# Patient Record
Sex: Female | Born: 1970
Health system: Southern US, Community
[De-identification: ages and names within clinical notes are randomized; demographics above are authoritative.]

## PROBLEM LIST (undated history)

## (undated) DIAGNOSIS — K5900 Constipation, unspecified: Secondary | ICD-10-CM

## (undated) DIAGNOSIS — M4846XA Fatigue fracture of vertebra, lumbar region, initial encounter for fracture: Secondary | ICD-10-CM

## (undated) DIAGNOSIS — I959 Hypotension, unspecified: Secondary | ICD-10-CM

## (undated) DIAGNOSIS — F419 Anxiety disorder, unspecified: Secondary | ICD-10-CM

## (undated) DIAGNOSIS — M1990 Unspecified osteoarthritis, unspecified site: Secondary | ICD-10-CM

## (undated) DIAGNOSIS — R32 Unspecified urinary incontinence: Secondary | ICD-10-CM

## (undated) DIAGNOSIS — F32A Depression, unspecified: Secondary | ICD-10-CM

## (undated) DIAGNOSIS — E669 Obesity, unspecified: Secondary | ICD-10-CM

## (undated) DIAGNOSIS — M719 Bursopathy, unspecified: Secondary | ICD-10-CM

## (undated) DIAGNOSIS — R600 Localized edema: Secondary | ICD-10-CM

## (undated) DIAGNOSIS — F329 Major depressive disorder, single episode, unspecified: Secondary | ICD-10-CM

## (undated) DIAGNOSIS — M810 Age-related osteoporosis without current pathological fracture: Secondary | ICD-10-CM

## (undated) DIAGNOSIS — R0602 Shortness of breath: Secondary | ICD-10-CM

## (undated) DIAGNOSIS — E785 Hyperlipidemia, unspecified: Secondary | ICD-10-CM

## (undated) DIAGNOSIS — K760 Fatty (change of) liver, not elsewhere classified: Secondary | ICD-10-CM

## (undated) DIAGNOSIS — R739 Hyperglycemia, unspecified: Secondary | ICD-10-CM

## (undated) HISTORY — DX: Bursopathy, unspecified: M71.9

## (undated) HISTORY — DX: Hyperlipidemia, unspecified: E78.5

## (undated) HISTORY — DX: Unspecified urinary incontinence: R32

## (undated) HISTORY — DX: Fatigue fracture of vertebra, lumbar region, initial encounter for fracture: M48.46XA

## (undated) HISTORY — DX: Unspecified osteoarthritis, unspecified site: M19.90

## (undated) HISTORY — DX: Obesity, unspecified: E66.9

## (undated) HISTORY — DX: Shortness of breath: R06.02

## (undated) HISTORY — DX: Depression, unspecified: F32.A

## (undated) HISTORY — DX: Hypotension, unspecified: I95.9

## (undated) HISTORY — DX: Constipation, unspecified: K59.00

## (undated) HISTORY — PX: COLONOSCOPY: SHX174

## (undated) HISTORY — DX: Localized edema: R60.0

## (undated) HISTORY — PX: TONSILLECTOMY: SUR1361

## (undated) HISTORY — PX: SINUS ENDO W/FUSION: SHX777

## (undated) HISTORY — DX: Fatty (change of) liver, not elsewhere classified: K76.0

## (undated) HISTORY — DX: Hyperglycemia, unspecified: R73.9

## (undated) HISTORY — DX: Major depressive disorder, single episode, unspecified: F32.9

## (undated) HISTORY — PX: RHINOPLASTY: SUR1284

---

## 1999-09-04 ENCOUNTER — Encounter: Admission: RE | Admit: 1999-09-04 | Discharge: 1999-12-03 | Payer: Self-pay | Admitting: Family Medicine

## 1999-12-08 ENCOUNTER — Encounter: Admission: RE | Admit: 1999-12-08 | Discharge: 2000-03-07 | Payer: Self-pay | Admitting: Family Medicine

## 2001-07-14 ENCOUNTER — Other Ambulatory Visit: Admission: RE | Admit: 2001-07-14 | Discharge: 2001-07-14 | Payer: Self-pay | Admitting: Family Medicine

## 2002-03-05 ENCOUNTER — Inpatient Hospital Stay (HOSPITAL_COMMUNITY): Admission: AD | Admit: 2002-03-05 | Discharge: 2002-03-05 | Payer: Self-pay | Admitting: Obstetrics and Gynecology

## 2002-03-14 ENCOUNTER — Inpatient Hospital Stay (HOSPITAL_COMMUNITY): Admission: AD | Admit: 2002-03-14 | Discharge: 2002-03-14 | Payer: Self-pay | Admitting: Obstetrics and Gynecology

## 2002-03-22 ENCOUNTER — Inpatient Hospital Stay (HOSPITAL_COMMUNITY): Admission: AD | Admit: 2002-03-22 | Discharge: 2002-03-22 | Payer: Self-pay | Admitting: Obstetrics and Gynecology

## 2002-10-08 ENCOUNTER — Inpatient Hospital Stay (HOSPITAL_COMMUNITY): Admission: AD | Admit: 2002-10-08 | Discharge: 2002-10-10 | Payer: Self-pay | Admitting: Obstetrics and Gynecology

## 2002-11-19 ENCOUNTER — Other Ambulatory Visit: Admission: RE | Admit: 2002-11-19 | Discharge: 2002-11-19 | Payer: Self-pay | Admitting: Obstetrics and Gynecology

## 2004-04-03 ENCOUNTER — Other Ambulatory Visit: Admission: RE | Admit: 2004-04-03 | Discharge: 2004-04-03 | Payer: Self-pay | Admitting: Family Medicine

## 2004-04-03 ENCOUNTER — Ambulatory Visit: Payer: Self-pay | Admitting: Family Medicine

## 2004-05-15 ENCOUNTER — Ambulatory Visit (HOSPITAL_BASED_OUTPATIENT_CLINIC_OR_DEPARTMENT_OTHER): Admission: RE | Admit: 2004-05-15 | Discharge: 2004-05-15 | Payer: Self-pay | Admitting: Family Medicine

## 2004-05-15 ENCOUNTER — Ambulatory Visit: Payer: Self-pay | Admitting: Pulmonary Disease

## 2004-05-29 ENCOUNTER — Ambulatory Visit: Payer: Self-pay | Admitting: Family Medicine

## 2004-08-28 ENCOUNTER — Ambulatory Visit: Payer: Self-pay | Admitting: Family Medicine

## 2004-11-27 ENCOUNTER — Ambulatory Visit: Payer: Self-pay | Admitting: Family Medicine

## 2004-12-29 ENCOUNTER — Ambulatory Visit: Payer: Self-pay | Admitting: Family Medicine

## 2005-02-15 ENCOUNTER — Ambulatory Visit: Payer: Self-pay | Admitting: Family Medicine

## 2005-04-07 ENCOUNTER — Ambulatory Visit: Payer: Self-pay | Admitting: Family Medicine

## 2005-04-07 ENCOUNTER — Encounter: Payer: Self-pay | Admitting: Family Medicine

## 2005-04-07 ENCOUNTER — Other Ambulatory Visit: Admission: RE | Admit: 2005-04-07 | Discharge: 2005-04-07 | Payer: Self-pay | Admitting: Family Medicine

## 2005-05-14 ENCOUNTER — Ambulatory Visit: Payer: Self-pay | Admitting: Family Medicine

## 2005-08-13 ENCOUNTER — Ambulatory Visit: Payer: Self-pay | Admitting: Family Medicine

## 2005-10-19 ENCOUNTER — Ambulatory Visit: Payer: Self-pay | Admitting: Family Medicine

## 2006-07-14 ENCOUNTER — Other Ambulatory Visit: Admission: RE | Admit: 2006-07-14 | Discharge: 2006-07-14 | Payer: Self-pay | Admitting: Family Medicine

## 2006-07-14 ENCOUNTER — Ambulatory Visit: Payer: Self-pay | Admitting: Family Medicine

## 2006-07-14 ENCOUNTER — Encounter: Payer: Self-pay | Admitting: Family Medicine

## 2006-07-14 LAB — CONVERTED CEMR LAB
ALT: 22 units/L (ref 0–40)
AST: 23 units/L (ref 0–37)
Albumin: 3.8 g/dL (ref 3.5–5.2)
Alkaline Phosphatase: 51 units/L (ref 39–117)
BUN: 18 mg/dL (ref 6–23)
Basophils Absolute: 0 10*3/uL (ref 0.0–0.1)
Basophils Relative: 0.5 % (ref 0.0–1.0)
Bilirubin, Direct: 0.1 mg/dL (ref 0.0–0.3)
CO2: 30 meq/L (ref 19–32)
Calcium: 8.6 mg/dL (ref 8.4–10.5)
Chloride: 109 meq/L (ref 96–112)
Cholesterol: 215 mg/dL (ref 0–200)
Creatinine, Ser: 0.7 mg/dL (ref 0.4–1.2)
Direct LDL: 130.8 mg/dL
Eosinophils Absolute: 0.1 10*3/uL (ref 0.0–0.6)
Eosinophils Relative: 3.3 % (ref 0.0–5.0)
GFR calc Af Amer: 121 mL/min
GFR calc non Af Amer: 100 mL/min
Glucose, Bld: 80 mg/dL (ref 70–99)
HCT: 38.4 % (ref 36.0–46.0)
HDL: 65.5 mg/dL (ref >39.0)
Hemoglobin: 13.1 g/dL (ref 12.0–15.0)
Lymphocytes Relative: 40.8 % (ref 12.0–46.0)
MCHC: 34 g/dL (ref 30.0–36.0)
MCV: 86 fL (ref 78.0–100.0)
Monocytes Absolute: 0.2 10*3/uL (ref 0.2–0.7)
Monocytes Relative: 8.8 % (ref 3.0–11.0)
Neutro Abs: 1.3 10*3/uL — ABNORMAL LOW (ref 1.4–7.7)
Neutrophils Relative %: 46.6 % (ref 43.0–77.0)
Platelets: 233 10*3/uL (ref 150–400)
Potassium: 4 meq/L (ref 3.5–5.1)
RBC: 4.46 M/uL (ref 3.87–5.11)
RDW: 14.3 % (ref 11.5–14.6)
Sodium: 143 meq/L (ref 135–145)
TSH: 1.55 microintl units/mL (ref 0.35–5.50)
Total Bilirubin: 0.6 mg/dL (ref 0.3–1.2)
Total CHOL/HDL Ratio: 3.3
Total Protein: 6 g/dL (ref 6.0–8.3)
Triglycerides: 60 mg/dL (ref 0–149)
VLDL: 12 mg/dL (ref 0–40)
WBC: 2.7 10*3/uL — ABNORMAL LOW (ref 4.5–10.5)

## 2006-08-08 ENCOUNTER — Ambulatory Visit: Payer: Self-pay | Admitting: Family Medicine

## 2006-08-23 DIAGNOSIS — Z9889 Other specified postprocedural states: Secondary | ICD-10-CM | POA: Insufficient documentation

## 2006-08-23 DIAGNOSIS — G2581 Restless legs syndrome: Secondary | ICD-10-CM | POA: Insufficient documentation

## 2006-08-23 DIAGNOSIS — Z9089 Acquired absence of other organs: Secondary | ICD-10-CM | POA: Insufficient documentation

## 2006-08-23 DIAGNOSIS — F509 Eating disorder, unspecified: Secondary | ICD-10-CM | POA: Insufficient documentation

## 2006-11-02 ENCOUNTER — Ambulatory Visit: Payer: Self-pay | Admitting: Family Medicine

## 2007-02-16 ENCOUNTER — Ambulatory Visit: Payer: Self-pay | Admitting: Family Medicine

## 2007-02-16 DIAGNOSIS — J019 Acute sinusitis, unspecified: Secondary | ICD-10-CM | POA: Insufficient documentation

## 2007-05-12 ENCOUNTER — Ambulatory Visit: Payer: Self-pay | Admitting: Family Medicine

## 2007-07-17 ENCOUNTER — Telehealth: Payer: Self-pay | Admitting: Family Medicine

## 2007-07-28 ENCOUNTER — Ambulatory Visit: Payer: Self-pay | Admitting: Family Medicine

## 2007-10-26 ENCOUNTER — Ambulatory Visit: Payer: Self-pay | Admitting: Family Medicine

## 2008-01-17 ENCOUNTER — Ambulatory Visit: Payer: Self-pay | Admitting: Family Medicine

## 2008-04-12 ENCOUNTER — Ambulatory Visit: Payer: Self-pay | Admitting: Family Medicine

## 2008-05-31 ENCOUNTER — Ambulatory Visit: Payer: Self-pay | Admitting: Family Medicine

## 2008-05-31 ENCOUNTER — Other Ambulatory Visit: Admission: RE | Admit: 2008-05-31 | Discharge: 2008-05-31 | Payer: Self-pay | Admitting: Family Medicine

## 2008-05-31 ENCOUNTER — Encounter: Payer: Self-pay | Admitting: Family Medicine

## 2008-05-31 DIAGNOSIS — F3289 Other specified depressive episodes: Secondary | ICD-10-CM | POA: Insufficient documentation

## 2008-05-31 DIAGNOSIS — F329 Major depressive disorder, single episode, unspecified: Secondary | ICD-10-CM | POA: Insufficient documentation

## 2008-06-05 ENCOUNTER — Encounter (INDEPENDENT_AMBULATORY_CARE_PROVIDER_SITE_OTHER): Payer: Self-pay | Admitting: *Deleted

## 2008-06-17 LAB — CONVERTED CEMR LAB
Albumin: 4.1 g/dL (ref 3.5–5.2)
Alkaline Phosphatase: 59 units/L (ref 39–117)
BUN: 12 mg/dL (ref 6–23)
Basophils Absolute: 0 10*3/uL (ref 0.0–0.1)
Bilirubin, Direct: 0.1 mg/dL (ref 0.0–0.3)
CO2: 28 meq/L (ref 19–32)
Chloride: 107 meq/L (ref 96–112)
Eosinophils Absolute: 0.1 10*3/uL (ref 0.0–0.7)
Eosinophils Relative: 2 % (ref 0.0–5.0)
GFR calc Af Amer: 103 mL/min
GFR calc non Af Amer: 85 mL/min
Glucose, Bld: 92 mg/dL (ref 70–99)
HDL: 49.6 mg/dL (ref >39.0)
MCV: 90.5 fL (ref 78.0–100.0)
Monocytes Absolute: 0.3 10*3/uL (ref 0.1–1.0)
Neutro Abs: 2.2 10*3/uL (ref 1.4–7.7)
Neutrophils Relative %: 52.5 % (ref 43.0–77.0)
RDW: 12.2 % (ref 11.5–14.6)
TSH: 1.17 microintl units/mL (ref 0.35–5.50)
Total CHOL/HDL Ratio: 3.6
Total Protein: 6.4 g/dL (ref 6.0–8.3)
VLDL: 9 mg/dL (ref 0–40)
WBC: 4.1 10*3/uL — ABNORMAL LOW (ref 4.5–10.5)

## 2008-06-18 ENCOUNTER — Encounter (INDEPENDENT_AMBULATORY_CARE_PROVIDER_SITE_OTHER): Payer: Self-pay | Admitting: *Deleted

## 2008-07-12 ENCOUNTER — Ambulatory Visit: Payer: Self-pay | Admitting: Family Medicine

## 2008-10-25 ENCOUNTER — Ambulatory Visit: Payer: Self-pay | Admitting: Family Medicine

## 2009-01-17 ENCOUNTER — Ambulatory Visit: Payer: Self-pay | Admitting: Family Medicine

## 2009-04-23 ENCOUNTER — Ambulatory Visit: Payer: Self-pay | Admitting: Family Medicine

## 2009-07-13 ENCOUNTER — Emergency Department (HOSPITAL_COMMUNITY): Admission: EM | Admit: 2009-07-13 | Discharge: 2009-07-13 | Payer: Self-pay | Admitting: Emergency Medicine

## 2009-08-01 ENCOUNTER — Ambulatory Visit: Payer: Self-pay | Admitting: Family Medicine

## 2009-08-01 LAB — CONVERTED CEMR LAB: Beta hcg, urine, semiquantitative: NEGATIVE

## 2009-09-05 ENCOUNTER — Other Ambulatory Visit: Admission: RE | Admit: 2009-09-05 | Discharge: 2009-09-05 | Payer: Self-pay | Admitting: Family Medicine

## 2009-09-05 ENCOUNTER — Ambulatory Visit: Payer: Self-pay | Admitting: Family Medicine

## 2009-09-05 ENCOUNTER — Encounter (INDEPENDENT_AMBULATORY_CARE_PROVIDER_SITE_OTHER): Payer: Self-pay | Admitting: *Deleted

## 2009-09-05 DIAGNOSIS — N393 Stress incontinence (female) (male): Secondary | ICD-10-CM | POA: Insufficient documentation

## 2009-09-05 LAB — CONVERTED CEMR LAB
Blood in Urine, dipstick: NEGATIVE
Urobilinogen, UA: NEGATIVE
WBC Urine, dipstick: NEGATIVE
pH: 5

## 2009-09-08 LAB — CONVERTED CEMR LAB
ALT: 24 units/L (ref 0–35)
Albumin: 4.3 g/dL (ref 3.5–5.2)
BUN: 14 mg/dL (ref 6–23)
Basophils Absolute: 0 10*3/uL (ref 0.0–0.1)
Basophils Relative: 0.1 % (ref 0.0–3.0)
Bilirubin, Direct: 0.2 mg/dL (ref 0.0–0.3)
Calcium: 9.2 mg/dL (ref 8.4–10.5)
Chloride: 105 meq/L (ref 96–112)
Creatinine, Ser: 0.8 mg/dL (ref 0.4–1.2)
Eosinophils Relative: 0.9 % (ref 0.0–5.0)
Hemoglobin: 14.5 g/dL (ref 12.0–15.0)
Lymphocytes Relative: 26.6 % (ref 12.0–46.0)
Lymphs Abs: 1.3 10*3/uL (ref 0.7–4.0)
Monocytes Absolute: 0.3 10*3/uL (ref 0.1–1.0)
Neutro Abs: 3.2 10*3/uL (ref 1.4–7.7)
Platelets: 226 10*3/uL (ref 150.0–400.0)
Potassium: 3.9 meq/L (ref 3.5–5.1)
RDW: 13.3 % (ref 11.5–14.6)
TSH: 1.52 microintl units/mL (ref 0.35–5.50)
Total Bilirubin: 0.7 mg/dL (ref 0.3–1.2)
Total Protein: 6.5 g/dL (ref 6.0–8.3)
WBC: 4.8 10*3/uL (ref 4.5–10.5)

## 2009-09-09 ENCOUNTER — Encounter (INDEPENDENT_AMBULATORY_CARE_PROVIDER_SITE_OTHER): Payer: Self-pay | Admitting: *Deleted

## 2009-10-07 ENCOUNTER — Ambulatory Visit: Payer: Self-pay | Admitting: Family Medicine

## 2009-10-23 ENCOUNTER — Ambulatory Visit: Payer: Self-pay | Admitting: Family Medicine

## 2010-01-22 ENCOUNTER — Ambulatory Visit: Payer: Self-pay | Admitting: Family Medicine

## 2010-04-18 ENCOUNTER — Emergency Department (HOSPITAL_COMMUNITY)
Admission: EM | Admit: 2010-04-18 | Discharge: 2010-04-18 | Payer: Self-pay | Source: Home / Self Care | Admitting: Family Medicine

## 2010-04-22 ENCOUNTER — Ambulatory Visit: Payer: Self-pay | Admitting: Family Medicine

## 2010-05-07 ENCOUNTER — Encounter
Admission: RE | Admit: 2010-05-07 | Discharge: 2010-05-07 | Payer: Self-pay | Source: Home / Self Care | Attending: Family Medicine | Admitting: Family Medicine

## 2010-05-24 ENCOUNTER — Encounter: Payer: Self-pay | Admitting: Family Medicine

## 2010-06-02 NOTE — Letter (Signed)
Summary: Results Follow up Letter  [facility] [place]  [address] [place], [place] [address]   Phone: [phone]  Fax: [phone]    09/09/2009 MRN: [id number]  Brooke [name] [address] [place], [place]  [address]  Dear Ms. NEILS,  The following are the results of your recent test(s):  Test         Result    Pap Smear:        Normal __X___  Not Normal _____ Comments: ______________________________________________________ Cholesterol: LDL(Bad cholesterol):         Your goal is less than:         HDL (Good cholesterol):       Your goal is more than: Comments:  ______________________________________________________ Mammogram:        Normal _____  Not Normal _____ Comments:  ___________________________________________________________________ Hemoccult:        Normal _____  Not normal _______ Comments:    _____________________________________________________________________ Other Tests:    We routinely do not discuss normal results over the telephone.  If you desire a copy of the results, or you have any questions about this information we can discuss them at your next office visit.   Sincerely,    [name] CMA  Sep 09, 2009 4:12 PM

## 2010-06-02 NOTE — Assessment & Plan Note (Signed)
Summary: depo inj//lch  Nurse Visit   Allergies: No Known Drug Allergies Laboratory Results   Urine Tests      Urine HCG: negative    Medication Administration  Injection # 1:    Medication: Depo-Provera 150mg     Diagnosis: CONTRACEPTIVE PRESCRIPTION, MEASURE NEC (ICD-V25.02)    Route: IM    Site: L deltoid    Exp Date: 07/2011    Lot #: [id number]    Mfr: Hollie Beach    Patient tolerated injection without complications    Given by: [name] CMA (August 01, 2009 4:00 PM)

## 2010-06-02 NOTE — Assessment & Plan Note (Signed)
Summary: DEPO SHOT//PH  Nurse Visit    Allergies: No Known Drug Allergies  Medication Administration  Injection # 1:    Medication: Depo-Provera 150mg     Diagnosis: CONTRACEPTIVE PRESCRIPTION, MEASURE NEC (ICD-V25.02)    Route: IM    Site: R deltoid    Exp Date: 06/03/2010    Lot #: [id number]    Mfr: teva    Given by: Almeta Monas CMA Duncan Dull) (January 22, 2010 3:57 PM)  Orders Added: 1)  Depo-Provera 150mg  [J1055] 2)  Admin of Therapeutic Inj  intramuscular or subcutaneous [84132]

## 2010-06-02 NOTE — Letter (Signed)
Summary: [facility]: Immunoassay Fecal Occult Blood (iFOB) Order Form  [facility] [place]  [address] [place], [place] [address]   Phone: [phone]  Fax: [phone]      [facility]: Immunoassay Fecal Occult Blood (iFOB) Order Form   Sep 05, 2009 MRN: [id number]   Brooke [name] 17-Mar-1971   Physicican Name:____Yvonne Laury Axon, DO_____________________  Diagnosis Code:______v76.51____________________      [name] CMA

## 2010-06-02 NOTE — Assessment & Plan Note (Signed)
Summary: cpx//lch   Vital Signs:  Patient profile:   40 year old female Height:      63.25 inches Weight:      136 pounds BMI:     23.99 Pulse rate:   80 / minute Pulse rhythm:   regular BP sitting:   100 / 60  (left arm) Cuff size:   regular  Vitals Entered By: [name] CMA (Sep 05, 2009 12:45 PM) CC: CPX,pap   History of Present Illness: Pt here for cpe , pap and labs.  Pt only complaint is stress incontinence--- she leaks urine with coughing or sneezing.  Pt has never tried anything before----symptoms for few years.    Preventive Screening-Counseling & Management  Alcohol-Tobacco     Alcohol drinks/day: 0     Smoking Status: never  Caffeine-Diet-Exercise     Caffeine use/day: 1-2     Does Patient Exercise: no     Exercise Counseling: to improve exercise regimen  Hep-HIV-STD-Contraception     Dental Visit-last 6 months yes     Dental Care Counseling: not indicated; dental care within six months     SBE monthly: yes     SBE Education/Counseling: not indicated; SBE done regularly      Sexual History:  currently monogamous.        Drug Use:  never.    Current Medications (verified): 1)  Zolpidem Tartrate 10 Mg  Tabs (Zolpidem Tartrate) .... Take 1 One  Tablet(S) Each Night At Bedtime 2)  Depo-Provera 150 Mg/ml Im Susp (Medroxyprogesterone Acetate) .Marland Kitchen.. 1 Injection Im Once Every 3 Months 3)  Cymbalta 30 Mg Cpep (Duloxetine Hcl) .... 3 By Mouth Daily 4)  Abilify 5 Mg Tabs (Aripiprazole) .Marland Kitchen.. 1 By Mouth Daily 5)  Vesicare 10 Mg Tabs (Solifenacin Succinate) .Marland Kitchen.. 1 By Mouth Once Daily  Allergies (verified): No Known Drug Allergies  Past History:  Past Medical History: Last updated: 05/31/2008 Depression  Past Surgical History: Last updated: 05/31/2008 Sinus surgery Tonsillectomy deviated septum  Family History: Last updated: 05/31/2008 Family History Diabetes 1st degree relative Family History High cholesterol Family History Depression/  bipolar  Social History: Last updated: 05/31/2008 Occupation:  [name]  Married Never Smoked Alcohol use-no Drug use-no Regular exercise-no  Risk Factors: Alcohol Use: 0 (09/05/2009) Caffeine Use: 1-2 (09/05/2009) Exercise: no (09/05/2009)  Risk Factors: Smoking Status: never (09/05/2009)  Family History: Reviewed history from 05/31/2008 and no changes required. Family History Diabetes 1st degree relative Family History High cholesterol Family History Depression/ bipolar  Social History: Reviewed history from 05/31/2008 and no changes required. Occupation:  [name]  Married Never Smoked Alcohol use-no Drug use-no Regular exercise-no Caffeine use/day:  1-2 Dental Care w/in 6 mos.:  yes Sexual History:  currently monogamous Drug Use:  never  Review of Systems      See HPI General:  Denies chills, fatigue, fever, loss of appetite, malaise, sleep disorder, sweats, weakness, and weight loss. Eyes:  Denies blurring, discharge, double vision, eye irritation, eye pain, halos, itching, light sensitivity, red eye, vision loss-1 eye, and vision loss-both eyes; optho q2y. ENT:  Denies decreased hearing, difficulty swallowing, ear discharge, earache, hoarseness, nasal congestion, nosebleeds, postnasal drainage, ringing in ears, sinus pressure, and sore throat. CV:  Denies bluish discoloration of lips or nails, chest pain or discomfort, difficulty breathing at night, difficulty breathing while lying down, fainting, fatigue, leg cramps with exertion, lightheadness, near fainting, palpitations, shortness of breath with exertion, swelling of feet, swelling of hands, and weight  gain. Resp:  Denies chest discomfort, chest pain with inspiration, cough, coughing up blood, excessive snoring, hypersomnolence, morning headaches, pleuritic, shortness of breath, sputum productive, and wheezing. GI:  Denies abdominal pain, bloody stools, change in bowel habits, constipation, dark  tarry stools, diarrhea, excessive appetite, gas, hemorrhoids, indigestion, loss of appetite, nausea, vomiting, vomiting blood, and yellowish skin color. GU:  Denies abnormal vaginal bleeding, decreased libido, discharge, dysuria, genital sores, hematuria, incontinence, nocturia, urinary frequency, and urinary hesitancy. MS:  Denies joint pain, joint redness, joint swelling, loss of strength, low back pain, mid back pain, muscle aches, muscle , cramps, muscle weakness, stiffness, and thoracic pain. Derm:  Denies changes in color of skin, changes in nail beds, dryness, excessive perspiration, flushing, hair loss, insect bite(s), itching, lesion(s), poor wound healing, and rash. Neuro:  Denies brief paralysis, difficulty with concentration, disturbances in coordination, falling down, headaches, inability to speak, memory loss, numbness, poor balance, seizures, sensation of room spinning, tingling, tremors, visual disturbances, and weakness. Psych:  Denies alternate hallucination ( auditory/visual), anxiety, depression, easily angered, easily tearful, irritability, mental problems, panic attacks, sense of great danger, suicidal thoughts/plans, thoughts of violence, unusual visions or sounds, and thoughts /plans of harming others; psych--cottle. Endo:  Denies cold intolerance, excessive hunger, excessive thirst, excessive urination, heat intolerance, polyuria, and weight change. Heme:  Denies abnormal bruising, bleeding, enlarge lymph nodes, fevers, pallor, and skin discoloration. Allergy:  Denies hives or rash, itching eyes, persistent infections, seasonal allergies, and sneezing.  Physical Exam  General:  Well-developed,well-nourished,in no acute distress; alert,appropriate and cooperative throughout examination Head:  Normocephalic and atraumatic without obvious abnormalities. No apparent alopecia or balding. Eyes:  pupils equal, pupils round, pupils reactive to light, and no injection.   Ears:   External ear exam shows no significant lesions or deformities.  Otoscopic examination reveals clear canals, tympanic membranes are intact bilaterally without bulging, retraction, inflammation or discharge. Hearing is grossly normal bilaterally. Nose:  External nasal examination shows no deformity or inflammation. Nasal mucosa are pink and moist without lesions or exudates. Mouth:  Oral mucosa and oropharynx without lesions or exudates.  Teeth in good repair. Neck:  No deformities, masses, or tenderness noted. Chest Wall:  No deformities, masses, or tenderness noted. Breasts:  No mass, nodules, thickening, tenderness, bulging, retraction, inflamation, nipple discharge or skin changes noted.   Lungs:  Normal respiratory effort, chest expands symmetrically. Lungs are clear to auscultation, no crackles or wheezes. Heart:  normal rate and no murmur.   Abdomen:  Bowel sounds positive,abdomen soft and non-tender without masses, organomegaly or hernias noted. Rectal:  No external abnormalities noted. Normal sphincter tone. No rectal masses or tenderness. Genitalia:  Normal introitus for age, no external lesions, no vaginal discharge, mucosa pink and moist, no vaginal or cervical lesions, no vaginal atrophy, no friaility or hemorrhage, normal uterus size and position, no adnexal masses or tenderness + CYSTOCELE Msk:  normal ROM, no joint tenderness, no joint swelling, no joint warmth, no redness over joints, no joint deformities, no joint instability, and no crepitation.   Pulses:  R posterior tibial normal, R dorsalis pedis normal, R carotid normal, L posterior tibial normal, L dorsalis pedis normal, and L carotid normal.   Extremities:  No clubbing, cyanosis, edema, or deformity noted with normal full range of motion of all joints.   Neurologic:  No cranial nerve deficits noted. Station and gait are normal. Plantar reflexes are down-going bilaterally. DTRs are symmetrical throughout. Sensory, motor and  coordinative functions appear intact. Skin:  Intact  without suspicious lesions or rashes Cervical Nodes:  No lymphadenopathy noted Axillary Nodes:  No palpable lymphadenopathy Psych:  Cognition and judgment appear intact. Alert and cooperative with normal attention span and concentration. No apparent delusions, illusions, hallucinations   Impression & Recommendations:  Problem # 1:  PREVENTIVE HEALTH CARE (ICD-V70.0)  Orders: Radiology Referral (Radiology) Venipuncture ([address]) TLB-Lipid Panel (80061-LIPID) TLB-BMP (Basic Metabolic Panel-BMET) (80048-METABOL) TLB-CBC Platelet - w/Differential (85025-CBCD) TLB-Hepatic/Liver Function Pnl (80076-HEPATIC) TLB-TSH (Thyroid Stimulating Hormone) (84443-TSH) UA Dipstick w/o Micro (manual) (60454) EKG w/ Interpretation (93000) UA Dipstick w/o Micro (manual) (09811)  Problem # 2:  FEMALE STRESS INCONTINENCE (ICD-625.6)  VESICARE 10 MG once daily   Orders: UA Dipstick w/o Micro (manual) (91478) EKG w/ Interpretation (93000) UA Dipstick w/o Micro (manual) (29562)  Complete Medication List: 1)  Zolpidem Tartrate 10 Mg Tabs (Zolpidem tartrate) .... Take 1 one  tablet(s) each night at bedtime 2)  Depo-provera 150 Mg/ml Im Susp (Medroxyprogesterone acetate) .Marland Kitchen.. 1 injection im once every 3 months 3)  Cymbalta 30 Mg Cpep (Duloxetine hcl) .... 3 by mouth daily 4)  Abilify 5 Mg Tabs (Aripiprazole) .Marland Kitchen.. 1 by mouth daily 5)  Vesicare 10 Mg Tabs (Solifenacin succinate) .Marland Kitchen.. 1 by mouth once daily   EKG  Procedure date:  09/05/2009  Findings:      Normal sinus rhythm with rate of:  79 bpm   Laboratory Results   Urine Tests   Date/Time Reported: Sep 05, 2009 1:48 PM   Routine Urinalysis   Color: yellow Appearance: Clear Glucose: negative   (Normal Range: Negative) Bilirubin: negative   (Normal Range: Negative) Ketone: moderate (40)   (Normal Range: Negative) Spec. Gravity: <1.005   (Normal Range: 1.003-1.035) Blood: negative    (Normal Range: Negative) pH: 5.0   (Normal Range: 5.0-8.0) Protein: negative   (Normal Range: Negative) Urobilinogen: negative   (Normal Range: 0-1) Nitrite: negative   (Normal Range: Negative) Leukocyte Esterace: negative   (Normal Range: Negative)    Comments: Floydene Flock  Sep 05, 2009 1:48 PM

## 2010-06-02 NOTE — Assessment & Plan Note (Signed)
Summary: DEPO SHOT--PH  Nurse Visit   Allergies: No Known Drug Allergies  Medication Administration  Injection # 1:    Medication: Depo-Provera 150mg     Diagnosis: CONTRACEPTIVE PRESCRIPTION, MEASURE NEC (ICD-V25.02)    Route: IM    Site: R deltoid    Exp Date: 06/2010    Lot #: [id number]    Mfr: teva    Patient tolerated injection without complications    Given by: [name] CMA (October 23, 2009 3:47 PM)  Orders Added: 1)  Admin of Therapeutic Inj  intramuscular or subcutaneous [96372] 2)  Depo-Provera 150mg  [J1055]

## 2010-06-04 NOTE — Assessment & Plan Note (Signed)
Summary: SINUS/KN   Vital Signs:  Patient profile:   40 year old female Weight:      138 pounds BMI:     24.34 Temp:     97.5 degrees F oral BP sitting:   104 / 68  (left arm)  Vitals Entered By: Doristine Devoid CMA (April 21, 2010 11:28 AM) CC: sinus congestion and cough    History of Present Illness: 40 yo woman here today for ? sinus infxn.  + facial pain, sinus congestion, sore throat, productive cough, pressure in ears.  no fevers.  + sick contacts.  sxs started 1 week ago.  + hoarseness.  Current Medications (verified): 1)  Zolpidem Tartrate 10 Mg  Tabs (Zolpidem Tartrate) .... Take 1 One  Tablet(S) Each Night At Bedtime 2)  Depo-Provera 150 Mg/ml Im Susp (Medroxyprogesterone Acetate) .Marland Kitchen.. 1 Injection Im Once Every 3 Months 3)  Cymbalta 30 Mg Cpep (Duloxetine Hcl) .... 3 By Mouth Daily 4)  Abilify 5 Mg Tabs (Aripiprazole) .Marland Kitchen.. 1 By Mouth Daily 5)  Vesicare 10 Mg Tabs (Solifenacin Succinate) .Marland Kitchen.. 1 By Mouth Once Daily  Allergies (verified): No Known Drug Allergies  Review of Systems      See HPI  Physical Exam  General:  Well-developed,well-nourished,in no acute distress; alert,appropriate and cooperative throughout examination Head:  NCAT, + TTP over maxillary sinuses Eyes:  no injxn or inflammation Ears:  + TM retraction bilaterally Nose:  mild congestion Mouth:  +PND, otherwise normal Neck:  No deformities, masses, or tenderness noted. Lungs:  Normal respiratory effort, chest expands symmetrically. Lungs are clear to auscultation, no crackles or wheezes. Heart:  normal rate and no murmur.     Impression & Recommendations:  Problem # 1:  SINUSITIS, ACUTE NOS (ICD-461.9) Assessment Unchanged pt's sxs consistent w/ infxn.  start amox.  cough meds as needed.  reviewed supportive care and red flags that should prompt return.  Pt expresses understanding and is in agreement w/ this plan. Her updated medication list for this problem includes:    Amoxicillin 500 Mg  Tabs (Amoxicillin) .Marland Kitchen... 2 tabs by mouth two times a day x10 days    Tessalon 200 Mg Caps (Benzonatate) .Marland Kitchen... Take one capsule by mouth three times a day as needed for cough    Cheratussin Ac 100-10 Mg/57ml Syrp (Guaifenesin-codeine) .Marland Kitchen... 1-2 tsps q4-6 as needed for cough.  will cause drowsiness  Complete Medication List: 1)  Zolpidem Tartrate 10 Mg Tabs (Zolpidem tartrate) .... Take 1 one  tablet(s) each night at bedtime 2)  Depo-provera 150 Mg/ml Im Susp (Medroxyprogesterone acetate) .Marland Kitchen.. 1 injection im once every 3 months 3)  Cymbalta 30 Mg Cpep (Duloxetine hcl) .... 3 by mouth daily 4)  Abilify 5 Mg Tabs (Aripiprazole) .Marland Kitchen.. 1 by mouth daily 5)  Vesicare 10 Mg Tabs (Solifenacin succinate) .Marland Kitchen.. 1 by mouth once daily 6)  Amoxicillin 500 Mg Tabs (Amoxicillin) .... 2 tabs by mouth two times a day x10 days 7)  Tessalon 200 Mg Caps (Benzonatate) .... Take one capsule by mouth three times a day as needed for cough 8)  Cheratussin Ac 100-10 Mg/40ml Syrp (Guaifenesin-codeine) .Marland Kitchen.. 1-2 tsps q4-6 as needed for cough.  will cause drowsiness  Patient Instructions: 1)  You have a sinus infection 2)  Take the Amoxicillin as directed- take w/ food to avoid upset stomach 3)  Drink plenty of fluids 4)  REST! 5)  Use the cough syrup at night and the cough pills for day 6)  Add mucinex to thin your  congestion 7)  Call w/ any questions or concerns 8)  Hang in there! 9)  Happy Holidays! Prescriptions: CHERATUSSIN AC 100-10 MG/5ML SYRP (GUAIFENESIN-CODEINE) 1-2 tsps Q4-6 as needed for cough.  will cause drowsiness  #150 x 0   Entered and Authorized by:   Neena Rhymes MD   Signed by:   Neena Rhymes MD on 04/21/2010   Method used:   Print then Give to Patient   RxID:   [id number] TESSALON 200 MG CAPS (BENZONATATE) Take one capsule by mouth three times a day as needed for cough  #60 x 0   Entered and Authorized by:   Neena Rhymes MD   Signed by:   Neena Rhymes MD on 04/21/2010   Method  used:   Electronically to        [facility] (retail)       [address]       [place], [place]  [address]       Ph: [phone]       Fax: [phone]   RxID:   [id number] AMOXICILLIN 500 MG TABS (AMOXICILLIN) 2 tabs by mouth two times a day x10 days  #40 x 0   Entered and Authorized by:   Neena Rhymes MD   Signed by:   Neena Rhymes MD on 04/21/2010   Method used:   Electronically to        [facility] (retail)       [address]       [place], [place]  [address]       Ph: [phone]       Fax: [phone]   RxID:   [id number]    Medication Administration  Injection # 1:    Medication: Depo-Provera 150mg     Diagnosis: CONTRACEPTIVE PRESCRIPTION, MEASURE NEC (ICD-V25.02)    Route: IM    Site: R deltoid    Exp Date: 08/31/2012    Lot #: [id number]    Mfr: Francisca December    Given by: Doristine Devoid CMA (April 21, 2010 11:40 AM)  Orders Added: 1)  Est. Patient Level III [32202]     Medication Administration  Injection # 1:    Medication: Depo-Provera 150mg     Diagnosis: CONTRACEPTIVE PRESCRIPTION, MEASURE NEC (ICD-V25.02)    Route: IM    Site: R deltoid    Exp Date: 08/31/2012    Lot #: [id number]    Mfr: Francisca December    Given by: Doristine Devoid CMA (April 21, 2010 11:40 AM)  Orders Added: 1)  Est. Patient Level III [23[address]]

## 2010-07-07 ENCOUNTER — Ambulatory Visit (INDEPENDENT_AMBULATORY_CARE_PROVIDER_SITE_OTHER): Payer: Commercial Managed Care - PPO

## 2010-07-07 ENCOUNTER — Encounter: Payer: Self-pay | Admitting: Family Medicine

## 2010-07-07 DIAGNOSIS — Z3009 Encounter for other general counseling and advice on contraception: Secondary | ICD-10-CM

## 2010-07-14 NOTE — Assessment & Plan Note (Signed)
Summary: depo shot--ph  Nurse Visit   Allergies: No Known Drug Allergies  Medication Administration  Injection # 1:    Medication: Depo-Provera 150mg     Diagnosis: CONTRACEPTIVE PRESCRIPTION, MEASURE NEC (ICD-V25.02)    Route: IM    Site: R deltoid    Exp Date: 03/03/2013    Lot #: [id number]    Mfr: [email or web address]    Patient tolerated injection without complications    Given by: Jeremy Johann CMA (July 07, 2010 10:42 AM)  Orders Added: 1)  Depo-Provera 150mg  [J1055] 2)  Admin of Therapeutic Inj  intramuscular or subcutaneous [04540]

## 2010-09-18 NOTE — Procedures (Signed)
Brooke [name], Brooke [name] NO.:  [id number]   MEDICAL RECORD NO.:  [id number]          PATIENT TYPE:  OUT   LOCATION:  SLEEP CENTER                 FACILITY:  [facility]   PHYSICIAN:  Marcelyn Bruins, M.D. [facility] DATE OF BIRTH:  Nov 14, 1970   DATE OF STUDY:  05/15/2004                              NOCTURNAL POLYSOMNOGRAM   REFERRING PHYSICIAN:  Dr. Lutricia Horsfall.   INDICATION FOR THE STUDY:  307.47 other dysfunctions of sleep stages or  arousal from sleep. Epworth score is 16.   SLEEP ARCHITECTURE:  The patient had a total sleep time 387 minutes with  very decreased REM but normal slow wave sleep. Sleep onset latency was  normal as was REM onset.   IMPRESSION:  1.  Small numbers of obstructive hypopneas which did not meet the      respiratory disturbance index criteria for the obstructive sleep apnea      syndrome. There is only mild snoring noted at best during the study.      There was nothing to indicate the possibility of the upper airway      resistance syndrome.  2.  No clinically significant cardiac arrhythmias.  3.  Large numbers of leg jerks with significant sleep disruption. I suspect      this may be the etiology of the patient's disturbed sleep; however,      clinical correlation is suggested. Would consider the diagnosis of the      restless legs syndrome or possibly the periodic leg movement syndrome,      both of which can be treated with Requip.      KC/MEDQ  D:  05/26/2004 10:14:00  T:  05/26/2004 15:43:24  Job:  [id number]

## 2010-09-18 NOTE — H&P (Signed)
NAME:  Brooke [name], Brooke [name]                     ACCOUNT NO.:  [id number]   MEDICAL RECORD NO.:  [id number]                   PATIENT TYPE:  INP   LOCATION:  9164                                 FACILITY:  [facility]   PHYSICIAN:  Crist Fat. Rivard, M.D.              DATE OF BIRTH:  [birth year]   DATE OF ADMISSION:  DATE OF DISCHARGE:                                HISTORY & PHYSICAL   HISTORY OF PRESENT ILLNESS:  Miss Matera is a 40 year old gravida 3, para  2, 0-0-2, at 39-1/7 weeks who presented to the maternity admissions unit  with spontaneous rupture of membranes at approximately 7:40 p.m. with clear  fluid reported. Uterine contractions at this time were every 3 to 4 minutes.  The patient had been 3 cm in the office earlier at the end of last week.   The pregnancy had been remarkable for:  1. History of rapid labor.  2. History of LGA.  3. History of depression.  4. History of pyelonephritis.  5. History severe nausea and vomiting first trimester.   Prenatal labs:  Blood type O positive, Rh antibody negative. VDRL  nonreactive. Rubella titer positive. Hepatitis B surface antigen negative.  HIV nonreactive. GC and Chlamydia cultures were negative. Pap smear normal.  Glucose challenge was normal. AFP was normal. Hemoglobin upon entering the  practice was 13; it was 11.6 at 26 weeks. Group B Strep culture was negative  at 36 weeks. TSH was normal in the first trimester.   An EDC of October 14, 2002, was established by the last menstrual period and  was in agreement with ultrasound at approximately 10 weeks.   HISTORY OF PRESENT PREGNANCY:  The patient entered care at approximately 9  weeks. She had severe nausea and vomiting in early pregnancy and required  several maternity admission visits with Phenergan and Reglan given. She was  also on Paxil in early pregnancy. This was changed to Zoloft. She had been  followed  by Dr. Jennelle Human and Dr. Ruthine Dose.   She began to feel better at  approximately 10 weeks but then had a relapse at  approximately 11 weeks with IV fluids given. This did begin to improve at  approximately 18 weeks. She was continued on Zoloft. She had a 1 hour  Glucola at 18 weeks which was normal. She had another Glucola at 26 weeks  which was normal. She did have significant  insomnia during her pregnancy.   The rest of her pregnancy was essentially uncomplicated. She did use Ambien  in the third trimester secondary to that  insomnia. She had been 3 cm in the  office last week.   OBSTETRIC HISTORY:  In 1993 the patient had a vaginal birth of a female  infant, weight 9 pounds, 7 ounces at [redacted] weeks gestation. She was in labor 12  hours. She had IV medication. That was a forceps delivery. In 1997 she had a  vaginal birth  of a female infant, weight 6 pounds, 5 ounces at 39 weeks. She  was in labor 8 hours. She was admitted to the  hospital  at 8 cm and  delivered 30 minutes later. She did have a history of post partum depression  in her previous  pregnancies, although she did not have any treatment.   PAST MEDICAL HISTORY:  She is a previous condom user. She was on Depo-  Provera until March of 2003. She was on oral contraceptives briefly. She  reports the usual childhood illnesses. She had a history of pyelonephritis 2  years ago which was treated as an outpatient. She does have a history of  chronic  depression and has been treated by Dr. Ruthine Dose and Dr. Jennelle Human.   PAST SURGICAL HISTORY:  Includes tonsils, wisdom teeth, deviated septum  repair and sinus surgery. Her only other  hospitalization  was for child  birth.   ALLERGIES:  None.   FAMILY HISTORY:  Paternal aunt had a heart attack and is now deceased. Her  father and paternal uncles have hypertension. Her maternal aunt had  phlebitis. Her maternal grandmother had severe anemia and was treated with  blood transfusions. Her paternal grandmother had adult onset diabetes  mellitus. Her paternal aunt  had adult onset diabetes mellitus and a first  cousin had juvenile onset diabetes. Her mother, paternal aunt and first  cousin all have thyroid issues. Her grandmother had transient ischemic  attacks. Her sister is manic depressive and a maternal aunt has depression.  Genetic history is unremarkable.   SOCIAL HISTORY:  The patient is married to the father of the baby. He is  involved and supportive. His name is Jozalyn Baglio. The patient is Caucasian.  She is  of the [employer]. She has been followed  by the certified nurse  midwife service at [facility]. She denies any alcohol, drug or  tobacco use during this pregnancy. She has an AD degree and is employed at a  Designer, jewellery at [employer]. Her husband has 1 year of college.  He is employed as a Naval architect.   PHYSICAL EXAMINATION:  VITAL SIGNS:  Stable. The patient is afebrile.  HEENT:  Within normal limits.  LUNGS:  Breath sounds are clear.  HEART:  Regular rate and rhythm without murmur.  BREASTS:  Soft and nontender.  ABDOMEN:  Fundal height  is approximately 39 cm. Estimated fetal weight is 8  to 8-1/2 pounds. Uterine contractions every 3 minutes. Moderately strong  quality.  PELVIC:  The patient is leaking clear fluid. The cervix is 6 to 7 cm, 90%  with a vertex at a 0 station. Membranes are noted to be ruptured. Fetal  heart rate is reassuring on brief auscultation. No decelerations are  audible.  EXTREMITIES:  Deep tendon reflexes are 2+ without clonus.  There is a trace  edema noted.   IMPRESSION:  1. Intrauterine pregnancy at 39-1/7 weeks.  2. Active labor.  3. Negative group B Strep.  4. History of rapid labor.   PLAN:  1. Admit to birthing suite per consult with Dr. Silverio Lay, her attending     physician.  2. Routine certified nurse midwife orders.  3. Will attempt to place a saline lock in case the patient desires IV pain    medication. The patient understands labor may progress too  rapidly for     that.     Renaldo Reel Emilee Hero, C.N.M.  Crist Fat Rivard, M.D.    Leeanne Mannan  D:  10/08/2002  T:  10/08/2002  Job:  [id number]

## 2010-09-30 ENCOUNTER — Encounter: Payer: Self-pay | Admitting: *Deleted

## 2010-09-30 ENCOUNTER — Other Ambulatory Visit (HOSPITAL_COMMUNITY)
Admission: RE | Admit: 2010-09-30 | Discharge: 2010-09-30 | Disposition: A | Discharge status: Home / Self Care | Payer: Commercial Managed Care - PPO | Source: Ambulatory Visit | Attending: Family Medicine | Admitting: Family Medicine

## 2010-09-30 ENCOUNTER — Encounter: Payer: Self-pay | Admitting: Family Medicine

## 2010-09-30 ENCOUNTER — Ambulatory Visit (INDEPENDENT_AMBULATORY_CARE_PROVIDER_SITE_OTHER): Payer: Commercial Managed Care - PPO | Admitting: Family Medicine

## 2010-09-30 VITALS — BP 98/78 | HR 100 | Temp 98.3°F | Resp 18 | Ht 64.0 in | Wt 138.0 lb

## 2010-09-30 DIAGNOSIS — Z0000 Encounter for general adult medical examination without abnormal findings: Secondary | ICD-10-CM

## 2010-09-30 DIAGNOSIS — Z01419 Encounter for gynecological examination (general) (routine) without abnormal findings: Secondary | ICD-10-CM | POA: Insufficient documentation

## 2010-09-30 DIAGNOSIS — Z3009 Encounter for other general counseling and advice on contraception: Secondary | ICD-10-CM

## 2010-09-30 LAB — LIPID PANEL
LDL Cholesterol: 92 mg/dL (ref 0–99)
Total CHOL/HDL Ratio: 3
Triglycerides: 55 mg/dL (ref 0.0–149.0)
VLDL: 11 mg/dL (ref 0.0–40.0)

## 2010-09-30 LAB — POCT URINALYSIS DIPSTICK
Blood, UA: NEGATIVE
Glucose, UA: NEGATIVE
Nitrite, UA: NEGATIVE
Protein, UA: NEGATIVE
Urobilinogen, UA: 0.2
pH, UA: 5

## 2010-09-30 LAB — HEPATIC FUNCTION PANEL
ALT: 32 U/L (ref 0–35)
Bilirubin, Direct: 0.1 mg/dL (ref 0.0–0.3)
Total Bilirubin: 0.5 mg/dL (ref 0.3–1.2)

## 2010-09-30 LAB — CBC WITH DIFFERENTIAL/PLATELET
Eosinophils Relative: 2.8 % (ref 0.0–5.0)
MCV: 94.3 fl (ref 78.0–100.0)
Monocytes Absolute: 0.3 10*3/uL (ref 0.1–1.0)
Monocytes Relative: 9.1 % (ref 3.0–12.0)
Neutrophils Relative %: 55.3 % (ref 43.0–77.0)
Platelets: 202 10*3/uL (ref 150.0–400.0)
WBC: 3.5 10*3/uL — ABNORMAL LOW (ref 4.5–10.5)

## 2010-09-30 LAB — BASIC METABOLIC PANEL
BUN: 11 mg/dL (ref 6–23)
Creatinine, Ser: 0.8 mg/dL (ref 0.4–1.2)
GFR: 82.42 mL/min (ref >60.00)

## 2010-09-30 MED ORDER — MEDROXYPROGESTERONE ACETATE 150 MG/ML IM SUSP
150.0000 mg | Freq: Once | INTRAMUSCULAR | Status: AC
  Administered 2010-09-30: 150 mg via INTRAMUSCULAR

## 2010-09-30 NOTE — Patient Instructions (Signed)

## 2010-09-30 NOTE — Progress Notes (Signed)
Subjective:     Brooke [name] is a 40 y.o. female and is here for a comprehensive physical exam. The patient reports no problems.  History   Social History  . Marital Status: Married    Spouse Name: N/A    Number of Children: N/A  . Years of Education: N/A   Occupational History  . Not on file.   Social History Main Topics  . Smoking status: Not on file  . Smokeless tobacco: Not on file  . Alcohol Use:   . Drug Use:   . Sexually Active:    Other Topics Concern  . Not on file   Social History Narrative  . No narrative on file   Health Maintenance  Topic Date Due  . Pap Smear  10/04/1986  . Tetanus/tdap  05/18/2016    The following portions of the patient's history were reviewed and updated as appropriate: allergies, current medications, past family history, past medical history, past social history, past surgical history and problem list.  Review of Systems Review of Systems  Constitutional: Negative for activity change, appetite change and fatigue.  HENT: Negative for hearing loss, congestion, tinnitus and ear discharge.  dentist q46m Eyes: Negative for visual disturbance (see optho q1y -- vision corrected to 20/20 with glasses).  Respiratory: Negative for cough, chest tightness and shortness of breath.   Cardiovascular: Negative for chest pain, palpitations and leg swelling.  Gastrointestinal: Negative for abdominal pain, diarrhea, constipation and abdominal distention.  Genitourinary: Negative for urgency, frequency, decreased urine volume and difficulty urinating.  Musculoskeletal: Negative for back pain, arthralgias and gait problem.  Skin: Negative for color change, pallor and rash.  Neurological: Negative for dizziness, light-headedness, numbness and headaches.  Hematological: Negative for adenopathy. Does not bruise/bleed easily.  Psychiatric/Behavioral: Negative for suicidal ideas, confusion, sleep disturbance, self-injury, dysphoric mood, decreased  concentration and agitation.       Objective:    BP 98/78  Pulse 100  Temp(Src) 98.3 F (36.8 C) (Oral)  Resp 18  Ht 5\' 4"  (1.626 m)  Wt 138 lb (62.596 kg)  BMI 23.69 kg/m2  SpO2 97%  General Appearance:    Alert, cooperative, no distress, appears stated age  Head:    Normocephalic, without obvious abnormality, atraumatic  Eyes:    PERRL, conjunctiva/corneas clear, EOM's intact, fundi    benign, both eyes  Ears:    Normal TM's and external ear canals, both ears  Nose:   Nares normal, septum midline, mucosa normal, no drainage    or sinus tenderness  Throat:   Lips, mucosa, and tongue normal; teeth and gums normal  Neck:   Supple, symmetrical, trachea midline, no adenopathy;    thyroid:  no enlargement/tenderness/nodules; no carotid   bruit or JVD  Back:     Symmetric, no curvature, ROM normal, no CVA tenderness  Lungs:     Clear to auscultation bilaterally, respirations unlabored  Chest Wall:    No tenderness or deformity   Heart:    Regular rate and rhythm, S1 and S2 normal, no murmur, rub   or gallop  Breast Exam:    No tenderness, masses, or nipple abnormality  Abdomen:     Soft, non-tender, bowel sounds active all four quadrants,    no masses, no organomegaly  Genitalia:    Normal female without lesion, discharge or tenderness  Rectal:    Normal tone, normal prostate, no masses or tenderness;   guaiac negative stool  Extremities:   Extremities normal, atraumatic, no cyanosis  or edema  Pulses:   2+ and symmetric all extremities  Skin:   Skin color, texture, turgor normal, no rashes or lesions  Lymph nodes:   Cervical, supraclavicular, and axillary nodes normal  Neurologic:   CNII-XII intact, normal strength, sensation and reflexes    throughout     Assessment:    Healthy female exam.     Plan:    ghm utd Check fasting labs mammo done about 6 months ago See After Visit Summary for Counseling Recommendations

## 2010-10-06 ENCOUNTER — Encounter: Payer: Self-pay | Admitting: *Deleted

## 2010-12-30 ENCOUNTER — Ambulatory Visit (INDEPENDENT_AMBULATORY_CARE_PROVIDER_SITE_OTHER): Payer: Commercial Managed Care - PPO | Admitting: *Deleted

## 2010-12-30 DIAGNOSIS — Z3009 Encounter for other general counseling and advice on contraception: Secondary | ICD-10-CM

## 2010-12-30 MED ORDER — MEDROXYPROGESTERONE ACETATE 150 MG/ML IM SUSP
150.0000 mg | Freq: Once | INTRAMUSCULAR | Status: AC
  Administered 2010-12-30: 150 mg via INTRAMUSCULAR

## 2011-03-18 ENCOUNTER — Ambulatory Visit (INDEPENDENT_AMBULATORY_CARE_PROVIDER_SITE_OTHER): Payer: Commercial Managed Care - PPO | Admitting: Family Medicine

## 2011-03-18 ENCOUNTER — Encounter: Payer: Self-pay | Admitting: Family Medicine

## 2011-03-18 VITALS — BP 122/74 | HR 113 | Temp 98.2°F | Wt 143.8 lb

## 2011-03-18 DIAGNOSIS — Z309 Encounter for contraceptive management, unspecified: Secondary | ICD-10-CM

## 2011-03-18 DIAGNOSIS — J329 Chronic sinusitis, unspecified: Secondary | ICD-10-CM

## 2011-03-18 MED ORDER — AMOXICILLIN-POT CLAVULANATE 875-125 MG PO TABS: 1.0000 | ORAL_TABLET | Freq: Two times a day (BID) | ORAL | Status: AC

## 2011-03-18 MED ORDER — MEDROXYPROGESTERONE ACETATE 150 MG/ML IM SUSP
150.0000 mg | Freq: Once | INTRAMUSCULAR | Status: AC
  Administered 2011-03-18: 150 mg via INTRAMUSCULAR

## 2011-03-18 MED ORDER — AZELASTINE-FLUTICASONE 137-50 MCG/ACT NA SUSP: 1.0000 | Freq: Two times a day (BID) | NASAL | Status: DC

## 2011-03-18 NOTE — Progress Notes (Signed)
  Subjective:     Brooke [name] is a 40 y.o. female who presents for evaluation of sinus pain. Symptoms include: congestion, cough, facial pain, headaches, nasal congestion, sinus pressure and sore throat. Onset of symptoms was 5 days ago. Symptoms have been gradually worsening since that time. Past history is significant for no history of pneumonia or bronchitis. Patient is a non-smoker.  The following portions of the patient's history were reviewed and updated as appropriate: allergies, current medications, past family history, past medical history, past social history, past surgical history and problem list.  Review of Systems Pertinent items are noted in HPI.   Objective:    BP 122/74  Pulse 113  Temp(Src) 98.2 F (36.8 C) (Oral)  Wt 143 lb 12.8 oz (65.227 kg)  SpO2 97% General appearance: alert, cooperative, appears stated age and no distress Ears: normal TM's and external ear canals both ears Nose: green discharge, moderate congestion, sinus tenderness bilateral Throat: abnormal findings: mild oropharyngeal erythema and pnd Neck: mild anterior cervical adenopathy, supple, symmetrical, trachea midline and thyroid not enlarged, symmetric, no tenderness/mass/nodules Lungs: clear to auscultation bilaterally Heart: regular rate and rhythm, S1, S2 normal, no murmur, click, rub or gallop Extremities: extremities normal, atraumatic, no cyanosis or edema Lymph nodes: Cervical, supraclavicular, and axillary nodes normal.    Assessment:    Acute bacterial sinusitis.    Plan:    Nasal saline sprays. Nasal steroids per medication orders. Antihistamines per medication orders. Augmentin per medication orders. Follow up in a few days or as needed.

## 2011-03-18 NOTE — Patient Instructions (Signed)

## 2011-05-05 ENCOUNTER — Encounter: Payer: Self-pay | Admitting: Family Medicine

## 2011-05-05 ENCOUNTER — Ambulatory Visit (INDEPENDENT_AMBULATORY_CARE_PROVIDER_SITE_OTHER): Payer: Commercial Managed Care - PPO | Admitting: Family Medicine

## 2011-05-05 VITALS — BP 105/75 | HR 98 | Temp 98.8°F | Ht 63.75 in | Wt 144.6 lb

## 2011-05-05 DIAGNOSIS — J019 Acute sinusitis, unspecified: Secondary | ICD-10-CM

## 2011-05-05 DIAGNOSIS — J3489 Other specified disorders of nose and nasal sinuses: Secondary | ICD-10-CM

## 2011-05-05 MED ORDER — CLARITHROMYCIN ER 500 MG PO TB24: 1000.0000 mg | ORAL_TABLET | Freq: Every day | ORAL | Status: AC

## 2011-05-05 NOTE — Patient Instructions (Signed)
This is a sinus infection Take the Biaxin as directed- 2 pills at the same time- w/ food Drink plenty of fluids Add Mucinex to thin your congestion Call with any questions or concerns Happy New Year!!!

## 2011-05-05 NOTE — Assessment & Plan Note (Signed)
Pt's sxs and PE consistent w/ infxn.  Start abx.  Reviewed supportive care and red flags that should prompt return.  Pt expressed understanding and is in agreement w/ plan.  

## 2011-05-05 NOTE — Progress Notes (Signed)
  Subjective:    Patient ID: Brooke [name], female    DOB: [birth year], 41 y.o.   MRN: [id number]  HPI Sinus infxn- sxs started Saturday w/ pressure behind eyes.  + nasal congestion, facial pain.  No tooth pain, fevers.  Some ear fullness.  Minimal cough.  + sick contacts.   Review of Systems For ROS see HPI     Objective:   Physical Exam  Vitals reviewed. Constitutional: She appears well-developed and well-nourished. No distress.  HENT:  Head: Normocephalic and atraumatic.  Right Ear: Tympanic membrane normal.  Left Ear: Tympanic membrane normal.  Nose: Mucosal edema and rhinorrhea present. Right sinus exhibits maxillary sinus tenderness and frontal sinus tenderness. Left sinus exhibits maxillary sinus tenderness and frontal sinus tenderness.  Mouth/Throat: Uvula is midline and mucous membranes are normal. Posterior oropharyngeal erythema present. No oropharyngeal exudate.  Eyes: Conjunctivae and EOM are normal. Pupils are equal, round, and reactive to light.  Neck: Normal range of motion. Neck supple.  Cardiovascular: Normal rate, regular rhythm and normal heart sounds.   Pulmonary/Chest: Effort normal and breath sounds normal. No respiratory distress. She has no wheezes.  Lymphadenopathy:    She has no cervical adenopathy.          Assessment & Plan:

## 2011-05-10 ENCOUNTER — Ambulatory Visit (INDEPENDENT_AMBULATORY_CARE_PROVIDER_SITE_OTHER): Payer: Commercial Managed Care - PPO | Admitting: Family Medicine

## 2011-05-10 ENCOUNTER — Encounter: Payer: Self-pay | Admitting: Family Medicine

## 2011-05-10 VITALS — BP 114/74 | HR 106 | Temp 98.8°F | Wt 142.8 lb

## 2011-05-10 DIAGNOSIS — J329 Chronic sinusitis, unspecified: Secondary | ICD-10-CM

## 2011-05-10 MED ORDER — METHYLPREDNISOLONE ACETATE PF 80 MG/ML IJ SUSP
80.0000 mg | Freq: Once | INTRAMUSCULAR | Status: AC
  Administered 2011-05-10: 80 mg via INTRAMUSCULAR

## 2011-05-10 MED ORDER — LEVOFLOXACIN 500 MG PO TABS: 500.0000 mg | ORAL_TABLET | Freq: Every day | ORAL | Status: AC

## 2011-05-10 MED ORDER — PREDNISONE 10 MG PO TABS: ORAL_TABLET | ORAL | Status: DC

## 2011-05-10 NOTE — Progress Notes (Signed)
  Subjective:     Brooke [name] is a 41 y.o. female who presents for evaluation of sinus pain. Symptoms include: congestion, facial pain, mouth breathing, nasal congestion and sinus pressure. Onset of symptoms was 7 week ago. Symptoms have been gradually worsening since that time. Past history is significant for no history of pneumonia or bronchitis. Patient is a non-smoker.   Pt was seen 1/2 and put on biaxin ---no better.  The following portions of the patient's history were reviewed and updated as appropriate: allergies, current medications, past family history, past medical history, past social history, past surgical history and problem list.  Review of Systems Pertinent items are noted in HPI.   Objective:    BP 114/74  Pulse 106  Temp(Src) 98.8 F (37.1 C) (Oral)  Wt 142 lb 12.8 oz (64.774 kg)  SpO2 99% General appearance: alert, cooperative, appears stated age and no distress Ears: normal TM's and external ear canals both ears Nose: green discharge, moderate congestion, sinus tenderness bilateral Throat: lips, mucosa, and tongue normal; teeth and gums normal Neck: mild anterior cervical adenopathy, supple, symmetrical, trachea midline and thyroid not enlarged, symmetric, no tenderness/mass/nodules Lungs: clear to auscultation bilaterally Heart: regular rate and rhythm, S1, S2 normal, no murmur, click, rub or gallop Extremities: extremities normal, atraumatic, no cyanosis or edema Lymph nodes: Cervical adenopathy: b/l    Assessment:    Acute bacterial sinusitis.    Plan:    Nasal saline sprays. Nasal steroids per medication orders. Antihistamines per medication orders. levaquin 500 per medication orders. pred taper and f/u prn

## 2011-05-10 NOTE — Progress Notes (Signed)
Addended by: Arnette Norris on: 05/10/2011 01:15 PM   Modules accepted: Orders

## 2011-05-10 NOTE — Patient Instructions (Signed)

## 2011-06-03 ENCOUNTER — Ambulatory Visit (INDEPENDENT_AMBULATORY_CARE_PROVIDER_SITE_OTHER): Payer: 59 | Admitting: *Deleted

## 2011-06-03 DIAGNOSIS — Z309 Encounter for contraceptive management, unspecified: Secondary | ICD-10-CM

## 2011-06-03 MED ORDER — MEDROXYPROGESTERONE ACETATE 150 MG/ML IM SUSP
150.0000 mg | Freq: Once | INTRAMUSCULAR | Status: AC
  Administered 2011-06-03: 150 mg via INTRAMUSCULAR

## 2011-08-20 ENCOUNTER — Ambulatory Visit: Payer: 59

## 2011-08-25 ENCOUNTER — Ambulatory Visit (INDEPENDENT_AMBULATORY_CARE_PROVIDER_SITE_OTHER): Payer: 59

## 2011-08-25 DIAGNOSIS — Z309 Encounter for contraceptive management, unspecified: Secondary | ICD-10-CM

## 2011-08-25 MED ORDER — MEDROXYPROGESTERONE ACETATE 150 MG/ML IM SUSP
150.0000 mg | Freq: Once | INTRAMUSCULAR | Status: AC
  Administered 2011-08-25: 150 mg via INTRAMUSCULAR

## 2011-11-12 ENCOUNTER — Ambulatory Visit (INDEPENDENT_AMBULATORY_CARE_PROVIDER_SITE_OTHER): Payer: 59 | Admitting: *Deleted

## 2011-11-12 DIAGNOSIS — Z309 Encounter for contraceptive management, unspecified: Secondary | ICD-10-CM

## 2011-11-12 MED ORDER — MEDROXYPROGESTERONE ACETATE 150 MG/ML IM SUSP
150.0000 mg | Freq: Once | INTRAMUSCULAR | Status: AC
  Administered 2011-11-12: 150 mg via INTRAMUSCULAR

## 2011-11-21 DIAGNOSIS — L03019 Cellulitis of unspecified finger: Secondary | ICD-10-CM | POA: Insufficient documentation

## 2011-11-21 DIAGNOSIS — F411 Generalized anxiety disorder: Secondary | ICD-10-CM | POA: Insufficient documentation

## 2011-11-21 DIAGNOSIS — F3289 Other specified depressive episodes: Secondary | ICD-10-CM | POA: Insufficient documentation

## 2011-11-21 DIAGNOSIS — F329 Major depressive disorder, single episode, unspecified: Secondary | ICD-10-CM | POA: Insufficient documentation

## 2011-11-22 ENCOUNTER — Emergency Department (HOSPITAL_COMMUNITY)
Admission: EM | Admit: 2011-11-22 | Discharge: 2011-11-22 | Disposition: A | Discharge status: Home / Self Care | Payer: 59 | Attending: Emergency Medicine | Admitting: Emergency Medicine

## 2011-12-13 ENCOUNTER — Ambulatory Visit (INDEPENDENT_AMBULATORY_CARE_PROVIDER_SITE_OTHER): Payer: 59 | Admitting: Family Medicine

## 2011-12-13 ENCOUNTER — Encounter: Payer: Self-pay | Admitting: Family Medicine

## 2011-12-13 VITALS — BP 110/70 | HR 103 | Temp 99.1°F | Wt 148.6 lb

## 2011-12-13 DIAGNOSIS — R350 Frequency of micturition: Secondary | ICD-10-CM

## 2011-12-13 DIAGNOSIS — G4700 Insomnia, unspecified: Secondary | ICD-10-CM

## 2011-12-13 DIAGNOSIS — M549 Dorsalgia, unspecified: Secondary | ICD-10-CM

## 2011-12-13 DIAGNOSIS — G479 Sleep disorder, unspecified: Secondary | ICD-10-CM | POA: Insufficient documentation

## 2011-12-13 HISTORY — DX: Dorsalgia, unspecified: M54.9

## 2011-12-13 LAB — POCT URINALYSIS DIPSTICK
Blood, UA: NEGATIVE
Ketones, UA: NEGATIVE
Protein, UA: NEGATIVE
Spec Grav, UA: <=1.005
pH, UA: 5

## 2011-12-13 MED ORDER — ESZOPICLONE 2 MG PO TABS: 2.0000 mg | ORAL_TABLET | Freq: Every day | ORAL | Status: DC

## 2011-12-13 NOTE — Patient Instructions (Addendum)

## 2011-12-13 NOTE — Progress Notes (Signed)
  Subjective:    Patient ID: Brooke [name], female    DOB: 12-Apr-1971, 41 y.o.   MRN: [id number]  HPI Pt here with her husband c/o insomnia that has gotten worse.  Pt only gets a couple hours of sleep even with ambien and she almost fell asleep while driving.  She had a sleep study done years ago and was told she just had RLS.  Her husband states she is not a restless sleeper when she does fall asleep.  She snores only a little bit.   She has recently c/o back pain as well but thinks its because she is not sleeping.     Review of Systems As aobve    Objective:   Physical Exam  Constitutional: She is oriented to person, place, and time. She appears well-developed and well-nourished.  Cardiovascular: Normal rate.   Pulmonary/Chest: Effort normal and breath sounds normal. No respiratory distress. She has no wheezes. She has no rales.  Musculoskeletal: Normal range of motion. She exhibits no edema and no tenderness.  Neurological: She is alert and oriented to person, place, and time.  Psychiatric: She has a normal mood and affect. Her behavior is normal. Judgment and thought content normal.          Assessment & Plan:

## 2011-12-13 NOTE — Assessment & Plan Note (Signed)
Even with [employer]- only 2-3 x a week Refer to pulm for sleep eval

## 2011-12-13 NOTE — Assessment & Plan Note (Signed)
ua neg May be from not sleeping well

## 2012-01-06 ENCOUNTER — Encounter: Payer: Self-pay | Admitting: Pulmonary Disease

## 2012-01-06 ENCOUNTER — Ambulatory Visit (INDEPENDENT_AMBULATORY_CARE_PROVIDER_SITE_OTHER): Payer: 59 | Admitting: Pulmonary Disease

## 2012-01-06 VITALS — BP 98/62 | HR 87 | Temp 98.3°F | Ht 63.5 in | Wt 152.0 lb

## 2012-01-06 DIAGNOSIS — G4700 Insomnia, unspecified: Secondary | ICD-10-CM

## 2012-01-06 NOTE — Progress Notes (Signed)
Chief Complaint  Patient presents with  . Sleep Consult    Referred by Dr Laury Axon. Epworth score: 19    History of Present Illness: Brooke [name] is a 41 y.o. female for evaluation of sleep problems.  She has noticed trouble with her sleep for years.  She has a history of anxiety and depression.  She is followed by Dr. Jennelle Human for this.    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  She was tried on therapy for this, but is not sure if this helped.  She is not taking medicine for this now.  She denies symptoms of restless legs, and her husband has not told her she kicks her legs while asleep.  She was started on [place] several years ago.  This seemed to help with her sleep initially, but has not been working recently.  She has trouble falling asleep, and staying asleep.  She will take ambien at about 830 pm.  She will go to bed at 9 pm.  She will fiddle with her phone before going to sleep.  She can take 30 minutes to fall asleep.  She will wake up after about 3 to 4 hours, and then frequently not be able to fall back to sleep.  She will then get up and start working around the house.  Her husband goes to bed at 1 am.  He will leave the TV on all night.  She starts her day at 6 am.  She feels better is she can take a nap.  She drinks 6 bottles of Mountain Dew per day.  She can fall asleep easily when sitting quiet, and has noticed more trouble staying awake while driving.  She used to talk in her sleep as a child, but not recently.  She does snore.  She works in the OR as a Engineer, civil (consulting) at [employer].  She recently changed to weekend nights, and works Friday through Sunday nights.    The patient denies sleep walking, bruxism, or nightmares.  The patient denies sleep hallucinations, sleep paralysis, or cataplexy.   Past Medical History  Diagnosis Date  . Depression   . Incontinence     Past Surgical History  Procedure Date  . Rhinoplasty   .  Tonsillectomy   . Sinus endo w/fusion     Current Outpatient Prescriptions on File Prior to Visit  Medication Sig Dispense Refill  . ALPRAZolam (XANAX) 0.5 MG tablet Take by mouth at bedtime as needed. Take 2 tab at bedtime       . ARIPiprazole (ABILIFY) 5 MG tablet Take 2.5 mg by mouth daily.        Marland Kitchen estradiol cypionate (DEPO-ESTRADIOL) 5 MG/ML injection Inject into the muscle every 3 (three) months.      Marland Kitchen PARoxetine (PAXIL) 20 MG tablet Take 80 mg by mouth every morning.        . topiramate (TOPAMAX) 100 MG tablet Take 100 mg by mouth daily.          No Known Allergies  Family History  Problem Relation Age of Onset  . Thyroid disease Mother   . Diabetes Father   . Hypertension Father   . Hypertension Paternal Uncle   . Diabetes Paternal Grandmother     History  Substance Use Topics  . Smoking status: Never Smoker   . Smokeless tobacco: Not on file  . Alcohol Use: No     Physical Exam: Filed Vitals:   01/06/12  1537  BP: 98/62  Pulse: 87  Temp: 98.3 F (36.8 C)  TempSrc: Oral  Height: 5' 3.5" (1.613 m)  Weight: 152 lb (68.947 kg)  SpO2: 98%  ,  Current Encounter SPO2  01/06/12 1537 98%  12/13/11 1629 96%  05/10/11 1115 99%    Wt Readings from Last 3 Encounters:  01/06/12 152 lb (68.947 kg)  12/13/11 148 lb 9.6 oz (67.405 kg)  05/10/11 142 lb 12.8 oz (64.774 kg)    Body mass index is 26.50 kg/(m^2).   General - No distress ENT - No sinus tenderness, narrow nasal angles, MP 3, no oral exudate, no LAN, no thyromegaly Cardiac - s1s2 regular, no murmur, pulses symmetric, no edema Chest - normal respiratory excursion, good air entry, no wheeze/rales/dullness Back - no focal tenderness Abd - soft, non-tender, no organomegaly, + bowel sounds Ext - normal motor strength Neuro - Cranial nerves are normal. PERLA. EOM's intact. Skin - no discernible active dermatitis, erythema, urticaria or inflammatory process. Psych - normal mood, and behavior.   Lab  Results  Component Value Date   WBC 3.5* 09/30/2010   HGB 14.0 09/30/2010   HCT 40.6 09/30/2010   MCV 94.3 09/30/2010   PLT 202.0 09/30/2010    Lab Results  Component Value Date   CREATININE 0.8 09/30/2010   BUN 11 09/30/2010   NA 140 09/30/2010   K 3.8 09/30/2010   CL 110 09/30/2010   CO2 23 09/30/2010    Lab Results  Component Value Date   ALT 32 09/30/2010   AST 27 09/30/2010   ALKPHOS 65 09/30/2010   BILITOT 0.5 09/30/2010    Lab Results  Component Value Date   TSH 1.15 09/30/2010   Assessment/Plan:  Coralyn Helling, MD [place] [facility] Pager:  [phone] 01/06/2012, 3:59 PM

## 2012-01-06 NOTE — Assessment & Plan Note (Signed)
She has trouble with sleep initiation and sleep maintenance.  She has poor sleep hygiene, and shift work schedule.  She has history of anxiety and depression.  She also reports weight gain, and snoring.  Advised that she needs to maintain regular sleep wake schedule.  She needs to limit caffeine intake.  Advised her to d/w her husband about avoiding TV while in bed.    While she did have a negative sleep study from 2006, it is possible with aging and increased weight that she could have sleep apnea now contributing to her sleep difficulties.  To further assess will arrange for in lab sleep study.  She is to try using paxil in the morning.  Explained that SSRI's can sometimes contribute to insomnia.  She is to continue taking her other psychiatric medications at night.  Advised that it is okay to continue ambien for now, and will address further after review of her sleep study.

## 2012-01-06 NOTE — Patient Instructions (Signed)
Take paxil in morning Try avoiding caffeine drinks in the afternoon Will schedule sleep study Will call to schedule follow up after sleep study reviewed

## 2012-01-20 ENCOUNTER — Ambulatory Visit (INDEPENDENT_AMBULATORY_CARE_PROVIDER_SITE_OTHER): Payer: 59

## 2012-01-20 DIAGNOSIS — Z23 Encounter for immunization: Secondary | ICD-10-CM

## 2012-01-27 ENCOUNTER — Ambulatory Visit (HOSPITAL_BASED_OUTPATIENT_CLINIC_OR_DEPARTMENT_OTHER): Payer: 59 | Attending: Pulmonary Disease

## 2012-01-27 VITALS — Ht 63.0 in | Wt 148.0 lb

## 2012-01-27 DIAGNOSIS — G4730 Sleep apnea, unspecified: Secondary | ICD-10-CM | POA: Insufficient documentation

## 2012-01-27 DIAGNOSIS — G4700 Insomnia, unspecified: Secondary | ICD-10-CM

## 2012-01-27 DIAGNOSIS — G4710 Hypersomnia, unspecified: Secondary | ICD-10-CM | POA: Insufficient documentation

## 2012-01-27 DIAGNOSIS — G4733 Obstructive sleep apnea (adult) (pediatric): Secondary | ICD-10-CM

## 2012-01-28 ENCOUNTER — Ambulatory Visit (INDEPENDENT_AMBULATORY_CARE_PROVIDER_SITE_OTHER): Payer: 59

## 2012-01-28 DIAGNOSIS — Z111 Encounter for screening for respiratory tuberculosis: Secondary | ICD-10-CM

## 2012-01-28 DIAGNOSIS — Z0000 Encounter for general adult medical examination without abnormal findings: Secondary | ICD-10-CM

## 2012-01-28 DIAGNOSIS — Z23 Encounter for immunization: Secondary | ICD-10-CM

## 2012-01-28 MED ORDER — MEDROXYPROGESTERONE ACETATE 150 MG/ML IM SUSP: 150.0000 mg | Freq: Once | INTRAMUSCULAR | Status: DC

## 2012-01-31 ENCOUNTER — Telehealth: Payer: Self-pay | Admitting: Pulmonary Disease

## 2012-01-31 DIAGNOSIS — G4710 Hypersomnia, unspecified: Secondary | ICD-10-CM

## 2012-01-31 LAB — TB SKIN TEST: TB Skin Test: NEGATIVE

## 2012-01-31 NOTE — Telephone Encounter (Signed)
PSG 01/27/12>>AHI 0.5, SpO2 low 94%, PLMI 2, Sleep latency 2.5 min.  Will have my nurse schedule ROV to review results.

## 2012-01-31 NOTE — Addendum Note (Signed)
Addended by: Arnette Norris on: 01/31/2012 08:15 AM   Modules accepted: Orders

## 2012-01-31 NOTE — Procedures (Signed)
NAME:  Brooke [name], Brooke [name] NO.:  [id number]  MEDICAL RECORD NO.:  [id number]          PATIENT TYPE:  OUT  LOCATION:  SLEEP CENTER                 FACILITY:  [facility]  PHYSICIAN:  Coralyn Helling, MD        DATE OF BIRTH:  August 18, 1970  DATE OF STUDY:  01/27/2012                           NOCTURNAL POLYSOMNOGRAM  REFERRING PHYSICIAN:  Coralyn Helling, MD  FACILITY:  [facility].  REFERRING PHYSICIAN:  Coralyn Helling, MD  INDICATION FOR STUDY:  [name] is a 41 year old female, who has snoring sleep disruption, daytime sleepiness.  She also has history of depression and anxiety.  She also has shift work.  She is referred to the sleep lab for evaluation of hypersomnia with obstructive sleep apnea.  Height is 5 feet 3 inches, weight is 148 pounds.  BMI is 26, neck size is 26 inches.  EPWORTH SLEEPINESS SCORE:  18.  MEDICATIONS:  Paxil, Ambien, Xanax, Abilify, Topamax, and Depo-Provera.  SLEEP ARCHITECTURE:  Total recording time was 375 minutes.  Total sleep time was 360 minutes.  Sleep efficiency was 96%.  Sleep latency was 2.5 minutes.  REM latency was 144 minutes.  The study was notable for the lack of slow-wave sleep and the patient slept in both supine and nonsupine positions.  RESPIRATORY DATA:  The average respiratory rate was 16.  Moderate snoring was noted by the technician.  The overall apnea/hypopnea index was 0.5.  The events were exclusively obstructive in nature.  OXYGEN DATA:  The baseline oxygenation was 94%.  The oxygen saturation nadir was 94%.  The study was conducted without the use of supplemental oxygen.  CARDIAC DATA:  The average heart rate was 68.  The rhythm strip showed normal sinus rhythm.  MOVEMENT-PARASOMNIA:  The periodic limb movement index was 2 and the patient had no restroom trips.  IMPRESSIONS-RECOMMENDATIONS:  This study did not show evidence for sleep- disordered breathing or periodic limb movements.  There were no  other abnormal behaviors noted during sleep.  She did have a significant decrease in her sleep latency.  If the patient has persistent symptoms of daytime sleepiness, then consideration could be given to having her undergo either a multiple sleep latency test or a maintenance of wakefulness test.  Clinical correlation would be necessary to determine if these studies would be beneficial.     Coralyn Helling, MD Diplomat, American Board of Sleep Medicine    VS/MEDQ  D:  01/31/2012 09:48:24  T:  01/31/2012 21:42:43  Job:  [id number]

## 2012-02-01 NOTE — Telephone Encounter (Signed)
PT returned our call.  Call her back @ [phone] Brooke [name]

## 2012-02-01 NOTE — Telephone Encounter (Signed)
ATC pt NA unable to leave VM bc it has not been set up yet wcb 

## 2012-02-02 NOTE — Telephone Encounter (Signed)
Pt returned call. I have scheduled her for a f/u 03-01-12 but she wants to know the results in the meantime. Brooke [name]

## 2012-02-02 NOTE — Telephone Encounter (Signed)
Pt just needs to schedule OV w/ VS to discuss sleep study. ATC NA WCB

## 2012-02-02 NOTE — Telephone Encounter (Signed)
Pt is wanting results over the phone. Please advise Dr. Craige Cotta thanks

## 2012-02-04 ENCOUNTER — Telehealth: Payer: Self-pay | Admitting: Pulmonary Disease

## 2012-02-04 NOTE — Telephone Encounter (Signed)
Please inform patient that sleep study did not show evidence for sleep apnea.  She would need to have ROV to discuss if other tests are needed.

## 2012-02-04 NOTE — Telephone Encounter (Signed)
Call pt at [phone] after 3:00

## 2012-02-04 NOTE — Telephone Encounter (Signed)
Duplicate message. 

## 2012-02-04 NOTE — Telephone Encounter (Signed)
I spoke with patient about results and she verbalized understanding and had no questions 

## 2012-02-04 NOTE — Telephone Encounter (Signed)
ATC pt but VM has not been set up yet. WCB

## 2012-03-01 ENCOUNTER — Ambulatory Visit (INDEPENDENT_AMBULATORY_CARE_PROVIDER_SITE_OTHER): Payer: 59 | Admitting: Internal Medicine

## 2012-03-01 ENCOUNTER — Encounter: Payer: Self-pay | Admitting: Internal Medicine

## 2012-03-01 ENCOUNTER — Encounter: Payer: Self-pay | Admitting: Pulmonary Disease

## 2012-03-01 ENCOUNTER — Ambulatory Visit (INDEPENDENT_AMBULATORY_CARE_PROVIDER_SITE_OTHER): Payer: 59 | Admitting: Pulmonary Disease

## 2012-03-01 VITALS — BP 102/68 | HR 108 | Temp 98.6°F | Wt 151.0 lb

## 2012-03-01 VITALS — BP 108/70 | HR 84 | Temp 98.4°F | Ht 63.5 in | Wt 152.2 lb

## 2012-03-01 DIAGNOSIS — J029 Acute pharyngitis, unspecified: Secondary | ICD-10-CM

## 2012-03-01 DIAGNOSIS — G479 Sleep disorder, unspecified: Secondary | ICD-10-CM

## 2012-03-01 DIAGNOSIS — J069 Acute upper respiratory infection, unspecified: Secondary | ICD-10-CM

## 2012-03-01 MED ORDER — ARMODAFINIL 250 MG PO TABS: 250.0000 mg | ORAL_TABLET | Freq: Every day | ORAL | Status: DC | PRN

## 2012-03-01 NOTE — Patient Instructions (Signed)
Nuvigil 250 mg daily as needed Will schedule sleep study Follow up in 6 weeks

## 2012-03-01 NOTE — Assessment & Plan Note (Signed)
Her sleep study did not show evidence for sleep apnea or periodic limb movements.  She did have short sleep latency.    Her sleep quality is better since being started on trazodone.  She still has daytime sleepiness.  Will arrange for multiple sleep latency test to further assess.  Will also start her on nuvigil.  Explained that some of her medications may be contributing to her sleepiness, and that she would need to d/w psychiatry.

## 2012-03-01 NOTE — Progress Notes (Signed)
  Subjective:    Patient ID: Brooke [name], female    DOB: December 29, 1970, 41 y.o.   MRN: [id number]  HPI Acute visit Symptoms started 2 days ago: Sore throat, sinus pressure and, mild frontal headache that decreased with ibuprofen.  Past Medical History  Diagnosis Date  . Depression   . Incontinence    Past Surgical History  Procedure Date  . Rhinoplasty   . Tonsillectomy   . Sinus endo w/fusion    History   Social History  . Marital Status: Married    Spouse Name: N/A    Number of Children: N/A  . Years of Education: 16   Occupational History  . RN/OR [employer]   Social History Main Topics  . Smoking status: Never Smoker   . Smokeless tobacco: Never Used  . Alcohol Use: No  . Drug Use: No  . Sexually Active: Yes -- Female partner(s)   Other Topics Concern  . Not on file   Social History Narrative  . No narrative on file    Review of Systems No fever or chills Some cough no sputum production PN drip w/ yellow discharge No nausea, vomiting, diarrhea. No myalgias    Objective:   Physical Exam  General -- alert, well-developed. No apparent distress.  Neck --no LADs HEENT -- TMs normal, throat w/o redness, face symmetric and not tender to palpation, nose slt  congested   Lungs -- normal respiratory effort, no intercostal retractions, no accessory muscle use, and normal breath sounds.   Heart-- normal rate, regular rhythm, no murmur, and no gallop.   Extremities-- no pretibial edema bilaterally  Psych-- Cognition and judgment appear intact. Alert and cooperative with normal attention span and concentration.  not anxious appearing and not depressed appearing.      Assessment & Plan:  URI, Symptoms consistent with a URI, at this point I see no indication for antibiotics, will try conservative treatment, if not better consider a round of antibiotics. Patient in agreement. See instructions.

## 2012-03-01 NOTE — Patient Instructions (Addendum)
Rest, fluids , tylenol For cough, take Mucinex DM twice a day as needed  For congestion use Dymista 2 sprays on each side at bedtime until you feel better Call if no better in few days Call anytime if the symptoms are sever

## 2012-03-01 NOTE — Progress Notes (Signed)
  Chief Complaint  Patient presents with  . Follow-up    discuss sleep study results.    CC: Brooke [name]  History of Present Illness: Brooke [name] is a 42 y.o. female with sleep difficulties.  She is here to review her sleep study.  She was started on trazodone, and this has helped her sleep at night.  She is still feeling sleepy during the day.  She has noticed a runny nose, and throat irritation for the past two days.  She is not having fever, dysphagia, odynophagia, cough, sputum, or chest pain.  Tests: PSG 01/27/12>>AHI 0.5, SpO2 low 94%, PLMI 2, Sleep latency 2.5 min.  Past Medical History  Diagnosis Date  . Depression   . Incontinence     Past Surgical History  Procedure Date  . Rhinoplasty   . Tonsillectomy   . Sinus endo w/fusion     Current Outpatient Prescriptions on File Prior to Visit  Medication Sig Dispense Refill  . ALPRAZolam (XANAX) 0.5 MG tablet Take by mouth at bedtime as needed. Take 2 tab at bedtime       . ARIPiprazole (ABILIFY) 5 MG tablet Take 2.5 mg by mouth daily.        Marland Kitchen estradiol cypionate (DEPO-ESTRADIOL) 5 MG/ML injection Inject into the muscle every 3 (three) months.      Marland Kitchen PARoxetine (PAXIL) 20 MG tablet Take 80 mg by mouth every morning.        . topiramate (TOPAMAX) 100 MG tablet Take 100 mg by mouth daily.        . traZODone (DESYREL) 25 mg TABS Take 25-50 mg by mouth at bedtime.      . Armodafinil (NUVIGIL) 250 MG tablet Take 1 tablet (250 mg total) by mouth daily as needed.  30 tablet  5    No Known Allergies  Family History  Problem Relation Age of Onset  . Thyroid disease Mother   . Diabetes Father   . Hypertension Father   . Hypertension Paternal Uncle   . Diabetes Paternal Grandmother     History  Substance Use Topics  . Smoking status: Never Smoker   . Smokeless tobacco: Not on file  . Alcohol Use: No     Physical Exam: Filed Vitals:   03/01/12 1041 03/01/12 1043  BP:  108/70  Pulse:  84  Temp: 98.4 F  (36.9 C)   TempSrc: Oral   Height: 5' 3.5" (1.613 m)   Weight: 152 lb 3.2 oz (69.037 kg)   SpO2:  99%  ,  Current Encounter SPO2  03/01/12 1043 99%  01/06/12 1537 98%  12/13/11 1629 96%    Wt Readings from Last 3 Encounters:  03/01/12 152 lb 3.2 oz (69.037 kg)  01/27/12 148 lb (67.132 kg)  01/06/12 152 lb (68.947 kg)    Body mass index is 26.54 kg/(m^2).   General - No distress ENT - TM clear, no sinus tenderness, no oral exudate, no LAN Cardiac - s1s2 regular, no murmur Chest - No wheeze/rales/dullness, good air entry, normal respiratory excursion Back - No focal tenderness Abd - Soft, non-tender Ext - No edema Neuro - Normal strength Skin - No rashes Psych - Normal mood, and behavior.  Assessment/Plan:  Coralyn Helling, MD [facility] Pager:  [phone] 03/01/2012, 11:17 AM

## 2012-03-01 NOTE — Assessment & Plan Note (Signed)
No evidence to suggest bacterial infection.  Advised symptomatic therapy and clinical monitoring.  She is to call back if her symptoms get worse.

## 2012-03-02 ENCOUNTER — Ambulatory Visit: Payer: 59 | Admitting: Pulmonary Disease

## 2012-03-08 ENCOUNTER — Encounter (HOSPITAL_BASED_OUTPATIENT_CLINIC_OR_DEPARTMENT_OTHER): Payer: 59

## 2012-04-20 ENCOUNTER — Ambulatory Visit: Payer: 59 | Admitting: Pulmonary Disease

## 2012-05-01 ENCOUNTER — Ambulatory Visit (INDEPENDENT_AMBULATORY_CARE_PROVIDER_SITE_OTHER): Payer: 59

## 2012-05-01 DIAGNOSIS — Z309 Encounter for contraceptive management, unspecified: Secondary | ICD-10-CM

## 2012-05-01 DIAGNOSIS — IMO0001 Reserved for inherently not codable concepts without codable children: Secondary | ICD-10-CM

## 2012-05-01 MED ORDER — MEDROXYPROGESTERONE ACETATE 150 MG/ML IM SUSP: 150.0000 mg | Freq: Once | INTRAMUSCULAR | Status: DC

## 2012-07-28 ENCOUNTER — Telehealth: Payer: Self-pay | Admitting: Family Medicine

## 2012-07-28 NOTE — Telephone Encounter (Signed)
Pt is due march 17-march 31

## 2012-07-28 NOTE — Telephone Encounter (Signed)
Patient wants to come in for her DEPO shot. When will it be time for this? Thanks! °

## 2012-07-28 NOTE — Telephone Encounter (Signed)
Patient scheduled for 08/01/12 °

## 2012-08-01 ENCOUNTER — Ambulatory Visit (INDEPENDENT_AMBULATORY_CARE_PROVIDER_SITE_OTHER): Payer: 59 | Admitting: *Deleted

## 2012-08-01 DIAGNOSIS — IMO0001 Reserved for inherently not codable concepts without codable children: Secondary | ICD-10-CM

## 2012-08-01 DIAGNOSIS — Z309 Encounter for contraceptive management, unspecified: Secondary | ICD-10-CM

## 2012-08-01 MED ORDER — MEDROXYPROGESTERONE ACETATE 150 MG/ML IM SUSP
150.0000 mg | Freq: Once | INTRAMUSCULAR | Status: AC
  Administered 2012-08-01: 150 mg via INTRAMUSCULAR

## 2012-09-04 ENCOUNTER — Encounter: Payer: Self-pay | Admitting: Family Medicine

## 2012-09-04 ENCOUNTER — Ambulatory Visit (INDEPENDENT_AMBULATORY_CARE_PROVIDER_SITE_OTHER): Payer: 59 | Admitting: Family Medicine

## 2012-09-04 VITALS — BP 100/78 | HR 87 | Temp 98.6°F | Ht 63.25 in | Wt 160.2 lb

## 2012-09-04 DIAGNOSIS — J019 Acute sinusitis, unspecified: Secondary | ICD-10-CM

## 2012-09-04 DIAGNOSIS — R350 Frequency of micturition: Secondary | ICD-10-CM | POA: Insufficient documentation

## 2012-09-04 LAB — POCT URINALYSIS DIPSTICK
Blood, UA: NEGATIVE
Nitrite, UA: NEGATIVE
Protein, UA: NEGATIVE
Spec Grav, UA: 1.01
Urobilinogen, UA: 0.2
pH, UA: 6.5

## 2012-09-04 MED ORDER — SULFAMETHOXAZOLE-TRIMETHOPRIM 800-160 MG PO TABS: 1.0000 | ORAL_TABLET | Freq: Two times a day (BID) | ORAL | Status: DC

## 2012-09-04 NOTE — Progress Notes (Signed)
  Subjective:    Patient ID: Brooke [name], female    DOB: [birth year], 42 y.o.   MRN: [id number]  HPI Urinary frequency- pt reports intermittent sxs for 'months to as long as a year'.  No dysuria.  + urgency.  + suprapubic pressure.  Sinus pressure- sxs started 2 weeks ago.  Hx of sinus infxns and surgery.  + facial pain, HA.  + nasal congestion, PND.  Minimal cough.  No fevers.  Not currently on allergy meds.   Review of Systems For ROS see HPI     Objective:   Physical Exam  Vitals reviewed. Constitutional: She appears well-developed and well-nourished. No distress.  HENT:  Head: Normocephalic and atraumatic.  Right Ear: Tympanic membrane normal.  Left Ear: Tympanic membrane normal.  Nose: Mucosal edema and rhinorrhea present. Right sinus exhibits maxillary sinus tenderness and frontal sinus tenderness. Left sinus exhibits maxillary sinus tenderness and frontal sinus tenderness.  Mouth/Throat: Uvula is midline and mucous membranes are normal. Posterior oropharyngeal erythema present. No oropharyngeal exudate.  Eyes: Conjunctivae and EOM are normal. Pupils are equal, round, and reactive to light.  Neck: Normal range of motion. Neck supple.  Cardiovascular: Normal rate, regular rhythm and normal heart sounds.   Pulmonary/Chest: Effort normal and breath sounds normal. No respiratory distress. She has no wheezes.  Lymphadenopathy:    She has no cervical adenopathy.          Assessment & Plan:

## 2012-09-04 NOTE — Patient Instructions (Addendum)
This is a sinus infection and your urine also looks suspicious Start the Bactrim twice daily- take w/ food and avoid lying out in the sun (you will burn more easily) Drink plenty of fluids Start the Zyrtec daily Call with any questions or concerns Hang in there!

## 2012-09-04 NOTE — Assessment & Plan Note (Signed)
New to provider.  Pt w/ hx of similar.  Start abx.  Encouraged her to add daily antihistamine for control of seasonal allergies.  Pt expressed understanding and is in agreement w/ plan.

## 2012-09-04 NOTE — Assessment & Plan Note (Signed)
New.  UA suspicious for infection.  Start abx that will treat both UTI and sinus infxn.  Reviewed supportive care and red flags that should prompt return.  Pt expressed understanding and is in agreement w/ plan.

## 2012-11-01 ENCOUNTER — Ambulatory Visit (INDEPENDENT_AMBULATORY_CARE_PROVIDER_SITE_OTHER): Payer: 59

## 2012-11-01 DIAGNOSIS — Z309 Encounter for contraceptive management, unspecified: Secondary | ICD-10-CM

## 2012-11-01 DIAGNOSIS — IMO0001 Reserved for inherently not codable concepts without codable children: Secondary | ICD-10-CM

## 2012-11-01 MED ORDER — MEDROXYPROGESTERONE ACETATE 150 MG/ML IM SUSP
150.0000 mg | Freq: Once | INTRAMUSCULAR | Status: AC
  Administered 2012-11-01: 150 mg via INTRAMUSCULAR

## 2012-12-31 ENCOUNTER — Emergency Department (HOSPITAL_COMMUNITY)
Admission: EM | Admit: 2012-12-31 | Discharge: 2012-12-31 | Disposition: A | Discharge status: Home / Self Care | Payer: 59 | Source: Home / Self Care | Attending: Emergency Medicine | Admitting: Emergency Medicine

## 2012-12-31 ENCOUNTER — Encounter (HOSPITAL_COMMUNITY): Payer: Self-pay | Admitting: *Deleted

## 2012-12-31 DIAGNOSIS — J209 Acute bronchitis, unspecified: Secondary | ICD-10-CM

## 2012-12-31 MED ORDER — AZITHROMYCIN 250 MG PO TABS: 250.0000 mg | ORAL_TABLET | Freq: Every day | ORAL | Status: DC

## 2012-12-31 MED ORDER — GUAIFENESIN-CODEINE 100-10 MG/5ML PO SOLN: 10.0000 mL | Freq: Four times a day (QID) | ORAL | Status: DC | PRN

## 2012-12-31 NOTE — ED Provider Notes (Signed)
CSN: [id number]     Arrival date & time 12/31/12  1438 History   First MD Initiated Contact with Patient 12/31/12 1502     Chief Complaint  Patient presents with  . Facial Pain  . Cough   (Consider location/radiation/quality/duration/timing/severity/associated sxs/prior Treatment) Patient is a 42 y.o. female presenting with cough. The history is provided by the patient. No language interpreter was used.  Cough Cough characteristics:  Productive Sputum characteristics:  Yellow Severity:  Mild Onset quality:  Gradual Duration:  3 days Timing:  Intermittent Progression:  Unchanged Chronicity:  New Smoker: no   Context: occupational exposure and sick contacts   Context: not fumes   Relieved by:  Nothing Worsened by:  Nothing tried Ineffective treatments:  None tried Associated symptoms: fever   Associated symptoms: no chest pain, no chills, no myalgias, no shortness of breath and no sinus congestion     Past Medical History  Diagnosis Date  . Depression   . Incontinence    Past Surgical History  Procedure Laterality Date  . Rhinoplasty    . Tonsillectomy    . Sinus endo w/fusion     Family History  Problem Relation Age of Onset  . Thyroid disease Mother   . Diabetes Father   . Hypertension Father   . Hypertension Paternal Uncle   . Diabetes Paternal Grandmother    History  Substance Use Topics  . Smoking status: Never Smoker   . Smokeless tobacco: Never Used  . Alcohol Use: No   OB History   Grav Para Term Preterm Abortions TAB SAB Ect Mult Living                 Review of Systems  Constitutional: Positive for fever. Negative for chills.  HENT: Negative.   Eyes: Negative.   Respiratory: Positive for cough. Negative for shortness of breath.   Cardiovascular: Negative.  Negative for chest pain.  Gastrointestinal: Negative.   Endocrine: Negative.   Genitourinary: Negative.   Musculoskeletal: Negative.  Negative for myalgias.  Neurological: Negative.    Hematological: Negative.   Psychiatric/Behavioral: Negative.   All other systems reviewed and are negative.    Allergies  Review of patient's allergies indicates no known allergies.  Home Medications   Current Outpatient Rx  Name  Route  Sig  Dispense  Refill  . ALPRAZolam (XANAX) 0.5 MG tablet   Oral   Take by mouth at bedtime as needed. Take 2 tab at bedtime          . ARIPiprazole (ABILIFY) 5 MG tablet   Oral   Take 2.5 mg by mouth daily.           . medroxyPROGESTERone (DEPO-PROVERA) 150 MG/ML injection   Intramuscular   Inject 150 mg into the muscle every 3 (three) months.         Marland Kitchen PARoxetine (PAXIL) 20 MG tablet   Oral   Take 80 mg by mouth every morning.           . topiramate (TOPAMAX) 100 MG tablet   Oral   Take 100 mg by mouth daily.           Marland Kitchen zolpidem (AMBIEN) 5 MG tablet   Oral   Take 5 mg by mouth at bedtime as needed for sleep.         . Armodafinil (NUVIGIL) 250 MG tablet   Oral   Take 1 tablet (250 mg total) by mouth daily as needed.   30 tablet  5   . azithromycin (ZITHROMAX) 250 MG tablet   Oral   Take 1 tablet (250 mg total) by mouth daily. Take first 2 tablets together, then 1 every day until finished.   6 tablet   0   . estradiol cypionate (DEPO-ESTRADIOL) 5 MG/ML injection   Intramuscular   Inject into the muscle every 3 (three) months.         Marland Kitchen guaiFENesin-codeine 100-10 MG/5ML syrup   Oral   Take 10 mLs by mouth 4 (four) times daily as needed for cough.   120 mL   0   . sulfamethoxazole-trimethoprim (BACTRIM DS,SEPTRA DS) 800-160 MG per tablet   Oral   Take 1 tablet by mouth 2 (two) times daily.   20 tablet   0   . traZODone (DESYREL) 25 mg TABS   Oral   Take 25-50 mg by mouth at bedtime.          BP 116/83  Pulse 105  Temp(Src) 99 F (37.2 C) (Oral)  Resp 17  SpO2 99% Physical Exam  Nursing note and vitals reviewed. Constitutional: She is oriented to person, place, and time. She appears  well-developed and well-nourished.  HENT:  Head: Normocephalic and atraumatic.  Mouth/Throat: Oropharynx is clear and moist.  THROAT-SL. HYPEREMIC, NO EXUDATE NO CERVICAL ADENOPATHY  Eyes: Conjunctivae are normal. Pupils are equal, round, and reactive to light.  Neck: Normal range of motion. Neck supple.  Cardiovascular: Normal rate, regular rhythm, normal heart sounds and intact distal pulses.   No murmur heard. Pulmonary/Chest: Effort normal and breath sounds normal.  Abdominal: Soft. Bowel sounds are normal. She exhibits no distension and no mass. There is no tenderness.  Musculoskeletal: Normal range of motion.  Lymphadenopathy:    She has no cervical adenopathy.  Neurological: She is alert and oriented to person, place, and time. No cranial nerve deficit. She exhibits normal muscle tone. Coordination normal.  Skin: Skin is warm and dry.  Psychiatric: She has a normal mood and affect.    ED Course  Procedures (including critical care time) Labs Review Labs Reviewed - No data to display Imaging Review No results found.  MDM   1. Acute bronchitis       Jani Files, MD 12/31/12 1536

## 2012-12-31 NOTE — ED Notes (Signed)
Started with bilat ear pressure, facial pressure and swelling to maxillary sinus area, and decreased energy x approx 1 wk;  Over past 2 days has had intermittent productive cough with yellow sputum and feeling of inflammation in throat.  Temps have been 99.0 at home.  Has taken Dayquil and IBU.

## 2013-01-24 ENCOUNTER — Ambulatory Visit (INDEPENDENT_AMBULATORY_CARE_PROVIDER_SITE_OTHER): Payer: 59 | Admitting: *Deleted

## 2013-01-24 DIAGNOSIS — Z309 Encounter for contraceptive management, unspecified: Secondary | ICD-10-CM

## 2013-01-24 MED ORDER — MEDROXYPROGESTERONE ACETATE 150 MG/ML IM SUSP
150.0000 mg | Freq: Once | INTRAMUSCULAR | Status: AC
  Administered 2013-01-24: 150 mg via INTRAMUSCULAR

## 2013-02-22 ENCOUNTER — Encounter: Payer: Self-pay | Admitting: Family Medicine

## 2013-02-22 ENCOUNTER — Ambulatory Visit (INDEPENDENT_AMBULATORY_CARE_PROVIDER_SITE_OTHER): Payer: 59 | Admitting: Family Medicine

## 2013-02-22 VITALS — BP 112/76 | HR 94 | Temp 98.8°F | Wt 164.0 lb

## 2013-02-22 DIAGNOSIS — Z23 Encounter for immunization: Secondary | ICD-10-CM

## 2013-02-22 DIAGNOSIS — R350 Frequency of micturition: Secondary | ICD-10-CM

## 2013-02-22 LAB — POCT URINALYSIS DIPSTICK
Blood, UA: NEGATIVE
Leukocytes, UA: NEGATIVE
Nitrite, UA: NEGATIVE
Protein, UA: NEGATIVE
Urobilinogen, UA: 0.2
pH, UA: 6

## 2013-02-22 MED ORDER — CIPROFLOXACIN HCL 250 MG PO TABS: 250.0000 mg | ORAL_TABLET | Freq: Two times a day (BID) | ORAL | Status: DC

## 2013-02-22 NOTE — Progress Notes (Signed)
  Subjective:    Brooke [name] is a 42 y.o. female who complains of dysuria, frequency and suprapubic pressure. She has had symptoms for 1 week. Patient also complains of back pain. Patient denies congestion, cough, fever, headache, rhinitis, sorethroat, stomach ache and vaginal discharge. Patient does not have a history of recurrent UTI. Patient does not have a history of pyelonephritis.   The following portions of the patient's history were reviewed and updated as appropriate: allergies, current medications, past family history, past medical history, past social history, past surgical history and problem list.  Review of Systems Pertinent items are noted in HPI.    Objective:    BP 112/76  Pulse 94  Temp(Src) 98.8 F (37.1 C) (Oral)  Wt 164 lb (74.39 kg)  BMI 28.81 kg/m2  SpO2 97% General appearance: alert, cooperative, appears stated age and mild distress Abdomen: abnormal findings:  mild tenderness in the lower abdomen  Laboratory:  Urine dipstick: trace for leukocyte esterase.  ---neg for all else Micro exam: not done.    Assessment:    urinary frequency----r/o uti     Plan:    Medications: ciprofloxacin. Maintain adequate hydration. Follow up if symptoms not improving, and as needed. ---culture pending rto for pelvic if no better

## 2013-02-22 NOTE — Patient Instructions (Signed)
Urinary Tract Infection  Urinary tract infections (UTIs) can develop anywhere along your urinary tract. Your urinary tract is your body's drainage system for removing wastes and extra water. Your urinary tract includes two kidneys, two ureters, a bladder, and a urethra. Your kidneys are a pair of bean-shaped organs. Each kidney is about the size of your fist. They are located below your ribs, one on each side of your spine.  CAUSES  Infections are caused by microbes, which are microscopic organisms, including fungi, viruses, and bacteria. These organisms are so small that they can only be seen through a microscope. Bacteria are the microbes that most commonly cause UTIs.  SYMPTOMS   Symptoms of UTIs may vary by age and gender of the patient and by the location of the infection. Symptoms in young women typically include a frequent and intense urge to urinate and a painful, burning feeling in the bladder or urethra during urination. Older women and men are more likely to be tired, shaky, and weak and have muscle aches and abdominal pain. A fever may mean the infection is in your kidneys. Other symptoms of a kidney infection include pain in your back or sides below the ribs, nausea, and vomiting.  DIAGNOSIS  To diagnose a UTI, your caregiver will ask you about your symptoms. Your caregiver also will ask to provide a urine sample. The urine sample will be tested for bacteria and white blood cells. White blood cells are made by your body to help fight infection.  TREATMENT   Typically, UTIs can be treated with medication. Because most UTIs are caused by a bacterial infection, they usually can be treated with the use of antibiotics. The choice of antibiotic and length of treatment depend on your symptoms and the type of bacteria causing your infection.  HOME CARE INSTRUCTIONS   If you were prescribed antibiotics, take them exactly as your caregiver instructs you. Finish the medication even if you feel better after you  have only taken some of the medication.   Drink enough water and fluids to keep your urine clear or pale yellow.   Avoid caffeine, tea, and carbonated beverages. They tend to irritate your bladder.   Empty your bladder often. Avoid holding urine for long periods of time.   Empty your bladder before and after sexual intercourse.   After a bowel movement, women should cleanse from front to back. Use each tissue only once.  SEEK MEDICAL CARE IF:    You have back pain.   You develop a fever.   Your symptoms do not begin to resolve within 3 days.  SEEK IMMEDIATE MEDICAL CARE IF:    You have severe back pain or lower abdominal pain.   You develop chills.   You have nausea or vomiting.   You have continued burning or discomfort with urination.  MAKE SURE YOU:    Understand these instructions.   Will watch your condition.   Will get help right away if you are not doing well or get worse.  Document Released: 01/27/2005 Document Revised: 10/19/2011 Document Reviewed: 05/28/2011  Exit[employer] Patient Information 2014 [employer]

## 2013-02-25 LAB — URINE CULTURE

## 2013-02-26 ENCOUNTER — Other Ambulatory Visit: Payer: Self-pay

## 2013-02-26 DIAGNOSIS — Z1231 Encounter for screening mammogram for malignant neoplasm of breast: Secondary | ICD-10-CM

## 2013-03-05 ENCOUNTER — Ambulatory Visit (INDEPENDENT_AMBULATORY_CARE_PROVIDER_SITE_OTHER): Payer: 59 | Admitting: Family Medicine

## 2013-03-05 ENCOUNTER — Encounter: Payer: Self-pay | Admitting: Family Medicine

## 2013-03-05 VITALS — BP 120/72 | HR 98 | Temp 98.9°F | Wt 168.0 lb

## 2013-03-05 DIAGNOSIS — R7309 Other abnormal glucose: Secondary | ICD-10-CM

## 2013-03-05 DIAGNOSIS — Z23 Encounter for immunization: Secondary | ICD-10-CM

## 2013-03-05 DIAGNOSIS — R350 Frequency of micturition: Secondary | ICD-10-CM

## 2013-03-05 DIAGNOSIS — R739 Hyperglycemia, unspecified: Secondary | ICD-10-CM | POA: Insufficient documentation

## 2013-03-05 DIAGNOSIS — IMO0002 Reserved for concepts with insufficient information to code with codable children: Secondary | ICD-10-CM

## 2013-03-05 DIAGNOSIS — T148XXA Other injury of unspecified body region, initial encounter: Secondary | ICD-10-CM

## 2013-03-05 HISTORY — DX: Hyperglycemia, unspecified: R73.9

## 2013-03-05 LAB — POCT URINALYSIS DIPSTICK
Bilirubin, UA: NEGATIVE
Blood, UA: NEGATIVE
Glucose, UA: NEGATIVE
Ketones, UA: NEGATIVE
Nitrite, UA: NEGATIVE
Spec Grav, UA: 1.01
Urobilinogen, UA: 0.2

## 2013-03-05 LAB — BASIC METABOLIC PANEL
BUN: 8 mg/dL (ref 6–23)
GFR: 76.04 mL/min (ref >60.00)
Potassium: 3.8 mEq/L (ref 3.5–5.1)

## 2013-03-05 LAB — HEMOGLOBIN A1C: Hgb A1c MFr Bld: 5.5 % (ref 4.6–6.5)

## 2013-03-05 NOTE — Patient Instructions (Signed)

## 2013-03-05 NOTE — Progress Notes (Signed)
  Subjective:    Patient ID: Brooke [name], female    DOB: November 22, 1970, 42 y.o.   MRN: [id number]  HPI Pt here c/o elevated blood sugar over 300 over weekend.  She also has a abrasion on her knee that is not healing and has urinary frequency.   No fever, no dysuria.   Review of Systems    as above Objective:   Physical Exam  BP 120/72  Pulse 98  Temp(Src) 98.9 F (37.2 C) (Oral)  Wt 168 lb (76.204 kg)  SpO2 99% General appearance: alert, cooperative, appears stated age and no distress Lungs: clear to auscultation bilaterally Heart: S1, S2 normal Skin: abrasion R knee, no infection Psych-- no depression, no anxiety      Assessment & Plan:

## 2013-03-05 NOTE — Assessment & Plan Note (Signed)
Check labs Gave diet infor rto prn

## 2013-03-06 LAB — MICROALBUMIN / CREATININE URINE RATIO
Microalb Creat Ratio: 1.4 mg/g (ref 0.0–30.0)
Microalb, Ur: 0.6 mg/dL (ref 0.0–1.9)

## 2013-03-07 LAB — URINE CULTURE: Colony Count: 10000

## 2013-03-20 ENCOUNTER — Ambulatory Visit: Payer: 59

## 2013-04-19 ENCOUNTER — Ambulatory Visit: Payer: 59

## 2013-04-23 ENCOUNTER — Ambulatory Visit (INDEPENDENT_AMBULATORY_CARE_PROVIDER_SITE_OTHER): Payer: 59

## 2013-04-23 DIAGNOSIS — Z309 Encounter for contraceptive management, unspecified: Secondary | ICD-10-CM

## 2013-04-23 MED ORDER — MEDROXYPROGESTERONE ACETATE 150 MG/ML IM SUSP
150.0000 mg | Freq: Once | INTRAMUSCULAR | Status: AC
  Administered 2013-04-23: 150 mg via INTRAMUSCULAR

## 2013-04-23 MED ORDER — MEDROXYPROGESTERONE ACETATE 150 MG/ML IM SUSP: 150.0000 mg | INTRAMUSCULAR | Status: DC

## 2013-05-01 ENCOUNTER — Ambulatory Visit: Payer: 59

## 2013-05-04 ENCOUNTER — Telehealth: Payer: Self-pay

## 2013-05-04 NOTE — Telephone Encounter (Signed)
Medication and allergies:  Reviewed and updated  90 day supply/mail order: na Local pharmacy: [facility]   Immunizations due:UTD    A/P:   No changes to FH or PSH or personal hx Pap--09/2010--nml MMG--01/2013-no report on file  To Discuss with Provider: Not at this time

## 2013-05-07 ENCOUNTER — Ambulatory Visit: Payer: 59

## 2013-05-07 ENCOUNTER — Encounter: Payer: 59 | Admitting: Family Medicine

## 2013-05-25 ENCOUNTER — Ambulatory Visit: Payer: 59

## 2013-05-27 ENCOUNTER — Encounter (HOSPITAL_COMMUNITY): Payer: Self-pay | Admitting: Emergency Medicine

## 2013-05-27 ENCOUNTER — Emergency Department (INDEPENDENT_AMBULATORY_CARE_PROVIDER_SITE_OTHER): Payer: 59

## 2013-05-27 ENCOUNTER — Emergency Department (HOSPITAL_COMMUNITY)
Admission: EM | Admit: 2013-05-27 | Discharge: 2013-05-27 | Disposition: A | Discharge status: Home / Self Care | Payer: 59 | Source: Home / Self Care | Attending: Family Medicine | Admitting: Family Medicine

## 2013-05-27 DIAGNOSIS — S63502A Unspecified sprain of left wrist, initial encounter: Secondary | ICD-10-CM

## 2013-05-27 DIAGNOSIS — S63509A Unspecified sprain of unspecified wrist, initial encounter: Secondary | ICD-10-CM

## 2013-05-27 NOTE — Discharge Instructions (Signed)
Wear splint for comfort, warm soaks 1-2  times a day. Return as needed.

## 2013-05-27 NOTE — ED Provider Notes (Signed)
CSN: [id number]     Arrival date & time 05/27/13  0907 History   First MD Initiated Contact with Patient 05/27/13 [phone]     Chief Complaint  Patient presents with  . Fall   (Consider location/radiation/quality/duration/timing/severity/associated sxs/prior Treatment) Patient is a 43 y.o. female presenting with fall. The history is provided by the patient.  Fall This is a new problem. The current episode started yesterday (tripped over dog and landed on left wrist, c/o pain and sts, no elbow ,hand or shoulder pain.). The problem has not changed since onset.Associated symptoms comments: Tetanus utd..    Past Medical History  Diagnosis Date  . Depression   . Incontinence    Past Surgical History  Procedure Laterality Date  . Rhinoplasty    . Tonsillectomy    . Sinus endo w/fusion     Family History  Problem Relation Age of Onset  . Thyroid disease Mother   . Diabetes Father   . Hypertension Father   . Hypertension Paternal Uncle   . Diabetes Paternal Grandmother   . Diabetes Paternal Aunt    History  Substance Use Topics  . Smoking status: Never Smoker   . Smokeless tobacco: Never Used  . Alcohol Use: No   OB History   Grav Para Term Preterm Abortions TAB SAB Ect Mult Living                 Review of Systems  Constitutional: Negative.   Musculoskeletal: Positive for joint swelling. Negative for back pain and gait problem.  Skin: Positive for color change.    Allergies  Review of patient's allergies indicates no known allergies.  Home Medications   Current Outpatient Rx  Name  Route  Sig  Dispense  Refill  . ALPRAZolam (XANAX) 0.5 MG tablet   Oral   Take by mouth at bedtime as needed. Take 2 tab at bedtime          . ARIPiprazole (ABILIFY) 5 MG tablet   Oral   Take 2.5 mg by mouth daily.           . Armodafinil (NUVIGIL) 250 MG tablet   Oral   Take 1 tablet (250 mg total) by mouth daily as needed.   30 tablet   5   . estradiol cypionate  (DEPO-ESTRADIOL) 5 MG/ML injection   Intramuscular   Inject into the muscle every 3 (three) months.         Marland Kitchen PARoxetine (PAXIL) 20 MG tablet   Oral   Take 80 mg by mouth every morning.           . topiramate (TOPAMAX) 100 MG tablet   Oral   Take 100 mg by mouth daily.           . traZODone (DESYREL) 25 mg TABS   Oral   Take 25-50 mg by mouth at bedtime.         Marland Kitchen zolpidem (AMBIEN) 5 MG tablet   Oral   Take 5 mg by mouth at bedtime as needed for sleep.          BP 115/77  Pulse 82  Temp(Src) 98.2 F (36.8 C) (Oral)  Resp 18  SpO2 100% Physical Exam  Nursing note and vitals reviewed. Constitutional: She is oriented to person, place, and time. She appears well-developed and well-nourished. No distress.  Musculoskeletal: She exhibits tenderness.       Left wrist: She exhibits decreased range of motion and tenderness. She exhibits no bony  tenderness and no deformity.  Neurological: She is alert and oriented to person, place, and time.  Skin: Skin is warm and dry.  Tender ecchymosis , abrasion to left volar wrist,     ED Course  Procedures (including critical care time) Labs Review Labs Reviewed - No data to display Imaging Review Dg Wrist Complete Left  05/27/2013   CLINICAL DATA:  Fall, radial left wrist pain  EXAM: LEFT WRIST - COMPLETE 3+ VIEW  COMPARISON:  None.  FINDINGS: There is no evidence of fracture or dislocation. There is no evidence of arthropathy or other focal bone abnormality. Soft tissues are unremarkable.  IMPRESSION: Negative.   Electronically Signed   By: Conchita Paris M.D.   On: 05/27/2013 10:12    EKG Interpretation    Date/Time:    Ventricular Rate:    PR Interval:    QRS Duration:   QT Interval:    QTC Calculation:   R Axis:     Text Interpretation:              MDM  X-rays reviewed and report per radiologist.     Billy Fischer, MD 05/27/13 [phone]

## 2013-05-27 NOTE — ED Notes (Signed)
.  fal

## 2013-05-27 NOTE — ED Notes (Signed)
Fell    Last    Pm  And  inj  To  l     Wrist     Bruising  Present  No  Obvious  Deformity         Slight  Break in  Scan      Noted

## 2013-08-05 ENCOUNTER — Emergency Department (HOSPITAL_COMMUNITY)
Admission: EM | Admit: 2013-08-05 | Discharge: 2013-08-05 | Disposition: A | Discharge status: Home / Self Care | Payer: 59 | Source: Home / Self Care | Attending: Emergency Medicine | Admitting: Emergency Medicine

## 2013-08-05 ENCOUNTER — Encounter (HOSPITAL_COMMUNITY): Payer: Self-pay | Admitting: Emergency Medicine

## 2013-08-05 DIAGNOSIS — N390 Urinary tract infection, site not specified: Secondary | ICD-10-CM

## 2013-08-05 LAB — POCT URINALYSIS DIP (DEVICE)
BILIRUBIN URINE: NEGATIVE
Glucose, UA: NEGATIVE mg/dL
Ketones, ur: NEGATIVE mg/dL
NITRITE: NEGATIVE
PH: 6 (ref 5.0–8.0)
Protein, ur: NEGATIVE mg/dL
SPECIFIC GRAVITY, URINE: 1.025 (ref 1.005–1.030)
Urobilinogen, UA: 1 mg/dL (ref 0.0–1.0)

## 2013-08-05 MED ORDER — NITROFURANTOIN MONOHYD MACRO 100 MG PO CAPS: 100.0000 mg | ORAL_CAPSULE | Freq: Two times a day (BID) | ORAL | Status: DC

## 2013-08-05 NOTE — ED Provider Notes (Signed)
Medical screening examination/treatment/procedure(s) were performed by non-physician practitioner and as supervising physician I was immediately available for consultation/collaboration.  Philipp Deputy, M.D.  Harden Mo, MD 08/05/13 [phone]

## 2013-08-05 NOTE — ED Provider Notes (Signed)
CSN: [id number]     Arrival date & time 08/05/13  0902 History   First MD Initiated Contact with Patient 08/05/13 [phone]     Chief Complaint  Patient presents with  . Urinary Frequency   (Consider location/radiation/quality/duration/timing/severity/associated sxs/prior Treatment) Patient is a 43 y.o. female presenting with frequency. The history is provided by the patient.  Urinary Frequency This is a new problem. Episode onset: 3 days. The problem occurs constantly. The problem has been gradually worsening. Pertinent negatives include no abdominal pain. Associated symptoms comments: +dysuria, hematuria, urgency with suprapubic pressure when emptying bladder.    Past Medical History  Diagnosis Date  . Depression   . Incontinence    Past Surgical History  Procedure Laterality Date  . Rhinoplasty    . Tonsillectomy    . Sinus endo w/fusion     Family History  Problem Relation Age of Onset  . Thyroid disease Mother   . Diabetes Father   . Hypertension Father   . Hypertension Paternal Uncle   . Diabetes Paternal Grandmother   . Diabetes Paternal Aunt    History  Substance Use Topics  . Smoking status: Never Smoker   . Smokeless tobacco: Never Used  . Alcohol Use: No   OB History   Grav Para Term Preterm Abortions TAB SAB Ect Mult Living                 Review of Systems  Constitutional: Negative.   HENT: Negative.   Respiratory: Negative.   Cardiovascular: Negative.   Gastrointestinal: Negative for nausea, vomiting, abdominal pain, diarrhea and constipation.  Endocrine: Negative for polydipsia, polyphagia and polyuria.  Genitourinary: Positive for dysuria, urgency, frequency and hematuria. Negative for flank pain, vaginal bleeding, vaginal discharge, difficulty urinating, genital sores, vaginal pain and pelvic pain.  Musculoskeletal: Negative for back pain.  Allergic/Immunologic: Negative for immunocompromised state.    Allergies  Review of patient's allergies  indicates no known allergies.  Home Medications   Current Outpatient Rx  Name  Route  Sig  Dispense  Refill  . ALPRAZolam (XANAX) 0.5 MG tablet   Oral   Take by mouth at bedtime as needed. Take 2 tab at bedtime          . ARIPiprazole (ABILIFY) 5 MG tablet   Oral   Take 2.5 mg by mouth daily.           . Armodafinil (NUVIGIL) 250 MG tablet   Oral   Take 1 tablet (250 mg total) by mouth daily as needed.   30 tablet   5   . estradiol cypionate (DEPO-ESTRADIOL) 5 MG/ML injection   Intramuscular   Inject into the muscle every 3 (three) months.         . nitrofurantoin, macrocrystal-monohydrate, (MACROBID) 100 MG capsule   Oral   Take 1 capsule (100 mg total) by mouth 2 (two) times daily.   10 capsule   0   . PARoxetine (PAXIL) 20 MG tablet   Oral   Take 80 mg by mouth every morning.           . topiramate (TOPAMAX) 100 MG tablet   Oral   Take 100 mg by mouth daily.           . traZODone (DESYREL) 25 mg TABS   Oral   Take 25-50 mg by mouth at bedtime.         Marland Kitchen zolpidem (AMBIEN) 5 MG tablet   Oral   Take 5 mg by  mouth at bedtime as needed for sleep.          BP 99/72  Pulse 96  Temp(Src) 97.2 F (36.2 C) (Oral)  Resp 16  SpO2 98% Physical Exam  Nursing note and vitals reviewed. Constitutional: She is oriented to person, place, and time. She appears well-developed and well-nourished. No distress.  HENT:  Head: Normocephalic and atraumatic.  Eyes: Conjunctivae are normal.  Cardiovascular: Normal rate, regular rhythm and normal heart sounds.   Pulmonary/Chest: Effort normal and breath sounds normal.  Abdominal: Soft. Bowel sounds are normal. She exhibits no distension. There is no tenderness. There is no CVA tenderness.  Musculoskeletal: Normal range of motion.  Neurological: She is alert and oriented to person, place, and time.  Skin: Skin is warm and dry. No rash noted.  Psychiatric: She has a normal mood and affect. Her behavior is normal.     ED Course  Procedures (including critical care time) Labs Review Labs Reviewed  POCT URINALYSIS DIP (DEVICE) - Abnormal; Notable for the following:    Hgb urine dipstick MODERATE (*)    Leukocytes, UA LARGE (*)    All other components within normal limits  URINE CULTURE   Imaging Review No results found.   MDM   1. UTI (lower urinary tract infection)   Increase fluid, macrobid as prescribed. No clinical indication of ascending infection (pyelonephritis). PCP follow up if no improvement. Specimen sent for C&S.   [place], [place] 08/05/13 [phone]

## 2013-08-05 NOTE — ED Notes (Signed)
Pt  Reports  Symptoms of a  uti  To  Include      Frequency  Urgency  Discomfort  And  Slight  Amt ogf blood     Symptoms  X  3  Days

## 2013-08-05 NOTE — ED Notes (Signed)
Pt  Reports  Symptoms  Of

## 2013-08-07 LAB — URINE CULTURE
Colony Count: >=100000
Special Requests: NORMAL

## 2013-08-07 NOTE — ED Notes (Signed)
Urine culture: >100,000 colonies E. Coli. Pt. adequately treated with Macrobid. Brooke [name] 08/07/2013

## 2013-09-07 ENCOUNTER — Telehealth: Payer: Self-pay | Admitting: Internal Medicine

## 2013-09-07 ENCOUNTER — Ambulatory Visit (INDEPENDENT_AMBULATORY_CARE_PROVIDER_SITE_OTHER): Payer: 59 | Admitting: Internal Medicine

## 2013-09-07 ENCOUNTER — Encounter: Payer: Self-pay | Admitting: Internal Medicine

## 2013-09-07 VITALS — BP 109/74 | HR 101 | Temp 98.4°F | Wt 167.0 lb

## 2013-09-07 DIAGNOSIS — L259 Unspecified contact dermatitis, unspecified cause: Secondary | ICD-10-CM

## 2013-09-07 MED ORDER — PREDNISONE 10 MG PO TABS: ORAL_TABLET | ORAL | Status: DC

## 2013-09-07 MED ORDER — TRIAMCINOLONE ACETONIDE 0.025 % EX LOTN: TOPICAL_LOTION | CUTANEOUS | Status: DC

## 2013-09-07 NOTE — Progress Notes (Signed)
Pre visit review using our clinic review tool, if applicable. No additional management support is needed unless otherwise documented below in the visit note. 

## 2013-09-07 NOTE — Telephone Encounter (Signed)
Caller name: Ailene Ravel Relation to pt: pharmacy tech Call back number: [phone] Pharmacy:  [facility]  Reason for call:   Ailene Ravel states that they are out of the Triamcinolone Acetonide 0.025 % LOTN and wants to get an order for a cream.  Pt is there waiting.

## 2013-09-07 NOTE — Telephone Encounter (Signed)
Spoke with Ailene Ravel and advised ok to change to cream.

## 2013-09-07 NOTE — Patient Instructions (Signed)
Take the prednisone as prescribed Use kenalog lotion  as needed for itching Take Claritin OTC 1 tab a day as needed for itching as as well  Check your blood sugars daily for few days, if the  sugars are more than 180, stop prednisone and let us know

## 2013-09-07 NOTE — Progress Notes (Signed)
   Subjective:    Patient ID: Brooke [name], female    DOB: 10-13-1970, 43 y.o.   MRN: [id number]  DOS:  09/07/2013 Type of  visit: Acute visit Developed a itchy rash on both arms distal from the elbow, Approximately 5 days ago. This happened a few days after she worked in the yard and was exposed to [place] or poison oak. Using OTC calamine lotion   ROS Other than that she feels well, no fever or chills. No rash anywhere else. No arthralgias or ST.  Past Medical History  Diagnosis Date  . Depression   . Incontinence   . Hyperglycemia 03/05/2013    Past Surgical History  Procedure Laterality Date  . Rhinoplasty    . Tonsillectomy    . Sinus endo w/fusion      History   Social History  . Marital Status: Married    Spouse Name: N/A    Number of Children: N/A  . Years of Education: 16   Occupational History  . RN/OR [employer]   Social History Main Topics  . Smoking status: Never Smoker   . Smokeless tobacco: Never Used  . Alcohol Use: No  . Drug Use: No  . Sexual Activity: Yes    Partners: Male   Other Topics Concern  . Not on file   Social History Narrative  . No narrative on file        Medication List       This list is accurate as of: 09/07/13 11:59 PM.  Always use your most recent med list.               ABILIFY 5 MG tablet  Generic drug:  ARIPiprazole  Take 2.5 mg by mouth daily.     ALPRAZolam 0.5 MG tablet  Commonly known as:  XANAX  Take by mouth at bedtime as needed. Take 2 tab at bedtime     AMBIEN 5 MG tablet  Generic drug:  zolpidem  Take 5 mg by mouth at bedtime as needed for sleep.     PAXIL 20 MG tablet  Generic drug:  PARoxetine  Take 80 mg by mouth every morning.     predniSONE 10 MG tablet  Commonly known as:  DELTASONE  3 tabs x 3 days, 2 tabs x 3 days, 1 tab x 3 days     TOPAMAX 100 MG tablet  Generic drug:  topiramate  Take 100 mg by mouth daily.     traZODone 25 mg Tabs tablet  Commonly known as:   DESYREL  Take 25-50 mg by mouth at bedtime.     Triamcinolone Acetonide 0.025 % Lotn  Apply twice a day as needed           Objective:   Physical Exam BP 109/74  Pulse 101  Temp(Src) 98.4 F (36.9 C) (Oral)  Wt 167 lb (75.751 kg)  SpO2 99% General -- alert, well-developed, NAD.   Extremities-- multiple skin lesion, some linear, maculopapular, red; no cellulitis type of changes Psych-- Cognition and judgment appear intact. Cooperative with normal attention span and concentration. No anxious or depressed appearing.         Assessment & Plan:    Contact dermatitis, Symptoms consistent with contact dermatitis. RX Prednisone, kenalog, Claritin. The patient has prediabetes, she does have a glucometer at home, see instructions

## 2013-11-28 ENCOUNTER — Ambulatory Visit: Payer: 59 | Admitting: Medical

## 2013-11-28 ENCOUNTER — Ambulatory Visit (INDEPENDENT_AMBULATORY_CARE_PROVIDER_SITE_OTHER): Payer: 59 | Admitting: Medical

## 2013-11-28 ENCOUNTER — Ambulatory Visit (HOSPITAL_BASED_OUTPATIENT_CLINIC_OR_DEPARTMENT_OTHER)
Admission: RE | Admit: 2013-11-28 | Discharge: 2013-11-28 | Disposition: A | Discharge status: Home / Self Care | Payer: 59 | Source: Ambulatory Visit | Attending: Medical | Admitting: Medical

## 2013-11-28 VITALS — BP 99/68 | HR 97 | Temp 98.5°F | Wt 169.0 lb

## 2013-11-28 DIAGNOSIS — M5489 Other dorsalgia: Secondary | ICD-10-CM

## 2013-11-28 DIAGNOSIS — M8448XA Pathological fracture, other site, initial encounter for fracture: Secondary | ICD-10-CM | POA: Insufficient documentation

## 2013-11-28 DIAGNOSIS — M549 Dorsalgia, unspecified: Secondary | ICD-10-CM

## 2013-11-28 DIAGNOSIS — R350 Frequency of micturition: Secondary | ICD-10-CM

## 2013-11-28 LAB — POCT URINALYSIS DIPSTICK
Bilirubin, UA: NEGATIVE
GLUCOSE UA: NEGATIVE
Ketones, UA: NEGATIVE
NITRITE UA: NEGATIVE
PH UA: 7
Spec Grav, UA: <=1.005
Urobilinogen, UA: 0.2

## 2013-11-28 LAB — POCT URINE PREGNANCY: Preg Test, Ur: NEGATIVE

## 2013-11-28 MED ORDER — KETOROLAC TROMETHAMINE 60 MG/2ML IM SOLN
60.0000 mg | Freq: Once | INTRAMUSCULAR | Status: AC
  Administered 2013-11-28: 60 mg via INTRAMUSCULAR

## 2013-11-28 MED ORDER — DICLOFENAC SODIUM 75 MG PO TBEC: 75.0000 mg | DELAYED_RELEASE_TABLET | Freq: Two times a day (BID) | ORAL | Status: DC

## 2013-11-28 NOTE — Assessment & Plan Note (Signed)
Urine test neg. If symptoms change or worsen notify us. Pregnancy test neg as well.

## 2013-11-28 NOTE — Progress Notes (Signed)
Pre visit review using our clinic review tool, if applicable. No additional management support is needed unless otherwise documented below in the visit note. 

## 2013-11-28 NOTE — Progress Notes (Signed)
   Subjective:    Patient ID: Brooke [name], female    DOB: Mar 02, 1971, 43 y.o.   MRN: [id number]  HPI  Pt has middle of lumbar/thoracic junction area pain. Pain off and on past 2-3 months. Pain usually comes and goes. Random occurrence. Activity can make worse but sometime just wakes up and has pain. Pt tried motrin otc 800 mg q day. Last week has been worse. No trauma history. 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 Lmp- Stopped depo in march. Has not started menses since. Pt sates no chance of pregnancy but just recently husband vasectomy. No radicular pain to extremities. But mild twinge to rt hip area the other day. No leg weakness. No saddle anesthesia.  Average level of pain  Past week was level 4 but she states she has high pain tolerance.  Some frequent urination and full feeling to bladder.    Review of Systems  Constitutional: Negative for fever, chills and fatigue.  HENT: Negative.   Respiratory: Negative for cough, chest tightness, shortness of breath and wheezing.   Genitourinary: Positive for frequency. Negative for dysuria, urgency, flank pain and difficulty urinating.  Musculoskeletal: Positive for back pain.  Skin: Negative.   Neurological: Negative.        No lower ext radicular pain or weakness.  Hematological: Negative for adenopathy.       Objective:   Physical Exam  Constitutional: She is oriented to person, place, and time. She appears well-developed and well-nourished. No distress.  Neck: Normal range of motion. Neck supple.  Cardiovascular: Normal rate, regular rhythm and normal heart sounds.   Pulmonary/Chest: Effort normal and breath sounds normal. No respiratory distress. She has no wheezes. She has no rales. She exhibits no tenderness.  Abdominal: Soft. Bowel sounds are normal. She exhibits no distension and no mass. There is no tenderness. There is no rebound and no guarding.  Musculoskeletal:  Mid lumbar and thoracic junction  tender. Mild pain on straight leg lift.   Neurological: She is alert and oriented to person, place, and time. No cranial nerve deficit.  Lower ext l5-s1 sensation intact bilaterally. No foot drop biltterally. Lower ext strength 5/5 symetric.  Skin: She is not diaphoretic.  Psychiatric: She has a normal mood and affect.    .        Assessment & Plan:

## 2013-11-28 NOTE — Patient Instructions (Signed)
Please take diclofenac starting tomorrow and discontinue otc nsaids. Back stretching exercises at home as tolerated. Please get xray within next couple of days. If pain worsens or change regarding mid pack pain please let us know. If urinary frequency worsens or other urinary symptoms let us know. Today urinalysis was negative. Follow up 7-10 days or prn.  Mid-Back Strain with Rehab  A strain is an injury in which a tendon or muscle is torn. The muscles and tendons of the mid-back are vulnerable to strains. However, these muscles and tendons are very strong and require a great force to be injured. The muscles of the mid-back are responsible for stabilizing the spinal column, as well as spinal twisting (rotation). Strains are classified into three categories. Grade 1 strains cause pain, but the tendon is not lengthened. Grade 2 strains include a lengthened ligament, due to the ligament being stretched or partially ruptured. With grade 2 strains there is still function, although the function may be decreased. Grade 3 strains involve a complete tear of the tendon or muscle, and function is usually impaired. SYMPTOMS   Pain in the middle of the back.  Pain that may affect only one side, and is worse with movement.  Muscle spasms, and often swelling in the back.  Loss of strength of the back muscles.  Crackling sound (crepitation) when the muscles are touched. CAUSES  Mid-back strains occur when a force is placed on the muscles or tendons that is greater than they can handle. Common causes of injury include:  Ongoing overuse of the muscle-tendon units in the middle back, usually from incorrect body posture.  A single violent injury or force applied to the back. RISK INCREASES WITH:  Sports that involve twisting forces on the spine or a lot of bending at the waist (football, rugby, weightlifting, bowling, golf, tennis, speed skating, racquetball, swimming, running, gymnastics, diving).  Poor  strength and flexibility.  Failure to warm up properly before activity.  Family history of low back pain or disk disorders.  Previous back injury or surgery (especially fusion). PREVENTION  Learn and use proper sports technique.  Warm up and stretch properly before activity.  Allow for adequate recovery between workouts.  Maintain physical fitness:  Strength, flexibility, and endurance.  Cardiovascular fitness. PROGNOSIS  If treated properly, mid-back strains usually heal within 6 weeks. RELATED COMPLICATIONS   Frequently recurring symptoms, resulting in a chronic problem. Properly treating the problem the first time decreases frequency of recurrence.  Chronic inflammation, scarring, and partial muscle-tendon tear.  Delayed healing or resolution of symptoms, especially if activity is resumed too soon.  Prolonged disability. TREATMENT Treatment first involves the use of ice and medicine, to reduce pain and inflammation. As the pain begins to subside, you may begin strengthening and stretching exercises to improve body posture and sport technique. These exercises may be performed at home or with a therapist. Severe injuries may require referral to a therapist for further evaluation and treatment, such as ultrasound. Corticosteroid injections may be given to help reduce inflammation. Biofeedback (watching monitors of your body processes) and psychotherapy may also be prescribed. Prolonged bed rest is felt to do more harm than good. Massage may help break the muscle spasms. Sometimes, an injection of cortisone, with or without local anesthetics, may be given to help relieve the pain and spasms. MEDICATION   If pain medicine is needed, nonsteroidal anti-inflammatory medicines (aspirin and ibuprofen), or other minor pain relievers (acetaminophen), are often advised.  Do not take pain medicine  for 7 days before surgery.  Prescription pain relievers may be given, if your caregiver  thinks they are needed. Use only as directed and only as much as you need.  Ointments applied to the skin may be helpful.  Corticosteroid injections may be given by your caregiver. These injections should be reserved for the most serious cases, because they may only be given a certain number of times. HEAT AND COLD:   Cold treatment (icing) should be applied for 10 to 15 minutes every 2 to 3 hours for inflammation and pain, and immediately after activity that aggravates your symptoms. Use ice packs or an ice massage.  Heat treatment may be used before performing stretching and strengthening activities prescribed by your caregiver, physical therapist, or athletic trainer. Use a heat pack or a warm water soak. SEEK IMMEDIATE MEDICAL CARE IF:  Symptoms get worse or do not improve in 2 to 4 weeks, despite treatment.  You develop numbness, weakness, or loss of bowel or bladder function.  New, unexplained symptoms develop. (Drugs used in treatment may produce side effects.) EXERCISES RANGE OF MOTION (ROM) AND STRETCHING EXERCISES - Mid-Back Strain These exercises may help you when beginning to rehabilitate your injury. In order to successfully resolve your symptoms, you must improve your posture. These exercises are designed to help reduce the forward-head and rounded-shoulder posture which contributes to this condition. Your symptoms may resolve with or without further involvement from your physician, physical therapist or athletic trainer. While completing these exercises, remember:   Restoring tissue flexibility helps normal motion to return to the joints. This allows healthier, less painful movement and activity.  An effective stretch should be held for at least 30 seconds.  A stretch should never be painful. You should only feel a gentle lengthening or release in the stretched tissue. STRETCH - Axial Extension  Stand or sit on a firm surface. Assume a good posture: chest up, shoulders  drawn back, stomach muscles slightly tense, knees unlocked (if standing) and feet hip width apart.  Slowly retract your chin, so your head slides back and your chin slightly lowers. Continue to look straight ahead.  You should feel a gentle stretch in the back of your head. Be certain not to feel an aggressive stretch since this can cause headaches later.  Hold for __________ seconds. Repeat __________ times. Complete this exercise __________ times per day. RANGE OF MOTION- Upper Thoracic Extension  Sit on a firm chair with a high back. Assume a good posture: chest up, shoulders drawn back, abdominal muscles slightly tense, and feet hip width apart. Place a small pillow or folded towel in the curve of your lower back, if you are having difficulty maintaining good posture.  Gently brace your neck with your hands, allowing your arms to rest on your chest.  Continue to support your neck and slowly extend your back over the chair. You will feel a stretch across your upper back.  Hold __________ seconds. Slowly return to the starting position. Repeat __________ times. Complete this exercise __________ times per day. RANGE OF MOTION- Mid-Thoracic Extension  Roll a towel so that it is about 4 inches in diameter.  Position the towel lengthwise. Lay on the towel so that your spine, but not your shoulder blades, are supported.  You should feel your mid-back arching toward the floor. To increase the stretch, extend your arms away from your body.  Hold for __________ seconds. Repeat exercise __________ times, __________ times per day. STRENGTHENING EXERCISES - Mid-Back Strain  These exercises may help you when beginning to rehabilitate your injury. They may resolve your symptoms with or without further involvement from your physician, physical therapist or athletic trainer. While completing these exercises, remember:   Muscles can gain both the endurance and the strength needed for everyday  activities through controlled exercises.  Complete these exercises as instructed by your physician, physical therapist or athletic trainer. Increase the resistance and repetitions only as guided by your caregiver.  You may experience muscle soreness or fatigue, but the pain or discomfort you are trying to eliminate should never worsen during these exercises. If this pain does worsen, stop and make certain you are following the directions exactly. If the pain is still present after adjustments, discontinue the exercise until you can discuss the trouble with your caregiver. STRENGTHENING - Quadruped, Opposite UE/LE Lift  Assume a hands and knees position on a firm surface. Keep your hands under your shoulders and your knees under your hips. You may place padding under your knees for comfort.  Find your neutral spine and gently tense your abdominal muscles so that you can maintain this position. Your shoulders and hips should form a rectangle that is parallel with the floor and is not twisted.  Keeping your trunk steady, lift your right hand no higher than your shoulder and then your left leg no higher than your hip. Make sure you are not holding your breath. Hold this position __________ seconds.  Continuing to keep your abdominal muscles tense and your back steady, slowly return to your starting position. Repeat with the opposite arm and leg. Repeat __________ times. Complete this exercise __________ times per day.  STRENGTH - Shoulder Extensors  Secure a rubber exercise band or tubing to a fixed object (table, pole) so that it is at the height of your shoulders when you are either standing, or sitting on a firm armless chair.  With a thumbs-up grip, grasp an end of the band in each hand. Straighten your elbows and lift your hands straight in front of you at shoulder height. Step back away from the secured end of band, until it becomes tense.  Squeezing your shoulder blades together, pull your  hands down to the sides of your thighs. Do not allow your hands to go behind you.  Hold for __________ seconds. Slowly ease the tension on the band, as you reverse the directions and return to the starting position. Repeat __________ times. Complete this exercise __________ times per day.  STRENGTH - Horizontal Abductors Choose one of the two positions to complete this exercise. Prone: lying on stomach:  Lie on your stomach on a firm surface so that your right / left arm overhangs the edge. Rest your forehead on your opposite forearm. With your palm facing the floor and your elbow straight, hold a __________ weight in your hand.  Squeeze your right / left shoulder blade to your mid-back spine and then slowly raise your arm to the height of the bed.  Hold for __________ seconds. Slowly reverse the directions and return to the starting position, controlling the weight as you lower your arm. Repeat __________ times. Complete this exercise __________ times per day. Standing:   Secure a rubber exercise band or tubing, so that it is at the height of your shoulders when you are either standing, or sitting on a firm armless chair.  Grasp an end of the band in each hand and have your palms face each other. Straighten your elbows and lift your hands  straight in front of you at shoulder height. Step back away from the secured end of band, until it becomes tense.  Squeeze your shoulder blades together. Keeping your elbows locked and your hands at shoulder height, spread your arms apart, forming a "T" shape with your body. Hold __________ seconds. Slowly ease the tension on the band, as you reverse the directions and return to the starting position. Repeat __________ times. Complete this exercise __________ times per day. STRENGTH - Scapular Retractors and External Rotators, Rowing  Secure a rubber exercise band or tubing, so that it is at the height of your shoulders when you are either standing, or  sitting on a firm armless chair.  With a palm-down grip, grasp an end of the band in each hand. Straighten your elbows and lift your hands straight in front of you at shoulder height. Step back away from the secured end of band, until it becomes tense.  Step 1: Squeeze your shoulder blades together. Bending your elbows, draw your hands to your chest as if you are rowing a boat. At the end of this motion, your hands and elbow should be at shoulder height and your elbows should be out to your sides.  Step 2: Rotate your shoulder to raise your hands above your head. Your forearms should be vertical and your upper arms should be horizontal.  Hold for __________ seconds. Slowly ease the tension on the band, as you reverse the directions and return to the starting position. Repeat __________ times. Complete this exercise __________ times per day.  POSTURE AND BODY MECHANICS CONSIDERATIONS - Mid-Back Strain Keeping correct posture when sitting, standing or completing your activities will reduce the stress put on different body tissues, allowing injured tissues a chance to heal and limiting painful experiences. The following are general guidelines for improved posture. Your physician or physical therapist will provide you with any instructions specific to your needs. While reading these guidelines, remember:  The exercises prescribed by your provider will help you have the flexibility and strength to maintain correct postures.  The correct posture provides the best environment for your joints to work. All of your joints have less wear and tear when properly supported by a spine with good posture. This means you will experience a healthier, less painful body.  Correct posture must be practiced with all of your activities, especially prolonged sitting and standing. Correct posture is as important when doing repetitive low-stress activities (typing) as it is when doing a single heavy-load activity  (lifting). PROPER SITTING POSTURE In order to minimize stress and discomfort on your spine, you must sit with correct posture. Sitting with good posture should be effortless for a healthy body. Returning to good posture is a gradual process. Many people can work toward this most comfortably by using various supports until they have the flexibility and strength to maintain this posture on their own. When sitting with proper posture, your ears will fall over your shoulders and your shoulders will fall over your hips. You should use the back of the chair to support your upper back. Your lower back will be in a neutral position, just slightly arched. You may place a small pillow or folded towel at the base of your low back for  support.  When working at a desk, create an environment that supports good, upright posture. Without extra support, muscles fatigue and lead to excessive strain on joints and other tissues. Keep these recommendations in mind: CHAIR:  A chair should be able  to slide under your desk when your back makes contact with the back of the chair. This allows you to work closely.  The chair's height should allow your eyes to be level with the upper part of your monitor and your hands to be slightly lower than your elbows. BODY POSITION  Your feet should make contact with the floor. If this is not possible, use a foot rest.  Keep your ears over your shoulders. This will reduce stress on your neck and lower back. INCORRECT SITTING POSTURES If you are feeling tired and unable to assume a healthy sitting posture, do not slouch or slump. This puts excessive strain on your back tissues, causing more damage and pain. Healthier options include:  Using more support, like a lumbar pillow.  Switching tasks to something that requires you to be upright or walking.  Talking a brief walk.  Lying down to rest in a neutral-spine position. CORRECT STANDING POSTURES Proper standing posture should be  assumed with all daily activities, even if they only take a few moments, like when brushing your teeth. As in sitting, your ears should fall over your shoulders and your shoulders should fall over your hips. You should keep a slight tension in your abdominal muscles to brace your spine. Your tailbone should point down to the ground, not behind your body, resulting in an over-extended swayback posture.  INCORRECT STANDING POSTURES Common incorrect standing postures include a forward head, locked knees, and an excessive swayback. WALKING Walk with an upright posture. Your ears, shoulders and hips should all line-up. CORRECT LIFTING TECHNIQUES DO :   Assume a wide stance. This will provide you more stability and the opportunity to get as close as possible to the object which you are lifting.  Tense your abdominals to brace your spine. Bend at the knees and hips. Keeping your back locked in a neutral-spine position, lift using your leg muscles. Lift with your legs, keeping your back straight.  Test the weight of unknown objects before attempting to lift them.  Try to keep your elbows locked down at your sides in order get the best strength from your shoulders when carrying an object.  Always ask for help when lifting heavy or awkward objects. INCORRECT LIFTING TECHNIQUES DO NOT:   Lock your knees when lifting, even if it is a small object.  Bend and twist. Pivot at your feet or move your feet when needing to change directions.  Assume that you can safely pick up even a paperclip without proper posture. Document Released: 04/19/2005 Document Revised: 09/03/2013 Document Reviewed: 08/01/2008 [facility] Patient Information 2015 [place], [place]. This information is not intended to replace advice given to you by your health care provider. Make sure you discuss any questions you have with your health care provider.

## 2013-11-28 NOTE — Assessment & Plan Note (Addendum)
Rx diclofenac.Start tomorrow. Toradol given in office. 2 days work excuse. Will get xrays done. Placed order.

## 2013-11-30 ENCOUNTER — Telehealth: Payer: Self-pay | Admitting: Medical

## 2013-11-30 DIAGNOSIS — M4852XA Collapsed vertebra, not elsewhere classified, cervical region, initial encounter for fracture: Secondary | ICD-10-CM

## 2013-11-30 NOTE — Telephone Encounter (Signed)
I did talk with pt and notified of her compression fx. She confirmed she has not had  trauma. She does not have history of premature menopause. I advised will put in labs to get done over next week in our office. Also will schedule dexascan. I asked pt to schedule appointment with pcp or me in2  weeks. I discussed possilbe kyphoplasty  if pain worsens or persist. Also note no other pain regions reported.

## 2013-11-30 NOTE — Telephone Encounter (Signed)
Notifed pt of compression fx. Will get labs and get dexa scan next week. Pt confirms no trauma. No premature menopause. No pain in any other area. Follow up in 2 wks pcp or with myself. If pain persists consider kyphoplasty. Possible other imaging studies since young age fx without trauma.

## 2013-11-30 NOTE — Telephone Encounter (Signed)
Called pt but she was busy. Asked family member to get [facility] to call me back so can discuss xray results.

## 2013-12-01 ENCOUNTER — Telehealth: Payer: Self-pay | Admitting: Medical

## 2013-12-01 LAB — URINE CULTURE

## 2013-12-01 NOTE — Telephone Encounter (Signed)
Pt did have enterococcus on urine culture. Sensitive to macrobid. Will ask [facility] to notify pt and send in macrobid 100 mg #14 1 tab po bid x 7 days.

## 2013-12-03 ENCOUNTER — Other Ambulatory Visit (INDEPENDENT_AMBULATORY_CARE_PROVIDER_SITE_OTHER): Payer: 59

## 2013-12-03 DIAGNOSIS — M8448XA Pathological fracture, other site, initial encounter for fracture: Secondary | ICD-10-CM

## 2013-12-03 DIAGNOSIS — M4852XA Collapsed vertebra, not elsewhere classified, cervical region, initial encounter for fracture: Secondary | ICD-10-CM

## 2013-12-03 LAB — COMPREHENSIVE METABOLIC PANEL
ALBUMIN: 3.9 g/dL (ref 3.5–5.2)
ALK PHOS: 74 U/L (ref 39–117)
ALT: 40 U/L — AB (ref 0–35)
AST: 28 U/L (ref 0–37)
BUN: 20 mg/dL (ref 6–23)
CALCIUM: 8.6 mg/dL (ref 8.4–10.5)
CHLORIDE: 105 meq/L (ref 96–112)
CO2: 22 mEq/L (ref 19–32)
Creatinine, Ser: 0.9 mg/dL (ref 0.4–1.2)
GFR: 75.78 mL/min (ref >60.00)
Glucose, Bld: 71 mg/dL (ref 70–99)
Potassium: 3.1 mEq/L — ABNORMAL LOW (ref 3.5–5.1)
SODIUM: 136 meq/L (ref 135–145)
Total Bilirubin: 0.4 mg/dL (ref 0.2–1.2)
Total Protein: 6.2 g/dL (ref 6.0–8.3)

## 2013-12-03 LAB — VITAMIN D 25 HYDROXY (VIT D DEFICIENCY, FRACTURES): VITD: 31.93 ng/mL (ref 30.00–100.00)

## 2013-12-03 LAB — SEDIMENTATION RATE: Sed Rate: 7 mm/hr (ref 0–22)

## 2013-12-03 LAB — C-REACTIVE PROTEIN

## 2013-12-03 MED ORDER — NITROFURANTOIN MONOHYD MACRO 100 MG PO CAPS: 100.0000 mg | ORAL_CAPSULE | Freq: Two times a day (BID) | ORAL | Status: DC

## 2013-12-03 NOTE — Telephone Encounter (Signed)
Called pt and could not leave a voicemail due to inbox being full. Medication was sent to local pharmacy and mychart message sent to pt.

## 2013-12-03 NOTE — Addendum Note (Signed)
Addended by: Kris Hartmann on: 12/03/2013 09:37 AM   Modules accepted: Orders

## 2013-12-04 ENCOUNTER — Other Ambulatory Visit: Payer: Self-pay | Admitting: General Practice

## 2013-12-04 ENCOUNTER — Telehealth: Payer: Self-pay | Admitting: Family Medicine

## 2013-12-04 DIAGNOSIS — E876 Hypokalemia: Secondary | ICD-10-CM

## 2013-12-04 MED ORDER — POTASSIUM CHLORIDE CRYS ER 20 MEQ PO TBCR: 20.0000 meq | EXTENDED_RELEASE_TABLET | Freq: Every day | ORAL | Status: DC

## 2013-12-04 NOTE — Telephone Encounter (Signed)
Caller name: Alayla Relation to pt: Call back [place]:  Reason for call: pt would like lab results once they are in

## 2013-12-04 NOTE — Telephone Encounter (Signed)
Noted  KP 

## 2013-12-07 ENCOUNTER — Telehealth: Payer: Self-pay | Admitting: Family Medicine

## 2013-12-07 ENCOUNTER — Other Ambulatory Visit (HOSPITAL_COMMUNITY): Payer: Self-pay | Admitting: Orthopedic Surgery

## 2013-12-07 DIAGNOSIS — M549 Dorsalgia, unspecified: Secondary | ICD-10-CM

## 2013-12-07 DIAGNOSIS — S129XXA Fracture of neck, unspecified, initial encounter: Secondary | ICD-10-CM

## 2013-12-07 NOTE — Telephone Encounter (Signed)
Caller name: Milinda Cave  Call back number: [phone]   Reason for call:   Pt saw Percell Miller on Monday. Pt has questions regarding labs.  Pt also thought there was going to be a bone density scan order placed as well.  Please call pat.

## 2013-12-07 NOTE — Telephone Encounter (Signed)
Pt notified of labs - referral to call pt to schedule appointment.

## 2013-12-07 NOTE — Addendum Note (Signed)
Addended by: Peggyann Shoals on: 12/07/2013 03:38 PM   Modules accepted: Orders

## 2013-12-07 NOTE — Telephone Encounter (Signed)
DG-bone density ordered correctly.

## 2013-12-07 NOTE — Telephone Encounter (Signed)
Pt is requesting bone density rx, please fax to [facility] fax # [phone] Pt would like a phone call after script is faxed please call [phone] cell

## 2013-12-07 NOTE — Telephone Encounter (Signed)
Referral lady to call pt to schedule appointment.

## 2013-12-10 ENCOUNTER — Ambulatory Visit
Admission: RE | Admit: 2013-12-10 | Discharge: 2013-12-10 | Disposition: A | Discharge status: Home / Self Care | Payer: 59 | Source: Ambulatory Visit | Attending: Family Medicine | Admitting: Family Medicine

## 2013-12-10 ENCOUNTER — Other Ambulatory Visit: Payer: Self-pay | Admitting: Family Medicine

## 2013-12-10 ENCOUNTER — Telehealth: Payer: Self-pay

## 2013-12-10 ENCOUNTER — Ambulatory Visit (INDEPENDENT_AMBULATORY_CARE_PROVIDER_SITE_OTHER)
Admission: RE | Admit: 2013-12-10 | Discharge: 2013-12-10 | Disposition: A | Discharge status: Home / Self Care | Payer: 59 | Source: Ambulatory Visit | Attending: Family Medicine | Admitting: Family Medicine

## 2013-12-10 DIAGNOSIS — S32000A Wedge compression fracture of unspecified lumbar vertebra, initial encounter for closed fracture: Secondary | ICD-10-CM

## 2013-12-10 DIAGNOSIS — S129XXA Fracture of neck, unspecified, initial encounter: Secondary | ICD-10-CM

## 2013-12-10 DIAGNOSIS — M8448XA Pathological fracture, other site, initial encounter for fracture: Secondary | ICD-10-CM

## 2013-12-10 NOTE — Telephone Encounter (Signed)
Call from [facility] radiology and the woman stated the patient has a Lumbar fracture and wanted to know if Dr.Lowne would like to add the Vertebrae Assessment. Per Dr.Lowne she would like to have it done. A separate order has been placed.       KP

## 2013-12-11 ENCOUNTER — Ambulatory Visit (HOSPITAL_COMMUNITY)
Admission: RE | Admit: 2013-12-11 | Discharge: 2013-12-11 | Disposition: A | Discharge status: Home / Self Care | Payer: 59 | Source: Ambulatory Visit | Attending: Orthopedic Surgery | Admitting: Orthopedic Surgery

## 2013-12-11 DIAGNOSIS — M549 Dorsalgia, unspecified: Secondary | ICD-10-CM

## 2013-12-11 DIAGNOSIS — M8448XA Pathological fracture, other site, initial encounter for fracture: Secondary | ICD-10-CM | POA: Insufficient documentation

## 2013-12-11 DIAGNOSIS — M47817 Spondylosis without myelopathy or radiculopathy, lumbosacral region: Secondary | ICD-10-CM | POA: Insufficient documentation

## 2013-12-19 ENCOUNTER — Other Ambulatory Visit: Payer: Self-pay | Admitting: *Deleted

## 2013-12-19 ENCOUNTER — Ambulatory Visit: Payer: 59 | Attending: Orthopedic Surgery | Admitting: Rehabilitation

## 2013-12-19 ENCOUNTER — Telehealth: Payer: Self-pay | Admitting: *Deleted

## 2013-12-19 DIAGNOSIS — M545 Low back pain, unspecified: Secondary | ICD-10-CM | POA: Insufficient documentation

## 2013-12-19 DIAGNOSIS — IMO0001 Reserved for inherently not codable concepts without codable children: Secondary | ICD-10-CM | POA: Insufficient documentation

## 2013-12-19 DIAGNOSIS — M546 Pain in thoracic spine: Secondary | ICD-10-CM | POA: Insufficient documentation

## 2013-12-19 DIAGNOSIS — M8580 Other specified disorders of bone density and structure, unspecified site: Secondary | ICD-10-CM

## 2013-12-19 MED ORDER — ALENDRONATE SODIUM 70 MG PO TABS: 70.0000 mg | ORAL_TABLET | ORAL | Status: DC

## 2013-12-19 NOTE — Telephone Encounter (Signed)
Spoke with patient who states that her orthopedic doctor, Dr. Rolena Infante at [facility], would like for patient to have a metabolic bone work up done by an endocrinologist to determine cause of osteopenia, hence the referral.    Please advise.

## 2013-12-19 NOTE — Telephone Encounter (Signed)
???? 

## 2013-12-19 NOTE — Telephone Encounter (Signed)
Please advise      KP 

## 2013-12-19 NOTE — Telephone Encounter (Signed)
Pt requesting referral to endocrinology for her metabolic. Pt states that her ortho doctor sent paperwork.

## 2013-12-20 ENCOUNTER — Other Ambulatory Visit: Payer: Self-pay | Admitting: Family Medicine

## 2013-12-20 DIAGNOSIS — M8580 Other specified disorders of bone density and structure, unspecified site: Secondary | ICD-10-CM

## 2013-12-20 NOTE — Telephone Encounter (Signed)
Pt made aware that referral has been ordered.  Pt said "thank you."  No further needs or concerns voiced at this time.

## 2013-12-20 NOTE — Telephone Encounter (Signed)
Ok-- now I understand

## 2013-12-26 ENCOUNTER — Ambulatory Visit: Payer: 59 | Admitting: Rehabilitation

## 2014-01-02 ENCOUNTER — Ambulatory Visit: Payer: 59 | Admitting: Rehabilitation

## 2014-01-09 ENCOUNTER — Ambulatory Visit: Payer: 59 | Attending: Orthopedic Surgery | Admitting: Rehabilitation

## 2014-01-09 DIAGNOSIS — IMO0001 Reserved for inherently not codable concepts without codable children: Secondary | ICD-10-CM | POA: Insufficient documentation

## 2014-01-09 DIAGNOSIS — M545 Low back pain, unspecified: Secondary | ICD-10-CM | POA: Insufficient documentation

## 2014-01-09 DIAGNOSIS — M546 Pain in thoracic spine: Secondary | ICD-10-CM | POA: Insufficient documentation

## 2014-01-10 ENCOUNTER — Encounter: Payer: Self-pay | Admitting: Internal Medicine

## 2014-01-10 ENCOUNTER — Ambulatory Visit (INDEPENDENT_AMBULATORY_CARE_PROVIDER_SITE_OTHER): Payer: 59 | Admitting: Internal Medicine

## 2014-01-10 VITALS — BP 112/68 | HR 95 | Temp 98.3°F | Resp 12 | Ht 63.5 in | Wt 171.0 lb

## 2014-01-10 DIAGNOSIS — R946 Abnormal results of thyroid function studies: Secondary | ICD-10-CM

## 2014-01-10 DIAGNOSIS — R7989 Other specified abnormal findings of blood chemistry: Secondary | ICD-10-CM

## 2014-01-10 DIAGNOSIS — M8448XD Pathological fracture, other site, subsequent encounter for fracture with routine healing: Secondary | ICD-10-CM

## 2014-01-10 DIAGNOSIS — M8080XD Other osteoporosis with current pathological fracture, unspecified site, subsequent encounter for fracture with routine healing: Secondary | ICD-10-CM

## 2014-01-10 DIAGNOSIS — M810 Age-related osteoporosis without current pathological fracture: Secondary | ICD-10-CM | POA: Insufficient documentation

## 2014-01-10 LAB — PHOSPHORUS: Phosphorus: 2.6 mg/dL (ref 2.3–4.6)

## 2014-01-10 LAB — TSH: TSH: 0.2 u[IU]/mL — ABNORMAL LOW (ref 0.35–4.50)

## 2014-01-10 LAB — T3, FREE: T3, Free: 2.9 pg/mL (ref 2.3–4.2)

## 2014-01-10 LAB — T4, FREE: Free T4: 0.8 ng/dL (ref 0.60–1.60)

## 2014-01-10 NOTE — Progress Notes (Signed)
Patient ID: [name], female   DOB: Dec 05, 1970, 43 y.o.   MRN: [id number]   HPI  Brooke [name] is a 43 y.o.-year-old female, referred by her PCP, Dr. Etter [name], in consultation for osteoporosis.  She had a compression fx of L1 6 weeks ago >> midback pain >> Xray and MRI , then DEXA >> osteoporosis  Pt was dx with OP in 12/2013. She denies any falls. Has some dizziness, but not falls.  I reviewed pt's DEXA scans: Date L1-L4 (L3) Z score FN Z-score  12/10/2013 Not calc, but b/w -2.5 and -3.0 RFN: -1.1 LFN: -1.2   She started Fosamax 2 weeks ago. She tolerates this well.  No h/o hyper/hypocalcemia. No h/o hyperparathyroidism. No h/o kidney stones. Lab Results  Component Value Date   CALCIUM 8.6 12/03/2013   CALCIUM 8.6 03/05/2013   CALCIUM 8.9 09/30/2010   CALCIUM 9.2 09/05/2009   CALCIUM 8.9 05/31/2008   CALCIUM 8.6 07/14/2006   No h/o thyrotoxicosis. She does have FH of hypothyroidism in Mother, aunt. Reviewed TSH recent levels:  Lab Results  Component Value Date   TSH 1.15 09/30/2010   TSH 1.52 09/05/2009   TSH 1.17 05/31/2008   TSH 1.55 07/14/2006   No h/o vitamin D deficiency. Last vit D level was 32 in 12/2013. Started Ca 500 mg 2x a day and vitamin D 1000 units every other day.  No h/o CKD. Last BUN/Cr: Lab Results  Component Value Date   BUN 20 12/03/2013   CREATININE 0.9 12/03/2013   She also eats dairy every a day.   She does not take high vitamin A doses.  No weight bearing exercises - has back pain now.   Pt does have a FH of osteopenia in mother and aunt. No FH of fractures.  She has a history of depo-Provera x 18 years >> gained 40 lbs. Recently stopped - last dose in 07/2013.  She started to have menses in 12/2013. Husband had vasectomy.   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 No long term steroid use.  ROS: Constitutional: + weight gain, + fatigue, no subjective hyperthermia/hypothermia, + poor sleep, + nocturia Eyes: no blurry vision,  no xerophthalmia ENT: no sore throat, no nodules palpated in throat, no dysphagia/odynophagia, no hoarseness, + tinnitus Cardiovascular: no CP/SOB/palpitations/leg swelling Respiratory: no cough/SOB Gastrointestinal: no N/V/D/+ C Musculoskeletal: + both: muscle/joint aches Skin: no rashes, + easy bruising Neurological: no tremors/numbness/tingling/dizziness Psychiatric: + both: depression/anxiety + low libido  Past Medical History  Diagnosis Date  . Depression   . Incontinence   . Hyperglycemia 03/05/2013   Past Surgical History  Procedure Laterality Date  . Rhinoplasty    . Tonsillectomy    . Sinus endo w/fusion     History   Social History  . Marital Status: Married    Spouse Name: N/A    Number of Children: 3  . Years of Education: 16   Occupational History  . RN/OR [employer]   Social History Main Topics  . Smoking status: Never Smoker   . Smokeless tobacco: Never Used  . Alcohol Use: No  . Drug Use: No  . Sexual Activity: Yes    Partners: Male   Current Outpatient Prescriptions on File Prior to Visit  Medication Sig Dispense Refill  . alendronate (FOSAMAX) 70 MG tablet Take 1 tablet (70 mg total) by mouth every 7 (seven) days. Take with a full glass of water on an empty stomach.  12 tablet  3  . ALPRAZolam (XANAX) 0.5 MG tablet Take by mouth at bedtime as needed. Take 2 tab at bedtime       . ARIPiprazole (ABILIFY) 5 MG tablet Take 2.5 mg by mouth daily.        . diclofenac (VOLTAREN) 75 MG EC tablet Take 1 tablet (75 mg total) by mouth 2 (two) times daily.  30 tablet  0  . PARoxetine (PAXIL) 20 MG tablet Take 80 mg by mouth every morning.        . topiramate (TOPAMAX) 100 MG tablet Take 100 mg by mouth daily.        . traZODone (DESYREL) 25 mg TABS Take 25-50 mg by mouth at bedtime.      Marland Kitchen zolpidem (AMBIEN) 5 MG tablet Take 5 mg by mouth at bedtime as needed for sleep.      . nitrofurantoin, macrocrystal-monohydrate, (MACROBID) 100 MG capsule Take 1  capsule (100 mg total) by mouth 2 (two) times daily.  14 capsule  0  . potassium chloride SA (K-DUR,KLOR-CON) 20 MEQ tablet Take 1 tablet (20 mEq total) by mouth daily.  14 tablet  0  . predniSONE (DELTASONE) 10 MG tablet 3 tabs x 3 days, 2 tabs x 3 days, 1 tab x 3 days  18 tablet  0  . Triamcinolone Acetonide 0.025 % LOTN Apply twice a day as needed  60 mL  0   No current facility-administered medications on file prior to visit.   No Known Allergies Family History  Problem Relation Age of Onset  . Thyroid disease Mother   . Diabetes Father   . Hypertension Father   . Hypertension Paternal Uncle   . Diabetes Paternal Grandmother   . Diabetes Paternal Aunt    PE: BP 112/68  Pulse 95  Temp(Src) 98.3 F (36.8 C) (Oral)  Resp 12  Ht 5' 3.5" (1.613 m)  Wt 171 lb (77.565 kg)  BMI 29.81 kg/m2  SpO2 96% Wt Readings from Last 3 Encounters:  01/10/14 171 lb (77.565 kg)  11/28/13 169 lb (76.658 kg)  09/07/13 167 lb (75.751 kg)   Constitutional: overweight, in NAD. No kyphosis. Eyes: PERRLA, EOMI, no exophthalmos ENT: moist mucous membranes, no thyromegaly, no cervical lymphadenopathy Cardiovascular: RRR, No MRG Respiratory: CTA B Gastrointestinal: abdomen soft, NT, ND, BS+ Musculoskeletal: no deformities, strength intact in all 4 Skin: moist, warm, no rashes Neurological: no tremor with outstretched hands, DTR normal in all 4  Assessment: 1. Premenopausal osteoporosis  Plan: 1. Osteoporosis - likely 2/2 long-term Provera use - Discussed about increased risk of fracture, depending on the Z score, greatly increased when the Z score is lower than -2.0 (but this is actually a continuum and -2.0 should not be regarded as an absolute threshold) - We reviewed her DEXA scans together, and I explained that based on the Z scores and her previous h/o vb fx, she has an increased risk for fractures, especially of the vertebrae. The studies have shown an increased risk of fx of adjacent vb if  she were to undergo a vertebro- or kyphoplasty (she was wondering why this was not done). She also has less pain (more like a pressure now) at the site of her previous fx and this will get better. She is now out of work until 01/20/2014. - We discussed about the different medication classes and I explained the mechanism of action and expected benefits. I agree to continue the Fosamax for now. Stopping the Provera will help a lot in  increasing her bone formation and the Fosamax will help decreasing the bone turnover. - we reviewed her dietary and supplemental calcium and vitamin D intake. I advised her to increase vitamin D to 1000 units daily and get 1000 mg Ca in diet and if not getting this, to supplement with calcium citrate  - given her specific instructions about food sources for these - see pt instructions  - given handout from [place] Re: weight bearing exercises - advised to do this every day or at least 5/7 days - We will check the following tests: Orders Placed This Encounter  Procedures  . TSH  . T3, free  . T4, free  . Calcium, Urine, 24 Hour  . Creatinine, Urine, 24 Hour  . Phosphorus  - discussed correct urine collection protocol - will check a new DEXA scan in 1 year and if decreases further, I will start a more comprehensive bone metabolism investigation - will see pt back in a year  Office Visit on 01/10/2014  Component Date Value Ref Range Status  . TSH 01/10/2014 0.20* 0.35 - 4.50 uIU/mL Final  . T3, Free 01/10/2014 2.9  2.3 - 4.2 pg/mL Final  . Free T4 01/10/2014 0.80  0.60 - 1.60 ng/dL Final  . Phosphorus 01/10/2014 2.6  2.3 - 4.6 mg/dL Final   TSH a little low >> will need a repeat in 2 months (will order). Will await the Urine calcium level.  Marland Kitchen09/23/2015 UCa still pending. I will addend the results when they become available.

## 2014-01-10 NOTE — Patient Instructions (Signed)
Please stop at the lab. Please collect the 24h urine for calcium level over the weekend. Patient information (Up-to-Date): Collection of a 24-hour urine specimen  - You should collect every drop of urine during each 24-hour period. It does not matter how much or little urine is passed each time, as long as every drop is collected. - Begin the urine collection in the morning after you wake up, after you have emptied your bladder for the first time. - Urinate (empty the bladder) for the first time and flush it down the toilet. Note the exact time (eg, 6:15 AM). You will begin the urine collection at this time. - Collect every drop of urine during the day and night in an empty collection bottle. Store the bottle at room temperature or in the refrigerator. - If you need to have a bowel movement, any urine passed with the bowel movement should be collected. Try not to include feces with the urine collection. If feces does get mixed in, do not try to remove the feces from the urine collection bottle. - Finish by collecting the first urine passed the next morning, adding it to the collection bottle. This should be within ten minutes before or after the time of the first morning void on the first day (which was flushed). In this example, you would try to void between 6:05 and 6:25 on the second day. - If you need to urinate one hour before the final collection time, drink a full glass of water so that you can void again at the appropriate time. If you have to urinate 20 minutes before, try to hold the urine until the proper time. - Please note the exact time of the final collection, even if it is not the same time as when collection began on day 1. - The bottle(s) may be kept at room temperature for a day or two, but should be kept cool or refrigerated for longer periods of time. Please call me (or send me a msg through [place]) around 12/2014 to order a DEXA scan. I will then see you back for a visit in  01/2015.  Please make sure you get at least 1000 mg/day calcium in your diet. If not, please supplement with calcium citrate. Please increase the vitamin D dose to 1000 units daily.  Dietary sources of calcium and vitamin D:  Calcium content (mg) - http://www.niams.MoviePins.co.za  Fortified oatmeal, 1 packet 350  Sardines, canned in oil, with edible bones, 3 oz. 324  Cheddar cheese, 1 oz. shredded 306  Milk, nonfat, 1 cup 302  Milkshake, 1 cup 300  Yogurt, plain, low-fat, 1 cup 300  Soybeans, cooked, 1 cup 261  Tofu, firm, with calcium,  cup 204  Orange juice, fortified with calcium, 6 oz. 200-260 (varies)  Salmon, canned, with edible bones, 3 oz. 181  Pudding, instant, made with 2% milk,  cup 153  Baked beans, 1 cup [place], 1% milk fat, 1 cup 138  Spaghetti, lasagna, 1 cup 125  Frozen yogurt, vanilla, soft-serve,  cup 103  Ready-to-eat cereal, fortified with calcium, 1 cup 100-1,000 (varies)  Cheese pizza, 1 slice 924  Fortified waffles, 2 100  Turnip greens, boiled,  cup 99  Broccoli, raw, 1 cup 90  Ice cream, vanilla,  cup 85  Soy or rice milk, fortified with calcium, 1 cup 80-500 (varies)   Vitamin D content (International Units, IU) - https://www.ars.usda.gov Cod liver oil, 1 tablespoon 1,360  Swordfish, cooked, 3 oz 566  Sal[place] [facility]), cooked, 3 oz 447  Tuna fish, canned in water, drained, 3 oz 154  Orange juice fortified with vitamin D, 1 cup (check product labels, as amount of added vitamin D varies) 137  Milk, nonfat, reduced fat, and whole, vitamin D-fortified, 1 cup 115-124  Yogurt, fortified with 20% of the daily value for vitamin D, 6 oz 80  Margarine, fortified, 1 tablespoon 60  Sardines, canned in oil, drained, 2 sardines 46  Liver, beef, cooked, 3 oz 42  Egg, 1 large (vitamin D is found in yolk) 41  Ready-to-eat cereal, fortified with 10% of the daily value for vitamin D, 0.75-1 cup  40  Cheese, Swiss, 1 oz 6    Please start a program of weight bearing exercises: Exercise for Strong Bones (from [place]) There are two types of exercises that are important for building and maintaining bone density:  weight-bearing and muscle-strengthening exercises. Weight-bearing Exercises These exercises include activities that make you move against gravity while staying upright. Weight-bearing exercises can be high-impact or low-impact. High-impact weight-bearing exercises help build bones and keep them strong. If you have broken a bone due to osteoporosis or are at risk of breaking a bone, you may need to avoid high-impact exercises. If you're not sure, you should check with your healthcare provider. Examples of high-impact weight-bearing exercises are:   Dancing   Doing high-impact aerobics   Hiking   Jogging/running   Jumping Rope   Stair climbing   Tennis Low-impact weight-bearing exercises can also help keep bones strong and are a safe alternative if you cannot do high-impact exercises. Examples of low-impact weight-bearing exercises are:   Using elliptical training machines   Doing low-impact aerobics   Using stair-step machines   Fast walking on a treadmill or outside Muscle-Strengthening Exercises These exercises include activities where you move your body, a weight or some other resistance against gravity. They are also known as resistance exercises and include:   Lifting weights   Using elastic exercise bands   Using weight machines   Lifting your own body weight   Functional movements, such as standing and rising up on your toes Yoga and Pilates can also improve strength, balance and flexibility. However, certain positions may not be safe for people with osteoporosis or those at increased risk of broken bones. For example, exercises that have you bend forward may increase the chance of breaking a bone in the spine. A physical therapist should be able to help you learn which  exercises are safe and appropriate for you. Non-Impact Exercises Non-impact exercises can help you to improve balance, posture and how well you move in everyday activities. These exercises can also help to increase muscle strength and decrease the risk of falls and broken bones. Some of these exercises include:   Balance exercises that strengthen your legs and test your balance, such as Tai Chi, can decrease your risk of falls.   Posture exercises that improve your posture and reduce rounded or "sloping" shoulders can help you decrease the chance of breaking a bone, especially in the spine.   Functional exercises that improve how well you move can help you with everyday activities and decrease your chance of falling and breaking a bone. For example, if you have trouble getting up from a chair or climbing stairs, you should do these activities as exercises. A physical therapist can teach you balance, posture and functional exercises. Starting a New Exercise Program If you haven't exercised regularly for a while,  check with your healthcare provider before beginning a new exercise program-particularly if you have health problems such as heart disease, diabetes or high blood pressure. If you're at high risk of breaking a bone, you should work with a physical therapist to develop a safe exercise program. Once you have your healthcare provider's approval, start slowly. If you've already broken bones in the spine because of osteoporosis, be very careful to avoid activities that require reaching down, bending forward, rapid twisting motions, heavy lifting and those that increase your chance of a fall. As you get started, your muscles may feel sore for a day or two after you exercise. If soreness lasts longer, you may be working too hard and need to ease up. Exercises should be done in a pain-free range of motion. How Much Exercise Do You Need? Weight-bearing exercises 30 minutes on most days of the week. Do a  30-minutesession or multiple sessions spread out throughout the day. The benefits to your bones are the same.   Muscle-strengthening exercises Two to three days per week. If you don't have much time for strengthening/resistance training, do small amounts at a time. You can do just one body part each day. For example do arms one day, legs the next and trunk the next. You can also spread these exercises out during your normal day.  Balance, posture and functional exercises Every day or as often as needed. You may want to focus on one area more than the others. If you have fallen or lose your balance, spend time doing balance exercises. If you are getting rounded shoulders, work more on posture exercises. If you have trouble climbing stairs or getting up from the couch, do more functional exercises. You can also perform these exercises at one time or spread them during your day. Work with a phyiscal therapist to learn the right exercises for you.

## 2014-01-16 ENCOUNTER — Ambulatory Visit: Payer: 59 | Admitting: Rehabilitation

## 2014-02-05 ENCOUNTER — Other Ambulatory Visit: Payer: 59

## 2014-02-05 ENCOUNTER — Ambulatory Visit (INDEPENDENT_AMBULATORY_CARE_PROVIDER_SITE_OTHER): Payer: 59

## 2014-02-05 ENCOUNTER — Telehealth: Payer: Self-pay | Admitting: Internal Medicine

## 2014-02-05 DIAGNOSIS — Z23 Encounter for immunization: Secondary | ICD-10-CM

## 2014-02-05 DIAGNOSIS — E876 Hypokalemia: Secondary | ICD-10-CM

## 2014-02-05 LAB — COMPREHENSIVE METABOLIC PANEL
ALBUMIN: 3.9 g/dL (ref 3.5–5.2)
ALK PHOS: 71 U/L (ref 39–117)
ALT: 19 U/L (ref 0–35)
AST: 19 U/L (ref 0–37)
BUN: 12 mg/dL (ref 6–23)
CALCIUM: 8.8 mg/dL (ref 8.4–10.5)
CHLORIDE: 106 meq/L (ref 96–112)
CO2: 22 mEq/L (ref 19–32)
Creatinine, Ser: 0.8 mg/dL (ref 0.4–1.2)
GFR: 81.14 mL/min (ref >60.00)
Glucose, Bld: 91 mg/dL (ref 70–99)
POTASSIUM: 3.7 meq/L (ref 3.5–5.1)
SODIUM: 137 meq/L (ref 135–145)
TOTAL PROTEIN: 6.5 g/dL (ref 6.0–8.3)
Total Bilirubin: 0.4 mg/dL (ref 0.2–1.2)

## 2014-02-05 NOTE — Telephone Encounter (Signed)
I assume she refers to the ones on 01/10/2014? They were sent then.  Entered by Philemon Kingdom, MD at 01/10/2014 6:02 PM Dear Ms Brooke [name],  The phosphorus is normal. The thyroid test (TSH) is a little low, while the rest of the thyroid labs are normal. We have been getting this result a lot with the TSH assay that we are using... Nothing to do for now, but we need a repeat of the thyroid labs in ~2 months. You can have them here in my office or at your PCP's office. If you decide to have them checked here, please call our main office number [phone]) to schedule a lab appointment.  Sincerely,  Philemon Kingdom MD

## 2014-02-05 NOTE — Addendum Note (Signed)
Addended by: Modena Morrow D on: 02/05/2014 11:57 AM   Modules accepted: Orders

## 2014-02-05 NOTE — Telephone Encounter (Signed)
Patient would like to know the results of her labs.

## 2014-02-05 NOTE — Telephone Encounter (Signed)
Called pt and advised her per Dr Arman Filter note with her lab results. Pt understood. Pt scheduled a repeat lab appt in 2 mos.

## 2014-02-05 NOTE — Telephone Encounter (Signed)
Please read note below and advise.  

## 2014-02-06 ENCOUNTER — Other Ambulatory Visit: Payer: 59

## 2014-02-06 ENCOUNTER — Ambulatory Visit (INDEPENDENT_AMBULATORY_CARE_PROVIDER_SITE_OTHER): Payer: 59 | Admitting: Internal Medicine

## 2014-02-06 ENCOUNTER — Ambulatory Visit: Payer: 59 | Admitting: Internal Medicine

## 2014-02-06 ENCOUNTER — Encounter: Payer: Self-pay | Admitting: Internal Medicine

## 2014-02-06 VITALS — BP 100/72 | HR 90 | Temp 98.1°F | Resp 12 | Wt 170.4 lb

## 2014-02-06 DIAGNOSIS — N390 Urinary tract infection, site not specified: Secondary | ICD-10-CM

## 2014-02-06 DIAGNOSIS — R350 Frequency of micturition: Secondary | ICD-10-CM

## 2014-02-06 DIAGNOSIS — R300 Dysuria: Secondary | ICD-10-CM

## 2014-02-06 DIAGNOSIS — R319 Hematuria, unspecified: Secondary | ICD-10-CM

## 2014-02-06 DIAGNOSIS — R8290 Unspecified abnormal findings in urine: Secondary | ICD-10-CM

## 2014-02-06 LAB — POCT URINALYSIS DIPSTICK
BILIRUBIN UA: NEGATIVE
GLUCOSE UA: NEGATIVE
Ketones, UA: NEGATIVE
Nitrite, UA: NEGATIVE
Spec Grav, UA: <=1.005
Urobilinogen, UA: 0.2
pH, UA: 6.5

## 2014-02-06 MED ORDER — NITROFURANTOIN MONOHYD MACRO 100 MG PO CAPS: 100.0000 mg | ORAL_CAPSULE | Freq: Two times a day (BID) | ORAL | Status: DC

## 2014-02-06 MED ORDER — PHENAZOPYRIDINE HCL 200 MG PO TABS: 200.0000 mg | ORAL_TABLET | Freq: Three times a day (TID) | ORAL | Status: DC | PRN

## 2014-02-06 NOTE — Progress Notes (Signed)
Pre visit review using our clinic review tool, if applicable. No additional management support is needed unless otherwise documented below in the visit note. 

## 2014-02-06 NOTE — Patient Instructions (Signed)
Drink as much nondairy fluids as possible. Avoid spicy foods or alcohol as  these may aggravate the bladder. Do not take decongestants. Avoid narcotics if possible. 

## 2014-02-06 NOTE — Progress Notes (Signed)
   Subjective:    Patient ID: Brooke [name], female    DOB: Apr 25, 1971, 43 y.o.   MRN: [id number]  HPI   Her symptoms began today as some low back discomfort associated with "bladder spasms" manifested as dysuria. She describes urinary urgency and frequency. She noted hematuria and also nocturia 2-3 times per night  She does have a history of compression fracture of the lumbar spine so she has some chronic back problems   There is no history of genitourinary anomaly, renal calculi, or instrumentation.  On 08/05/13 she had an Escherichia coli urinary tract infection which was uniformly sensitive to all antibiotics. On 11/28/13 she exhibited enterococcal species sensitive to ampicillin, Levaquin, nitrofurantoin, and vancomycin.     Review of Systems  She has no hesitancy or pyuria  She denies fever, chills, or sweats       Objective:   Physical Exam  Positive or pertinent findings include: She has some left flank discomfort to light percussion She has slight tenderness in suprapubic area.   General appearance :adequately nourished; in no distress.  Eyes: No conjunctival inflammation or scleral icterus is present.  Oral exam: Dental hygiene is good. Lips and gums are healthy appearing.There is no oropharyngeal erythema or exudate noted.   Heart:  Normal rate and regular rhythm. S1 and S2 normal without gallop, murmur, click, rub or other extra sounds     Lungs:Chest clear to auscultation; no wheezes, rhonchi,rales ,or rubs present.No increased work of breathing.   Abdomen: bowel sounds normal, soft  without masses, organomegaly or hernias noted.  No guarding or rebound.   Vascular : all pulses equal ; no bruits present.  Skin:Warm & dry.  Intact without suspicious lesions or rashes ; no jaundice or tenting  Lymphatic: No lymphadenopathy is noted about the head, neck, axilla            Assessment & Plan:  #1 possible recurrent urinary tract infections  Plan:  See orders and recommendations. If this keep recurring urology evaluation is mandatory to rule out some anatomical predisposition to recurrent infections

## 2014-02-07 ENCOUNTER — Telehealth: Payer: Self-pay | Admitting: Medical

## 2014-02-07 ENCOUNTER — Ambulatory Visit: Payer: 59 | Admitting: Medical

## 2014-02-08 LAB — URINE CULTURE: Colony Count: >=100000

## 2014-02-11 NOTE — Telephone Encounter (Signed)
Medication refill sent to [name]. Please make patient aware.

## 2014-02-11 NOTE — Telephone Encounter (Signed)
Patient notified of Potassium refill.

## 2014-02-11 NOTE — Telephone Encounter (Signed)
Message copied by Raiford Noble on Mon Feb 11, 2014  2:25 PM ------      Message from: Vernie Shanks E      Created: Wed Feb 06, 2014  9:02 AM       Called patient with lab results.  States you gave her Rx for Potassium suppliment 2 weeks ago but she has run out and needs refill. Advised to keep taking Potassium. ------

## 2014-02-12 ENCOUNTER — Telehealth: Payer: Self-pay

## 2014-02-12 MED ORDER — CIPROFLOXACIN HCL 500 MG PO TABS: 500.0000 mg | ORAL_TABLET | Freq: Two times a day (BID) | ORAL | Status: DC

## 2014-02-12 NOTE — Telephone Encounter (Signed)
Discussed with patient and she voiced understanding. Rx has been sent to the [facility] because the [place] the [place]. She has agreed to stop the Macrodantin.    KP

## 2014-02-12 NOTE — Telephone Encounter (Signed)
Message copied by Ewing Schlein on Tue Feb 12, 2014  3:49 PM ------      Message from: Penni Homans A      Created: Mon Feb 11, 2014  7:55 AM       Notify UTI needs Ciprofloxacin 500 mg po bid x 5 days. ------

## 2014-02-18 ENCOUNTER — Encounter: Payer: 59 | Admitting: Family Medicine

## 2014-02-19 ENCOUNTER — Other Ambulatory Visit: Payer: Self-pay

## 2014-02-19 ENCOUNTER — Other Ambulatory Visit (HOSPITAL_COMMUNITY)
Admission: RE | Admit: 2014-02-19 | Discharge: 2014-02-19 | Disposition: A | Discharge status: Home / Self Care | Payer: 59 | Source: Ambulatory Visit | Attending: Family Medicine | Admitting: Family Medicine

## 2014-02-19 ENCOUNTER — Encounter: Payer: Self-pay | Admitting: Medical

## 2014-02-19 ENCOUNTER — Ambulatory Visit (INDEPENDENT_AMBULATORY_CARE_PROVIDER_SITE_OTHER): Payer: 59 | Admitting: Medical

## 2014-02-19 VITALS — BP 103/78 | HR 71 | Temp 98.9°F | Ht 63.3 in | Wt 170.0 lb

## 2014-02-19 DIAGNOSIS — Z1239 Encounter for other screening for malignant neoplasm of breast: Secondary | ICD-10-CM

## 2014-02-19 DIAGNOSIS — Z01419 Encounter for gynecological examination (general) (routine) without abnormal findings: Secondary | ICD-10-CM | POA: Diagnosis not present

## 2014-02-19 DIAGNOSIS — Z0189 Encounter for other specified special examinations: Secondary | ICD-10-CM

## 2014-02-19 DIAGNOSIS — Z0000 Encounter for general adult medical examination without abnormal findings: Secondary | ICD-10-CM | POA: Insufficient documentation

## 2014-02-19 LAB — LIPID PANEL
CHOLESTEROL: 185 mg/dL (ref 0–200)
HDL: 45.8 mg/dL (ref >39.00)
LDL Cholesterol: 119 mg/dL — ABNORMAL HIGH (ref 0–99)
NonHDL: 139.2
Total CHOL/HDL Ratio: 4
Triglycerides: 100 mg/dL (ref 0.0–149.0)
VLDL: 20 mg/dL (ref 0.0–40.0)

## 2014-02-19 LAB — COMPREHENSIVE METABOLIC PANEL
ALBUMIN: 3.8 g/dL (ref 3.5–5.2)
ALT: 22 U/L (ref 0–35)
AST: 22 U/L (ref 0–37)
Alkaline Phosphatase: 71 U/L (ref 39–117)
BUN: 18 mg/dL (ref 6–23)
CO2: 21 meq/L (ref 19–32)
Calcium: 9 mg/dL (ref 8.4–10.5)
Chloride: 109 mEq/L (ref 96–112)
Creatinine, Ser: 1 mg/dL (ref 0.4–1.2)
GFR: 66.69 mL/min (ref >60.00)
GLUCOSE: 91 mg/dL (ref 70–99)
POTASSIUM: 3.5 meq/L (ref 3.5–5.1)
Sodium: 138 mEq/L (ref 135–145)
Total Bilirubin: 0.5 mg/dL (ref 0.2–1.2)
Total Protein: 7.1 g/dL (ref 6.0–8.3)

## 2014-02-19 LAB — CBC WITH DIFFERENTIAL/PLATELET
Basophils Absolute: 0 10*3/uL (ref 0.0–0.1)
Basophils Relative: 0.2 % (ref 0.0–3.0)
EOS PCT: 3.1 % (ref 0.0–5.0)
Eosinophils Absolute: 0.2 10*3/uL (ref 0.0–0.7)
HCT: 43.9 % (ref 36.0–46.0)
Hemoglobin: 14.3 g/dL (ref 12.0–15.0)
LYMPHS PCT: 25.3 % (ref 12.0–46.0)
Lymphs Abs: 1.4 10*3/uL (ref 0.7–4.0)
MCHC: 32.5 g/dL (ref 30.0–36.0)
MCV: 93.8 fl (ref 78.0–100.0)
MONOS PCT: 6.9 % (ref 3.0–12.0)
Monocytes Absolute: 0.4 10*3/uL (ref 0.1–1.0)
NEUTROS PCT: 64.5 % (ref 43.0–77.0)
Neutro Abs: 3.6 10*3/uL (ref 1.4–7.7)
PLATELETS: 203 10*3/uL (ref 150.0–400.0)
RBC: 4.68 Mil/uL (ref 3.87–5.11)
RDW: 13.2 % (ref 11.5–15.5)
WBC: 5.6 10*3/uL (ref 4.0–10.5)

## 2014-02-19 MED ORDER — VERAPAMIL HCL 120 MG PO TABS: 120.0000 mg | ORAL_TABLET | Freq: Three times a day (TID) | ORAL | Status: DC

## 2014-02-19 NOTE — Patient Instructions (Addendum)
Preventive Care for Adults A healthy lifestyle and preventive care can promote health and wellness. Preventive health guidelines for women include the following key practices.  A routine yearly physical is a good way to check with your health care provider about your health and preventive screening. It is a chance to share any concerns and updates on your health and to receive a thorough exam.  Visit your dentist for a routine exam and preventive care every 6 months. Brush your teeth twice a day and floss once a day. Good oral hygiene prevents tooth decay and gum disease.  The frequency of eye exams is based on your age, health, family medical history, use of contact lenses, and other factors. Follow your health care provider's recommendations for frequency of eye exams.  Eat a healthy diet. Foods like vegetables, fruits, whole grains, low-fat dairy products, and lean protein foods contain the nutrients you need without too many calories. Decrease your intake of foods high in solid fats, added sugars, and salt. Eat the right amount of calories for you.Get information about a proper diet from your health care provider, if necessary.  Regular physical exercise is one of the most important things you can do for your health. Most adults should get at least 150 minutes of moderate-intensity exercise (any activity that increases your heart rate and causes you to sweat) each week. In addition, most adults need muscle-strengthening exercises on 2 or more days a week.  Maintain a healthy weight. The body mass index (BMI) is a screening tool to identify possible weight problems. It provides an estimate of body fat based on height and weight. Your health care provider can find your BMI and can help you achieve or maintain a healthy weight.For adults 20 years and older:  A BMI below 18.5 is considered underweight.  A BMI of 18.5 to 24.9 is normal.  A BMI of 25 to 29.9 is considered overweight.  A BMI of  30 and above is considered obese.  Maintain normal blood lipids and cholesterol levels by exercising and minimizing your intake of saturated fat. Eat a balanced diet with plenty of fruit and vegetables. Blood tests for lipids and cholesterol should begin at age 76 and be repeated every 5 years. If your lipid or cholesterol levels are high, you are over 50, or you are at high risk for heart disease, you may need your cholesterol levels checked more frequently.Ongoing high lipid and cholesterol levels should be treated with medicines if diet and exercise are not working.  If you smoke, find out from your health care provider how to quit. If you do not use tobacco, do not start.  Lung cancer screening is recommended for adults aged 22-80 years who are at high risk for developing lung cancer because of a history of smoking. A yearly low-dose CT scan of the lungs is recommended for people who have at least a 30-pack-year history of smoking and are a current smoker or have quit within the past 15 years. A pack year of smoking is smoking an average of 1 pack of cigarettes a day for 1 year (for example: 1 pack a day for 30 years or 2 packs a day for 15 years). Yearly screening should continue until the smoker has stopped smoking for at least 15 years. Yearly screening should be stopped for people who develop a health problem that would prevent them from having lung cancer treatment.  If you are pregnant, do not drink alcohol. If you are breastfeeding,  be very cautious about drinking alcohol. If you are not pregnant and choose to drink alcohol, do not have more than 1 drink per day. One drink is considered to be 12 ounces (355 mL) of beer, 5 ounces (148 mL) of wine, or 1.5 ounces (44 mL) of liquor.  Avoid use of street drugs. Do not share needles with anyone. Ask for help if you need support or instructions about stopping the use of drugs.  High blood pressure causes heart disease and increases the risk of  stroke. Your blood pressure should be checked at least every 1 to 2 years. Ongoing high blood pressure should be treated with medicines if weight loss and exercise do not work.  If you are 75-52 years old, ask your health care provider if you should take aspirin to prevent strokes.  Diabetes screening involves taking a blood sample to check your fasting blood sugar level. This should be done once every 3 years, after age 15, if you are within normal weight and without risk factors for diabetes. Testing should be considered at a younger age or be carried out more frequently if you are overweight and have at least 1 risk factor for diabetes.  Breast cancer screening is essential preventive care for women. You should practice "breast self-awareness." This means understanding the normal appearance and feel of your breasts and may include breast self-examination. Any changes detected, no matter how small, should be reported to a health care provider. Women in their 58s and 30s should have a clinical breast exam (CBE) by a health care provider as part of a regular health exam every 1 to 3 years. After age 16, women should have a CBE every year. Starting at age 53, women should consider having a mammogram (breast X-ray test) every year. Women who have a family history of breast cancer should talk to their health care provider about genetic screening. Women at a high risk of breast cancer should talk to their health care providers about having an MRI and a mammogram every year.  Breast cancer gene (BRCA)-related cancer risk assessment is recommended for women who have family members with BRCA-related cancers. BRCA-related cancers include breast, ovarian, tubal, and peritoneal cancers. Having family members with these cancers may be associated with an increased risk for harmful changes (mutations) in the breast cancer genes BRCA1 and BRCA2. Results of the assessment will determine the need for genetic counseling and  BRCA1 and BRCA2 testing.  Routine pelvic exams to screen for cancer are no longer recommended for nonpregnant women who are considered low risk for cancer of the pelvic organs (ovaries, uterus, and vagina) and who do not have symptoms. Ask your health care provider if a screening pelvic exam is right for you.  If you have had past treatment for cervical cancer or a condition that could lead to cancer, you need Pap tests and screening for cancer for at least 20 years after your treatment. If Pap tests have been discontinued, your risk factors (such as having a new sexual partner) need to be reassessed to determine if screening should be resumed. Some women have medical problems that increase the chance of getting cervical cancer. In these cases, your health care provider may recommend more frequent screening and Pap tests.  The HPV test is an additional test that may be used for cervical cancer screening. The HPV test looks for the virus that can cause the cell changes on the cervix. The cells collected during the Pap test can be  tested for HPV. The HPV test could be used to screen women aged 30 years and older, and should be used in women of any age who have unclear Pap test results. After the age of 30, women should have HPV testing at the same frequency as a Pap test.  Colorectal cancer can be detected and often prevented. Most routine colorectal cancer screening begins at the age of 50 years and continues through age 75 years. However, your health care provider may recommend screening at an earlier age if you have risk factors for colon cancer. On a yearly basis, your health care provider may provide home test kits to check for hidden blood in the stool. Use of a small camera at the end of a tube, to directly examine the colon (sigmoidoscopy or colonoscopy), can detect the earliest forms of colorectal cancer. Talk to your health care provider about this at age 50, when routine screening begins. Direct  exam of the colon should be repeated every 5-10 years through age 75 years, unless early forms of pre-cancerous polyps or small growths are found.  People who are at an increased risk for hepatitis B should be screened for this virus. You are considered at high risk for hepatitis B if:  You were born in a country where hepatitis B occurs often. Talk with your health care provider about which countries are considered high risk.  Your parents were born in a high-risk country and you have not received a shot to protect against hepatitis B (hepatitis B vaccine).  You have HIV or AIDS.  You use needles to inject street drugs.  You live with, or have sex with, someone who has hepatitis B.  You get hemodialysis treatment.  You take certain medicines for conditions like cancer, organ transplantation, and autoimmune conditions.  Hepatitis C blood testing is recommended for all people born from 1945 through 1965 and any individual with known risks for hepatitis C.  Practice safe sex. Use condoms and avoid high-risk sexual practices to reduce the spread of sexually transmitted infections (STIs). STIs include gonorrhea, chlamydia, syphilis, trichomonas, herpes, HPV, and human immunodeficiency virus (HIV). Herpes, HIV, and HPV are viral illnesses that have no cure. They can result in disability, cancer, and death.  You should be screened for sexually transmitted illnesses (STIs) including gonorrhea and chlamydia if:  You are sexually active and are younger than 24 years.  You are older than 24 years and your health care provider tells you that you are at risk for this type of infection.  Your sexual activity has changed since you were last screened and you are at an increased risk for chlamydia or gonorrhea. Ask your health care provider if you are at risk.  If you are at risk of being infected with HIV, it is recommended that you take a prescription medicine daily to prevent HIV infection. This is  called preexposure prophylaxis (PrEP). You are considered at risk if:  You are a heterosexual woman, are sexually active, and are at increased risk for HIV infection.  You take drugs by injection.  You are sexually active with a partner who has HIV.  Talk with your health care provider about whether you are at high risk of being infected with HIV. If you choose to begin PrEP, you should first be tested for HIV. You should then be tested every 3 months for as long as you are taking PrEP.  Osteoporosis is a disease in which the bones lose minerals and strength   with aging. This can result in serious bone fractures or breaks. The risk of osteoporosis can be identified using a bone density scan. Women ages 65 years and over and women at risk for fractures or osteoporosis should discuss screening with their health care providers. Ask your health care provider whether you should take a calcium supplement or vitamin D to reduce the rate of osteoporosis.  Menopause can be associated with physical symptoms and risks. Hormone replacement therapy is available to decrease symptoms and risks. You should talk to your health care provider about whether hormone replacement therapy is right for you.  Use sunscreen. Apply sunscreen liberally and repeatedly throughout the day. You should seek shade when your shadow is shorter than you. Protect yourself by wearing long sleeves, pants, a wide-brimmed hat, and sunglasses year round, whenever you are outdoors.  Once a month, do a whole body skin exam, using a mirror to look at the skin on your back. Tell your health care provider of new moles, moles that have irregular borders, moles that are larger than a pencil eraser, or moles that have changed in shape or color.  Stay current with required vaccines (immunizations).  Influenza vaccine. All adults should be immunized every year.  Tetanus, diphtheria, and acellular pertussis (Td, Tdap) vaccine. Pregnant women should  receive 1 dose of Tdap vaccine during each pregnancy. The dose should be obtained regardless of the length of time since the last dose. Immunization is preferred during the 27th-36th week of gestation. An adult who has not previously received Tdap or who does not know her vaccine status should receive 1 dose of Tdap. This initial dose should be followed by tetanus and diphtheria toxoids (Td) booster doses every 10 years. Adults with an unknown or incomplete history of completing a 3-dose immunization series with Td-containing vaccines should begin or complete a primary immunization series including a Tdap dose. Adults should receive a Td booster every 10 years.  Varicella vaccine. An adult without evidence of immunity to varicella should receive 2 doses or a second dose if she has previously received 1 dose. Pregnant females who do not have evidence of immunity should receive the first dose after pregnancy. This first dose should be obtained before leaving the health care facility. The second dose should be obtained 4-8 weeks after the first dose.  Human papillomavirus (HPV) vaccine. Females aged 13-26 years who have not received the vaccine previously should obtain the 3-dose series. The vaccine is not recommended for use in pregnant females. However, pregnancy testing is not needed before receiving a dose. If a female is found to be pregnant after receiving a dose, no treatment is needed. In that case, the remaining doses should be delayed until after the pregnancy. Immunization is recommended for any person with an immunocompromised condition through the age of 26 years if she did not get any or all doses earlier. During the 3-dose series, the second dose should be obtained 4-8 weeks after the first dose. The third dose should be obtained 24 weeks after the first dose and 16 weeks after the second dose.  Zoster vaccine. One dose is recommended for adults aged 60 years or older unless certain conditions are  present.  Measles, mumps, and rubella (MMR) vaccine. Adults born before 1957 generally are considered immune to measles and mumps. Adults born in 1957 or later should have 1 or more doses of MMR vaccine unless there is a contraindication to the vaccine or there is laboratory evidence of immunity to   each of the three diseases. A routine second dose of MMR vaccine should be obtained at least 28 days after the first dose for students attending postsecondary schools, health care workers, or international travelers. People who received inactivated measles vaccine or an unknown type of measles vaccine during 1963-1967 should receive 2 doses of MMR vaccine. People who received inactivated mumps vaccine or an unknown type of mumps vaccine before 1979 and are at high risk for mumps infection should consider immunization with 2 doses of MMR vaccine. For females of childbearing age, rubella immunity should be determined. If there is no evidence of immunity, females who are not pregnant should be vaccinated. If there is no evidence of immunity, females who are pregnant should delay immunization until after pregnancy. Unvaccinated health care workers born before 1957 who lack laboratory evidence of measles, mumps, or rubella immunity or laboratory confirmation of disease should consider measles and mumps immunization with 2 doses of MMR vaccine or rubella immunization with 1 dose of MMR vaccine.  Pneumococcal 13-valent conjugate (PCV13) vaccine. When indicated, a person who is uncertain of her immunization history and has no record of immunization should receive the PCV13 vaccine. An adult aged 19 years or older who has certain medical conditions and has not been previously immunized should receive 1 dose of PCV13 vaccine. This PCV13 should be followed with a dose of pneumococcal polysaccharide (PPSV23) vaccine. The PPSV23 vaccine dose should be obtained at least 8 weeks after the dose of PCV13 vaccine. An adult aged 19  years or older who has certain medical conditions and previously received 1 or more doses of PPSV23 vaccine should receive 1 dose of PCV13. The PCV13 vaccine dose should be obtained 1 or more years after the last PPSV23 vaccine dose.  Pneumococcal polysaccharide (PPSV23) vaccine. When PCV13 is also indicated, PCV13 should be obtained first. All adults aged 65 years and older should be immunized. An adult younger than age 65 years who has certain medical conditions should be immunized. Any person who resides in a nursing home or long-term care facility should be immunized. An adult smoker should be immunized. People with an immunocompromised condition and certain other conditions should receive both PCV13 and PPSV23 vaccines. People with human immunodeficiency virus (HIV) infection should be immunized as soon as possible after diagnosis. Immunization during chemotherapy or radiation therapy should be avoided. Routine use of PPSV23 vaccine is not recommended for American Indians, [place] Natives, or people younger than 65 years unless there are medical conditions that require PPSV23 vaccine. When indicated, people who have unknown immunization and have no record of immunization should receive PPSV23 vaccine. One-time revaccination 5 years after the first dose of PPSV23 is recommended for people aged 19-64 years who have chronic kidney failure, nephrotic syndrome, asplenia, or immunocompromised conditions. People who received 1-2 doses of PPSV23 before age 65 years should receive another dose of PPSV23 vaccine at age 65 years or later if at least 5 years have passed since the previous dose. Doses of PPSV23 are not needed for people immunized with PPSV23 at or after age 65 years.  Meningococcal vaccine. Adults with asplenia or persistent complement component deficiencies should receive 2 doses of quadrivalent meningococcal conjugate (MenACWY-D) vaccine. The doses should be obtained at least 2 months apart.  Microbiologists working with certain meningococcal bacteria, military recruits, people at risk during an outbreak, and people who travel to or live in countries with a high rate of meningitis should be immunized. A first-year college student up through age   21 years who is living in a residence hall should receive a dose if she did not receive a dose on or after her 16th birthday. Adults who have certain high-risk conditions should receive one or more doses of vaccine.  Hepatitis A vaccine. Adults who wish to be protected from this disease, have certain high-risk conditions, work with hepatitis A-infected animals, work in hepatitis A research labs, or travel to or work in countries with a high rate of hepatitis A should be immunized. Adults who were previously unvaccinated and who anticipate close contact with an international adoptee during the first 60 days after arrival in the [place] from a country with a high rate of hepatitis A should be immunized.  Hepatitis B vaccine. Adults who wish to be protected from this disease, have certain high-risk conditions, may be exposed to blood or other infectious body fluids, are household contacts or sex partners of hepatitis B positive people, are clients or workers in certain care facilities, or travel to or work in countries with a high rate of hepatitis B should be immunized.  Haemophilus influenzae type b (Hib) vaccine. A previously unvaccinated person with asplenia or sickle cell disease or having a scheduled splenectomy should receive 1 dose of Hib vaccine. Regardless of previous immunization, a recipient of a hematopoietic stem cell transplant should receive a 3-dose series 6-12 months after her successful transplant. Hib vaccine is not recommended for adults with HIV infection. Preventive Services / Frequency Ages 64 to 68 years  Blood pressure check.** / Every 1 to 2 years.  Lipid and cholesterol check.** / Every 5 years beginning at age  22.  Clinical breast exam.** / Every 3 years for women in their 88s and 53s.  BRCA-related cancer risk assessment.** / For women who have family members with a BRCA-related cancer (breast, ovarian, tubal, or peritoneal cancers).  Pap test.** / Every 2 years from ages 90 through 51. Every 3 years starting at age 21 through age 56 or 3 with a history of 3 consecutive normal Pap tests.  HPV screening.** / Every 3 years from ages 24 through ages 1 to 46 with a history of 3 consecutive normal Pap tests.  Hepatitis C blood test.** / For any individual with known risks for hepatitis C.  Skin self-exam. / Monthly.  Influenza vaccine. / Every year.  Tetanus, diphtheria, and acellular pertussis (Tdap, Td) vaccine.** / Consult your health care provider. Pregnant women should receive 1 dose of Tdap vaccine during each pregnancy. 1 dose of Td every 10 years.  Varicella vaccine.** / Consult your health care provider. Pregnant females who do not have evidence of immunity should receive the first dose after pregnancy.  HPV vaccine. / 3 doses over 6 months, if 72 and younger. The vaccine is not recommended for use in pregnant females. However, pregnancy testing is not needed before receiving a dose.  Measles, mumps, rubella (MMR) vaccine.** / You need at least 1 dose of MMR if you were born in 1957 or later. You may also need a 2nd dose. For females of childbearing age, rubella immunity should be determined. If there is no evidence of immunity, females who are not pregnant should be vaccinated. If there is no evidence of immunity, females who are pregnant should delay immunization until after pregnancy.  Pneumococcal 13-valent conjugate (PCV13) vaccine.** / Consult your health care provider.  Pneumococcal polysaccharide (PPSV23) vaccine.** / 1 to 2 doses if you smoke cigarettes or if you have certain conditions.  Meningococcal vaccine.** /  1 dose if you are age 19 to 21 years and a first-year college  student living in a residence hall, or have one of several medical conditions, you need to get vaccinated against meningococcal disease. You may also need additional booster doses.  Hepatitis A vaccine.** / Consult your health care provider.  Hepatitis B vaccine.** / Consult your health care provider.  Haemophilus influenzae type b (Hib) vaccine.** / Consult your health care provider. Ages 40 to 64 years  Blood pressure check.** / Every 1 to 2 years.  Lipid and cholesterol check.** / Every 5 years beginning at age 20 years.  Lung cancer screening. / Every year if you are aged 55-80 years and have a 30-pack-year history of smoking and currently smoke or have quit within the past 15 years. Yearly screening is stopped once you have quit smoking for at least 15 years or develop a health problem that would prevent you from having lung cancer treatment.  Clinical breast exam.** / Every year after age 40 years.  BRCA-related cancer risk assessment.** / For women who have family members with a BRCA-related cancer (breast, ovarian, tubal, or peritoneal cancers).  Mammogram.** / Every year beginning at age 40 years and continuing for as long as you are in good health. Consult with your health care provider.  Pap test.** / Every 3 years starting at age 30 years through age 65 or 70 years with a history of 3 consecutive normal Pap tests.  HPV screening.** / Every 3 years from ages 30 years through ages 65 to 70 years with a history of 3 consecutive normal Pap tests.  Fecal occult blood test (FOBT) of stool. / Every year beginning at age 50 years and continuing until age 75 years. You may not need to do this test if you get a colonoscopy every 10 years.  Flexible sigmoidoscopy or colonoscopy.** / Every 5 years for a flexible sigmoidoscopy or every 10 years for a colonoscopy beginning at age 50 years and continuing until age 75 years.  Hepatitis C blood test.** / For all people born from 1945 through  1965 and any individual with known risks for hepatitis C.  Skin self-exam. / Monthly.  Influenza vaccine. / Every year.  Tetanus, diphtheria, and acellular pertussis (Tdap/Td) vaccine.** / Consult your health care provider. Pregnant women should receive 1 dose of Tdap vaccine during each pregnancy. 1 dose of Td every 10 years.  Varicella vaccine.** / Consult your health care provider. Pregnant females who do not have evidence of immunity should receive the first dose after pregnancy.  Zoster vaccine.** / 1 dose for adults aged 60 years or older.  Measles, mumps, rubella (MMR) vaccine.** / You need at least 1 dose of MMR if you were born in 1957 or later. You may also need a 2nd dose. For females of childbearing age, rubella immunity should be determined. If there is no evidence of immunity, females who are not pregnant should be vaccinated. If there is no evidence of immunity, females who are pregnant should delay immunization until after pregnancy.  Pneumococcal 13-valent conjugate (PCV13) vaccine.** / Consult your health care provider.  Pneumococcal polysaccharide (PPSV23) vaccine.** / 1 to 2 doses if you smoke cigarettes or if you have certain conditions.  Meningococcal vaccine.** / Consult your health care provider.  Hepatitis A vaccine.** / Consult your health care provider.  Hepatitis B vaccine.** / Consult your health care provider.  Haemophilus influenzae type b (Hib) vaccine.** / Consult your health care provider. Ages 65   years and over  Blood pressure check.** / Every 1 to 2 years.  Lipid and cholesterol check.** / Every 5 years beginning at age 61 years.  Lung cancer screening. / Every year if you are aged 74-80 years and have a 30-pack-year history of smoking and currently smoke or have quit within the past 15 years. Yearly screening is stopped once you have quit smoking for at least 15 years or develop a health problem that would prevent you from having lung cancer  treatment.  Clinical breast exam.** / Every year after age 59 years.  BRCA-related cancer risk assessment.** / For women who have family members with a BRCA-related cancer (breast, ovarian, tubal, or peritoneal cancers).  Mammogram.** / Every year beginning at age 14 years and continuing for as long as you are in good health. Consult with your health care provider.  Pap test.** / Every 3 years starting at age 73 years through age 40 or 48 years with 3 consecutive normal Pap tests. Testing can be stopped between 65 and 70 years with 3 consecutive normal Pap tests and no abnormal Pap or HPV tests in the past 10 years.  HPV screening.** / Every 3 years from ages 63 years through ages 45 or 74 years with a history of 3 consecutive normal Pap tests. Testing can be stopped between 65 and 70 years with 3 consecutive normal Pap tests and no abnormal Pap or HPV tests in the past 10 years.  Fecal occult blood test (FOBT) of stool. / Every year beginning at age 59 years and continuing until age 88 years. You may not need to do this test if you get a colonoscopy every 10 years.  Flexible sigmoidoscopy or colonoscopy.** / Every 5 years for a flexible sigmoidoscopy or every 10 years for a colonoscopy beginning at age 25 years and continuing until age 11 years.  Hepatitis C blood test.** / For all people born from [birth year] through 1967 and any individual with known risks for hepatitis C.  Osteoporosis screening.** / A one-time screening for women ages 51 years and over and women at risk for fractures or osteoporosis.  Skin self-exam. / Monthly.  Influenza vaccine. / Every year.  Tetanus, diphtheria, and acellular pertussis (Tdap/Td) vaccine.** / 1 dose of Td every 10 years.  Varicella vaccine.** / Consult your health care provider.  Zoster vaccine.** / 1 dose for adults aged 62 years or older.  Pneumococcal 13-valent conjugate (PCV13) vaccine.** / Consult your health care provider.  Pneumococcal  polysaccharide (PPSV23) vaccine.** / 1 dose for all adults aged 83 years and older.  Meningococcal vaccine.** / Consult your health care provider.  Hepatitis A vaccine.** / Consult your health care provider.  Hepatitis B vaccine.** / Consult your health care provider.  Haemophilus influenzae type b (Hib) vaccine.** / Consult your health care provider. ** Family history and personal history of risk and conditions may change your health care provider's recommendations. Document Released: 06/15/2001 Document Revised: 09/03/2013 Document Reviewed: 09/14/2010 [facility] Patient Information 2015 [place], [place]. This information is not intended to replace advice given to you by your health care provider. Make sure you discuss any questions you have with your health care provider.  Please get labs done today. 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 If you have problems with referal let us know.

## 2014-02-19 NOTE — Assessment & Plan Note (Addendum)
Will get cbc, cmp and lipid panel today fasting.   Pt will refer herself to urologist regarding recurrent uti. If she has problems making self referral she will notify me and I will make referral.

## 2014-02-19 NOTE — Progress Notes (Signed)
Subjective:    Patient ID: Brooke [name], female    DOB: 1971-02-28, 43 y.o.   MRN: [id number]  HPI   Pt in for wellness exam. Pt last pap was in 2012. Pt needs mammogram. 2 done since 43 yo. All have been normal. More than 1 yr since last mammogram. Pt is married. No vaginal symptom.  Pt does not smoke. No alcohol. Pt already had fluvaccine 1 wk or so ago here in our office. She is up to date on her tetanus.  No acute complaints today.  No alcohol use, non smoker, and no drug use.  No family history of colon cancer.  Past Medical History  Diagnosis Date  . Depression   . Incontinence   . Hyperglycemia 03/05/2013    History   Social History  . Marital Status: Married    Spouse Name: N/A    Number of Children: N/A  . Years of Education: 16   Occupational History  . RN/OR [employer]   Social History Main Topics  . Smoking status: Never Smoker   . Smokeless tobacco: Never Used  . Alcohol Use: No  . Drug Use: No  . Sexual Activity: Yes    Partners: Male   Other Topics Concern  . Not on file   Social History Narrative  . No narrative on file    Past Surgical History  Procedure Laterality Date  . Rhinoplasty    . Tonsillectomy    . Sinus endo w/fusion      Family History  Problem Relation Age of Onset  . Thyroid disease Mother   . Diabetes Father   . Hypertension Father   . Hypertension Paternal Uncle   . Diabetes Paternal Grandmother   . Diabetes Paternal Aunt     No Known Allergies  Current Outpatient Prescriptions on File Prior to Visit  Medication Sig Dispense Refill  . alendronate (FOSAMAX) 70 MG tablet Take 1 tablet (70 mg total) by mouth every 7 (seven) days. Take with a full glass of water on an empty stomach.  12 tablet  3  . ALPRAZolam (XANAX) 0.5 MG tablet Take by mouth at bedtime as needed. Take 2 tab at bedtime       . ARIPiprazole (ABILIFY) 5 MG tablet Take 2.5 mg by mouth daily.        . diclofenac (VOLTAREN) 75 MG EC tablet Take  1 tablet (75 mg total) by mouth 2 (two) times daily.  30 tablet  0  . PARoxetine (PAXIL) 20 MG tablet Take 80 mg by mouth every morning.        . potassium chloride SA (K-DUR,KLOR-CON) 20 MEQ tablet TAKE 1 TABLET (20 MEQ) BY MOUTH DAILY.  90 tablet  1  . topiramate (TOPAMAX) 100 MG tablet Take 100 mg by mouth daily.        . traZODone (DESYREL) 25 mg TABS Take 25-50 mg by mouth at bedtime.      Marland Kitchen zolpidem (AMBIEN) 5 MG tablet Take 5 mg by mouth at bedtime as needed for sleep.      . ciprofloxacin (CIPRO) 500 MG tablet Take 1 tablet (500 mg total) by mouth 2 (two) times daily.  10 tablet  0  . phenazopyridine (PYRIDIUM) 200 MG tablet Take 1 tablet (200 mg total) by mouth 3 (three) times daily as needed for pain.  10 tablet  0   No current facility-administered medications on file prior to visit.    BP 103/78  Pulse 71  Temp(Src) 98.9 F (37.2 C) (Oral)  Ht 5' 3.3" (1.608 m)  Wt 170 lb (77.111 kg)  BMI 29.82 kg/m2  SpO2 95%  LMP 01/25/2014    Review of Systems  Constitutional: Negative for fever, chills and fatigue.  HENT: Negative.   Respiratory: Negative for cough, chest tightness, shortness of breath and wheezing.   Cardiovascular: Negative for chest pain and palpitations.  Gastrointestinal: Negative for nausea, abdominal pain, diarrhea, constipation, blood in stool and abdominal distention.  Genitourinary: Negative.        Recent urinary tract symptoms(x3 past 6 months) but no symptoms today. She will refer herself to urologist. If she has any problems will notify me and I will make for her.  Recently seen by MD at other practice who recommended she see urologist.  Musculoskeletal: Negative.   Neurological: Negative.   Hematological: Negative for adenopathy. Does not bruise/bleed easily.  Psychiatric/Behavioral: Negative.        Objective:   Physical Exam  General   Mental Status- Alert.  Orientation-Oriented x3. Build and Nutrition Well Nourished and Well  Developed.  Skin General: Normal.  Color- Normal color. Moisture- Dry.Temperature warm. Lesions: No suspicious lesions  Head, Eyes, Ears, Nose, Thoat Ears-Normal. Auditory Canal-Bilateral-Normal. Tympanic Membrane- Bilateral-Normal. Eyes Fundi- Bilateral-Normal. Pupil- Bilateral- Direct reaction to light normal. Nose & Sinuses- Normal. Nostril- Bilateral-Normal.  Neck Neck- No Bruits or Masses. Thyroid- Normal. No thyromegaly or nodules.  Breast Breast Lump: No palpable masses, symmetric, no axillary lymphadenopathy palpated.  Chest and Lung Exam  Percussion: Quality and Intensity:-Percussion normal. Percussion of chest reveals- No Dullness. Palpation of the chest reveals- Non-tender. Auscultation: Breath sounds-Normal. Adventitious  Sounds:No adventitious   Vaginal External: Labia majora and minora normal/no lesions. Pelvic/Bimanual exam: Cervical OS not red or friable. No discharge. No cervical motion tenderness. No masses felt on palpation of adnexal regions. Cardiovascular Inspection: No Heaves. Auscultation: Heart Sounds- Normal sinus rhythm without murmur or gallop, S1 WNL and S2 WNL.  Abdomen Inspection:- Inspection Normal. Inspection of abdomen reveals- No Hernias. Palpation/Percussion: Palpation and Percussion of the abdomen reveal- Non Tender and No Palpable masses. Liver: Other Characteristics- No Hepatmegaly Spleen:Other Characteristics- No Splenomegaly. Auscultation: Auscultation of the abdomen reveals-Bowel sounds normal and No Abdominal bruits.   Neurologic Mental Status- Normal Cranial Nerves- Normal Bilaterally, Motor- Normal. Strength:5/5 normal muscle strength- All Muscles. General Assessment of Reflexes- Right Knee- 2+. Left Knee- 2+. Coordination- Normal. Gait- Normal. Meningeal Signs- None.  Musculoskeletal Global Assessment General- Joints show full range of motion without obvious deformity and Normal muscle mass. Strength 5/5 in upper and  lower extremities.  Lymphatic General lymphatics Description-No Generalized lymphadenopathy.           Assessment & Plan:

## 2014-02-19 NOTE — Progress Notes (Signed)
Pre visit review using our clinic review tool, if applicable. No additional management support is needed unless otherwise documented below in the visit note. 

## 2014-02-20 LAB — CYTOLOGY - PAP

## 2014-03-20 ENCOUNTER — Ambulatory Visit
Admission: RE | Admit: 2014-03-20 | Discharge: 2014-03-20 | Disposition: A | Discharge status: Home / Self Care | Payer: 59 | Source: Ambulatory Visit | Attending: Medical | Admitting: Medical

## 2014-03-20 DIAGNOSIS — Z1239 Encounter for other screening for malignant neoplasm of breast: Secondary | ICD-10-CM

## 2014-03-28 ENCOUNTER — Encounter (HOSPITAL_COMMUNITY): Payer: Self-pay | Admitting: Emergency Medicine

## 2014-03-28 ENCOUNTER — Emergency Department (HOSPITAL_COMMUNITY): Payer: PRIVATE HEALTH INSURANCE

## 2014-03-28 ENCOUNTER — Emergency Department (HOSPITAL_COMMUNITY)
Admission: EM | Admit: 2014-03-28 | Discharge: 2014-03-28 | Disposition: A | Discharge status: Home / Self Care | Payer: PRIVATE HEALTH INSURANCE | Attending: Emergency Medicine | Admitting: Emergency Medicine

## 2014-03-28 DIAGNOSIS — R52 Pain, unspecified: Secondary | ICD-10-CM

## 2014-03-28 DIAGNOSIS — M810 Age-related osteoporosis without current pathological fracture: Secondary | ICD-10-CM | POA: Diagnosis not present

## 2014-03-28 DIAGNOSIS — M545 Low back pain: Secondary | ICD-10-CM | POA: Diagnosis not present

## 2014-03-28 DIAGNOSIS — F329 Major depressive disorder, single episode, unspecified: Secondary | ICD-10-CM | POA: Insufficient documentation

## 2014-03-28 DIAGNOSIS — Z79899 Other long term (current) drug therapy: Secondary | ICD-10-CM | POA: Insufficient documentation

## 2014-03-28 DIAGNOSIS — F419 Anxiety disorder, unspecified: Secondary | ICD-10-CM | POA: Diagnosis not present

## 2014-03-28 HISTORY — DX: Age-related osteoporosis without current pathological fracture: M81.0

## 2014-03-28 HISTORY — DX: Anxiety disorder, unspecified: F41.9

## 2014-03-28 MED ORDER — KETOROLAC TROMETHAMINE 30 MG/ML IJ SOLN
30.0000 mg | Freq: Once | INTRAMUSCULAR | Status: AC
  Administered 2014-03-28: 30 mg via INTRAMUSCULAR
  Filled 2014-03-28: qty 1

## 2014-03-28 NOTE — Discharge Instructions (Signed)
As discussed, there is no change in the x-ray of your lower back.  Please continue to take your ibuprofen for pain relief.  Follow-up with your primary care physician as needed

## 2014-03-28 NOTE — ED Provider Notes (Addendum)
CSN: [id number]     Arrival date & time 03/28/14  6578 History   First MD Initiated Contact with Patient 03/28/14 0410     Chief Complaint  Patient presents with  . Back Pain     (Consider location/radiation/quality/duration/timing/severity/associated sxs/prior Treatment) HPI Comments: Hx of Lumbar L1 compression Fx now with increased pain   The history is provided by the patient.    Past Medical History  Diagnosis Date  . Depression   . Incontinence   . Hyperglycemia 03/05/2013  . Anxiety   . Osteoporosis    Past Surgical History  Procedure Laterality Date  . Rhinoplasty    . Tonsillectomy    . Sinus endo w/fusion     Family History  Problem Relation Age of Onset  . Thyroid disease Mother   . Diabetes Father   . Hypertension Father   . Hypertension Paternal Uncle   . Diabetes Paternal Grandmother   . Diabetes Paternal Aunt    History  Substance Use Topics  . Smoking status: Never Smoker   . Smokeless tobacco: Never Used  . Alcohol Use: No   OB History    No data available     Review of Systems  Constitutional: Negative for fever.  Genitourinary: Negative for dysuria.  Musculoskeletal: Positive for back pain.  All other systems reviewed and are negative.     Allergies  Review of patient's allergies indicates no known allergies.  Home Medications   Prior to Admission medications   Medication Sig Start Date End Date Taking? Authorizing Provider  alendronate (FOSAMAX) 70 MG tablet Take 1 tablet (70 mg total) by mouth every 7 (seven) days. Take with a full glass of water on an empty stomach. 12/19/13  Yes Meriam Sprague Saguier, PA-C  ALPRAZolam Duanne Moron) 0.5 MG tablet Take 1 mg by mouth at bedtime as needed for sleep.    Yes Historical Provider, MD  ARIPiprazole (ABILIFY) 5 MG tablet Take 2.5 mg by mouth daily.     Yes Historical Provider, MD  calcium citrate-vitamin D (CITRACAL+D) 315-200 MG-UNIT per tablet Take 2 tablets by mouth daily.   Yes Historical  Provider, MD  cholecalciferol (VITAMIN D) 1000 UNITS tablet Take 1,000 Units by mouth daily.   Yes Historical Provider, MD  Cranberry 1000 MG CAPS Take 1 capsule by mouth 2 (two) times daily.   Yes Historical Provider, MD  ibuprofen (ADVIL,MOTRIN) 200 MG tablet Take 400 mg by mouth every 6 (six) hours as needed for moderate pain.   Yes Historical Provider, MD  PARoxetine (PAXIL) 20 MG tablet Take 80 mg by mouth at bedtime.    Yes Historical Provider, MD  potassium chloride SA (K-DUR,KLOR-CON) 20 MEQ tablet TAKE 1 TABLET (20 MEQ) BY MOUTH DAILY. 02/11/14  Yes Brunetta Jeans, PA-C  topiramate (TOPAMAX) 100 MG tablet Take 100 mg by mouth at bedtime.    Yes Historical Provider, MD  traZODone (DESYREL) 25 mg TABS Take 25-50 mg by mouth at bedtime as needed for sleep.    Yes Historical Provider, MD  zolpidem (AMBIEN) 5 MG tablet Take 5 mg by mouth at bedtime as needed for sleep.   Yes Historical Provider, MD  ciprofloxacin (CIPRO) 500 MG tablet Take 1 tablet (500 mg total) by mouth 2 (two) times daily. Patient not taking: Reported on 03/28/2014 02/12/14   Mosie Lukes, MD  diclofenac (VOLTAREN) 75 MG EC tablet Take 1 tablet (75 mg total) by mouth 2 (two) times daily. Patient not taking: Reported on 03/28/2014 11/28/13  Meriam Sprague Saguier, PA-C  phenazopyridine (PYRIDIUM) 200 MG tablet Take 1 tablet (200 mg total) by mouth 3 (three) times daily as needed for pain. Patient not taking: Reported on 03/28/2014 02/06/14   Hendricks Limes, MD   BP 113/72 mmHg  Pulse 94  Temp(Src) 98.3 F (36.8 C) (Oral)  Resp 20  SpO2 100%  LMP 03/21/2014 (Exact Date) Physical Exam  Constitutional: She appears well-developed and well-nourished.  HENT:  Head: Normocephalic.  Eyes: Pupils are equal, round, and reactive to light.  Neck: Normal range of motion.  Cardiovascular: Normal rate.   Pulmonary/Chest: Effort normal.  Abdominal: Soft.  Musculoskeletal: Normal range of motion. She exhibits no edema or  tenderness.  Neurological: She is alert.  Skin: Skin is warm.    ED Course  Procedures (including critical care time) Labs Review Labs Reviewed - No data to display  Imaging Review Dg Lumbar Spine Complete  03/28/2014   CLINICAL DATA:  Initial evaluation for acute low back pain.  EXAM: LUMBAR SPINE - COMPLETE 4+ VIEW  COMPARISON:  Prior MRI from 12/11/2013  FINDINGS: Minimal levoscoliosis of the lumbar spine is present. Otherwise, vertebral bodies are normally aligned with preservation of the normal lumbar lordosis. Chronic anterior compression deformity of L1 is stable. No progressive height loss. Vertebral body heights are otherwise preserved. No acute fracture listhesis. Next item mild degenerative disc disease at L5-S1 is similar.  No paraspinous soft tissue abnormality.  IMPRESSION: 1. No acute abnormality within the lumbar spine. 2. Stable chronic compression deformity of L1. 3. Mild degenerative disc disease at L5-S1.   Electronically Signed   By: Jeannine Boga M.D.   On: 03/28/2014 04:37     EKG Interpretation None     X-ray of LS-spine reveals no change in her L 1 compression deformity.  She does have some degenerative disc disease from L5-S1.  Patient has been informed of these findings.  She'll be given IM Toradol per her request and  follow-up with her primary care physician as needed MDM   Final diagnoses:  Pain  Low back pain without sciatica, unspecified back pain laterality         Garald Balding, NP 03/28/14 [place], MD 03/28/14 2585  Garald Balding, NP 04/15/14 2004  Evelina Bucy, MD 04/19/14 [phone]

## 2014-03-28 NOTE — ED Notes (Signed)
Pt states she has a hx of a lumbar compression fx of L1 a few months ago and she was lifting pans in the OR and felt pain in her mid back  Pt describes the pain as a pressure

## 2014-04-08 ENCOUNTER — Other Ambulatory Visit: Payer: 59

## 2014-06-19 ENCOUNTER — Other Ambulatory Visit (INDEPENDENT_AMBULATORY_CARE_PROVIDER_SITE_OTHER): Payer: 59

## 2014-06-19 DIAGNOSIS — R946 Abnormal results of thyroid function studies: Secondary | ICD-10-CM

## 2014-06-19 LAB — TSH: TSH: 1.633 u[IU]/mL (ref 0.350–4.500)

## 2014-06-19 LAB — T3, FREE: T3 FREE: 3 pg/mL (ref 2.3–4.2)

## 2014-06-19 LAB — T4, FREE: FREE T4: 0.87 ng/dL (ref 0.80–1.80)

## 2014-06-21 ENCOUNTER — Encounter: Payer: Self-pay | Admitting: Family Medicine

## 2014-06-21 ENCOUNTER — Ambulatory Visit (INDEPENDENT_AMBULATORY_CARE_PROVIDER_SITE_OTHER): Payer: 59 | Admitting: Family Medicine

## 2014-06-21 VITALS — BP 106/72 | HR 105 | Temp 98.1°F | Wt 171.6 lb

## 2014-06-21 DIAGNOSIS — M8580 Other specified disorders of bone density and structure, unspecified site: Secondary | ICD-10-CM

## 2014-06-21 DIAGNOSIS — J0110 Acute frontal sinusitis, unspecified: Secondary | ICD-10-CM

## 2014-06-21 MED ORDER — AMOXICILLIN-POT CLAVULANATE 875-125 MG PO TABS: 1.0000 | ORAL_TABLET | Freq: Two times a day (BID) | ORAL | Status: DC

## 2014-06-21 MED ORDER — ALENDRONATE SODIUM 70 MG PO TABS: 70.0000 mg | ORAL_TABLET | ORAL | Status: DC

## 2014-06-21 MED ORDER — FLUTICASONE PROPIONATE 50 MCG/ACT NA SUSP: 2.0000 | Freq: Every day | NASAL | Status: DC

## 2014-06-21 MED ORDER — LORATADINE 10 MG PO TABS: 10.0000 mg | ORAL_TABLET | Freq: Every day | ORAL | Status: DC

## 2014-06-21 MED ORDER — METHYLPREDNISOLONE ACETATE 80 MG/ML IJ SUSP
80.0000 mg | Freq: Once | INTRAMUSCULAR | Status: AC
  Administered 2014-06-21: 80 mg via INTRAMUSCULAR

## 2014-06-21 NOTE — Addendum Note (Signed)
Addended by: Leticia Penna A on: 06/21/2014 01:52 PM   Modules accepted: Orders

## 2014-06-21 NOTE — Progress Notes (Signed)
Pre visit review using our clinic review tool, if applicable. No additional management support is needed unless otherwise documented below in the visit note. 

## 2014-06-21 NOTE — Progress Notes (Signed)
  Subjective:     Brooke [name] is a 44 y.o. female who presents for evaluation of sinus pain. Symptoms include: congestion, cough, fevers, headaches, nasal congestion, purulent rhinorrhea, sinus pressure and sore throat. Onset of symptoms was 3 days ago. Symptoms have been gradually worsening since that time. Past history is significant for no history of pneumonia or bronchitis. Patient is a non-smoker.  Took dayquil with no relief.   The following portions of the patient's history were reviewed and updated as appropriate: allergies, current medications, past family history, past medical history, past social history, past surgical history and problem list.  Review of Systems Pertinent items are noted in HPI.   Objective:    BP 106/72 mmHg  Pulse 105  Temp(Src) 98.1 F (36.7 C) (Oral)  Wt 171 lb 9.6 oz (77.837 kg)  SpO2 99%  LMP 06/14/2014 General appearance: alert, cooperative, appears stated age and no distress Head: Normocephalic, without obvious abnormality, atraumatic Ears: + fluid b/l Nose: green discharge, moderate congestion, turbinates red, swollen, sinus tenderness bilateral Throat: lips, mucosa, and tongue normal; teeth and gums normal Neck: moderate anterior cervical adenopathy, supple, symmetrical, trachea midline and thyroid not enlarged, symmetric, no tenderness/mass/nodules Lungs: clear to auscultation bilaterally Heart: S1, S2 normal    Assessment:    Acute bacterial sinusitis.   B/l serous otitis   Plan:    Nasal steroids per medication orders. Antihistamines per medication orders. Augmentin per medication orders.   Depo medrol

## 2014-06-21 NOTE — Patient Instructions (Signed)

## 2014-06-22 ENCOUNTER — Ambulatory Visit: Payer: Self-pay | Admitting: Internal Medicine

## 2014-08-10 ENCOUNTER — Emergency Department (INDEPENDENT_AMBULATORY_CARE_PROVIDER_SITE_OTHER): Payer: 59

## 2014-08-10 ENCOUNTER — Encounter (HOSPITAL_COMMUNITY): Payer: Self-pay | Admitting: Emergency Medicine

## 2014-08-10 ENCOUNTER — Emergency Department (HOSPITAL_COMMUNITY)
Admission: EM | Admit: 2014-08-10 | Discharge: 2014-08-10 | Disposition: A | Discharge status: Home / Self Care | Payer: 59 | Source: Home / Self Care | Attending: Family Medicine | Admitting: Family Medicine

## 2014-08-10 DIAGNOSIS — S6000XA Contusion of unspecified finger without damage to nail, initial encounter: Secondary | ICD-10-CM

## 2014-08-10 NOTE — ED Notes (Signed)
Reports she inj her left middle finger; jammed finger against car door Subungual hematoma; painful Alert, no signs of acute distress.

## 2014-08-10 NOTE — ED Provider Notes (Signed)
Brooke [name] is a 44 y.o. female who presents to Urgent Care today for left finger injury. Patient accidentally slammed her third digit distal phalanx on her left hand in a car door this morning. She notes pain and swelling and bruising. She denies any radiating pain weakness or numbness. She's tried 800 mg of ibuprofen which helps. No fevers chills vomiting or diarrhea.   Past Medical History  Diagnosis Date  . Depression   . Incontinence   . Hyperglycemia 03/05/2013  . Anxiety   . Osteoporosis    Past Surgical History  Procedure Laterality Date  . Rhinoplasty    . Tonsillectomy    . Sinus endo w/fusion     History  Substance Use Topics  . Smoking status: Never Smoker   . Smokeless tobacco: Never Used  . Alcohol Use: No   ROS as above Medications: No current facility-administered medications for this encounter.   Current Outpatient Prescriptions  Medication Sig Dispense Refill  . ALPRAZolam (XANAX) 0.5 MG tablet Take 1 mg by mouth at bedtime as needed for sleep.     Marland Kitchen PARoxetine (PAXIL) 20 MG tablet Take 80 mg by mouth at bedtime.     . topiramate (TOPAMAX) 100 MG tablet Take 100 mg by mouth at bedtime.     . traZODone (DESYREL) 25 mg TABS Take 25-50 mg by mouth at bedtime as needed for sleep.     Marland Kitchen zolpidem (AMBIEN) 5 MG tablet Take 5 mg by mouth at bedtime as needed for sleep.    Marland Kitchen alendronate (FOSAMAX) 70 MG tablet Take 1 tablet (70 mg total) by mouth every 7 (seven) days. Take with a full glass of water on an empty stomach. 12 tablet 3  . amoxicillin-clavulanate (AUGMENTIN) 875-125 MG per tablet Take 1 tablet by mouth 2 (two) times daily. 20 tablet 0  . ARIPiprazole (ABILIFY) 5 MG tablet Take 2.5 mg by mouth daily.      . calcium citrate-vitamin D (CITRACAL+D) 315-200 MG-UNIT per tablet Take 2 tablets by mouth daily.    . cholecalciferol (VITAMIN D) 1000 UNITS tablet Take 1,000 Units by mouth daily.    . Cranberry 1000 MG CAPS Take 1 capsule by mouth 2 (two) times  daily.    . fluticasone (FLONASE) 50 MCG/ACT nasal spray Place 2 sprays into both nostrils daily. 16 g 6  . ibuprofen (ADVIL,MOTRIN) 200 MG tablet Take 400 mg by mouth every 6 (six) hours as needed for moderate pain.    Marland Kitchen loratadine (CLARITIN) 10 MG tablet Take 1 tablet (10 mg total) by mouth daily. 30 tablet 11  . potassium chloride SA (K-DUR,KLOR-CON) 20 MEQ tablet TAKE 1 TABLET (20 MEQ) BY MOUTH DAILY. 90 tablet 1   No Known Allergies   Exam:  BP 106/72 mmHg  Pulse 90  Temp(Src) 98.5 F (36.9 C) (Oral)  Resp 16  SpO2 98%  LMP 08/08/2014 Gen: Well NAD  Exts: Brisk capillary refill, warm and well perfused.  Left hand: Third digit distal phalanx ecchymosis and swollen and tender. Subungual hematoma present at the proximal third of the nail. Motion intact as is capillary refill and sensation.  No results found for this or any previous visit (from the past 24 hour(s)). Dg Finger Middle Left  08/10/2014   CLINICAL DATA:  Car door slammed on left middle finger today.  EXAM: LEFT MIDDLE FINGER 2+V  COMPARISON:  None.  FINDINGS: There is no evidence of fracture or dislocation. There is no evidence of arthropathy or other  focal bone abnormality. Soft tissues are unremarkable.  IMPRESSION: Negative.   Electronically Signed   By: Marin Olp M.D.   On: 08/10/2014 12:21    Assessment and Plan: 44 y.o. female with finger contusion with small subungual hematoma. The size of the hematoma is quite small. I do not believe that it is worth the risk of drainage to attempt to drain the hematoma. Plan for watchful waiting and follow-up with PCP as needed.  Discussed warning signs or symptoms. Please see discharge instructions. Patient expresses understanding.     Gregor Hams, MD 08/10/14 [phone]

## 2014-08-10 NOTE — Discharge Instructions (Signed)
Thank you for coming in today. Continue Tylenol or ibuprofen for pain Return as needed   Contusion A contusion is the result of an injury to the skin and underlying tissues and is usually caused by direct trauma. The injury results in the appearance of a bruise on the skin overlying the injured tissues. Contusions cause rupture and bleeding of the small capillaries and blood vessels and affect function, because the bleeding infiltrates muscles, tendons, nerves, or other soft tissues.  SYMPTOMS   Swelling and often a hard lump in the injured area, either superficial or deep.  Pain and tenderness over the area of the contusion.  Feeling of firmness when pressure is exerted over the contusion.  Discoloration under the skin, beginning with redness and progressing to the characteristic "black and blue" bruise. CAUSES  A contusion is typically the result of direct trauma. This is often by a blunt object.  RISK INCREASES WITH:  Sports that have a high likelihood of trauma (football, boxing, ice hockey, soccer, field hockey, martial arts, basketball, and baseball).  Sports that make falling from a height likely (high-jumping, pole-vaulting, skating, or gymnastics).  Any bleeding disorder (hemophilia) or taking medications that affect clotting (aspirin, nonsteroidal anti-inflammatory medications, or warfarin [Coumadin]).  Inadequate protection of exposed areas during contact sports. PREVENTION  Maintain physical fitness:  Joint and muscle flexibility.  Strength and endurance.  Coordination.  Wear proper protective equipment. Make sure it fits correctly. PROGNOSIS  Contusions typically heal without any complications. Healing time varies with the severity of injury and intake of medications that affect clotting. Contusions usually heal in 1 to 4 weeks. RELATED COMPLICATIONS   Damage to nearby nerves or blood vessels, causing numbness, coldness, or paleness.  Compartment  syndrome.  Bleeding into the soft tissues that leads to disability.  Infiltrative-type bleeding, leading to the calcification and impaired function of the injured muscle (rare).  Prolonged healing time if usual activities are resumed too soon.  Infection if the skin over the injury site is broken.  Fracture of the bone underlying the contusion.  Stiffness in the joint where the injured muscle crosses. TREATMENT  Treatment initially consists of resting the injured area as well as medication and ice to reduce inflammation. The use of a compression bandage may also be helpful in minimizing inflammation. As pain diminishes and movement is tolerated, the joint where the affected muscle crosses should be moved to prevent stiffness and the shortening (contracture) of the joint. Movement of the joint should begin as soon as possible. It is also important to work on maintaining strength within the affected muscles. Occasionally, extra padding over the area of contusion may be recommended before returning to sports, particularly if re-injury is likely.  MEDICATION   If pain relief is necessary these medications are often recommended:  Nonsteroidal anti-inflammatory medications, such as aspirin and ibuprofen.  Other minor pain relievers, such as acetaminophen, are often recommended.  Prescription pain relievers may be given by your caregiver. Use only as directed and only as much as you need. HEAT AND COLD  Cold treatment (icing) relieves pain and reduces inflammation. Cold treatment should be applied for 10 to 15 minutes every 2 to 3 hours for inflammation and pain and immediately after any activity that aggravates your symptoms. Use ice packs or an ice massage. (To do an ice massage fill a large styrofoam cup with water and freeze. Tear a small amount of foam from the top so ice protrudes. Massage ice firmly over the injured area  in a circle about the size of a softball.)  Heat treatment may be  used prior to performing the stretching and strengthening activities prescribed by your caregiver, physical therapist, or athletic trainer. Use a heat pack or a warm soak. SEEK MEDICAL CARE IF:   Symptoms get worse or do not improve despite treatment in a few days.  You have difficulty moving a joint.  Any extremity becomes extremely painful, numb, pale, or cool (This is an emergency!).  Medication produces any side effects (bleeding, upset stomach, or allergic reaction).  Signs of infection (drainage from skin, headache, muscle aches, dizziness, fever, or general ill feeling) occur if skin was broken. Document Released: 04/19/2005 Document Revised: 07/12/2011 Document Reviewed: 08/01/2008 [facility] Patient Information 2015 [place], [place]. This information is not intended to replace advice given to you by your health care provider. Make sure you discuss any questions you have with your health care provider.   Subungual Hematoma A subungual hematoma is a pocket of blood that collects under the fingernail or toenail. The pressure created by the blood under the nail can cause pain. CAUSES  A subungual hematoma occurs when an injury to the finger or toe causes a blood vessel beneath the nail to break. The injury can occur from a direct blow such as slamming a finger in a door. It can also occur from a repeated injury such as pressure on the foot in a shoe while running. A subungual hematoma is sometimes called runner's toe or tennis toe. SYMPTOMS   Blue or dark blue skin under the nail.  Pain or throbbing in the injured area. DIAGNOSIS  Your caregiver can determine whether you have a subungual hematoma based on your history and a physical exam. If your caregiver thinks you might have a broken (fractured) bone, X-rays may be taken. TREATMENT  Hematomas usually go away on their own over time. Your caregiver may make a hole in the nail to drain the blood. Draining the blood is painless and  usually provides significant relief from pain and throbbing. The nail usually grows back normally after this procedure. In some cases, the nail may need to be removed. This is done if there is a cut under the nail that requires stitches (sutures). HOME CARE INSTRUCTIONS   Put ice on the injured area.  Put ice in a plastic bag.  Place a towel between your skin and the bag.  Leave the ice on for 15-20 minutes, 03-04 times a day for the first 1 to 2 days.  Elevate the injured area to help decrease pain and swelling.  If you were given a bandage, wear it for as long as directed by your caregiver.  If part of your nail falls off, trim the remaining nail gently. This prevents the nail from catching on something and causing further injury.  Only take over-the-counter or prescription medicines for pain, discomfort, or fever as directed by your caregiver. SEEK IMMEDIATE MEDICAL CARE IF:   You have redness or swelling around the nail.  You have yellowish-white fluid (pus) coming from the nail.  Your pain is not controlled with medicine.  You have a fever. MAKE SURE YOU:  Understand these instructions.  Will watch your condition.  Will get help right away if you are not doing well or get worse. Document Released: 04/16/2000 Document Revised: 07/12/2011 Document Reviewed: 04/07/2011 [facility] Patient Information 2015 [place], [place]. This information is not intended to replace advice given to you by your health care provider. Make sure  you discuss any questions you have with your health care provider.

## 2014-11-04 ENCOUNTER — Encounter (HOSPITAL_COMMUNITY): Payer: Self-pay

## 2014-11-04 ENCOUNTER — Emergency Department (HOSPITAL_COMMUNITY)
Admission: EM | Admit: 2014-11-04 | Discharge: 2014-11-04 | Disposition: A | Discharge status: Home / Self Care | Payer: PRIVATE HEALTH INSURANCE | Attending: Emergency Medicine | Admitting: Emergency Medicine

## 2014-11-04 ENCOUNTER — Emergency Department (HOSPITAL_COMMUNITY): Payer: PRIVATE HEALTH INSURANCE

## 2014-11-04 DIAGNOSIS — F329 Major depressive disorder, single episode, unspecified: Secondary | ICD-10-CM | POA: Diagnosis not present

## 2014-11-04 DIAGNOSIS — W010XXA Fall on same level from slipping, tripping and stumbling without subsequent striking against object, initial encounter: Secondary | ICD-10-CM | POA: Insufficient documentation

## 2014-11-04 DIAGNOSIS — Y939 Activity, unspecified: Secondary | ICD-10-CM | POA: Insufficient documentation

## 2014-11-04 DIAGNOSIS — M545 Low back pain, unspecified: Secondary | ICD-10-CM

## 2014-11-04 DIAGNOSIS — Y929 Unspecified place or not applicable: Secondary | ICD-10-CM | POA: Insufficient documentation

## 2014-11-04 DIAGNOSIS — Z3202 Encounter for pregnancy test, result negative: Secondary | ICD-10-CM | POA: Diagnosis not present

## 2014-11-04 DIAGNOSIS — W19XXXA Unspecified fall, initial encounter: Secondary | ICD-10-CM

## 2014-11-04 DIAGNOSIS — F419 Anxiety disorder, unspecified: Secondary | ICD-10-CM | POA: Diagnosis not present

## 2014-11-04 DIAGNOSIS — Y999 Unspecified external cause status: Secondary | ICD-10-CM | POA: Insufficient documentation

## 2014-11-04 DIAGNOSIS — M810 Age-related osteoporosis without current pathological fracture: Secondary | ICD-10-CM | POA: Insufficient documentation

## 2014-11-04 DIAGNOSIS — Z79899 Other long term (current) drug therapy: Secondary | ICD-10-CM | POA: Diagnosis not present

## 2014-11-04 DIAGNOSIS — S3992XA Unspecified injury of lower back, initial encounter: Secondary | ICD-10-CM | POA: Diagnosis not present

## 2014-11-04 LAB — POC URINE PREG, ED: PREG TEST UR: NEGATIVE

## 2014-11-04 MED ORDER — NAPROXEN 500 MG PO TABS: 500.0000 mg | ORAL_TABLET | Freq: Two times a day (BID) | ORAL | Status: DC

## 2014-11-04 NOTE — Discharge Instructions (Signed)
Naprosyn for pain and inflammation. Try heat and ice. No lifting greater than 15lb for 2-3 days. If pain continues follow up with primary care doctor.   Back Pain, Adult Low back pain is very common. About 1 in 5 people have back pain.The cause of low back pain is rarely dangerous. The pain often gets better over time.About half of people with a sudden onset of back pain feel better in just 2 weeks. About 8 in 10 people feel better by 6 weeks.  CAUSES Some common causes of back pain include:  Strain of the muscles or ligaments supporting the spine.  Wear and tear (degeneration) of the spinal discs.  Arthritis.  Direct injury to the back. DIAGNOSIS Most of the time, the direct cause of low back pain is not known.However, back pain can be treated effectively even when the exact cause of the pain is unknown.Answering your caregiver's questions about your overall health and symptoms is one of the most accurate ways to make sure the cause of your pain is not dangerous. If your caregiver needs more information, he or she may order lab work or imaging tests (X-rays or MRIs).However, even if imaging tests show changes in your back, this usually does not require surgery. HOME CARE INSTRUCTIONS For many people, back pain returns.Since low back pain is rarely dangerous, it is often a condition that people can learn to [facility] their own.   Remain active. It is stressful on the back to sit or stand in one place. Do not sit, drive, or stand in one place for more than 30 minutes at a time. Take short walks on level surfaces as soon as pain allows.Try to increase the length of time you walk each day.  Do not stay in bed.Resting more than 1 or 2 days can delay your recovery.  Do not avoid exercise or work.Your body is made to move.It is not dangerous to be active, even though your back may hurt.Your back will likely heal faster if you return to being active before your pain is gone.  Pay  attention to your body when you bend and lift. Many people have less discomfortwhen lifting if they bend their knees, keep the load close to their bodies,and avoid twisting. Often, the most comfortable positions are those that put less stress on your recovering back.  Find a comfortable position to sleep. Use a firm mattress and lie on your side with your knees slightly bent. If you lie on your back, put a pillow under your knees.  Only take over-the-counter or prescription medicines as directed by your caregiver. Over-the-counter medicines to reduce pain and inflammation are often the most helpful.Your caregiver may prescribe muscle relaxant drugs.These medicines help dull your pain so you can more quickly return to your normal activities and healthy exercise.  Put ice on the injured area.  Put ice in a plastic bag.  Place a towel between your skin and the bag.  Leave the ice on for 15-20 minutes, 03-04 times a day for the first 2 to 3 days. After that, ice and heat may be alternated to reduce pain and spasms.  Ask your caregiver about trying back exercises and gentle massage. This may be of some benefit.  Avoid feeling anxious or stressed.Stress increases muscle tension and can worsen back pain.It is important to recognize when you are anxious or stressed and learn ways to manage it.Exercise is a great option. SEEK MEDICAL CARE IF:  You have pain that is not relieved  with rest or medicine.  You have pain that does not improve in 1 week.  You have new symptoms.  You are generally not feeling well. SEEK IMMEDIATE MEDICAL CARE IF:   You have pain that radiates from your back into your legs.  You develop new bowel or bladder control problems.  You have unusual weakness or numbness in your arms or legs.  You develop nausea or vomiting.  You develop abdominal pain.  You feel faint. Document Released: 04/19/2005 Document Revised: 10/19/2011 Document Reviewed:  08/21/2013 [facility] Patient Information 2015 [place], [place]. This information is not intended to replace advice given to you by your health care provider. Make sure you discuss any questions you have with your health care provider.

## 2014-11-04 NOTE — ED Notes (Signed)
Patient states she fell at 0930 on a wet floor outside of OR. Patient c/o mid and lower back pain. Patient states she has a history of lumbar fracture. Patient denies any numbness or tingling of arms or legs.

## 2014-11-04 NOTE — ED Provider Notes (Signed)
CSN: [id number]     Arrival date & time 11/04/14  1501 History  This chart was scribed for non-physician practitioner, Jeannett Senior, PA-C working with Varney Biles, MD by Rayna Sexton, ED scribe. This patient was seen in room WTR5/WTR5 and the patient's care was started at 4:42 PM.    Chief Complaint  Patient presents with  . Back Pain    mid and lower back pain, histsory of fracture.   The history is provided by the patient. No language interpreter was used.    HPI Comments: Brooke [name] is a 44 y.o. female, with a history of lumbar fracture and osteoperosis, who presents to the Emergency Department complaining of mild, constant, pain to her middle and lower back with onset 7 hours ago. Pt notes slipping on a wet floor, falling backwards and landing on her back. Pt denies currently being pregnant. She denies any numbness, tingling, incontinence of her bowels or bladder, abd pain or radiation of the pain down her legs.   Past Medical History  Diagnosis Date  . Depression   . Incontinence   . Hyperglycemia 03/05/2013  . Anxiety   . Osteoporosis    Past Surgical History  Procedure Laterality Date  . Rhinoplasty    . Tonsillectomy    . Sinus endo w/fusion     Family History  Problem Relation Age of Onset  . Thyroid disease Mother   . Diabetes Father   . Hypertension Father   . Hypertension Paternal Uncle   . Diabetes Paternal Grandmother   . Diabetes Paternal Aunt    History  Substance Use Topics  . Smoking status: Never Smoker   . Smokeless tobacco: Never Used  . Alcohol Use: No   OB History    No data available     Review of Systems  Gastrointestinal: Negative for abdominal pain and diarrhea.       Denies incontinence of bowels  Genitourinary: Negative for urgency and frequency.       Denies incontinence of bladder  Musculoskeletal: Positive for myalgias and back pain. Negative for neck pain and neck stiffness.  Skin: Negative for wound.   Neurological: Negative for weakness, numbness and headaches.      Allergies  Review of patient's allergies indicates no known allergies.  Home Medications   Prior to Admission medications   Medication Sig Start Date End Date Taking? Authorizing Provider  alendronate (FOSAMAX) 70 MG tablet Take 1 tablet (70 mg total) by mouth every 7 (seven) days. Take with a full glass of water on an empty stomach. 06/21/14  Yes Rosalita Chessman, DO  ALPRAZolam (XANAX) 0.5 MG tablet Take 1 mg by mouth at bedtime as needed for sleep.    Yes Historical Provider, MD  ARIPiprazole (ABILIFY) 5 MG tablet Take 2.5 mg by mouth daily.     Yes Historical Provider, MD  calcium citrate-vitamin D (CITRACAL+D) 315-200 MG-UNIT per tablet Take 2 tablets by mouth daily.   Yes Historical Provider, MD  cholecalciferol (VITAMIN D) 1000 UNITS tablet Take 1,000 Units by mouth daily.   Yes Historical Provider, MD  Cranberry 1000 MG CAPS Take 1 capsule by mouth every evening.    Yes Historical Provider, MD  ibuprofen (ADVIL,MOTRIN) 200 MG tablet Take 400 mg by mouth every 6 (six) hours as needed for moderate pain.   Yes Historical Provider, MD  PARoxetine (PAXIL) 20 MG tablet Take 80 mg by mouth at bedtime.    Yes Historical Provider, MD  potassium chloride SA (  K-DUR,KLOR-CON) 20 MEQ tablet TAKE 1 TABLET (20 MEQ) BY MOUTH DAILY. 02/11/14  Yes Brunetta Jeans, PA-C  topiramate (TOPAMAX) 100 MG tablet Take 100 mg by mouth at bedtime.    Yes Historical Provider, MD  traZODone (DESYREL) 25 mg TABS Take 25-50 mg by mouth at bedtime as needed for sleep.    Yes Historical Provider, MD  zolpidem (AMBIEN) 5 MG tablet Take 5 mg by mouth at bedtime as needed for sleep.   Yes Historical Provider, MD  amoxicillin-clavulanate (AUGMENTIN) 875-125 MG per tablet Take 1 tablet by mouth 2 (two) times daily. Patient not taking: Reported on 11/04/2014 06/21/14   Rosalita Chessman, DO  fluticasone (FLONASE) 50 MCG/ACT nasal spray Place 2 sprays into both  nostrils daily. Patient not taking: Reported on 11/04/2014 06/21/14   Rosalita Chessman, DO  loratadine (CLARITIN) 10 MG tablet Take 1 tablet (10 mg total) by mouth daily. Patient not taking: Reported on 11/04/2014 06/21/14   Alferd Apa Lowne, DO   BP 110/69 mmHg  Pulse 100  Temp(Src) 98 F (36.7 C) (Oral)  Resp 18  SpO2 100%  LMP 10/05/2014 Physical Exam  Constitutional: She is oriented to person, place, and time. She appears well-developed and well-nourished. No distress.  HENT:  Head: Normocephalic and atraumatic.  Mouth/Throat: Oropharynx is clear and moist.  Eyes: Conjunctivae and EOM are normal. Pupils are equal, round, and reactive to light.  Neck: Normal range of motion. Neck supple. No tracheal deviation present.  Cardiovascular: Normal rate, regular rhythm and normal heart sounds.   Pulmonary/Chest: Effort normal and breath sounds normal. No respiratory distress.  Abdominal: Soft.  Musculoskeletal: She exhibits tenderness.  Midline tenderness to palpation of her lumbar spine. No paravertebral tenderness. No pain with bilateral straight leg raise.  Neurological: She is alert and oriented to person, place, and time.  5/5 and equal lower extremity strength. 2+ and equal patellar reflexes bilaterally. Pt able to dorsiflex bilateral toes and feet with good strength against resistance. Equal sensation bilaterally over thighs and lower legs. Gait is normal  Skin: Skin is warm and dry.  Psychiatric: She has a normal mood and affect. Her behavior is normal.  Nursing note and vitals reviewed.   ED Course  Procedures  DIAGNOSTIC STUDIES: Oxygen Saturation is 100% on RA, normal by my interpretation.    COORDINATION OF CARE: 4:45 PM Discussed treatment plan with pt at bedside and pt agreed to plan.  Labs Review Labs Reviewed - No data to display  Imaging Review Dg Lumbar Spine Complete  11/04/2014   CLINICAL DATA:  Slipped on water while at work, landing on back. Mid to lower back pain.  Initial encounter.  EXAM: LUMBAR SPINE - COMPLETE 4+ VIEW  COMPARISON:  MRI of the lumbar spine performed 12/11/2013, and lumbar spine radiographs performed 03/28/2014  FINDINGS: There is no evidence of acute fracture or subluxation. There is slight chronic loss of height at vertebral body L1, stable from prior studies. Vertebral bodies demonstrate normal alignment. Intervertebral disc spaces are preserved. The visualized neural foramina are grossly unremarkable in appearance.  The visualized bowel gas pattern is unremarkable in appearance; a large amount of stool is noted along the ascending colon. The sacroiliac joints are within normal limits.  IMPRESSION: No evidence of acute fracture or subluxation along the lumbar spine.   Electronically Signed   By: Garald Balding M.D.   On: 11/04/2014 18:08     EKG Interpretation None      MDM  Final diagnoses:  Midline low back pain without sciatica  Fall, initial encounter   patient with lower back pain after fall. Patient actually works here in the hospital. History of lumbar fractures. Given midline tenderness with history of fracture will get x-ray. Discussed with Dr.Nanavati, who has seen her as well, plan to discharge home if x-ray negative.  6:29 PM X-rays negative, patient is to follow-up with primary care doctor for recheck if pain continues. This time no evidence of cauda equina. Vital signs are normal. Neurovascularly intact. Home with naproxen  Filed Vitals:   11/04/14 1510  BP: 110/69  Pulse: 100  Temp: 98 F (36.7 C)  TempSrc: Oral  Resp: 18  SpO2: 100%   I personally performed the services described in this documentation, which was scribed in my presence. The recorded information has been reviewed and is accurate.   Jeannett Senior, PA-C 11/04/14 [place], MD 11/07/14 [phone]

## 2015-03-17 ENCOUNTER — Encounter: Payer: Self-pay | Admitting: Family Medicine

## 2015-03-17 ENCOUNTER — Ambulatory Visit (INDEPENDENT_AMBULATORY_CARE_PROVIDER_SITE_OTHER): Payer: 59 | Admitting: Family Medicine

## 2015-03-17 MED ORDER — SAXENDA 18 MG/3ML ~~LOC~~ SOPN: 3.0000 mg | PEN_INJECTOR | Freq: Every day | SUBCUTANEOUS | Status: DC

## 2015-03-17 NOTE — Patient Instructions (Signed)

## 2015-03-17 NOTE — Progress Notes (Signed)
Pre visit review using our clinic review tool, if applicable. No additional management support is needed unless otherwise documented below in the visit note. 

## 2015-03-17 NOTE — Assessment & Plan Note (Signed)
saxenda 0.6 mg qd for 1 week and inc by  0.6 mg weekly to max 3.0 mg Discussed diet and exercise Recheck 1 month

## 2015-03-17 NOTE — Progress Notes (Signed)
Patient ID: Brooke [name], female    DOB: 1970-06-02  Age: 44 y.o. MRN: [id number]    Subjective:  Subjective HPI Brooke [name] presents to discuss weight loss.  She is on several meds that cause weight gain and she is struggling with losing it.  Pt is not exercising like she should but will start.    Review of Systems  Constitutional: Negative for diaphoresis, appetite change, fatigue and unexpected weight change.  Eyes: Negative for pain, redness and visual disturbance.  Respiratory: Negative for cough, chest tightness, shortness of breath and wheezing.   Cardiovascular: Negative for chest pain, palpitations and leg swelling.  Endocrine: Negative for cold intolerance, heat intolerance, polydipsia, polyphagia and polyuria.  Genitourinary: Negative for dysuria, frequency and difficulty urinating.  Neurological: Negative for dizziness, light-headedness, numbness and headaches.    History Past Medical History  Diagnosis Date  . Depression   . Incontinence   . Hyperglycemia 03/05/2013  . Anxiety   . Osteoporosis     She has past surgical history that includes Rhinoplasty; Tonsillectomy; and Sinus endo w/fusion.   Her family history includes Diabetes in her father, paternal aunt, and paternal grandmother; Hypertension in her father and paternal uncle; Thyroid disease in her mother.She reports that she has never smoked. She has never used smokeless tobacco. She reports that she does not drink alcohol or use illicit drugs.  Current Outpatient Prescriptions on File Prior to Visit  Medication Sig Dispense Refill  . alendronate (FOSAMAX) 70 MG tablet Take 1 tablet (70 mg total) by mouth every 7 (seven) days. Take with a full glass of water on an empty stomach. 12 tablet 3  . ALPRAZolam (XANAX) 0.5 MG tablet Take 1 mg by mouth at bedtime as needed for sleep.     . ARIPiprazole (ABILIFY) 5 MG tablet Take 2.5 mg by mouth daily.      . calcium citrate-vitamin D (CITRACAL+D) 315-200  MG-UNIT per tablet Take 2 tablets by mouth daily.    . cholecalciferol (VITAMIN D) 1000 UNITS tablet Take 1,000 Units by mouth daily.    . Cranberry 1000 MG CAPS Take 1 capsule by mouth every evening.     Marland Kitchen ibuprofen (ADVIL,MOTRIN) 200 MG tablet Take 400 mg by mouth every 6 (six) hours as needed for moderate pain.    Marland Kitchen PARoxetine (PAXIL) 20 MG tablet Take 80 mg by mouth at bedtime.     . potassium chloride SA (K-DUR,KLOR-CON) 20 MEQ tablet TAKE 1 TABLET (20 MEQ) BY MOUTH DAILY. 90 tablet 1  . topiramate (TOPAMAX) 100 MG tablet Take 100 mg by mouth at bedtime.     . traZODone (DESYREL) 25 mg TABS Take 25-50 mg by mouth at bedtime as needed for sleep.     Marland Kitchen zolpidem (AMBIEN) 5 MG tablet Take 5 mg by mouth at bedtime as needed for sleep.     No current facility-administered medications on file prior to visit.     Objective:  Objective Physical Exam  Constitutional: She is oriented to person, place, and time. She appears well-developed and well-nourished.  HENT:  Head: Normocephalic and atraumatic.  Eyes: Conjunctivae and EOM are normal.  Neck: Normal range of motion. Neck supple. No JVD present. Carotid bruit is not present. No thyromegaly present.  Cardiovascular: Normal rate, regular rhythm and normal heart sounds.   No murmur heard. Pulmonary/Chest: Effort normal and breath sounds normal. No respiratory distress. She has no wheezes. She has no rales. She exhibits no tenderness.  Musculoskeletal: She exhibits  no edema.  Neurological: She is alert and oriented to person, place, and time.  Psychiatric: She has a normal mood and affect. Her behavior is normal.   BP 114/62 mmHg  Pulse 93  Temp(Src) 98.8 F (37.1 C) (Oral)  Ht 5\' 4"  (1.626 m)  Wt 178 lb 12.8 oz (81.103 kg)  BMI 30.68 kg/m2  SpO2 99% Wt Readings from Last 3 Encounters:  03/17/15 178 lb 12.8 oz (81.103 kg)  06/21/14 171 lb 9.6 oz (77.837 kg)  02/19/14 170 lb (77.111 kg)     Lab Results  Component Value Date   WBC  5.6 02/19/2014   HGB 14.3 02/19/2014   HCT 43.9 02/19/2014   PLT 203.0 02/19/2014   GLUCOSE 91 02/19/2014   CHOL 185 02/19/2014   TRIG 100.0 02/19/2014   HDL 45.80 02/19/2014   LDLDIRECT 130.6 09/05/2009   LDLCALC 119* 02/19/2014   ALT 22 02/19/2014   AST 22 02/19/2014   NA 138 02/19/2014   K 3.5 02/19/2014   CL 109 02/19/2014   CREATININE 1.0 02/19/2014   BUN 18 02/19/2014   CO2 21 02/19/2014   TSH 1.633 06/19/2014   HGBA1C 5.5 03/05/2013   MICROALBUR 0.6 03/05/2013    Dg Lumbar Spine Complete  11/04/2014  CLINICAL DATA:  Slipped on water while at work, landing on back. Mid to lower back pain. Initial encounter. EXAM: LUMBAR SPINE - COMPLETE 4+ VIEW COMPARISON:  MRI of the lumbar spine performed 12/11/2013, and lumbar spine radiographs performed 03/28/2014 FINDINGS: There is no evidence of acute fracture or subluxation. There is slight chronic loss of height at vertebral body L1, stable from prior studies. Vertebral bodies demonstrate normal alignment. Intervertebral disc spaces are preserved. The visualized neural foramina are grossly unremarkable in appearance. The visualized bowel gas pattern is unremarkable in appearance; a large amount of stool is noted along the ascending colon. The sacroiliac joints are within normal limits. IMPRESSION: No evidence of acute fracture or subluxation along the lumbar spine. Electronically Signed   By: Garald Balding M.D.   On: 11/04/2014 18:08     Assessment & Plan:  Plan I have discontinued [name]'s amoxicillin-clavulanate, fluticasone, loratadine, and naproxen. I am also having her start on SAXENDA. Additionally, I am having her maintain her topiramate, ALPRAZolam, PARoxetine, ARIPiprazole, traZODone, zolpidem, potassium chloride SA, calcium citrate-vitamin D, cholecalciferol, Cranberry, ibuprofen, and alendronate.  Meds ordered this encounter  Medications  . SAXENDA 18 MG/3ML SOPN    Sig: Inject 3 mg into the skin daily.    Dispense:  3  mL    Refill:  5    Problem List Items Addressed This Visit    Morbid obesity ([place]) - Primary    saxenda 0.6 mg qd for 1 week and inc by  0.6 mg weekly to max 3.0 mg Discussed diet and exercise Recheck 1 month      Relevant Medications   SAXENDA 18 MG/3ML SOPN      Follow-up: Return in about 3 months (around 06/17/2015), or if symptoms worsen or fail to improve, for weight check.  Garnet Koyanagi, DO

## 2015-03-18 ENCOUNTER — Telehealth: Payer: Self-pay | Admitting: *Deleted

## 2015-03-18 NOTE — Telephone Encounter (Signed)
PA initiated. Awaiting determination. JG//CMA 

## 2015-03-20 ENCOUNTER — Ambulatory Visit (INDEPENDENT_AMBULATORY_CARE_PROVIDER_SITE_OTHER): Payer: 59 | Admitting: Family Medicine

## 2015-03-20 VITALS — BP 118/72 | HR 111 | Temp 98.6°F | Resp 18 | Ht 65.0 in | Wt 176.0 lb

## 2015-03-20 DIAGNOSIS — G479 Sleep disorder, unspecified: Secondary | ICD-10-CM | POA: Diagnosis not present

## 2015-03-20 DIAGNOSIS — M545 Low back pain: Secondary | ICD-10-CM

## 2015-03-20 DIAGNOSIS — R350 Frequency of micturition: Secondary | ICD-10-CM

## 2015-03-20 LAB — POCT URINALYSIS DIP (MANUAL ENTRY)
Bilirubin, UA: NEGATIVE
Blood, UA: NEGATIVE
Glucose, UA: NEGATIVE
Ketones, POC UA: NEGATIVE
LEUKOCYTES UA: NEGATIVE
NITRITE UA: NEGATIVE
PROTEIN UA: NEGATIVE
Spec Grav, UA: 1.015
UROBILINOGEN UA: 0.2
pH, UA: 7

## 2015-03-20 LAB — POC MICROSCOPIC URINALYSIS (UMFC): Mucus: ABSENT

## 2015-03-20 NOTE — Progress Notes (Signed)
Patient ID: [name], female    DOB: 06/29/1970  Age: 44 y.o. MRN: [id number]  Chief Complaint  Patient presents with  . Back Pain    mid to low and rlq x3 days now   . Urinary Frequency    pressure   . Insomnia    Subjective:   Patient had an occult L1 fracture a couple of years ago. She does have some osteoporosis. She's been having back pain. However lately she's been having some urinary frequency and dysuria. She has had urinary tract infections in the past. 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 No known trauma.. She does have trouble sleeping  Current allergies, medications, problem list, past/family and social histories reviewed.  Objective:  BP 118/72 mmHg  Pulse 111  Temp(Src) 98.6 F (37 C) (Oral)  Resp 18  Ht 5\' 5"  (1.651 m)  Wt 176 lb (79.833 kg)  BMI 29.29 kg/m2  SpO2 98%  LMP 03/08/2015  Mild CVA tenderness left more than right. Tender along the upper lumbar spine. Abdomen soft without masses but has a little fullness and mild tenderness.  Results for orders placed or performed in visit on 03/20/15  POCT Microscopic Urinalysis ([facility])  Result Value Ref Range   WBC,UR,HPF,POC None None WBC/hpf   RBC,UR,HPF,POC None None RBC/hpf   Bacteria None None, Too numerous to count   Mucus Absent Absent   Epithelial Cells, UR Per Microscopy None None, Too numerous to count cells/hpf  POCT urinalysis dipstick  Result Value Ref Range   Color, UA yellow yellow   Clarity, UA clear clear   Glucose, UA negative negative   Bilirubin, UA negative negative   Ketones, POC UA negative negative   Spec Grav, UA 1.015    Blood, UA negative negative   pH, UA 7.0    Protein Ur, POC negative negative   Urobilinogen, UA 0.2    Nitrite, UA Negative Negative   Leukocytes, UA Negative Negative    Assessment & Plan:   Assessment: 1. Low back pain without sciatica, unspecified back pain laterality   2.  Frequent urination   3. Sleep disturbance       Plan:   Orders Placed This Encounter  Procedures  . POCT Microscopic Urinalysis ([facility])  . POCT urinalysis dipstick         Patient Instructions  Take the naproxen twice daily  Take the methocarbamol (Robaxin) 500 mg 3 times daily with meals and 2 at bedtime for muscle relaxant  Use the tramadol if needed for additional pain relief  Rest  Ice or heat or alternate  Avoid heavy lifting or straining  If not improving next week see Dr. Rolena Infante back     No Follow-up on file.   HOPPER,DAVID, MD 03/20/2015

## 2015-03-20 NOTE — Patient Instructions (Signed)
Take the naproxen twice daily  Take the methocarbamol (Robaxin) 500 mg 3 times daily with meals and 2 at bedtime for muscle relaxant  Use the tramadol if needed for additional pain relief  Rest  Ice or heat or alternate  Avoid heavy lifting or straining  If not improving next week see Dr. Rolena Infante back

## 2015-05-21 ENCOUNTER — Other Ambulatory Visit: Payer: Self-pay | Admitting: Physician Assistant

## 2015-05-21 MED FILL — SAXENDA 18 MG/3 ML PEN: 18 | 30 days supply | Qty: 15 | Fill #0

## 2015-05-21 MED FILL — ALENDRONATE NA 70 MG TAB: 70 | 84 days supply | Qty: 12 | Fill #1

## 2015-05-21 NOTE — Telephone Encounter (Signed)
Will defer further refills of patient's medications to PCP  

## 2015-05-22 MED FILL — POTASSIUM CL ER 20 MEQ TABL: 20 | 90 days supply | Qty: 90 | Fill #0

## 2015-05-22 MED FILL — ALPRAZolam XR 0.5 MG TB24: 0.5 | 90 days supply | Qty: 270 | Fill #0

## 2015-05-22 MED FILL — ARIPiprazole 5 MG TABS: 5 | 90 days supply | Qty: 90 | Fill #0

## 2015-05-22 NOTE — Telephone Encounter (Signed)
Rx faxed.    KP 

## 2015-06-05 MED FILL — PARoxetine HCL 40 MG TABS: 40 | 90 days supply | Qty: 180 | Fill #1

## 2015-06-06 DIAGNOSIS — M545 Low back pain: Secondary | ICD-10-CM | POA: Diagnosis not present

## 2015-06-25 MED FILL — TOPIRAMATE 100 MG TABLET: 100 | 90 days supply | Qty: 90 | Fill #0

## 2015-06-25 MED FILL — SAXENDA 18 MG/3 ML PEN: 18 | 30 days supply | Qty: 15 | Fill #1

## 2015-06-30 ENCOUNTER — Other Ambulatory Visit: Payer: Self-pay | Admitting: Family Medicine

## 2015-06-30 MED FILL — ULTICARE PEN NDL 8MM 31G: 31G X 8 MM | 90 days supply | Qty: 100 | Fill #0

## 2015-07-18 DIAGNOSIS — F411 Generalized anxiety disorder: Secondary | ICD-10-CM | POA: Diagnosis not present

## 2015-07-30 ENCOUNTER — Encounter: Payer: Self-pay | Admitting: Physician Assistant

## 2015-07-30 ENCOUNTER — Ambulatory Visit (INDEPENDENT_AMBULATORY_CARE_PROVIDER_SITE_OTHER): Payer: 59 | Admitting: Physician Assistant

## 2015-07-30 VITALS — BP 90/58 | HR 106 | Temp 98.3°F | Ht 65.0 in | Wt 159.4 lb

## 2015-07-30 DIAGNOSIS — J019 Acute sinusitis, unspecified: Secondary | ICD-10-CM | POA: Insufficient documentation

## 2015-07-30 DIAGNOSIS — B9689 Other specified bacterial agents as the cause of diseases classified elsewhere: Secondary | ICD-10-CM | POA: Insufficient documentation

## 2015-07-30 MED ORDER — AMOXICILLIN-POT CLAVULANATE 875-125 MG PO TABS: 1.0000 | ORAL_TABLET | Freq: Two times a day (BID) | ORAL | Status: DC

## 2015-07-30 MED FILL — AMOX-CLAV 875-125 MG TABLET: 875-125 | 7 days supply | Qty: 14 | Fill #0

## 2015-07-30 NOTE — Progress Notes (Signed)
Pre visit review using our clinic review tool, if applicable. No additional management support is needed unless otherwise documented below in the visit note. 

## 2015-07-30 NOTE — Progress Notes (Signed)
Patient presents to clinic today c/o 5 days of worsening sinus pressure and sinus pain with thick yellow drainage, now with facial pain and ear pressure. Denies fever, chills or chest congestion. Only mild, occasional cough. Denies SOB or chest pain. Has taken Dayquil with some relief of symptoms.   Past Medical History  Diagnosis Date  . Depression   . Incontinence   . Hyperglycemia 03/05/2013  . Anxiety   . Osteoporosis   . Lumbar stress fracture     Current Outpatient Prescriptions on File Prior to Visit  Medication Sig Dispense Refill  . alendronate (FOSAMAX) 70 MG tablet Take 1 tablet (70 mg total) by mouth every 7 (seven) days. Take with a full glass of water on an empty stomach. 12 tablet 3  . ALPRAZolam (XANAX) 0.5 MG tablet Take 1 mg by mouth at bedtime as needed for sleep.     . ARIPiprazole (ABILIFY) 5 MG tablet Take 2.5 mg by mouth daily.      . calcium citrate-vitamin D (CITRACAL+D) 315-200 MG-UNIT per tablet Take 2 tablets by mouth daily.    . cholecalciferol (VITAMIN D) 1000 UNITS tablet Take 1,000 Units by mouth daily.    . Cranberry 1000 MG CAPS Take 1 capsule by mouth every evening.     Marland Kitchen ibuprofen (ADVIL,MOTRIN) 200 MG tablet Take 400 mg by mouth every 6 (six) hours as needed for moderate pain.    Marland Kitchen PARoxetine (PAXIL) 20 MG tablet Take 80 mg by mouth at bedtime.     . potassium chloride SA (K-DUR,KLOR-CON) 20 MEQ tablet TAKE 1 TABLET (20 MEQ) BY MOUTH DAILY. 90 tablet 1  . SAXENDA 18 MG/3ML SOPN Inject 3 mg into the skin daily. 3 mL 5  . topiramate (TOPAMAX) 100 MG tablet Take 100 mg by mouth at bedtime.     . traZODone (DESYREL) 25 mg TABS Take 25-50 mg by mouth at bedtime as needed for sleep.     Marland Kitchen ULTICARE SHORT PEN NEEDLES 31G X 8 MM MISC USE AS DIRECTED TO INJECT SAXENDA 100 each 0  . zolpidem (AMBIEN) 5 MG tablet Take 5 mg by mouth at bedtime as needed for sleep.     No current facility-administered medications on file prior to visit.    No Known  Allergies  Family History  Problem Relation Age of Onset  . Thyroid disease Mother   . Hyperlipidemia Mother   . Diabetes Father   . Hypertension Father   . Hyperlipidemia Father   . Hypertension Paternal Uncle   . Diabetes Paternal Grandmother   . Hypertension Paternal Grandmother   . Hyperlipidemia Paternal Grandmother   . Diabetes Paternal Aunt   . Mental illness Sister     Social History   Social History  . Marital Status: Married    Spouse Name: N/A  . Number of Children: N/A  . Years of Education: 16   Occupational History  . RN/OR [employer]   Social History Main Topics  . Smoking status: Never Smoker   . Smokeless tobacco: Never Used  . Alcohol Use: No  . Drug Use: No  . Sexual Activity:    Partners: Male   Other Topics Concern  . None   Social History Narrative    Review of Systems - See HPI.  All other ROS are negative.  BP 90/58 mmHg  Pulse 106  Temp(Src) 98.3 F (36.8 C) (Oral)  Ht 5\' 5"  (1.651 m)  Wt 159 lb 6.4 oz (72.303 kg)  BMI 26.53 kg/m2  SpO2 99%  LMP 07/29/2015  Physical Exam  Constitutional: She is oriented to person, place, and time and well-developed, well-nourished, and in no distress.  HENT:  Head: Normocephalic and atraumatic.  Right Ear: Tympanic membrane normal.  Left Ear: Tympanic membrane normal.  Nose: Mucosal edema and rhinorrhea present. Right sinus exhibits maxillary sinus tenderness. Left sinus exhibits maxillary sinus tenderness.  Mouth/Throat: Uvula is midline, oropharynx is clear and moist and mucous membranes are normal.  Eyes: Conjunctivae are normal.  Neck: Neck supple.  Cardiovascular: Normal rate, regular rhythm, normal heart sounds and intact distal pulses.   Pulmonary/Chest: Effort normal and breath sounds normal. No respiratory distress. She has no wheezes. She has no rales. She exhibits no tenderness.  Lymphadenopathy:    She has no cervical adenopathy.  Neurological: She is alert and oriented to  person, place, and time.  Skin: Skin is warm and dry. No rash noted.  Psychiatric: Affect normal.  Vitals reviewed.   No results found for this or any previous visit (from the past 2160 hour(s)).  Assessment/Plan: Acute bacterial sinusitis Rx Augmentin.  Increase fluids.  Rest.  Saline nasal spray.  Probiotic.  Mucinex as directed.  Humidifier in bedroom. Delsym for cough.  Call or return to clinic if symptoms are not improving.

## 2015-07-30 NOTE — Assessment & Plan Note (Signed)
Rx Augmentin.  Increase fluids.  Rest.  Saline nasal spray.  Probiotic.  Mucinex as directed.  Humidifier in bedroom. Delsym for cough.  Call or return to clinic if symptoms are not improving.  

## 2015-07-30 NOTE — Patient Instructions (Signed)
Please take antibiotic as directed.  Increase fluid intake.  Use Saline nasal spray.  Take a daily multivitamin. Delsym for cough.  Place a humidifier in the bedroom.  Please call or return clinic if symptoms are not improving.  Sinusitis Sinusitis is redness, soreness, and swelling (inflammation) of the paranasal sinuses. Paranasal sinuses are air pockets within the bones of your face (beneath the eyes, the middle of the forehead, or above the eyes). In healthy paranasal sinuses, mucus is able to drain out, and air is able to circulate through them by way of your nose. However, when your paranasal sinuses are inflamed, mucus and air can become trapped. This can allow bacteria and other germs to grow and cause infection. Sinusitis can develop quickly and last only a short time (acute) or continue over a long period (chronic). Sinusitis that lasts for more than 12 weeks is considered chronic.  CAUSES  Causes of sinusitis include:  Allergies.  Structural abnormalities, such as displacement of the cartilage that separates your nostrils (deviated septum), which can decrease the air flow through your nose and sinuses and affect sinus drainage.  Functional abnormalities, such as when the small hairs (cilia) that line your sinuses and help remove mucus do not work properly or are not present. SYMPTOMS  Symptoms of acute and chronic sinusitis are the same. The primary symptoms are pain and pressure around the affected sinuses. Other symptoms include:  Upper toothache.  Earache.  Headache.  Bad breath.  Decreased sense of smell and taste.  A cough, which worsens when you are lying flat.  Fatigue.  Fever.  Thick drainage from your nose, which often is green and may contain pus (purulent).  Swelling and warmth over the affected sinuses. DIAGNOSIS  Your caregiver will perform a physical exam. During the exam, your caregiver may:  Look in your nose for signs of abnormal growths in your  nostrils (nasal polyps).  Tap over the affected sinus to check for signs of infection.  View the inside of your sinuses (endoscopy) with a special imaging device with a light attached (endoscope), which is inserted into your sinuses. If your caregiver suspects that you have chronic sinusitis, one or more of the following tests may be recommended:  Allergy tests.  Nasal culture A sample of mucus is taken from your nose and sent to a lab and screened for bacteria.  Nasal cytology A sample of mucus is taken from your nose and examined by your caregiver to determine if your sinusitis is related to an allergy. TREATMENT  Most cases of acute sinusitis are related to a viral infection and will resolve on their own within 10 days. Sometimes medicines are prescribed to help relieve symptoms (pain medicine, decongestants, nasal steroid sprays, or saline sprays).  However, for sinusitis related to a bacterial infection, your caregiver will prescribe antibiotic medicines. These are medicines that will help kill the bacteria causing the infection.  Rarely, sinusitis is caused by a fungal infection. In theses cases, your caregiver will prescribe antifungal medicine. For some cases of chronic sinusitis, surgery is needed. Generally, these are cases in which sinusitis recurs more than 3 times per year, despite other treatments. HOME CARE INSTRUCTIONS   Drink plenty of water. Water helps thin the mucus so your sinuses can drain more easily.  Use a humidifier.  Inhale steam 3 to 4 times a day (for example, sit in the bathroom with the shower running).  Apply a warm, moist washcloth to your face 3 to 4   times a day, or as directed by your caregiver.  Use saline nasal sprays to help moisten and clean your sinuses.  Take over-the-counter or prescription medicines for pain, discomfort, or fever only as directed by your caregiver. SEEK IMMEDIATE MEDICAL CARE IF:  You have increasing pain or severe  headaches.  You have nausea, vomiting, or drowsiness.  You have swelling around your face.  You have vision problems.  You have a stiff neck.  You have difficulty breathing. MAKE SURE YOU:   Understand these instructions.  Will watch your condition.  Will get help right away if you are not doing well or get worse. Document Released: 04/19/2005 Document Revised: 07/12/2011 Document Reviewed: 05/04/2011 [employer] Patient Information 2014 [employer].   

## 2015-08-03 ENCOUNTER — Ambulatory Visit (INDEPENDENT_AMBULATORY_CARE_PROVIDER_SITE_OTHER): Payer: 59 | Admitting: Emergency Medicine

## 2015-08-03 ENCOUNTER — Ambulatory Visit (INDEPENDENT_AMBULATORY_CARE_PROVIDER_SITE_OTHER): Payer: 59

## 2015-08-03 VITALS — BP 98/68 | HR 99 | Temp 98.0°F | Resp 16 | Ht 65.4 in | Wt 157.4 lb

## 2015-08-03 DIAGNOSIS — J209 Acute bronchitis, unspecified: Secondary | ICD-10-CM

## 2015-08-03 DIAGNOSIS — R059 Cough, unspecified: Secondary | ICD-10-CM

## 2015-08-03 DIAGNOSIS — R05 Cough: Secondary | ICD-10-CM

## 2015-08-03 LAB — POCT CBC
GRANULOCYTE PERCENT: 58.9 % (ref 37–80)
HCT, POC: 42.2 % (ref 37.7–47.9)
HEMOGLOBIN: 14.9 g/dL (ref 12.2–16.2)
Lymph, poc: 1.4 (ref 0.6–3.4)
MCH, POC: 32.4 pg — AB (ref 27–31.2)
MCHC: 35.3 g/dL (ref 31.8–35.4)
MCV: 91.8 fL (ref 80–97)
MID (cbc): 0.3 (ref 0–0.9)
MPV: 6.7 fL (ref 0–99.8)
POC Granulocyte: 2.5 (ref 2–6.9)
POC LYMPH %: 33.8 % (ref 10–50)
POC MID %: 7.3 % (ref 0–12)
Platelet Count, POC: 263 10*3/uL (ref 142–424)
RBC: 4.6 M/uL (ref 4.04–5.48)
RDW, POC: 12.7 %
WBC: 4.2 10*3/uL — AB (ref 4.6–10.2)

## 2015-08-03 LAB — POCT INFLUENZA A/B
INFLUENZA A, POC: NEGATIVE
Influenza B, POC: NEGATIVE

## 2015-08-03 MED ORDER — BENZONATATE 100 MG PO CAPS: 100.0000 mg | ORAL_CAPSULE | Freq: Three times a day (TID) | ORAL | Status: DC | PRN

## 2015-08-03 MED ORDER — AZITHROMYCIN 250 MG PO TABS: ORAL_TABLET | ORAL | Status: DC

## 2015-08-03 NOTE — Progress Notes (Addendum)
Patient ID: Brooke [name], female   DOB: 20-Jul-1970, 45 y.o.   MRN: [id number]     By signing my name below, I, Zola Button, attest that this documentation has been prepared under the direction and in the presence of Arlyss Queen, MD.  Electronically Signed: Zola Button, Medical Scribe. 08/03/2015. 11:24 AM.   Chief Complaint:  Chief Complaint  Patient presents with  . Sinusitis    was treated at [place] but no better.      HPI: Brooke [name] is a 45 y.o. female who reports to [facility] today for a follow-up for sinusitis. Patient was seen by Raiford Noble, PA-C at [facility] 4 days ago for symptoms that began 5 days prior. She was diagnosed with acute bacterial sinusitis and was treated with Augmentin. Since then, patient states her symptoms have not improved. Her symptoms include sinus pressure, sinus drainage, productive cough with yellow sputum, and low-grade fevers of 99-100 F. She had generalized myalgias 2 days ago. She also feels her throat has become more hoarse. Patient denies smoking and possibility of pregnancy. Her daughter became ill around the same time, but her daughter has since recovered.  Past Medical History  Diagnosis Date  . Depression   . Incontinence   . Hyperglycemia 03/05/2013  . Anxiety   . Osteoporosis   . Lumbar stress fracture    Past Surgical History  Procedure Laterality Date  . Rhinoplasty    . Tonsillectomy    . Sinus endo w/fusion     Social History   Social History  . Marital Status: Married    Spouse Name: N/A  . Number of Children: N/A  . Years of Education: 16   Occupational History  . RN/OR [employer]   Social History Main Topics  . Smoking status: Never Smoker   . Smokeless tobacco: Never Used  . Alcohol Use: No  . Drug Use: No  . Sexual Activity:    Partners: Male   Other Topics Concern  . None   Social History Narrative   Family History  Problem Relation Age of Onset  . Thyroid disease Mother   .  Hyperlipidemia Mother   . Diabetes Father   . Hypertension Father   . Hyperlipidemia Father   . Hypertension Paternal Uncle   . Diabetes Paternal Grandmother   . Hypertension Paternal Grandmother   . Hyperlipidemia Paternal Grandmother   . Diabetes Paternal Aunt   . Mental illness Sister    No Known Allergies Prior to Admission medications   Medication Sig Start Date End Date Taking? Authorizing Provider  alendronate (FOSAMAX) 70 MG tablet Take 1 tablet (70 mg total) by mouth every 7 (seven) days. Take with a full glass of water on an empty stomach. 06/21/14  Yes Yvonne R Lowne Chase, DO  ALPRAZolam Duanne Moron) 0.5 MG tablet Take 1 mg by mouth at bedtime as needed for sleep.    Yes Historical Provider, MD  amoxicillin-clavulanate (AUGMENTIN) 875-125 MG tablet Take 1 tablet by mouth 2 (two) times daily. 07/30/15  Yes Brunetta Jeans, PA-C  ARIPiprazole (ABILIFY) 5 MG tablet Take 2.5 mg by mouth daily.     Yes Historical Provider, MD  calcium citrate-vitamin D (CITRACAL+D) 315-200 MG-UNIT per tablet Take 2 tablets by mouth daily.   Yes Historical Provider, MD  cholecalciferol (VITAMIN D) 1000 UNITS tablet Take 1,000 Units by mouth daily.   Yes Historical Provider, MD  Cranberry 1000 MG CAPS Take 1 capsule by mouth every evening.  Yes Historical Provider, MD  ibuprofen (ADVIL,MOTRIN) 200 MG tablet Take 400 mg by mouth every 6 (six) hours as needed for moderate pain.   Yes Historical Provider, MD  PARoxetine (PAXIL) 20 MG tablet Take 80 mg by mouth at bedtime.    Yes Historical Provider, MD  potassium chloride SA (K-DUR,KLOR-CON) 20 MEQ tablet TAKE 1 TABLET (20 MEQ) BY MOUTH DAILY. 05/22/15  Yes Yvonne R Lowne Chase, DO  SAXENDA 18 MG/3ML SOPN Inject 3 mg into the skin daily. 03/17/15  Yes Yvonne R Lowne Chase, DO  topiramate (TOPAMAX) 100 MG tablet Take 100 mg by mouth at bedtime.    Yes Historical Provider, MD  traZODone (DESYREL) 25 mg TABS Take 25-50 mg by mouth at bedtime as needed for sleep.     Yes Historical Provider, MD  ULTICARE SHORT PEN NEEDLES 31G X 8 MM MISC USE AS DIRECTED TO INJECT SAXENDA 06/30/15  Yes Yvonne R Lowne Chase, DO  zolpidem (AMBIEN) 5 MG tablet Take 5 mg by mouth at bedtime as needed for sleep.   Yes Historical Provider, MD     ROS: The patient denies chills, night sweats, unintentional weight loss, chest pain, palpitations, wheezing, dyspnea on exertion, nausea, vomiting, abdominal pain, dysuria, hematuria, melena, numbness, weakness, or tingling.   All other systems have been reviewed and were otherwise negative with the exception of those mentioned in the HPI and as above.    PHYSICAL EXAM: Filed Vitals:   08/03/15 1102  BP: 98/68  Pulse: 99  Temp: 98 F (36.7 C)  Resp: 16   Body mass index is 25.88 kg/(m^2).   General: Alert, no acute distress HEENT:  Normocephalic, atraumatic, oropharynx patent. Eye: Juliette Mangle [facility] Cardiovascular:  Regular rate and rhythm, no rubs murmurs or gallops.  No Carotid bruits, radial pulse intact. No pedal edema.  Respiratory: Clear to auscultation bilaterally.  No wheezes, rales, or rhonchi.  No cyanosis, no use of accessory musculature Abdominal: No organomegaly, abdomen is soft and non-tender, positive bowel sounds.  No masses. Musculoskeletal: Gait intact. No edema, tenderness Skin: No rashes. Neurologic: Facial musculature symmetric. Psychiatric: Patient acts appropriately throughout our interaction. Lymphatic: No cervical or submandibular lymphadenopathy    LABS: Results for orders placed or performed in visit on 08/03/15  POCT CBC  Result Value Ref Range   WBC 4.2 (A) 4.6 - 10.2 K/uL   Lymph, poc 1.4 0.6 - 3.4   POC LYMPH PERCENT 33.8 10 - 50 %L   MID (cbc) 0.3 0 - 0.9   POC MID % 7.3 0 - 12 %M   POC Granulocyte 2.5 2 - 6.9   Granulocyte percent 58.9 37 - 80 %G   RBC 4.60 4.04 - 5.48 M/uL   Hemoglobin 14.9 12.2 - 16.2 g/dL   HCT, POC 42.2 37.7 - 47.9 %   MCV 91.8 80 - 97 fL   MCH, POC 32.4 (A)  27 - 31.2 pg   MCHC 35.3 31.8 - 35.4 g/dL   RDW, POC 12.7 %   Platelet Count, POC 263 142 - 424 K/uL   MPV 6.7 0 - 99.8 fL  POCT Influenza A/B  Result Value Ref Range   Influenza A, POC Negative Negative   Influenza B, POC Negative Negative     EKG/XRAY:   Primary read interpreted by Dr. Everlene Farrier at [facility]  Dg Chest 2 View  08/03/2015  CLINICAL DATA:  Cough, congestion invasive throughout. EXAM: CHEST  2 VIEW COMPARISON:  None. FINDINGS: The heart size and mediastinal  contours are within normal limits. Both lungs are clear. The visualized skeletal structures are unremarkable. IMPRESSION: Lungs are clear and there is no evidence of acute cardiopulmonary abnormality. Electronically Signed   By: Franki Cabot M.D.   On: 08/03/2015 11:59    ASSESSMENT/PLAN: I personally performed the services described in this documentation, which was scribed in my presence. The recorded information has been reviewed and is accurate.lu test negative chest x-ray no acute disease white count suspicious for viral type illness with slightly low platelet count. Will treat symptomatically with Tessalon Perles if no better in 48 hours she can get a Z-Pak prescription filled.    Gross sideeffects, risk and benefits, and alternatives of medications d/w patient. Patient is aware that all medications have potential sideeffects and we are unable to predict every sideeffect or drug-drug interaction that may occur. She will stop her Augmentin.  Arlyss Queen MD 08/03/2015 11:24 AM

## 2015-08-04 DIAGNOSIS — H5213 Myopia, bilateral: Secondary | ICD-10-CM | POA: Diagnosis not present

## 2015-08-10 ENCOUNTER — Ambulatory Visit (HOSPITAL_COMMUNITY)
Admission: EM | Admit: 2015-08-10 | Discharge: 2015-08-10 | Disposition: A | Discharge status: Home / Self Care | Payer: 59 | Attending: Family Medicine | Admitting: Family Medicine

## 2015-08-10 ENCOUNTER — Encounter (HOSPITAL_COMMUNITY): Payer: Self-pay | Admitting: *Deleted

## 2015-08-10 DIAGNOSIS — N390 Urinary tract infection, site not specified: Secondary | ICD-10-CM

## 2015-08-10 DIAGNOSIS — R319 Hematuria, unspecified: Secondary | ICD-10-CM | POA: Insufficient documentation

## 2015-08-10 LAB — POCT URINALYSIS DIP (DEVICE)
BILIRUBIN URINE: NEGATIVE
Glucose, UA: NEGATIVE mg/dL
Ketones, ur: NEGATIVE mg/dL
NITRITE: NEGATIVE
PH: 6.5 (ref 5.0–8.0)
PROTEIN: NEGATIVE mg/dL
Specific Gravity, Urine: 1.015 (ref 1.005–1.030)
Urobilinogen, UA: 0.2 mg/dL (ref 0.0–1.0)

## 2015-08-10 LAB — POCT PREGNANCY, URINE: PREG TEST UR: NEGATIVE

## 2015-08-10 MED ORDER — CEPHALEXIN 500 MG PO CAPS: 500.0000 mg | ORAL_CAPSULE | Freq: Four times a day (QID) | ORAL | Status: DC

## 2015-08-10 NOTE — Discharge Instructions (Signed)
Take all of medicine as directed, drink lots of fluids, see your doctor if further problems. °

## 2015-08-10 NOTE — ED Notes (Signed)
Started with a few incontinent (urine) episodes 1 wk ago; started with bladder pressure, hematuria (noticed intermittently when wiping) x 3 days.  Denies any fevers or flank pain.

## 2015-08-10 NOTE — ED Provider Notes (Signed)
CSN: [id number]     Arrival date & time 08/10/15  1259 History   None    No chief complaint on file.  (Consider location/radiation/quality/duration/timing/severity/associated sxs/prior Treatment) Patient is a 45 y.o. female presenting with hematuria. The history is provided by the patient.  Hematuria This is a new problem. The current episode started more than 2 days ago. The problem has been gradually worsening. Associated symptoms include abdominal pain. Pertinent negatives include no chest pain, no headaches and no shortness of breath.    Past Medical History  Diagnosis Date  . Depression   . Incontinence   . Hyperglycemia 03/05/2013  . Anxiety   . Osteoporosis   . Lumbar stress fracture    Past Surgical History  Procedure Laterality Date  . Rhinoplasty    . Tonsillectomy    . Sinus endo w/fusion     Family History  Problem Relation Age of Onset  . Thyroid disease Mother   . Hyperlipidemia Mother   . Diabetes Father   . Hypertension Father   . Hyperlipidemia Father   . Hypertension Paternal Uncle   . Diabetes Paternal Grandmother   . Hypertension Paternal Grandmother   . Hyperlipidemia Paternal Grandmother   . Diabetes Paternal Aunt   . Mental illness Sister    Social History  Substance Use Topics  . Smoking status: Never Smoker   . Smokeless tobacco: Never Used  . Alcohol Use: No   OB History    No data available     Review of Systems  Constitutional: Negative.   Respiratory: Negative for shortness of breath.   Cardiovascular: Negative for chest pain.  Gastrointestinal: Positive for abdominal pain. Negative for nausea and vomiting.  Genitourinary: Positive for dysuria, urgency, frequency and hematuria. Negative for flank pain.  Neurological: Negative for headaches.  All other systems reviewed and are negative.   Allergies  Review of patient's allergies indicates no known allergies.  Home Medications   Prior to Admission medications   Medication Sig  Start Date End Date Taking? Authorizing Provider  alendronate (FOSAMAX) 70 MG tablet Take 1 tablet (70 mg total) by mouth every 7 (seven) days. Take with a full glass of water on an empty stomach. 06/21/14   Rosalita Chessman Chase, DO  ALPRAZolam Duanne Moron) 0.5 MG tablet Take 1 mg by mouth at bedtime as needed for sleep.     Historical Provider, MD  amoxicillin-clavulanate (AUGMENTIN) 875-125 MG tablet Take 1 tablet by mouth 2 (two) times daily. 07/30/15   Brunetta Jeans, PA-C  ARIPiprazole (ABILIFY) 5 MG tablet Take 2.5 mg by mouth daily.      Historical Provider, MD  azithromycin (ZITHROMAX) 250 MG tablet Take 2 tabs PO x 1 dose, then 1 tab PO QD x 4 days 08/03/15   Darlyne Russian, MD  benzonatate (TESSALON) 100 MG capsule Take 1-2 capsules (100-200 mg total) by mouth 3 (three) times daily as needed for cough. 08/03/15   Darlyne Russian, MD  calcium citrate-vitamin D (CITRACAL+D) 315-200 MG-UNIT per tablet Take 2 tablets by mouth daily.    Historical Provider, MD  cholecalciferol (VITAMIN D) 1000 UNITS tablet Take 1,000 Units by mouth daily.    Historical Provider, MD  Cranberry 1000 MG CAPS Take 1 capsule by mouth every evening.     Historical Provider, MD  ibuprofen (ADVIL,MOTRIN) 200 MG tablet Take 400 mg by mouth every 6 (six) hours as needed for moderate pain.    Historical Provider, MD  PARoxetine (PAXIL) 20 MG  tablet Take 80 mg by mouth at bedtime.     Historical Provider, MD  potassium chloride SA (K-DUR,KLOR-CON) 20 MEQ tablet TAKE 1 TABLET (20 MEQ) BY MOUTH DAILY. 05/22/15   Yvonne R Lowne Chase, DO  SAXENDA 18 MG/3ML SOPN Inject 3 mg into the skin daily. 03/17/15   Rosalita Chessman Chase, DO  topiramate (TOPAMAX) 100 MG tablet Take 100 mg by mouth at bedtime.     Historical Provider, MD  traZODone (DESYREL) 25 mg TABS Take 25-50 mg by mouth at bedtime as needed for sleep.     Historical Provider, MD  ULTICARE SHORT PEN NEEDLES 31G X 8 MM MISC USE AS DIRECTED TO INJECT SAXENDA 06/30/15   Alferd Apa Lowne  Chase, DO  zolpidem (AMBIEN) 5 MG tablet Take 5 mg by mouth at bedtime as needed for sleep.    Historical Provider, MD   Meds Ordered and Administered this Visit  Medications - No data to display  BP 119/71 mmHg  Pulse 104  Temp(Src) 98.8 F (37.1 C) (Oral)  Resp 18  SpO2 98%  LMP 07/29/2015 No data found.   Physical Exam  Constitutional: She is oriented to person, place, and time. She appears well-developed and well-nourished. No distress.  Neck: Normal range of motion. Neck supple.  Cardiovascular: Normal heart sounds.   Pulmonary/Chest: Effort normal and breath sounds normal.  Abdominal: Soft. Bowel sounds are normal. She exhibits no distension and no mass. There is no tenderness. There is no rebound and no guarding.  Lymphadenopathy:    She has no cervical adenopathy.  Neurological: She is alert and oriented to person, place, and time.  Skin: Skin is warm and dry.  Nursing note and vitals reviewed.   ED Course  Procedures (including critical care time)  Labs Review Labs Reviewed  POCT URINALYSIS DIP (DEVICE) - Abnormal; Notable for the following:    Hgb urine dipstick TRACE (*)    Leukocytes, UA MODERATE (*)    All other components within normal limits    Imaging Review No results found.   Visual Acuity Review  Right Eye Distance:   Left Eye Distance:   Bilateral Distance:    Right Eye Near:   Left Eye Near:    Bilateral Near:         MDM  No diagnosis found. Meds ordered this encounter  Medications  . cephALEXin (KEFLEX) 500 MG capsule    Sig: Take 1 capsule (500 mg total) by mouth 4 (four) times daily. Take all of medicine and drink lots of fluids    Dispense:  20 capsule    Refill:  0      Billy Fischer, MD 08/10/15 1326

## 2015-08-11 ENCOUNTER — Telehealth: Payer: Self-pay | Admitting: *Deleted

## 2015-08-11 NOTE — Telephone Encounter (Signed)
PA for Saxenda initiated on [email or web address]. Awaiting determination. JG//CMA

## 2015-08-12 LAB — URINE CULTURE
Culture: >=100000 — AB
SPECIAL REQUESTS: NORMAL

## 2015-08-12 NOTE — Telephone Encounter (Signed)
PA approved for 4 months through 12/11/2015. Approval letter sent for scanning. JG//CMA

## 2015-08-13 MED FILL — SAXENDA 18 MG/3 ML PEN: 18 | 30 days supply | Qty: 15 | Fill #2

## 2015-08-22 ENCOUNTER — Encounter: Payer: Self-pay | Admitting: Internal Medicine

## 2015-08-22 ENCOUNTER — Ambulatory Visit (INDEPENDENT_AMBULATORY_CARE_PROVIDER_SITE_OTHER): Payer: 59 | Admitting: Internal Medicine

## 2015-08-22 VITALS — BP 108/78 | HR 110 | Temp 98.4°F | Resp 16 | Ht 63.5 in | Wt 160.0 lb

## 2015-08-22 DIAGNOSIS — L237 Allergic contact dermatitis due to plants, except food: Secondary | ICD-10-CM | POA: Diagnosis not present

## 2015-08-22 MED ORDER — PREDNISONE 20 MG PO TABS: 40.0000 mg | ORAL_TABLET | Freq: Every day | ORAL | Status: DC

## 2015-08-22 MED ORDER — TRIAMCINOLONE ACETONIDE 0.1 % EX CREA: 1.0000 "application " | TOPICAL_CREAM | Freq: Two times a day (BID) | CUTANEOUS | Status: DC

## 2015-08-22 MED FILL — predniSONE 20 MG TABS: 20 | 4 days supply | Qty: 8 | Fill #0

## 2015-08-22 MED FILL — TRIAMCINOLONE 0.1% CREAM: 0.1 | 10 days supply | Qty: 30 | Fill #0

## 2015-08-22 NOTE — Progress Notes (Signed)
   Subjective:    Patient ID: Brooke [name], female    DOB: Nov 16, 1970, 45 y.o.   MRN: [id number]  HPI The patient is a 45 YO female coming in for poison ivy. She was clearing out her yard last weekend without protective clothing or gear. She did not clean up after the yardwork and was scratching and thinks she spread it around. On her chin, arms, legs. Has not changed since then. Not spreading. Has not tried anything.   Review of Systems  Constitutional: Negative.   Respiratory: Negative.   Cardiovascular: Negative.   Musculoskeletal: Negative.   Skin: Positive for color change and rash.  Neurological: Negative.       Objective:   Physical Exam  Constitutional: She is oriented to person, place, and time. She appears well-developed and well-nourished.  HENT:  Head: Normocephalic and atraumatic.  Cardiovascular: Normal rate and regular rhythm.   Pulmonary/Chest: Effort normal and breath sounds normal.  Abdominal: Soft.  Musculoskeletal: She exhibits no edema.  Neurological: She is alert and oriented to person, place, and time.  Skin: Skin is warm and dry.  Several splotchy areas on her legs and arms, none appear infected.    Filed Vitals:   08/22/15 1431  BP: 108/78  Pulse: 110  Temp: 98.4 F (36.9 C)  TempSrc: Oral  Resp: 16  Height: 5' 3.5" (1.613 m)  Weight: 160 lb (72.576 kg)  SpO2: 98%      Assessment & Plan:

## 2015-08-22 NOTE — Progress Notes (Signed)
Pre visit review using our clinic review tool, if applicable. No additional management support is needed unless otherwise documented below in the visit note. 

## 2015-08-22 NOTE — Assessment & Plan Note (Signed)
Rx for short burst prednisone, triamcinolone cream. Advised that she needs to be careful when clearing yard and with handling of unknown plants. Advised to wear gloves and protective clothing.

## 2015-08-22 NOTE — Patient Instructions (Signed)
We have sent in prednisone for the poison ivy. Take 2 pills daily for 4 days. This should start feeling better in 1-2 days.   We have also sent in a cream called triamcinolone cream that you can use twice daily for the rash to help it heal faster.   Be sure to use protection when clearing yard plants by using long sleeves and pants and washing after any exposure to unknown plants.   Poison [employer] ivy is a inflammation of the skin (contact dermatitis) caused by touching the allergens on the leaves of the ivy plant following previous exposure to the plant. The rash usually appears 48 hours after exposure. The rash is usually bumps (papules) or blisters (vesicles) in a linear pattern. Depending on your own sensitivity, the rash may simply cause redness and itching, or it may also progress to blisters which may break open. These must be well cared for to prevent secondary bacterial (germ) infection, followed by scarring. Keep any open areas dry, clean, dressed, and covered with an antibacterial ointment if needed. The eyes may also get puffy. The puffiness is worst in the morning and gets better as the day progresses. This dermatitis usually heals without scarring, within 2 to 3 weeks without treatment. HOME CARE INSTRUCTIONS  Thoroughly wash with soap and water as soon as you have been exposed to poison ivy. You have about one half hour to remove the plant resin before it will cause the rash. This washing will destroy the oil or antigen on the skin that is causing, or will cause, the rash. Be sure to wash under your fingernails as any plant resin there will continue to spread the rash. Do not rub skin vigorously when washing affected area. Poison ivy cannot spread if no oil from the plant remains on your body. A rash that has progressed to weeping sores will not spread the rash unless you have not washed thoroughly. It is also important to wash any clothes you have been wearing as these may carry active  allergens. The rash will return if you wear the unwashed clothing, even several days later. Avoidance of the plant in the future is the best measure. Poison ivy plant can be recognized by the number of leaves. Generally, poison ivy has three leaves with flowering branches on a single stem. Diphenhydramine may be purchased over the counter and used as needed for itching. Do not drive with this medication if it makes you drowsy.Ask your caregiver about medication for children. SEEK MEDICAL CARE IF:  Open sores develop.  Redness spreads beyond area of rash.  You notice purulent (pus-like) discharge.  You have increased pain.  Other signs of infection develop (such as fever).   This information is not intended to replace advice given to you by your health care provider. Make sure you discuss any questions you have with your health care provider.   Document Released: 04/16/2000 Document Revised: 07/12/2011 Document Reviewed: 09/25/2014 Elsevier Interactive Patient Education [employer].

## 2015-09-18 ENCOUNTER — Other Ambulatory Visit: Payer: Self-pay | Admitting: Orthopedic Surgery

## 2015-09-18 DIAGNOSIS — R5381 Other malaise: Secondary | ICD-10-CM

## 2015-09-22 MED FILL — PARoxetine HCL 40 MG TABS: 40 | 90 days supply | Qty: 180 | Fill #0

## 2015-09-22 MED FILL — ARIPiprazole 5 MG TABS: 5 | 90 days supply | Qty: 90 | Fill #0

## 2015-09-22 MED FILL — ALPRAZolam ER 0.5 MG TB24: 0.5 | 90 days supply | Qty: 270 | Fill #0

## 2015-09-24 ENCOUNTER — Other Ambulatory Visit: Payer: Self-pay | Admitting: Orthopedic Surgery

## 2015-09-24 DIAGNOSIS — M810 Age-related osteoporosis without current pathological fracture: Secondary | ICD-10-CM

## 2015-10-16 MED FILL — SAXENDA 18 MG/3 ML PEN: 18 | 30 days supply | Qty: 15 | Fill #3

## 2015-10-16 MED FILL — TOPIRAMATE 100 MG TABLET: 100 | 90 days supply | Qty: 90 | Fill #1

## 2015-12-19 DIAGNOSIS — F411 Generalized anxiety disorder: Secondary | ICD-10-CM | POA: Diagnosis not present

## 2015-12-19 MED FILL — ZOLPIDEM TARTRATE 10 MG TAB: 10 | 90 days supply | Qty: 90 | Fill #0

## 2015-12-19 MED FILL — ALPRAZolam XR 0.5 MG TB24: 0.5 | 90 days supply | Qty: 270 | Fill #0 | Status: TO

## 2015-12-19 MED FILL — PARoxetine HCL 40 MG TABS: 40 | 90 days supply | Qty: 180 | Fill #0

## 2016-01-06 ENCOUNTER — Other Ambulatory Visit (HOSPITAL_COMMUNITY)
Admission: RE | Admit: 2016-01-06 | Discharge: 2016-01-06 | Disposition: A | Discharge status: Home / Self Care | Payer: 59 | Source: Ambulatory Visit | Attending: Family Medicine | Admitting: Family Medicine

## 2016-01-06 ENCOUNTER — Ambulatory Visit (INDEPENDENT_AMBULATORY_CARE_PROVIDER_SITE_OTHER): Payer: 59 | Admitting: Family Medicine

## 2016-01-06 VITALS — BP 112/82 | HR 98 | Temp 98.4°F | Wt 168.2 lb

## 2016-01-06 DIAGNOSIS — R319 Hematuria, unspecified: Secondary | ICD-10-CM

## 2016-01-06 DIAGNOSIS — M8580 Other specified disorders of bone density and structure, unspecified site: Secondary | ICD-10-CM

## 2016-01-06 DIAGNOSIS — Z1151 Encounter for screening for human papillomavirus (HPV): Secondary | ICD-10-CM | POA: Diagnosis not present

## 2016-01-06 DIAGNOSIS — M2550 Pain in unspecified joint: Secondary | ICD-10-CM

## 2016-01-06 DIAGNOSIS — Z01419 Encounter for gynecological examination (general) (routine) without abnormal findings: Secondary | ICD-10-CM | POA: Insufficient documentation

## 2016-01-06 DIAGNOSIS — R82998 Other abnormal findings in urine: Secondary | ICD-10-CM

## 2016-01-06 DIAGNOSIS — Z1239 Encounter for other screening for malignant neoplasm of breast: Secondary | ICD-10-CM

## 2016-01-06 DIAGNOSIS — Z0000 Encounter for general adult medical examination without abnormal findings: Secondary | ICD-10-CM

## 2016-01-06 DIAGNOSIS — N390 Urinary tract infection, site not specified: Secondary | ICD-10-CM

## 2016-01-06 DIAGNOSIS — Z23 Encounter for immunization: Secondary | ICD-10-CM

## 2016-01-06 DIAGNOSIS — Z0001 Encounter for general adult medical examination with abnormal findings: Secondary | ICD-10-CM | POA: Diagnosis not present

## 2016-01-06 DIAGNOSIS — F4329 Adjustment disorder with other symptoms: Secondary | ICD-10-CM | POA: Diagnosis not present

## 2016-01-06 MED ORDER — INSULIN PEN NEEDLE 31G X 8 MM MISC: 3 refills | Status: DC

## 2016-01-06 MED ORDER — SAXENDA 18 MG/3ML ~~LOC~~ SOPN: 3.0000 mg | PEN_INJECTOR | Freq: Every day | SUBCUTANEOUS | 5 refills | Status: DC

## 2016-01-06 MED ORDER — BUPROPION HCL ER (XL) 150 MG PO TB24: ORAL_TABLET | ORAL | 0 refills | Status: DC

## 2016-01-06 MED ORDER — ALENDRONATE SODIUM 70 MG PO TABS: 70.0000 mg | ORAL_TABLET | ORAL | 3 refills | Status: DC

## 2016-01-06 MED FILL — ALENDRONATE NA 70 MG TAB: 70 | 84 days supply | Qty: 12 | Fill #0

## 2016-01-06 MED FILL — BUPROPION HCL XL 150 MG TAB: 150 | 30 days supply | Qty: 60 | Fill #0

## 2016-01-06 MED FILL — ULTICARE PEN NDL 8MM 31G: 31G X 8 MM | 90 days supply | Qty: 100 | Fill #0

## 2016-01-06 NOTE — Assessment & Plan Note (Signed)
Check labs ghm utd See AVS 

## 2016-01-06 NOTE — Progress Notes (Signed)
Pre visit review using our clinic review tool, if applicable. No additional management support is needed unless otherwise documented below in the visit note. 

## 2016-01-06 NOTE — Patient Instructions (Signed)
Preventive Care for Adults, Female A healthy lifestyle and preventive care can promote health and wellness. Preventive health guidelines for women include the following key practices.  A routine yearly physical is a good way to check with your health care provider about your health and preventive screening. It is a chance to share any concerns and updates on your health and to receive a thorough exam.  Visit your dentist for a routine exam and preventive care every 6 months. Brush your teeth twice a day and floss once a day. Good oral hygiene prevents tooth decay and gum disease.  The frequency of eye exams is based on your age, health, family medical history, use of contact lenses, and other factors. Follow your health care provider's recommendations for frequency of eye exams.  Eat a healthy diet. Foods like vegetables, fruits, whole grains, low-fat dairy products, and lean protein foods contain the nutrients you need without too many calories. Decrease your intake of foods high in solid fats, added sugars, and salt. Eat the right amount of calories for you.Get information about a proper diet from your health care provider, if necessary.  Regular physical exercise is one of the most important things you can do for your health. Most adults should get at least 150 minutes of moderate-intensity exercise (any activity that increases your heart rate and causes you to sweat) each week. In addition, most adults need muscle-strengthening exercises on 2 or more days a week.  Maintain a healthy weight. The body mass index (BMI) is a screening tool to identify possible weight problems. It provides an estimate of body fat based on height and weight. Your health care provider can find your BMI and can help you achieve or maintain a healthy weight.For adults 20 years and older:  A BMI below 18.5 is considered underweight.  A BMI of 18.5 to 24.9 is normal.  A BMI of 25 to 29.9 is considered overweight.  A  BMI of 30 and above is considered obese.  Maintain normal blood lipids and cholesterol levels by exercising and minimizing your intake of saturated fat. Eat a balanced diet with plenty of fruit and vegetables. Blood tests for lipids and cholesterol should begin at age 45 and be repeated every 5 years. If your lipid or cholesterol levels are high, you are over 50, or you are at high risk for heart disease, you may need your cholesterol levels checked more frequently.Ongoing high lipid and cholesterol levels should be treated with medicines if diet and exercise are not working.  If you smoke, find out from your health care provider how to quit. If you do not use tobacco, do not start.  Lung cancer screening is recommended for adults aged 45-80 years who are at high risk for developing lung cancer because of a history of smoking. A yearly low-dose CT scan of the lungs is recommended for people who have at least a 30-pack-year history of smoking and are a current smoker or have quit within the past 15 years. A pack year of smoking is smoking an average of 1 pack of cigarettes a day for 1 year (for example: 1 pack a day for 30 years or 2 packs a day for 15 years). Yearly screening should continue until the smoker has stopped smoking for at least 15 years. Yearly screening should be stopped for people who develop a health problem that would prevent them from having lung cancer treatment.  If you are pregnant, do not drink alcohol. If you are  breastfeeding, be very cautious about drinking alcohol. If you are not pregnant and choose to drink alcohol, do not have more than 1 drink per day. One drink is considered to be 12 ounces (355 mL) of beer, 5 ounces (148 mL) of wine, or 1.5 ounces (44 mL) of liquor.  Avoid use of street drugs. Do not share needles with anyone. Ask for help if you need support or instructions about stopping the use of drugs.  High blood pressure causes heart disease and increases the risk  of stroke. Your blood pressure should be checked at least every 1 to 2 years. Ongoing high blood pressure should be treated with medicines if weight loss and exercise do not work.  If you are 55-79 years old, ask your health care provider if you should take aspirin to prevent strokes.  Diabetes screening is done by taking a blood sample to check your blood glucose level after you have not eaten for a certain period of time (fasting). If you are not overweight and you do not have risk factors for diabetes, you should be screened once every 3 years starting at age 45. If you are overweight or obese and you are 40-70 years of age, you should be screened for diabetes every year as part of your cardiovascular risk assessment.  Breast cancer screening is essential preventive care for women. You should practice "breast self-awareness." This means understanding the normal appearance and feel of your breasts and may include breast self-examination. Any changes detected, no matter how small, should be reported to a health care provider. Women in their 20s and 30s should have a clinical breast exam (CBE) by a health care provider as part of a regular health exam every 1 to 3 years. After age 40, women should have a CBE every year. Starting at age 40, women should consider having a mammogram (breast X-ray test) every year. Women who have a family history of breast cancer should talk to their health care provider about genetic screening. Women at a high risk of breast cancer should talk to their health care providers about having an MRI and a mammogram every year.  Breast cancer gene (BRCA)-related cancer risk assessment is recommended for women who have family members with BRCA-related cancers. BRCA-related cancers include breast, ovarian, tubal, and peritoneal cancers. Having family members with these cancers may be associated with an increased risk for harmful changes (mutations) in the breast cancer genes BRCA1 and  BRCA2. Results of the assessment will determine the need for genetic counseling and BRCA1 and BRCA2 testing.  Your health care provider may recommend that you be screened regularly for cancer of the pelvic organs (ovaries, uterus, and vagina). This screening involves a pelvic examination, including checking for microscopic changes to the surface of your cervix (Pap test). You may be encouraged to have this screening done every 3 years, beginning at age 21.  For women ages 30-65, health care providers may recommend pelvic exams and Pap testing every 3 years, or they may recommend the Pap and pelvic exam, combined with testing for human papilloma virus (HPV), every 5 years. Some types of HPV increase your risk of cervical cancer. Testing for HPV may also be done on women of any age with unclear Pap test results.  Other health care providers may not recommend any screening for nonpregnant women who are considered low risk for pelvic cancer and who do not have symptoms. Ask your health care provider if a screening pelvic exam is right for   you.  If you have had past treatment for cervical cancer or a condition that could lead to cancer, you need Pap tests and screening for cancer for at least 20 years after your treatment. If Pap tests have been discontinued, your risk factors (such as having a new sexual partner) need to be reassessed to determine if screening should resume. Some women have medical problems that increase the chance of getting cervical cancer. In these cases, your health care provider may recommend more frequent screening and Pap tests.  Colorectal cancer can be detected and often prevented. Most routine colorectal cancer screening begins at the age of 50 years and continues through age 75 years. However, your health care provider may recommend screening at an earlier age if you have risk factors for colon cancer. On a yearly basis, your health care provider may provide home test kits to check  for hidden blood in the stool. Use of a small camera at the end of a tube, to directly examine the colon (sigmoidoscopy or colonoscopy), can detect the earliest forms of colorectal cancer. Talk to your health care provider about this at age 50, when routine screening begins. Direct exam of the colon should be repeated every 5-10 years through age 75 years, unless early forms of precancerous polyps or small growths are found.  People who are at an increased risk for hepatitis B should be screened for this virus. You are considered at high risk for hepatitis B if:  You were born in a country where hepatitis B occurs often. Talk with your health care provider about which countries are considered high risk.  Your parents were born in a high-risk country and you have not received a shot to protect against hepatitis B (hepatitis B vaccine).  You have HIV or AIDS.  You use needles to inject street drugs.  You live with, or have sex with, someone who has hepatitis B.  You get hemodialysis treatment.  You take certain medicines for conditions like cancer, organ transplantation, and autoimmune conditions.  Hepatitis C blood testing is recommended for all people born from 1945 through 1965 and any individual with known risks for hepatitis C.  Practice safe sex. Use condoms and avoid high-risk sexual practices to reduce the spread of sexually transmitted infections (STIs). STIs include gonorrhea, chlamydia, syphilis, trichomonas, herpes, HPV, and human immunodeficiency virus (HIV). Herpes, HIV, and HPV are viral illnesses that have no cure. They can result in disability, cancer, and death.  You should be screened for sexually transmitted illnesses (STIs) including gonorrhea and chlamydia if:  You are sexually active and are younger than 24 years.  You are older than 24 years and your health care provider tells you that you are at risk for this type of infection.  Your sexual activity has changed  since you were last screened and you are at an increased risk for chlamydia or gonorrhea. Ask your health care provider if you are at risk.  If you are at risk of being infected with HIV, it is recommended that you take a prescription medicine daily to prevent HIV infection. This is called preexposure prophylaxis (PrEP). You are considered at risk if:  You are sexually active and do not regularly use condoms or know the HIV status of your partner(s).  You take drugs by injection.  You are sexually active with a partner who has HIV.  Talk with your health care provider about whether you are at high risk of being infected with HIV. If   you choose to begin PrEP, you should first be tested for HIV. You should then be tested every 3 months for as long as you are taking PrEP.  Osteoporosis is a disease in which the bones lose minerals and strength with aging. This can result in serious bone fractures or breaks. The risk of osteoporosis can be identified using a bone density scan. Women ages 67 years and over and women at risk for fractures or osteoporosis should discuss screening with their health care providers. Ask your health care provider whether you should take a calcium supplement or vitamin D to reduce the rate of osteoporosis.  Menopause can be associated with physical symptoms and risks. Hormone replacement therapy is available to decrease symptoms and risks. You should talk to your health care provider about whether hormone replacement therapy is right for you.  Use sunscreen. Apply sunscreen liberally and repeatedly throughout the day. You should seek shade when your shadow is shorter than you. Protect yourself by wearing long sleeves, pants, a wide-brimmed hat, and sunglasses year round, whenever you are outdoors.  Once a month, do a whole body skin exam, using a mirror to look at the skin on your back. Tell your health care provider of new moles, moles that have irregular borders, moles that  are larger than a pencil eraser, or moles that have changed in shape or color.  Stay current with required vaccines (immunizations).  Influenza vaccine. All adults should be immunized every year.  Tetanus, diphtheria, and acellular pertussis (Td, Tdap) vaccine. Pregnant women should receive 1 dose of Tdap vaccine during each pregnancy. The dose should be obtained regardless of the length of time since the last dose. Immunization is preferred during the 27th-36th week of gestation. An adult who has not previously received Tdap or who does not know her vaccine status should receive 1 dose of Tdap. This initial dose should be followed by tetanus and diphtheria toxoids (Td) booster doses every 10 years. Adults with an unknown or incomplete history of completing a 3-dose immunization series with Td-containing vaccines should begin or complete a primary immunization series including a Tdap dose. Adults should receive a Td booster every 10 years.  Varicella vaccine. An adult without evidence of immunity to varicella should receive 2 doses or a second dose if she has previously received 1 dose. Pregnant females who do not have evidence of immunity should receive the first dose after pregnancy. This first dose should be obtained before leaving the health care facility. The second dose should be obtained 4-8 weeks after the first dose.  Human papillomavirus (HPV) vaccine. Females aged 13-26 years who have not received the vaccine previously should obtain the 3-dose series. The vaccine is not recommended for use in pregnant females. However, pregnancy testing is not needed before receiving a dose. If a female is found to be pregnant after receiving a dose, no treatment is needed. In that case, the remaining doses should be delayed until after the pregnancy. Immunization is recommended for any person with an immunocompromised condition through the age of 61 years if she did not get any or all doses earlier. During the  3-dose series, the second dose should be obtained 4-8 weeks after the first dose. The third dose should be obtained 24 weeks after the first dose and 16 weeks after the second dose.  Zoster vaccine. One dose is recommended for adults aged 30 years or older unless certain conditions are present.  Measles, mumps, and rubella (MMR) vaccine. Adults born  before 1957 generally are considered immune to measles and mumps. Adults born in 1957 or later should have 1 or more doses of MMR vaccine unless there is a contraindication to the vaccine or there is laboratory evidence of immunity to each of the three diseases. A routine second dose of MMR vaccine should be obtained at least 28 days after the first dose for students attending postsecondary schools, health care workers, or international travelers. People who received inactivated measles vaccine or an unknown type of measles vaccine during 1963-1967 should receive 2 doses of MMR vaccine. People who received inactivated mumps vaccine or an unknown type of mumps vaccine before 1979 and are at high risk for mumps infection should consider immunization with 2 doses of MMR vaccine. For females of childbearing age, rubella immunity should be determined. If there is no evidence of immunity, females who are not pregnant should be vaccinated. If there is no evidence of immunity, females who are pregnant should delay immunization until after pregnancy. Unvaccinated health care workers born before 1957 who lack laboratory evidence of measles, mumps, or rubella immunity or laboratory confirmation of disease should consider measles and mumps immunization with 2 doses of MMR vaccine or rubella immunization with 1 dose of MMR vaccine.  Pneumococcal 13-valent conjugate (PCV13) vaccine. When indicated, a person who is uncertain of his immunization history and has no record of immunization should receive the PCV13 vaccine. All adults 65 years of age and older should receive this  vaccine. An adult aged 19 years or older who has certain medical conditions and has not been previously immunized should receive 1 dose of PCV13 vaccine. This PCV13 should be followed with a dose of pneumococcal polysaccharide (PPSV23) vaccine. Adults who are at high risk for pneumococcal disease should obtain the PPSV23 vaccine at least 8 weeks after the dose of PCV13 vaccine. Adults older than 47 years of age who have normal immune system function should obtain the PPSV23 vaccine dose at least 1 year after the dose of PCV13 vaccine.  Pneumococcal polysaccharide (PPSV23) vaccine. When PCV13 is also indicated, PCV13 should be obtained first. All adults aged 65 years and older should be immunized. An adult younger than age 65 years who has certain medical conditions should be immunized. Any person who resides in a nursing home or long-term care facility should be immunized. An adult smoker should be immunized. People with an immunocompromised condition and certain other conditions should receive both PCV13 and PPSV23 vaccines. People with human immunodeficiency virus (HIV) infection should be immunized as soon as possible after diagnosis. Immunization during chemotherapy or radiation therapy should be avoided. Routine use of PPSV23 vaccine is not recommended for American Indians, [place] Natives, or people younger than 65 years unless there are medical conditions that require PPSV23 vaccine. When indicated, people who have unknown immunization and have no record of immunization should receive PPSV23 vaccine. One-time revaccination 5 years after the first dose of PPSV23 is recommended for people aged 19-64 years who have chronic kidney failure, nephrotic syndrome, asplenia, or immunocompromised conditions. People who received 1-2 doses of PPSV23 before age 65 years should receive another dose of PPSV23 vaccine at age 65 years or later if at least 5 years have passed since the previous dose. Doses of PPSV23 are not  needed for people immunized with PPSV23 at or after age 65 years.  Meningococcal vaccine. Adults with asplenia or persistent complement component deficiencies should receive 2 doses of quadrivalent meningococcal conjugate (MenACWY-D) vaccine. The doses should be obtained   at least 2 months apart. Microbiologists working with certain meningococcal bacteria, [place] recruits, people at risk during an outbreak, and people who travel to or live in countries with a high rate of meningitis should be immunized. A first-year college student up through age 34 years who is living in a residence hall should receive a dose if she did not receive a dose on or after her 16th birthday. Adults who have certain high-risk conditions should receive one or more doses of vaccine.  Hepatitis A vaccine. Adults who wish to be protected from this disease, have certain high-risk conditions, work with hepatitis A-infected animals, work in hepatitis A research labs, or travel to or work in countries with a high rate of hepatitis A should be immunized. Adults who were previously unvaccinated and who anticipate close contact with an international adoptee during the first 60 days after arrival in the [place] from a country with a high rate of hepatitis A should be immunized.  Hepatitis B vaccine. Adults who wish to be protected from this disease, have certain high-risk conditions, may be exposed to blood or other infectious body fluids, are household contacts or sex partners of hepatitis B positive people, are clients or workers in certain care facilities, or travel to or work in countries with a high rate of hepatitis B should be immunized.  Haemophilus influenzae type b (Hib) vaccine. A previously unvaccinated person with asplenia or sickle cell disease or having a scheduled splenectomy should receive 1 dose of Hib vaccine. Regardless of previous immunization, a recipient of a hematopoietic stem cell transplant should receive a  3-dose series 6-12 months after her successful transplant. Hib vaccine is not recommended for adults with HIV infection. Preventive Services / Frequency Ages 35 to 4 years  Blood pressure check.** / Every 3-5 years.  Lipid and cholesterol check.** / Every 5 years beginning at age 60.  Clinical breast exam.** / Every 3 years for women in their 71s and 10s.  BRCA-related cancer risk assessment.** / For women who have family members with a BRCA-related cancer (breast, ovarian, tubal, or peritoneal cancers).  Pap test.** / Every 2 years from ages 76 through 26. Every 3 years starting at age 61 through age 76 or 93 with a history of 3 consecutive normal Pap tests.  HPV screening.** / Every 3 years from ages 37 through ages 60 to 51 with a history of 3 consecutive normal Pap tests.  Hepatitis C blood test.** / For any individual with known risks for hepatitis C.  Skin self-exam. / Monthly.  Influenza vaccine. / Every year.  Tetanus, diphtheria, and acellular pertussis (Tdap, Td) vaccine.** / Consult your health care provider. Pregnant women should receive 1 dose of Tdap vaccine during each pregnancy. 1 dose of Td every 10 years.  Varicella vaccine.** / Consult your health care provider. Pregnant females who do not have evidence of immunity should receive the first dose after pregnancy.  HPV vaccine. / 3 doses over 6 months, if 93 and younger. The vaccine is not recommended for use in pregnant females. However, pregnancy testing is not needed before receiving a dose.  Measles, mumps, rubella (MMR) vaccine.** / You need at least 1 dose of MMR if you were born in 1957 or later. You may also need a 2nd dose. For females of childbearing age, rubella immunity should be determined. If there is no evidence of immunity, females who are not pregnant should be vaccinated. If there is no evidence of immunity, females who are  pregnant should delay immunization until after pregnancy.  Pneumococcal  13-valent conjugate (PCV13) vaccine.** / Consult your health care provider.  Pneumococcal polysaccharide (PPSV23) vaccine.** / 1 to 2 doses if you smoke cigarettes or if you have certain conditions.  Meningococcal vaccine.** / 1 dose if you are age 68 to 8 years and a Market researcher living in a residence hall, or have one of several medical conditions, you need to get vaccinated against meningococcal disease. You may also need additional booster doses.  Hepatitis A vaccine.** / Consult your health care provider.  Hepatitis B vaccine.** / Consult your health care provider.  Haemophilus influenzae type b (Hib) vaccine.** / Consult your health care provider. Ages 7 to 53 years  Blood pressure check.** / Every year.  Lipid and cholesterol check.** / Every 5 years beginning at age 25 years.  Lung cancer screening. / Every year if you are aged 11-80 years and have a 30-pack-year history of smoking and currently smoke or have quit within the past 15 years. Yearly screening is stopped once you have quit smoking for at least 15 years or develop a health problem that would prevent you from having lung cancer treatment.  Clinical breast exam.** / Every year after age 48 years.  BRCA-related cancer risk assessment.** / For women who have family members with a BRCA-related cancer (breast, ovarian, tubal, or peritoneal cancers).  Mammogram.** / Every year beginning at age 41 years and continuing for as long as you are in good health. Consult with your health care provider.  Pap test.** / Every 3 years starting at age 65 years through age 37 or 70 years with a history of 3 consecutive normal Pap tests.  HPV screening.** / Every 3 years from ages 72 years through ages 60 to 40 years with a history of 3 consecutive normal Pap tests.  Fecal occult blood test (FOBT) of stool. / Every year beginning at age 21 years and continuing until age 5 years. You may not need to do this test if you get  a colonoscopy every 10 years.  Flexible sigmoidoscopy or colonoscopy.** / Every 5 years for a flexible sigmoidoscopy or every 10 years for a colonoscopy beginning at age 35 years and continuing until age 48 years.  Hepatitis C blood test.** / For all people born from [birth year] through 1965 and any individual with known risks for hepatitis C.  Skin self-exam. / Monthly.  Influenza vaccine. / Every year.  Tetanus, diphtheria, and acellular pertussis (Tdap/Td) vaccine.** / Consult your health care provider. Pregnant women should receive 1 dose of Tdap vaccine during each pregnancy. 1 dose of Td every 10 years.  Varicella vaccine.** / Consult your health care provider. Pregnant females who do not have evidence of immunity should receive the first dose after pregnancy.  Zoster vaccine.** / 1 dose for adults aged 30 years or older.  Measles, mumps, rubella (MMR) vaccine.** / You need at least 1 dose of MMR if you were born in 1957 or later. You may also need a second dose. For females of childbearing age, rubella immunity should be determined. If there is no evidence of immunity, females who are not pregnant should be vaccinated. If there is no evidence of immunity, females who are pregnant should delay immunization until after pregnancy.  Pneumococcal 13-valent conjugate (PCV13) vaccine.** / Consult your health care provider.  Pneumococcal polysaccharide (PPSV23) vaccine.** / 1 to 2 doses if you smoke cigarettes or if you have certain conditions.  Meningococcal vaccine.** /  Consult your health care provider.  Hepatitis A vaccine.** / Consult your health care provider.  Hepatitis B vaccine.** / Consult your health care provider.  Haemophilus influenzae type b (Hib) vaccine.** / Consult your health care provider. Ages 64 years and over  Blood pressure check.** / Every year.  Lipid and cholesterol check.** / Every 5 years beginning at age 23 years.  Lung cancer screening. / Every year if you  are aged 16-80 years and have a 30-pack-year history of smoking and currently smoke or have quit within the past 15 years. Yearly screening is stopped once you have quit smoking for at least 15 years or develop a health problem that would prevent you from having lung cancer treatment.  Clinical breast exam.** / Every year after age 74 years.  BRCA-related cancer risk assessment.** / For women who have family members with a BRCA-related cancer (breast, ovarian, tubal, or peritoneal cancers).  Mammogram.** / Every year beginning at age 44 years and continuing for as long as you are in good health. Consult with your health care provider.  Pap test.** / Every 3 years starting at age 58 years through age 22 or 39 years with 3 consecutive normal Pap tests. Testing can be stopped between 65 and 70 years with 3 consecutive normal Pap tests and no abnormal Pap or HPV tests in the past 10 years.  HPV screening.** / Every 3 years from ages 64 years through ages 70 or 61 years with a history of 3 consecutive normal Pap tests. Testing can be stopped between 65 and 70 years with 3 consecutive normal Pap tests and no abnormal Pap or HPV tests in the past 10 years.  Fecal occult blood test (FOBT) of stool. / Every year beginning at age 40 years and continuing until age 27 years. You may not need to do this test if you get a colonoscopy every 10 years.  Flexible sigmoidoscopy or colonoscopy.** / Every 5 years for a flexible sigmoidoscopy or every 10 years for a colonoscopy beginning at age 7 years and continuing until age 32 years.  Hepatitis C blood test.** / For all people born from [birth year] through 1967 and any individual with known risks for hepatitis C.  Osteoporosis screening.** / A one-time screening for women ages 30 years and over and women at risk for fractures or osteoporosis.  Skin self-exam. / Monthly.  Influenza vaccine. / Every year.  Tetanus, diphtheria, and acellular pertussis (Tdap/Td)  vaccine.** / 1 dose of Td every 10 years.  Varicella vaccine.** / Consult your health care provider.  Zoster vaccine.** / 1 dose for adults aged 35 years or older.  Pneumococcal 13-valent conjugate (PCV13) vaccine.** / Consult your health care provider.  Pneumococcal polysaccharide (PPSV23) vaccine.** / 1 dose for all adults aged 46 years and older.  Meningococcal vaccine.** / Consult your health care provider.  Hepatitis A vaccine.** / Consult your health care provider.  Hepatitis B vaccine.** / Consult your health care provider.  Haemophilus influenzae type b (Hib) vaccine.** / Consult your health care provider. ** Family history and personal history of risk and conditions may change your health care provider's recommendations.   This information is not intended to replace advice given to you by your health care provider. Make sure you discuss any questions you have with your health care provider.   Document Released: 06/15/2001 Document Revised: 05/10/2014 Document Reviewed: 09/14/2010 Elsevier Interactive Patient Education [employer].

## 2016-01-06 NOTE — Progress Notes (Signed)
Patient ID: Brooke [name], female    DOB: [birth year]  Age: 45 y.o. 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     Pt is also c/o stress at home and inc anxiety.   Review of Systems  Constitutional: Negative for activity change, appetite change, fatigue, fever and unexpected weight change.  HENT: Negative for congestion.   Respiratory: Negative for cough and shortness of breath.   Cardiovascular: Negative for chest pain, palpitations and leg swelling.  Gastrointestinal: Negative for abdominal pain, blood in stool and nausea.  Genitourinary: Negative for dysuria and frequency.  Musculoskeletal: Positive for arthralgias. Negative for gait problem and joint swelling.  Skin: Negative for rash.  Allergic/Immunologic: Negative for environmental allergies.  Neurological: Negative for dizziness and headaches.  Psychiatric/Behavioral: Negative for behavioral problems and dysphoric mood. The patient is not nervous/anxious.     History Past Medical History:  Diagnosis Date  . Anxiety   . Depression   . Hyperglycemia 03/05/2013  . Incontinence   . Lumbar stress fracture   . Osteoporosis     She has a past surgical history that includes Rhinoplasty; Tonsillectomy; and Sinus endo w/fusion.   Her family history includes Diabetes in her father, paternal aunt, and paternal grandmother; Hyperlipidemia in her father, mother, and paternal grandmother; Hypertension in her father, paternal grandmother, and paternal uncle; Mental illness in her sister; Thyroid disease in her  mother.She reports that she has never smoked. She has never used smokeless tobacco. She reports that she does not drink alcohol or use drugs.  Current Outpatient Prescriptions on File Prior to Visit  Medication Sig Dispense Refill  . ALPRAZolam (XANAX) 0.5 MG tablet Take 1 mg by mouth at bedtime as needed for sleep.     . ARIPiprazole (ABILIFY) 5 MG tablet Take 2.5 mg by mouth daily.      . calcium citrate-vitamin D (CITRACAL+D) 315-200 MG-UNIT per tablet Take 2 tablets by mouth daily.    . cholecalciferol (VITAMIN D) 1000 UNITS tablet Take 1,000 Units by mouth daily.    . Cranberry 1000 MG CAPS Take 1 capsule by mouth every evening.     Marland Kitchen ibuprofen (ADVIL,MOTRIN) 200 MG tablet Take 400 mg by mouth every 6 (six) hours as needed for moderate pain.    [place] Kitchen PARoxetine (PAXIL) 20 MG tablet Take 80 mg by mouth at bedtime.     . potassium chloride SA (K-DUR,KLOR-CON) 20 MEQ tablet TAKE 1 TABLET (20 MEQ) BY MOUTH DAILY. 90 tablet 1  . topiramate (TOPAMAX) 100 MG tablet Take 100 mg by mouth at bedtime.     . traZODone (DESYREL) 25 mg TABS Take 25-50 mg by mouth at bedtime as needed for sleep.     [place] Kitchen zolpidem (AMBIEN) 5 MG tablet Take 5 mg by mouth at bedtime as needed for sleep.     No current facility-administered medications on file prior to visit.      Objective:  Objective  Physical Exam  Constitutional: She is oriented to person, place, and time.  She appears well-developed and well-nourished. No distress.  HENT:  Head: Normocephalic and atraumatic.  Right Ear: External ear normal.  Left Ear: External ear normal.  Nose: Nose normal.  Mouth/Throat: Oropharynx is clear and moist.  Eyes: Conjunctivae and EOM are normal. Pupils are equal, round, and reactive to light. Right eye exhibits no discharge. Left eye exhibits no discharge.  Neck: Normal range of motion. Neck supple. No JVD present. No thyromegaly present.  Cardiovascular: Normal rate, regular rhythm, normal heart sounds and intact distal  pulses.   No murmur heard. Pulmonary/Chest: Effort normal and breath sounds normal. No respiratory distress. She has no wheezes. She has no rales. She exhibits no tenderness.  Abdominal: Soft. Bowel sounds are normal. She exhibits no distension and no mass. There is no tenderness. There is no rebound and no guarding.  Genitourinary: Vagina normal and uterus normal. Rectal exam shows guaiac negative stool. No vaginal discharge found.  Musculoskeletal: Normal range of motion. She exhibits no edema or tenderness.  Lymphadenopathy:    She has no cervical adenopathy.  Neurological: She is alert and oriented to person, place, and time. She has normal reflexes. No cranial nerve deficit.  Skin: Skin is warm and dry. No rash noted. She is not diaphoretic. No erythema.  Psychiatric: She has a normal mood and affect. Her behavior is normal. Judgment and thought content normal.  Nursing note and vitals reviewed.  BP 112/82 (BP Location: Left Arm, Patient Position: Sitting, Cuff Size: Normal)   Pulse 98   Temp 98.4 F (36.9 C) (Oral)   Wt 168 lb 3.2 oz (76.3 kg)   LMP 12/23/2015   SpO2 99%   BMI 29.33 kg/m  Wt Readings from Last 3 Encounters:  01/06/16 168 lb 3.2 oz (76.3 kg)  08/22/15 160 lb (72.6 kg)  08/03/15 157 lb 6.4 oz (71.4 kg)     Lab Results  Component Value Date   WBC 4.1 01/07/2016   HGB 14.5 01/07/2016   HCT 42.2 01/07/2016   PLT 215.0 01/07/2016   GLUCOSE 96 01/07/2016   CHOL 163 01/07/2016   TRIG 130.0 01/07/2016   HDL 44.00 01/07/2016   LDLDIRECT 130.6 09/05/2009   LDLCALC 93 01/07/2016   ALT 19 01/07/2016   AST 18 01/07/2016   NA 141 01/07/2016   K 3.7 01/07/2016   CL 109 01/07/2016   CREATININE 0.91 01/07/2016   BUN 11 01/07/2016   CO2 26 01/07/2016   TSH 1.37 01/07/2016   HGBA1C 5.5 03/05/2013   MICROALBUR 0.6 03/05/2013    No results found.   Assessment & Plan:  Plan  I have discontinued Ms. Boak's amoxicillin-clavulanate, benzonatate, azithromycin,  cephALEXin, predniSONE, and triamcinolone cream. I have also changed her ULTICARE SHORT PEN NEEDLES to Insulin Pen Needle. Additionally, I am having her start on buPROPion. Lastly, I am having her maintain her topiramate, ALPRAZolam, PARoxetine, ARIPiprazole, traZODone, zolpidem, calcium citrate-vitamin D, cholecalciferol, Cranberry, ibuprofen, potassium chloride SA, alendronate, and SAXENDA.  Meds ordered this encounter  Medications  . alendronate (FOSAMAX) 70 MG tablet    Sig: Take 1 tablet (70 mg total) by mouth every 7 (seven) days. Take with a full glass of water on an empty stomach.    Dispense:  12 tablet    Refill:  3  . SAXENDA 18 MG/3ML SOPN    Sig: Inject 3 mg into the skin daily.    Dispense:  3 mL    Refill:  5  . buPROPion (WELLBUTRIN XL) 150 MG 24 hr tablet  Sig: 1 po qd x 7 days then increase to 2 a day    Dispense:  60 tablet    Refill:  0  . Insulin Pen Needle (ULTICARE SHORT PEN NEEDLES) 31G X 8 MM MISC    Sig: USE AS DIRECTED TO INJECT SAXENDA    Dispense:  100 each    Refill:  3    Problem List Items Addressed This Visit      Unprioritized   Morbid obesity ([place])   Relevant Medications   SAXENDA 18 MG/3ML SOPN   buPROPion (WELLBUTRIN XL) 150 MG 24 hr tablet   Insulin Pen Needle (ULTICARE SHORT PEN NEEDLES) 31G X 8 MM MISC   Preventative health care - Primary    Check labs  ghm utd See AVS       Relevant Orders   Cytology - PAP    Other Visit Diagnoses    Encounter for immunization       Relevant Medications   alendronate (FOSAMAX) 70 MG tablet   SAXENDA 18 MG/3ML SOPN   buPROPion (WELLBUTRIN XL) 150 MG 24 hr tablet   Insulin Pen Needle (ULTICARE SHORT PEN NEEDLES) 31G X 8 MM MISC   Other Relevant Orders   Flu Vaccine QUAD 36+ mos IM (Completed)   Lipid panel (Completed)   CBC with Differential/Platelet (Completed)   POCT urinalysis dipstick (Completed)   TSH (Completed)   Vitamin D 1,25 dihydroxy   Comprehensive metabolic panel (Completed)    Antinuclear Antib (ANA)   Sedimentation rate (Completed)   Ambulatory referral to Rheumatology   DG Bone Density   MM DIGITAL SCREENING BILATERAL   Cytology - PAP   Osteopenia       Relevant Medications   alendronate (FOSAMAX) 70 MG tablet   Other Relevant Orders   DG Bone Density   Multiple joint pain       Relevant Orders   Lipid panel (Completed)   CBC with Differential/Platelet (Completed)   POCT urinalysis dipstick (Completed)   TSH (Completed)   Vitamin D 1,25 dihydroxy   Comprehensive metabolic panel (Completed)   Antinuclear Antib (ANA)   Sedimentation rate (Completed)   Ambulatory referral to Rheumatology   Stress and adjustment reaction       Relevant Medications   buPROPion (WELLBUTRIN XL) 150 MG 24 hr tablet   Breast cancer screening       Relevant Orders   MM DIGITAL SCREENING BILATERAL   Hematuria       Relevant Orders   Urine culture   Leukocytes in urine       Relevant Orders   Urine culture      Follow-up: Return in about 6 months (around 07/05/2016), or weight loss.  Ann Held, DO

## 2016-01-07 ENCOUNTER — Encounter: Payer: Self-pay | Admitting: Family Medicine

## 2016-01-07 ENCOUNTER — Other Ambulatory Visit: Payer: 59

## 2016-01-07 DIAGNOSIS — N390 Urinary tract infection, site not specified: Secondary | ICD-10-CM | POA: Diagnosis not present

## 2016-01-07 DIAGNOSIS — M2550 Pain in unspecified joint: Secondary | ICD-10-CM | POA: Diagnosis not present

## 2016-01-07 DIAGNOSIS — Z23 Encounter for immunization: Secondary | ICD-10-CM | POA: Diagnosis not present

## 2016-01-07 DIAGNOSIS — R319 Hematuria, unspecified: Secondary | ICD-10-CM | POA: Diagnosis not present

## 2016-01-07 LAB — LIPID PANEL
CHOL/HDL RATIO: 4
CHOLESTEROL: 163 mg/dL (ref 0–200)
HDL: 44 mg/dL (ref >39.00)
LDL Cholesterol: 93 mg/dL (ref 0–99)
NonHDL: 119.49
Triglycerides: 130 mg/dL (ref 0.0–149.0)
VLDL: 26 mg/dL (ref 0.0–40.0)

## 2016-01-07 LAB — POCT URINALYSIS DIPSTICK
Bilirubin, UA: NEGATIVE
Glucose, UA: NEGATIVE
Ketones, UA: NEGATIVE
Nitrite, UA: NEGATIVE
SPEC GRAV UA: 1.015
UROBILINOGEN UA: 0.2
pH, UA: 8

## 2016-01-07 LAB — CBC WITH DIFFERENTIAL/PLATELET
Basophils Absolute: 0 10*3/uL (ref 0.0–0.1)
Basophils Relative: 0.3 % (ref 0.0–3.0)
EOS ABS: 0.2 10*3/uL (ref 0.0–0.7)
Eosinophils Relative: 4.7 % (ref 0.0–5.0)
HEMATOCRIT: 42.2 % (ref 36.0–46.0)
Hemoglobin: 14.5 g/dL (ref 12.0–15.0)
Lymphocytes Relative: 28.4 % (ref 12.0–46.0)
Lymphs Abs: 1.2 10*3/uL (ref 0.7–4.0)
MCHC: 34.3 g/dL (ref 30.0–36.0)
MCV: 90.6 fl (ref 78.0–100.0)
MONO ABS: 0.3 10*3/uL (ref 0.1–1.0)
Monocytes Relative: 8.1 % (ref 3.0–12.0)
Neutro Abs: 2.4 10*3/uL (ref 1.4–7.7)
Neutrophils Relative %: 58.5 % (ref 43.0–77.0)
Platelets: 215 10*3/uL (ref 150.0–400.0)
RBC: 4.65 Mil/uL (ref 3.87–5.11)
RDW: 13.4 % (ref 11.5–15.5)
WBC: 4.1 10*3/uL (ref 4.0–10.5)

## 2016-01-07 LAB — COMPREHENSIVE METABOLIC PANEL
ALT: 19 U/L (ref 0–35)
AST: 18 U/L (ref 0–37)
Albumin: 3.9 g/dL (ref 3.5–5.2)
Alkaline Phosphatase: 68 U/L (ref 39–117)
BUN: 11 mg/dL (ref 6–23)
CHLORIDE: 109 meq/L (ref 96–112)
CO2: 26 mEq/L (ref 19–32)
CREATININE: 0.91 mg/dL (ref 0.40–1.20)
Calcium: 8.7 mg/dL (ref 8.4–10.5)
GFR: 70.35 mL/min (ref >60.00)
GLUCOSE: 96 mg/dL (ref 70–99)
POTASSIUM: 3.7 meq/L (ref 3.5–5.1)
SODIUM: 141 meq/L (ref 135–145)
TOTAL PROTEIN: 6.1 g/dL (ref 6.0–8.3)
Total Bilirubin: 0.3 mg/dL (ref 0.2–1.2)

## 2016-01-07 LAB — TSH: TSH: 1.37 u[IU]/mL (ref 0.35–4.50)

## 2016-01-07 LAB — SEDIMENTATION RATE: Sed Rate: 3 mm/hr (ref 0–20)

## 2016-01-08 ENCOUNTER — Other Ambulatory Visit: Payer: Self-pay | Admitting: Family Medicine

## 2016-01-08 DIAGNOSIS — Z1231 Encounter for screening mammogram for malignant neoplasm of breast: Secondary | ICD-10-CM

## 2016-01-08 LAB — CYTOLOGY - PAP

## 2016-01-08 LAB — ANA: Anti Nuclear Antibody(ANA): NEGATIVE

## 2016-01-08 LAB — URINE CULTURE: Organism ID, Bacteria: NO GROWTH

## 2016-01-10 LAB — VITAMIN D 1,25 DIHYDROXY
VITAMIN D 1, 25 (OH) TOTAL: 59 pg/mL (ref 18–72)
Vitamin D3 1, 25 (OH)2: 59 pg/mL

## 2016-01-16 ENCOUNTER — Ambulatory Visit (INDEPENDENT_AMBULATORY_CARE_PROVIDER_SITE_OTHER)
Admission: RE | Admit: 2016-01-16 | Discharge: 2016-01-16 | Disposition: A | Discharge status: Home / Self Care | Payer: 59 | Source: Ambulatory Visit | Attending: Family Medicine | Admitting: Family Medicine

## 2016-01-16 ENCOUNTER — Ambulatory Visit
Admission: RE | Admit: 2016-01-16 | Discharge: 2016-01-16 | Disposition: A | Discharge status: Home / Self Care | Payer: 59 | Source: Ambulatory Visit | Attending: Family Medicine | Admitting: Family Medicine

## 2016-01-16 DIAGNOSIS — Z1231 Encounter for screening mammogram for malignant neoplasm of breast: Secondary | ICD-10-CM | POA: Diagnosis not present

## 2016-01-16 DIAGNOSIS — M8580 Other specified disorders of bone density and structure, unspecified site: Secondary | ICD-10-CM | POA: Diagnosis not present

## 2016-01-16 DIAGNOSIS — Z23 Encounter for immunization: Secondary | ICD-10-CM

## 2016-01-20 ENCOUNTER — Telehealth: Payer: Self-pay | Admitting: Family Medicine

## 2016-01-20 NOTE — Telephone Encounter (Signed)
PA initiated for Saxenda on 01/20/16. Forms received from the pharmacy and left in the red folder to be completed by Dr.Lowne. [id number]. PA pending     KP

## 2016-01-20 NOTE — Telephone Encounter (Signed)
Pharmacy ([facility]) called to follow up on PA for Saxenda. Plse adv @ [phone]

## 2016-01-22 ENCOUNTER — Other Ambulatory Visit: Payer: Self-pay | Admitting: Specialist

## 2016-01-22 ENCOUNTER — Ambulatory Visit (HOSPITAL_COMMUNITY)
Admission: RE | Admit: 2016-01-22 | Discharge: 2016-01-22 | Disposition: A | Discharge status: Home / Self Care | Payer: 59 | Source: Ambulatory Visit | Attending: Specialist | Admitting: Specialist

## 2016-01-22 DIAGNOSIS — M8580 Other specified disorders of bone density and structure, unspecified site: Secondary | ICD-10-CM | POA: Insufficient documentation

## 2016-01-22 DIAGNOSIS — M25551 Pain in right hip: Secondary | ICD-10-CM | POA: Diagnosis not present

## 2016-01-23 NOTE — Telephone Encounter (Signed)
PA Case [id number] is denied.

## 2016-01-23 NOTE — Telephone Encounter (Signed)
I called to check the status of the PA and it is still pending.     KP

## 2016-01-23 NOTE — Telephone Encounter (Signed)
I thought that might happen because she has no co morbidities Let pt know

## 2016-02-06 ENCOUNTER — Telehealth: Payer: Self-pay | Admitting: Family Medicine

## 2016-02-06 ENCOUNTER — Other Ambulatory Visit (HOSPITAL_COMMUNITY): Payer: Self-pay | Admitting: Specialist

## 2016-02-06 ENCOUNTER — Other Ambulatory Visit: Payer: Self-pay | Admitting: Family Medicine

## 2016-02-06 ENCOUNTER — Encounter: Payer: Self-pay | Admitting: Family Medicine

## 2016-02-06 DIAGNOSIS — M545 Low back pain: Secondary | ICD-10-CM

## 2016-02-06 DIAGNOSIS — M25551 Pain in right hip: Secondary | ICD-10-CM

## 2016-02-06 DIAGNOSIS — F4329 Adjustment disorder with other symptoms: Secondary | ICD-10-CM

## 2016-02-06 MED FILL — BUPROPION HCL XL 150 MG TAB: 150 | 30 days supply | Qty: 60 | Fill #0

## 2016-02-06 MED FILL — TOPIRAMATE 100 MG TABLET: 100 | 90 days supply | Qty: 90 | Fill #2

## 2016-02-06 NOTE — Telephone Encounter (Signed)
Caller name: Relationship to patient: Self Can be reached: [phone] Pharmacy:  [place], [place] - [address] [phone] ([phone] (Fax)     Reason for call: Patient called stating that the Wellbutrin is not working for her weight loss and she would like to know if there is something else she can take that will be covered by her insurance would not cover Saxenda. Plse adv. Patient informed that provider is out of the office,

## 2016-02-06 NOTE — Telephone Encounter (Signed)
Could try Topamax 25 mg po qhs and can increase dosing by 25 mg daily each week to a max of 100 mg to see if that helps. Would have her come in for follow up in one month for reevaluation. Disp #[id number]

## 2016-02-06 NOTE — Telephone Encounter (Signed)
Would you suggest an alternative.    KP

## 2016-02-06 NOTE — Telephone Encounter (Signed)
Dr.Blyth Kirke Shaggy was not covered for weight loss, do you have any pother suggestions. Please advise     KP

## 2016-02-06 NOTE — Telephone Encounter (Signed)
Sorry missed the Topamax. I do not have any other great ideas. There are prescriptions but would ask her PMD to make that decision with her when she gets back because they all have risk and are expensive so she knows her better

## 2016-02-06 NOTE — Telephone Encounter (Signed)
Dr. Charlett Blake pt is already on Topamax 100mg  po qhs for her headaches. She states that it is not helping for weight loss. She wants to know what other medication can she take for weight loss.  Please advise     PC

## 2016-02-06 NOTE — Telephone Encounter (Signed)
Patient aware, voiced understanding and agreed. Please advise     KP

## 2016-02-08 NOTE — Telephone Encounter (Signed)
We could try to max topamax dose up to 100 mg 2x a day We could change the rx to 50 mg and she can take 2 in am and 1 at night for 1 week and then 2 bid  We could also consider wellbutrin

## 2016-02-09 NOTE — Telephone Encounter (Signed)
The patient is on Wellbutrin.   KP

## 2016-02-09 NOTE — Telephone Encounter (Signed)
Is she taking 2 150 mg ----  If not increase to 300 mg

## 2016-02-10 NOTE — Telephone Encounter (Signed)
Yes she is taking 2 of the 150.    KP

## 2016-02-10 NOTE — Telephone Encounter (Signed)
Besides maximizing the topamax and wellbutrin the only other option is trying adipex 37.5 mg #30  1 po qam but that is only indicated for 3 months--- I just worry about increased anxiety/ palp with it

## 2016-02-11 ENCOUNTER — Ambulatory Visit (HOSPITAL_COMMUNITY)
Admission: RE | Admit: 2016-02-11 | Discharge: 2016-02-11 | Disposition: A | Discharge status: Home / Self Care | Payer: 59 | Source: Ambulatory Visit | Attending: Specialist | Admitting: Specialist

## 2016-02-11 DIAGNOSIS — M47816 Spondylosis without myelopathy or radiculopathy, lumbar region: Secondary | ICD-10-CM | POA: Insufficient documentation

## 2016-02-11 DIAGNOSIS — X58XXXA Exposure to other specified factors, initial encounter: Secondary | ICD-10-CM | POA: Insufficient documentation

## 2016-02-11 DIAGNOSIS — S76111A Strain of right quadriceps muscle, fascia and tendon, initial encounter: Secondary | ICD-10-CM | POA: Diagnosis not present

## 2016-02-11 DIAGNOSIS — M545 Low back pain: Secondary | ICD-10-CM | POA: Diagnosis present

## 2016-02-11 DIAGNOSIS — M7061 Trochanteric bursitis, right hip: Secondary | ICD-10-CM | POA: Insufficient documentation

## 2016-02-11 DIAGNOSIS — M25551 Pain in right hip: Secondary | ICD-10-CM

## 2016-02-11 DIAGNOSIS — M5126 Other intervertebral disc displacement, lumbar region: Secondary | ICD-10-CM | POA: Diagnosis not present

## 2016-02-11 DIAGNOSIS — S76312A Strain of muscle, fascia and tendon of the posterior muscle group at thigh level, left thigh, initial encounter: Secondary | ICD-10-CM | POA: Insufficient documentation

## 2016-02-12 ENCOUNTER — Ambulatory Visit (HOSPITAL_COMMUNITY): Admission: RE | Admit: 2016-02-12 | Payer: 59 | Source: Ambulatory Visit

## 2016-02-12 ENCOUNTER — Ambulatory Visit (HOSPITAL_COMMUNITY): Payer: 59

## 2016-02-13 ENCOUNTER — Ambulatory Visit (HOSPITAL_COMMUNITY): Payer: 59

## 2016-02-13 MED ORDER — PHENTERMINE HCL 37.5 MG PO CAPS: 37.5000 mg | ORAL_CAPSULE | ORAL | 0 refills | Status: DC

## 2016-02-13 MED FILL — PHENTERMINE 37.5 MG TABLET: 37.5 | 30 days supply | Qty: 30 | Fill #0

## 2016-02-13 NOTE — Telephone Encounter (Signed)
Patient wanted to try the phentermine. Rx called into the pharmacy, she will call and schedule the 1 mo f/u.      KP

## 2016-02-27 DIAGNOSIS — M7061 Trochanteric bursitis, right hip: Secondary | ICD-10-CM | POA: Diagnosis not present

## 2016-02-27 DIAGNOSIS — M545 Low back pain: Secondary | ICD-10-CM | POA: Diagnosis not present

## 2016-03-08 ENCOUNTER — Encounter: Payer: Self-pay | Admitting: Family Medicine

## 2016-03-08 NOTE — Telephone Encounter (Signed)
wellbutrin could potentially make you more anxious as well--- we can maximize the dose and inc to 450 -- ( 3,  150 mg tablets) ---  You make want to call the ins co and find out why saxenda was not coverd for you and it was for others   It may be because they feel like you dont weigh enough and you don't have any co morbid conditions ---- ( you are too healty compared to others)

## 2016-03-08 NOTE — Telephone Encounter (Signed)
wellbutrin can make anxiety worse too--- it can be inc to 150 mg 3 a day for total o 450 mg  saxenda -- her ins would not cover  She can call them and see if there is any circumstance where they will and I'll be happy to try again

## 2016-03-09 ENCOUNTER — Other Ambulatory Visit: Payer: Self-pay | Admitting: Family Medicine

## 2016-03-09 ENCOUNTER — Encounter: Payer: Self-pay | Admitting: Family Medicine

## 2016-03-09 DIAGNOSIS — E663 Overweight: Secondary | ICD-10-CM

## 2016-03-09 MED ORDER — NALTREXONE-BUPROPION HCL ER 8-90 MG PO TB12: ORAL_TABLET | ORAL | 4 refills | Status: DC

## 2016-03-12 DIAGNOSIS — M7061 Trochanteric bursitis, right hip: Secondary | ICD-10-CM | POA: Diagnosis not present

## 2016-03-16 ENCOUNTER — Telehealth: Payer: Self-pay

## 2016-03-16 MED FILL — CONTRAVE ER 8-90 MG TABLET: 8-90 | 30 days supply | Qty: 120 | Fill #0

## 2016-03-16 NOTE — Telephone Encounter (Signed)
Medication for contrave has been approved Pt has been contacted [facility]

## 2016-03-16 NOTE — Telephone Encounter (Signed)
PA for contrave has been started for Pt awaiting response for insurance 03/16/16 PC

## 2016-03-23 MED FILL — PARoxetine HCL 40 MG TABS: 40 | 90 days supply | Qty: 180 | Fill #1

## 2016-04-21 MED FILL — traZODone HCL 50 MG TABS: 50 | 90 days supply | Qty: 180 | Fill #0

## 2016-04-21 MED FILL — ARIPiprazole 5 MG TABS: 5 | 90 days supply | Qty: 90 | Fill #0

## 2016-04-28 ENCOUNTER — Ambulatory Visit (INDEPENDENT_AMBULATORY_CARE_PROVIDER_SITE_OTHER): Payer: 59 | Admitting: Family Medicine

## 2016-04-28 VITALS — BP 102/68 | HR 99 | Temp 98.3°F | Resp 16 | Ht 63.0 in | Wt 175.0 lb

## 2016-04-28 DIAGNOSIS — J069 Acute upper respiratory infection, unspecified: Secondary | ICD-10-CM

## 2016-04-28 DIAGNOSIS — R05 Cough: Secondary | ICD-10-CM | POA: Diagnosis not present

## 2016-04-28 DIAGNOSIS — B9789 Other viral agents as the cause of diseases classified elsewhere: Secondary | ICD-10-CM

## 2016-04-28 DIAGNOSIS — R059 Cough, unspecified: Secondary | ICD-10-CM

## 2016-04-28 DIAGNOSIS — J218 Acute bronchiolitis due to other specified organisms: Secondary | ICD-10-CM

## 2016-04-28 MED ORDER — BENZONATATE 100 MG PO CAPS: 100.0000 mg | ORAL_CAPSULE | Freq: Three times a day (TID) | ORAL | 0 refills | Status: DC | PRN

## 2016-04-28 MED ORDER — AZITHROMYCIN 250 MG PO TABS: ORAL_TABLET | ORAL | 0 refills | Status: DC

## 2016-04-28 MED FILL — BENZONATATE 100 MG CAPSULE: 100 | 5 days supply | Qty: 30 | Fill #0

## 2016-04-28 NOTE — Patient Instructions (Addendum)
Drink plenty of fluids and get enough rest  Take the benzonatate cough pills one or 2 pills every 6 or 8 hours as needed for cough. Do not exceed 6 in 24 hours.  If you are not improving by late this week or sometime in the weekend and continue to bring up a lot of purulent mucus, or if you start running a significant fever, cough ahead and take the prescription for azithromycin. If you do not need it just destroy the  prescription. This appears to be a viral infection at this point. Bringing up green phlegm does not mean it is bacterial. However if you have started improving and get worse that is when we most suspect it to be a secondary bacterial infection.  Return as needed   IF you received an x-ray today, you will receive an invoice from [facility]. Please contact [facility] at [phone] with questions or concerns regarding your invoice.   IF you received labwork today, you will receive an invoice from [facility]. Please contact [facility] at [phone] with questions or concerns regarding your invoice.   Our billing staff will not be able to assist you with questions regarding bills from these companies.  You will be contacted with the lab results as soon as they are available. The fastest way to get your results is to activate your My Chart account. Instructions are located on the last page of this paperwork. If you have not heard from [place] regarding the results in 2 weeks, please contact this office.

## 2016-04-28 NOTE — Progress Notes (Signed)
Patient ID: Brooke [name], female    DOB: [birth year]  Age: 45 y.o. MRN: [id number]  Chief Complaint  Patient presents with  . Cough    x 3 days  . Nasal Congestion    Subjective:   45 year old lady who comes in here with a history of about 3 days of cough and nasal congestion. She has not been significantly febrile. She does not have excessive body aches. She is coughing up some [name] phlegm. She does not smoke. She works as an or Marine scientist at [employer]. She is generally healthy. She does see a psychiatrist and is on medication for her anxiety and depression that she is struggling with time, but is stable. She does have a regular family physician also.  Current allergies, medications, problem list, past/family and social histories reviewed.  Objective:  BP 102/68 (BP Location: Right Arm, Patient Position: Sitting, Cuff Size: Normal)   Pulse 99   Temp 98.3 F (36.8 C)   Resp 16   Ht 5\' 3"  (1.6 m)   Wt 175 lb (79.4 kg)   LMP 03/29/2016   SpO2 97%   BMI 31.00 kg/m   No major acute distress. TMs are normal. Throat not erythematous. Neck supple without significant nodes. Chest is clear to auscultation without wheezing, rhonchi, rales. Heart regular without murmur. She has no GI complaints.  Assessment & Plan:   Assessment: 1. Cough   2. Acute upper respiratory infection   3. Acute viral bronchiolitis       Plan: Will treat for a viral URI and cough. However with it being an approaching holiday weekend again flexibly giving her a course of azithromycin to take if she is not improving.  No orders of the defined types were placed in this encounter.   Meds ordered this encounter  Medications  . benzonatate (TESSALON) 100 MG capsule    Sig: Take 1-2 capsules (100-200 mg total) by mouth 3 (three) times daily as needed.    Dispense:  30 capsule    Refill:  0         Patient Instructions   Drink plenty of fluids and get enough rest  Take the benzonatate cough pills  one or 2 pills every 6 or 8 hours as needed for cough. Do not exceed 6 in 24 hours.  If you are not improving by late this week or sometime in the weekend and continue to bring up a lot of purulent mucus, or if you start running a significant fever, cough ahead and take the prescription for azithromycin. If you do not need it just destroy the  prescription. This appears to be a viral infection at this point. Bringing up [name] phlegm does not mean it is bacterial. However if you have started improving and get worse that is when we most suspect it to be a secondary bacterial infection.  Return as needed   IF you received an x-ray today, you will receive an invoice from [facility]. Please contact [facility] at [phone] with questions or concerns regarding your invoice.   IF you received labwork today, you will receive an invoice from [facility]. Please contact [facility] at [phone] with questions or concerns regarding your invoice.   Our billing staff will not be able to assist you with questions regarding bills from these companies.  You will be contacted with the lab results as soon as they are available. The fastest way to get your results is to activate your My Chart account. Instructions are  located on the last page of this paperwork. If you have not heard from [place] regarding the results in 2 weeks, please contact this office.         Return if symptoms worsen or fail to improve.   Brooke Direnzo, MD 04/28/2016

## 2016-05-18 MED FILL — ALPRAZolam ER 0.5 MG TB24: 0.5 | 90 days supply | Qty: 270 | Fill #0 | Status: TO

## 2016-05-19 MED FILL — TOPIRAMATE 100 MG TABLET: 100 | 90 days supply | Qty: 90 | Fill #0

## 2016-05-21 DIAGNOSIS — M7061 Trochanteric bursitis, right hip: Secondary | ICD-10-CM | POA: Diagnosis not present

## 2016-07-01 MED FILL — PARoxetine HCL 40 MG TABS: 40 | 90 days supply | Qty: 180 | Fill #0

## 2016-07-08 ENCOUNTER — Ambulatory Visit: Payer: Self-pay | Admitting: Family Medicine

## 2016-09-01 MED FILL — TOPIRAMATE 100 MG TABLET: 100 | 90 days supply | Qty: 90 | Fill #0

## 2016-09-02 MED FILL — ARIPiprazole 5 MG TABS: 5 | 90 days supply | Qty: 90 | Fill #0

## 2016-10-11 ENCOUNTER — Ambulatory Visit (INDEPENDENT_AMBULATORY_CARE_PROVIDER_SITE_OTHER): Payer: 59 | Admitting: Physician Assistant

## 2016-10-11 ENCOUNTER — Encounter: Payer: Self-pay | Admitting: Physician Assistant

## 2016-10-11 VITALS — BP 105/71 | HR 92 | Temp 98.5°F | Resp 18 | Ht 63.47 in | Wt 171.0 lb

## 2016-10-11 DIAGNOSIS — S61216A Laceration without foreign body of right little finger without damage to nail, initial encounter: Secondary | ICD-10-CM

## 2016-10-11 MED FILL — PARoxetine HCL 40 MG TABS: 40 | 90 days supply | Qty: 180 | Fill #0

## 2016-10-11 MED FILL — ALPRAZolam XR 0.5 MG TB24: 0.5 | 90 days supply | Qty: 270 | Fill #0

## 2016-10-11 NOTE — Patient Instructions (Addendum)
Laceration Care, Adult A laceration is a cut that goes through all layers of the skin. The cut also goes into the tissue that is right under the skin. Some cuts heal on their own. Others need to be closed with stitches (sutures), staples, skin adhesive strips, or wound glue. Taking care of your cut lowers your risk of infection and helps your cut to heal better. How to take care of your cut For stitches or staples:  Keep the wound clean and dry.  If you were given a bandage (dressing), you should change it at least one time per day or as told by your doctor. You should also change it if it gets wet or dirty.  Keep the wound completely dry for the first 24 hours or as told by your doctor. After that time, you may take a shower or a bath. However, make sure that the wound is not soaked in water until after the stitches or staples have been removed.  Clean the wound one time each day or as told by your doctor: ? Wash the wound with soap and water. ? Rinse the wound with water until all of the soap comes off. ? Pat the wound dry with a clean towel. Do not rub the wound.  After you clean the wound, put a thin layer of antibiotic ointment on it as told by your doctor. This ointment: ? Helps to prevent infection. ? Keeps the bandage from sticking to the wound.  Have your stitches or staples removed as told by your doctor. If your doctor used skin adhesive strips:  Keep the wound clean and dry.  If you were given a bandage, you should change it at least one time per day or as told by your doctor. You should also change it if it gets dirty or wet.  Do not get the skin adhesive strips wet. You can take a shower or a bath, but be careful to keep the wound dry.  If the wound gets wet, pat it dry with a clean towel. Do not rub the wound.  Skin adhesive strips fall off on their own. You can trim the strips as the wound heals. Do not remove any strips that are still stuck to the wound. They will fall  off after a while. If your doctor used wound glue:  Try to keep your wound dry, but you may briefly wet it in the shower or bath. Do not soak the wound in water, such as by swimming.  After you take a shower or a bath, gently pat the wound dry with a clean towel. Do not rub the wound.  Do not do any activities that will make you really sweaty until the skin glue has fallen off on its own.  Do not apply liquid, cream, or ointment medicine to your wound while the skin glue is still on.  If you were given a bandage, you should change it at least one time per day or as told by your doctor. You should also change it if it gets dirty or wet.  If a bandage is placed over the wound, do not let the tape for the bandage touch the skin glue.  Do not pick at the glue. The skin glue usually stays on for 5-10 days. Then, it falls off of the skin. General Instructions  To help prevent scarring, make sure to cover your wound with sunscreen whenever you are outside after stitches are removed, after adhesive strips are removed, or   when wound glue stays in place and the wound is healed. Make sure to wear a sunscreen of at least 30 SPF.  Take over-the-counter and prescription medicines only as told by your doctor.  If you were given antibiotic medicine or ointment, take or apply it as told by your doctor. Do not stop using the antibiotic even if your wound is getting better.  Do not scratch or pick at the wound.  Keep all follow-up visits as told by your doctor. This is important.  Check your wound every day for signs of infection. Watch for: ? Redness, swelling, or pain. ? Fluid, blood, or pus.  Raise (elevate) the injured area above the level of your heart while you are sitting or lying down, if possible. Get help if:  You got a tetanus shot and you have any of these problems at the injection site: ? Swelling. ? Very bad pain. ? Redness. ? Bleeding.  You have a fever.  A wound that was  closed breaks open.  You notice a bad smell coming from your wound or your bandage.  You notice something coming out of the wound, such as wood or glass.  Medicine does not help your pain.  You have more redness, swelling, or pain at the site of your wound.  You have fluid, blood, or pus coming from your wound.  You notice a change in the color of your skin near your wound.  You need to change the bandage often because fluid, blood, or pus is coming from the wound.  You start to have a new rash.  You start to have numbness around the wound. Get help right away if:  You have very bad swelling around the wound.  Your pain suddenly gets worse and is very bad.  You notice painful lumps near the wound or on skin that is anywhere on your body.  You have a red streak going away from your wound.  The wound is on your hand or foot and you cannot move a finger or toe like you usually can.  The wound is on your hand or foot and you notice that your fingers or toes look pale or bluish. This information is not intended to replace advice given to you by your health care provider. Make sure you discuss any questions you have with your health care provider. Document Released: 10/06/2007 Document Revised: 09/25/2015 Document Reviewed: 04/15/2014 Elsevier Interactive Patient Education  2018 [employer]     IF you received an x-ray today, you will receive an invoice from [facility]. Please contact [facility] at [phone] with questions or concerns regarding your invoice.   IF you received labwork today, you will receive an invoice from [facility]. Please contact [facility] at [phone] with questions or concerns regarding your invoice.   Our billing staff will not be able to assist you with questions regarding bills from these companies.  You will be contacted with the lab results as soon as they are available. The fastest way to get your results is to activate your  My Chart account. Instructions are located on the last page of this paperwork. If you have not heard from us regarding the results in 2 weeks, please contact this office.      

## 2016-10-13 ENCOUNTER — Encounter: Payer: Self-pay | Admitting: Family Medicine

## 2016-10-13 ENCOUNTER — Ambulatory Visit (INDEPENDENT_AMBULATORY_CARE_PROVIDER_SITE_OTHER): Payer: 59 | Admitting: Family Medicine

## 2016-10-13 VITALS — BP 108/72 | HR 92 | Temp 98.4°F | Resp 18 | Ht 63.39 in | Wt 171.0 lb

## 2016-10-13 DIAGNOSIS — L0390 Cellulitis, unspecified: Secondary | ICD-10-CM

## 2016-10-13 DIAGNOSIS — S61216S Laceration without foreign body of right little finger without damage to nail, sequela: Secondary | ICD-10-CM | POA: Diagnosis not present

## 2016-10-13 MED ORDER — CEPHALEXIN 500 MG PO CAPS: 500.0000 mg | ORAL_CAPSULE | Freq: Three times a day (TID) | ORAL | 0 refills | Status: DC

## 2016-10-13 MED FILL — CEPHALEXIN 500 MG CAPSULE: 500 | 7 days supply | Qty: 21 | Fill #0

## 2016-10-13 NOTE — Patient Instructions (Addendum)
  Like we discussed it's a little difficult to tell whether this is related to the start of an infection. It does have the redness and swelling that makes me a little concerned.  Take the Keflex 3 times a day for the next 7 days. Being as it is already midafternoon you will only have to take it twice today.  I have sent this in for you.  If you start noticing any worsening redness, worsening swelling, purulent drainage from the area picture to come back and see [place]. The antibiotic should prevent all this.  It was good to meet you today!   IF you received an x-ray today, you will receive an invoice from [facility]. Please contact [facility] at [phone] with questions or concerns regarding your invoice.   IF you received labwork today, you will receive an invoice from [facility]. Please contact [facility] at [phone] with questions or concerns regarding your invoice.   Our billing staff will not be able to assist you with questions regarding bills from these companies.  You will be contacted with the lab results as soon as they are available. The fastest way to get your results is to activate your My Chart account. Instructions are located on the last page of this paperwork. If you have not heard from [place] regarding the results in 2 weeks, please contact this office.

## 2016-10-13 NOTE — Progress Notes (Addendum)
Brooke [name] is a 46 y.o. female who presents to Primary Care at [facility] today for Left finger swelling:  1.  Left finger swelling:  Patient was sweeping up broken glass on Monday when she cut herself on her right little finger with a piece of broken glass. She was able to stop the bleeding herself. However due to the deepness of the cut she came here about an hour afterward. She had several stitches placed. Since then she has noticed some increased swelling along the end of her finger as well as some redness along the end of her finger. She was concerned this was a sign of the start of an infection. Therefore she presented to care today.  No redness streaking up her hand. No pain in any other part of her hand. No fevers or chills. She is eating and drinking well. No nausea vomiting.  ROS as above.   PMH reviewed. Patient is a nonsmoker.   Past Medical History:  Diagnosis Date  . Anxiety   . Depression   . Hyperglycemia 03/05/2013  . Incontinence   . Lumbar stress fracture   . Osteoporosis    Past Surgical History:  Procedure Laterality Date  . RHINOPLASTY    . SINUS ENDO W/FUSION    . TONSILLECTOMY      Medications reviewed. Current Outpatient Prescriptions  Medication Sig Dispense Refill  . alendronate (FOSAMAX) 70 MG tablet Take 1 tablet (70 mg total) by mouth every 7 (seven) days. Take with a full glass of water on an empty stomach. 12 tablet 3  . ALPRAZolam (XANAX) 0.5 MG tablet Take 1 mg by mouth at bedtime as needed for sleep.     . ARIPiprazole (ABILIFY) 5 MG tablet Take 2.5 mg by mouth daily.      . calcium citrate-vitamin D (CITRACAL+D) 315-200 MG-UNIT per tablet Take 2 tablets by mouth daily.    . cholecalciferol (VITAMIN D) 1000 UNITS tablet Take 1,000 Units by mouth daily.    . Cranberry 1000 MG CAPS Take 1 capsule by mouth every evening.     Marland Kitchen ibuprofen (ADVIL,MOTRIN) 200 MG tablet Take 400 mg by mouth every 6 (six) hours as needed for moderate pain.    Marland Kitchen  PARoxetine (PAXIL) 20 MG tablet Take 80 mg by mouth at bedtime.     . potassium chloride SA (K-DUR,KLOR-CON) 20 MEQ tablet TAKE 1 TABLET (20 MEQ) BY MOUTH DAILY. 90 tablet 1  . topiramate (TOPAMAX) 100 MG tablet Take 100 mg by mouth at bedtime.     . traZODone (DESYREL) 25 mg TABS Take 25-50 mg by mouth at bedtime as needed for sleep.     Marland Kitchen zolpidem (AMBIEN) 5 MG tablet Take 5 mg by mouth at bedtime as needed for sleep.     No current facility-administered medications for this visit.      Physical Exam:  BP 108/72   Pulse 92   Temp 98.4 F (36.9 C) (Oral)   Resp 18   Ht 5' 3.39" (1.61 m)   Wt 171 lb (77.6 kg)   LMP 09/12/2016 (Approximate)   SpO2 100%   BMI 29.92 kg/m  Gen:  Alert, cooperative patient who appears stated age in no acute distress.  Vital signs reviewed. HEENT: EOMI,  MMM Skin: She has a healing laceration noted distal to the DIP joint on the medial aspect of her right fifth digit. She has stitches in place here. No drainage. She does have some swelling along the end of  her finger. There is some redness around the area that does not extend beyond the DIP joint. It is tender to palpation along most of the end of her finger.   Neuro:  Sensation fully intact in all 5 fingers of the right hand.  Assessment and Plan:  1.  Laceration: - laceration itself is healing well.  She does have question of cellulitis - It is hard to tell what is normal inflammation and swelling versus cellulitis. As noted in the exam above she has no evidence of lymphangitis, though does have redness and swelling of the finger. -Risk of treatment with antibiotic is less than the risk of finger infection and its sequelae. Therefore plan to treat today with Keflex.  -return/warning precautions provided.  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

## 2016-10-14 NOTE — Progress Notes (Signed)
PRIMARY CARE AT [place], [place] [address] [phone]  Date:  10/11/2016   Name:  Brooke [name]   DOB:  31-Mar-1971   MRN:  [id number]  PCP:  Ann Held, DO    History of Present Illness:  Brooke [name] is a 46 y.o. female patient who presents to PCP with  Chief Complaint  Patient presents with  . Laceration    X 1 day- right pinky     Patient has laceration from sweeping hand on the floor, after breaking glass.  She washed it this and covered it to go here.     Past Surgical History:  Procedure Laterality Date  . RHINOPLASTY    . SINUS ENDO W/FUSION    . TONSILLECTOMY        No Known Allergies  Medication list has been reviewed and updated.    ROS ROS otherwise unremarkable unless listed above.  Physical Examination: BP 105/71 (BP Location: Right Arm, Patient Position: Sitting, Cuff Size: Small)   Pulse 92   Temp 98.5 F (36.9 C) (Oral)   Resp 18   Ht 5' 3.47" (1.612 m)   Wt 171 lb (77.6 kg)   LMP 09/07/2016 (Exact Date)   SpO2 100%   BMI 29.85 kg/m  Ideal Body Weight: Weight in (lb) to have BMI = 25: 142.9  Physical Exam  Constitutional: She is oriented to person, place, and time. She appears well-developed and well-nourished. No distress.  HENT:  Head: Normocephalic and atraumatic.  Right Ear: External ear normal.  Left Ear: External ear normal.  Eyes: Conjunctivae and EOM are normal. Pupils are equal, round, and reactive to light.  Cardiovascular: Normal rate, regular rhythm and intact distal pulses.  Exam reveals no friction rub.   No murmur heard. Pulmonary/Chest: Effort normal and breath sounds normal. No accessory muscle usage. No apnea. No respiratory distress. She has no decreased breath sounds. She has no wheezes. She has no rhonchi.  Neurological: She is alert and oriented to person, place, and time.  Skin: She is not diaphoretic.  Psychiatric: She has a normal mood and affect. Her behavior is normal.    Procedure: verbal consent obtained.  1% lidocaine digital block to the right 5th metatarsal.  Anesthesia obtained.  Washed with soap and water.  Distal right 5th with 1cm laceration along palmar aspect.  2 simple interrupted with 5-0 ethilon placed.  Cleansed with normal sterile water.  Dressing placed.   Assessment and Plan: Brooke [name] is a 46 y.o. female who is here today for cc of  Chief Complaint  Patient presents with  . Laceration    X 1 day- right pinky  remove after 7 days.  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 Alarming symptoms discussed to warrant an immediate return. Tetanus utd. Laceration of right little finger without foreign body without damage to nail, initial encounter  Ivar Drape, PA-C [facility] 6/14/20189:25 AM

## 2016-11-09 MED FILL — ZOLPIDEM TARTRATE 10 MG TAB: 10 | 90 days supply | Qty: 90 | Fill #0

## 2016-12-21 MED FILL — TOPIRAMATE 100 MG TABLET: 100 | 90 days supply | Qty: 90 | Fill #0

## 2017-01-17 MED FILL — PARoxetine HCL 40 MG TABS: 40 | 90 days supply | Qty: 180 | Fill #0

## 2017-01-19 MED FILL — ARIPiprazole 5 MG TABS: 5 | 90 days supply | Qty: 90 | Fill #0

## 2017-02-07 MED FILL — ZOLPIDEM TARTRATE 10 MG TAB: 10 | 90 days supply | Qty: 90 | Fill #0

## 2017-02-16 DIAGNOSIS — F411 Generalized anxiety disorder: Secondary | ICD-10-CM | POA: Diagnosis not present

## 2017-03-11 MED FILL — TOPIRAMATE 50 MG TABLET: 50 | 90 days supply | Qty: 270 | Fill #0 | Status: TO

## 2017-03-27 IMAGING — CR DG CHEST 2V
2 series · 2 of 2 positions shown · non-contrast
Comparison: None.

CLINICAL DATA: Cough, congestion invasive throughout.

EXAM:
CHEST  2 VIEW

[PA]
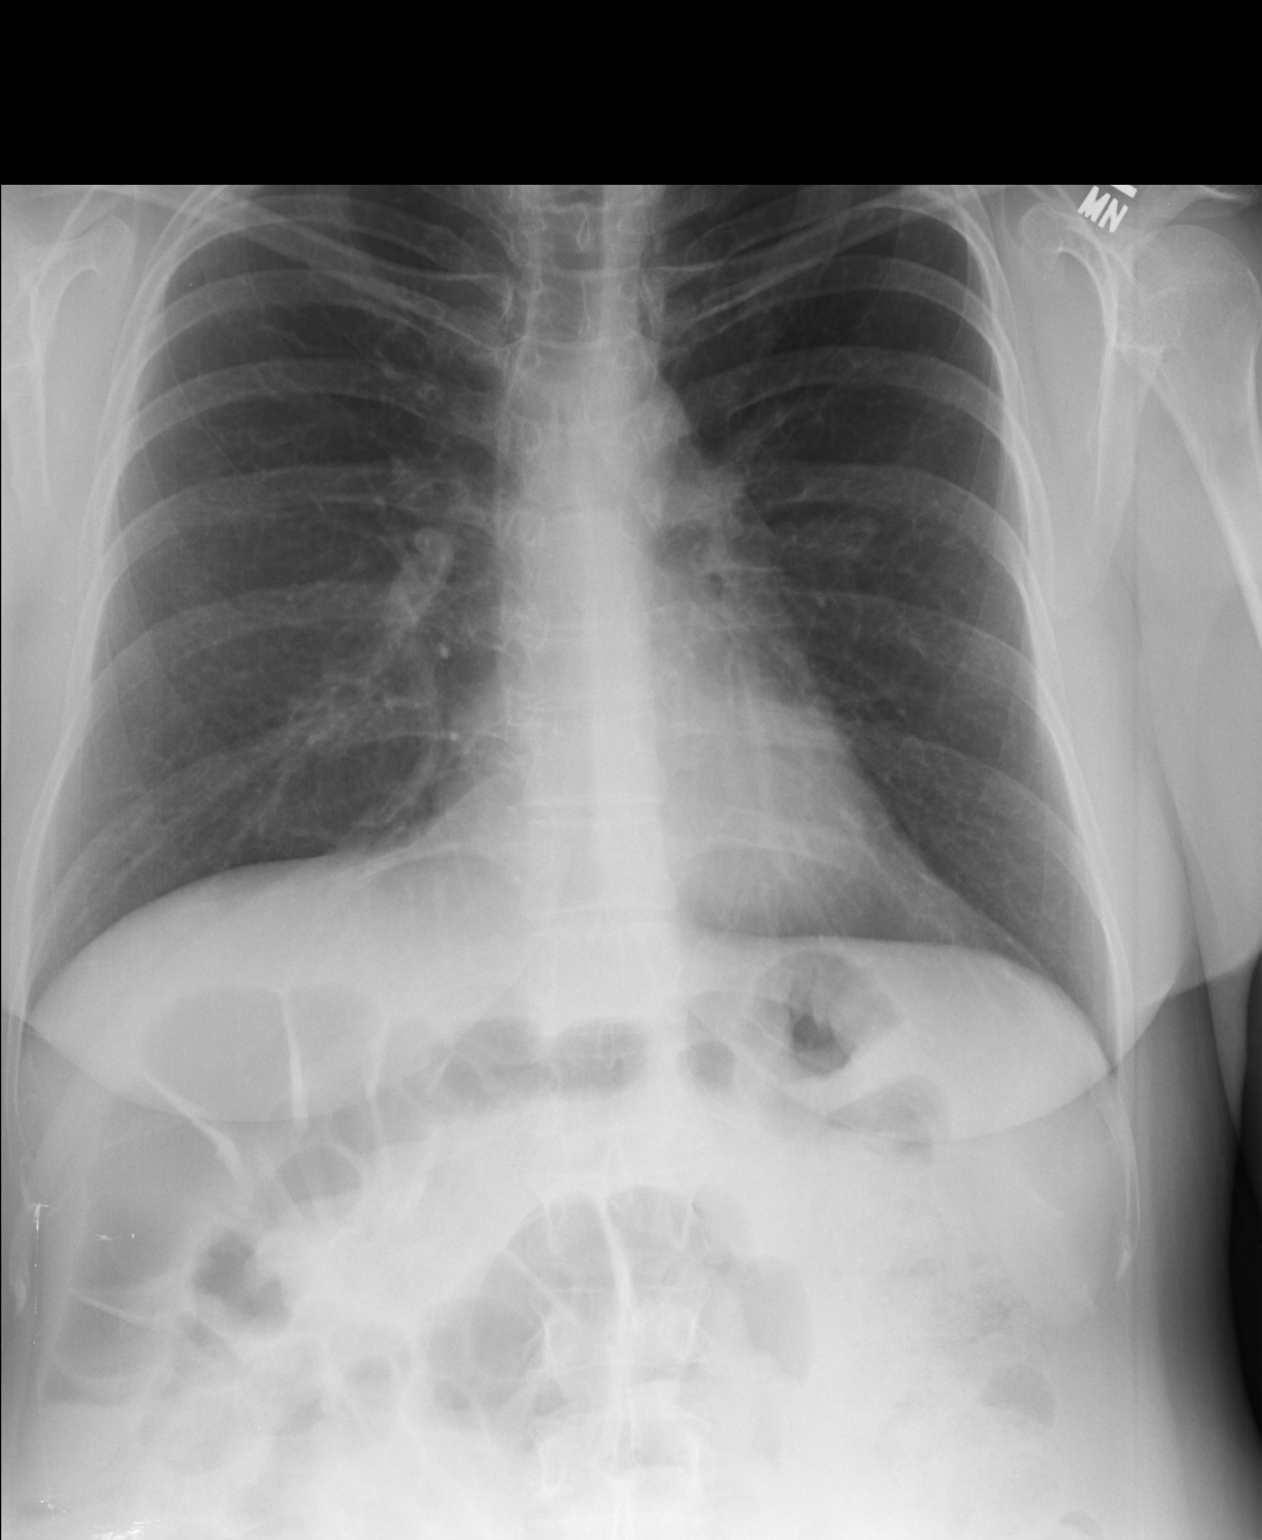

[lateral]
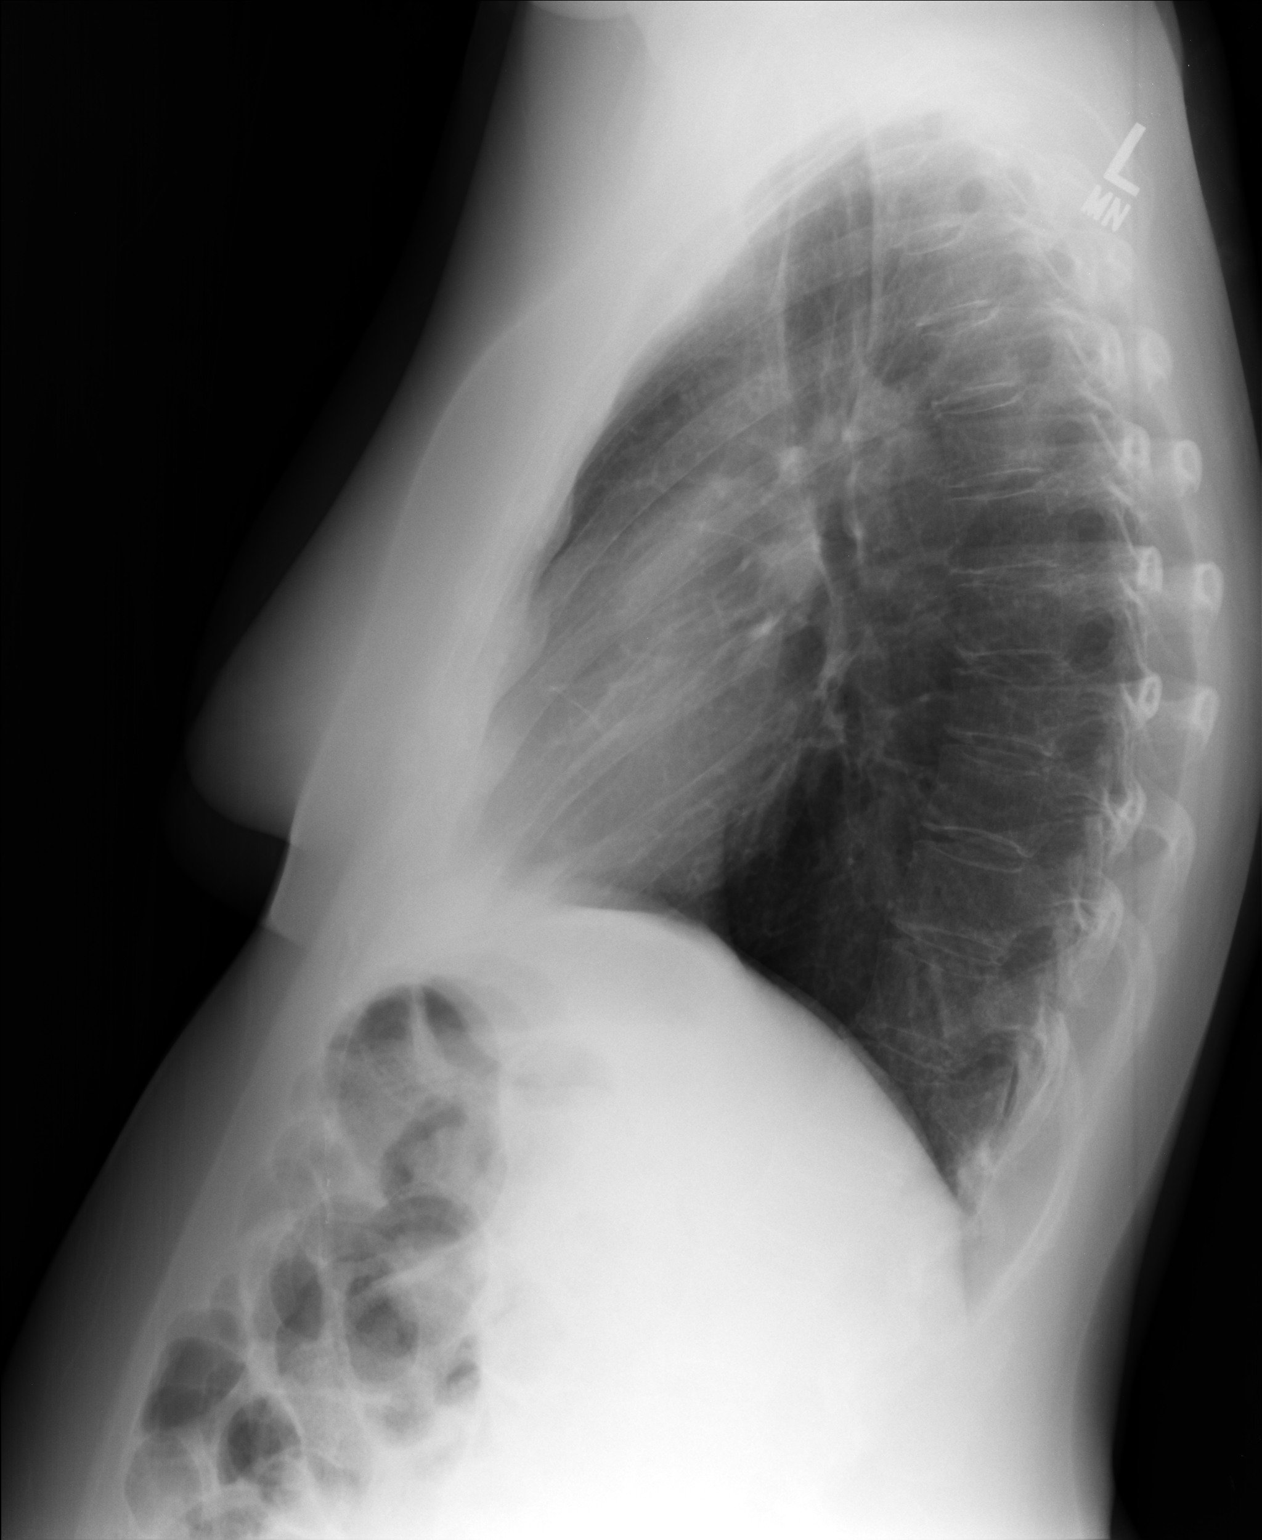

[2 of 2 positions shown; findings below may reference images not displayed]

FINDINGS: The heart size and mediastinal contours are within normal limits.
Both lungs are clear. The visualized skeletal structures are
unremarkable.
IMPRESSION: Lungs are clear and there is no evidence of acute cardiopulmonary
abnormality.

## 2017-05-04 MED FILL — ALPRAZolam ER 0.5 MG TB24: 0.5 | 30 days supply | Qty: 90 | Fill #0

## 2017-05-04 MED FILL — PARoxetine HCL 40 MG TABS: 40 | 30 days supply | Qty: 60 | Fill #0

## 2017-05-04 MED FILL — ZOLPIDEM TARTRATE 10 MG TAB: 10 | 30 days supply | Qty: 30 | Fill #0

## 2017-05-10 MED FILL — traZODone HCL 50 MG TABS: 50 | 30 days supply | Qty: 60 | Fill #0

## 2017-05-11 MED FILL — ARIPiprazole 5 MG TABS: 5 | 90 days supply | Qty: 90 | Fill #0

## 2017-06-14 MED FILL — PARoxetine HCL 40 MG TABS: 40 | 30 days supply | Qty: 60 | Fill #0

## 2017-06-15 ENCOUNTER — Ambulatory Visit (INDEPENDENT_AMBULATORY_CARE_PROVIDER_SITE_OTHER): Payer: No Typology Code available for payment source | Admitting: Family Medicine

## 2017-06-15 ENCOUNTER — Encounter: Payer: Self-pay | Admitting: Family Medicine

## 2017-06-15 VITALS — BP 102/68 | HR 87 | Temp 98.2°F | Ht 63.0 in | Wt 185.2 lb

## 2017-06-15 DIAGNOSIS — L03116 Cellulitis of left lower limb: Secondary | ICD-10-CM

## 2017-06-15 MED ORDER — DOXYCYCLINE HYCLATE 100 MG PO TABS: 100.0000 mg | ORAL_TABLET | Freq: Two times a day (BID) | ORAL | 0 refills | Status: DC

## 2017-06-15 MED FILL — DOXYCYCLINE HYCLATE 100 MG: 100 | 10 days supply | Qty: 20 | Fill #0

## 2017-06-15 NOTE — Progress Notes (Signed)
Chief Complaint  Patient presents with  . Cyst    Brooke [name] is a 47 y.o. female here for a skin complaint.  Duration: 1 week Location: LLE Pruritic? No Painful? Yes Drainage? No New soaps/lotions/topicals/detergents? No Sick contacts? No Other associated symptoms: Feels hard, concerned other areas on R leg are arising Therapies tried thus far: none  ROS:  Const: No fevers Skin: As noted in HPI  Past Medical History:  Diagnosis Date  . Anxiety   . Depression   . Hyperglycemia 03/05/2013  . Incontinence   . Lumbar stress fracture   . Osteoporosis    No Known Allergies Allergies as of 06/15/2017   No Known Allergies     Medication List        Accurate as of 06/15/17 12:32 PM. Always use your most recent med list.          ABILIFY 5 MG tablet Generic drug:  ARIPiprazole Take 2.5 mg by mouth daily.   alendronate 70 MG tablet Commonly known as:  FOSAMAX Take 1 tablet (70 mg total) by mouth every 7 (seven) days. Take with a full glass of water on an empty stomach.   ALPRAZolam 0.5 MG tablet Commonly known as:  XANAX Take 1 mg by mouth at bedtime as needed for sleep.   AMBIEN 5 MG tablet Generic drug:  zolpidem Take 5 mg by mouth at bedtime as needed for sleep.   calcium citrate-vitamin D 315-200 MG-UNIT tablet Commonly known as:  CITRACAL+D Take 2 tablets by mouth daily.   cephALEXin 500 MG capsule Commonly known as:  KEFLEX Take 1 capsule (500 mg total) by mouth 3 (three) times daily.   cholecalciferol 1000 units tablet Commonly known as:  VITAMIN D Take 1,000 Units by mouth daily.   Cranberry 1000 MG Caps Take 1 capsule by mouth every evening.   doxycycline 100 MG tablet Commonly known as:  VIBRA-TABS Take 1 tablet (100 mg total) by mouth 2 (two) times daily.   ibuprofen 200 MG tablet Commonly known as:  ADVIL,MOTRIN Take 400 mg by mouth every 6 (six) hours as needed for moderate pain.   PAXIL 20 MG tablet Generic drug:   PARoxetine Take 80 mg by mouth at bedtime.   potassium chloride SA 20 MEQ tablet Commonly known as:  K-DUR,KLOR-CON TAKE 1 TABLET (20 MEQ) BY MOUTH DAILY.   TOPAMAX 100 MG tablet Generic drug:  topiramate Take 100 mg by mouth at bedtime.   traZODone 25 mg Tabs tablet Commonly known as:  DESYREL Take 25-50 mg by mouth at bedtime as needed for sleep.       BP 102/68 (BP Location: Left Arm, Patient Position: Sitting, Cuff Size: Normal)   Pulse 87   Temp 98.2 F (36.8 C) (Oral)   Ht 5\' 3"  (1.6 m)   Wt 185 lb 4 oz (84 kg)   SpO2 97%   BMI 32.82 kg/m  Gen: awake, alert, appearing stated age Lungs: No accessory muscle use Skin: See below. +warmth, +TTP. No drainage, fluctuance, excoriation Psych: Age appropriate judgment and insight  Media Information     Cellulitis of left lower extremity - Plan: doxycycline (VIBRA-TABS) 100 MG tablet  Orders as above. Warning s/s's verbalized and written down. F/u prn. The patient voiced understanding and agreement to the plan.  [place], DO 06/15/17 12:32 PM

## 2017-06-15 NOTE — Progress Notes (Signed)
Pre visit review using our clinic review tool, if applicable. No additional management support is needed unless otherwise documented below in the visit note. 

## 2017-06-15 NOTE — Patient Instructions (Signed)
Monitor sun exposure.  Take medicine with food.  Eat protein.  Things to look out for: increasing pain not relieved by ibuprofen/acetaminophen, fevers, spreading redness, drainage of pus, or foul odor.

## 2017-06-23 MED FILL — TOPIRAMATE 50 MG TABLET: 50 | 90 days supply | Qty: 270 | Fill #0

## 2017-06-27 ENCOUNTER — Encounter: Payer: Self-pay | Admitting: Family Medicine

## 2017-06-27 ENCOUNTER — Ambulatory Visit (INDEPENDENT_AMBULATORY_CARE_PROVIDER_SITE_OTHER): Payer: No Typology Code available for payment source | Admitting: Family Medicine

## 2017-06-27 ENCOUNTER — Ambulatory Visit: Payer: Self-pay | Admitting: Family Medicine

## 2017-06-27 VITALS — BP 102/64 | HR 88 | Temp 97.8°F | Ht 63.5 in | Wt 189.1 lb

## 2017-06-27 DIAGNOSIS — R600 Localized edema: Secondary | ICD-10-CM | POA: Diagnosis not present

## 2017-06-27 MED ORDER — KETOCONAZOLE 2 % EX CREA: TOPICAL_CREAM | CUTANEOUS | 0 refills | Status: DC

## 2017-06-27 MED FILL — KETOCONAZOLE 2% CREAM: 2 | 42 days supply | Qty: 60 | Fill #0

## 2017-06-27 NOTE — Progress Notes (Signed)
Pre visit review using our clinic review tool, if applicable. No additional management support is needed unless otherwise documented below in the visit note. 

## 2017-06-27 NOTE — Progress Notes (Signed)
Chief Complaint  Patient presents with  . Edema  . Cellulitis    Brooke [name] here for bilateral leg swelling.  Duration: 1 week Hx of prolonged bedrest, recent surgery, travel or injury? No Pain the calf? No SOB? No Personal or family history of clot or bleeding disorder? No Hx of heart failure, renal failure, hepatic failure? No  ROS:  MSK- +leg swelling, no pain Lungs- no SOB  Past Medical History:  Diagnosis Date  . Anxiety   . Depression   . Hyperglycemia 03/05/2013  . Incontinence   . Lumbar stress fracture   . Osteoporosis    Family History  Problem Relation Age of Onset  . Thyroid disease Mother   . Hyperlipidemia Mother   . Diabetes Father   . Hypertension Father   . Hyperlipidemia Father   . Diabetes Paternal Grandmother   . Hypertension Paternal Grandmother   . Hyperlipidemia Paternal Grandmother   . Mental illness Sister   . Hypertension Paternal Uncle   . Diabetes Paternal Aunt    Past Surgical History:  Procedure Laterality Date  . RHINOPLASTY    . SINUS ENDO W/FUSION    . TONSILLECTOMY      Current Outpatient Medications:  .  alendronate (FOSAMAX) 70 MG tablet, Take 1 tablet (70 mg total) by mouth every 7 (seven) days. Take with a full glass of water on an empty stomach., Disp: 12 tablet, Rfl: 3 .  ALPRAZolam (XANAX) 0.5 MG tablet, Take 1 mg by mouth at bedtime as needed for sleep. , Disp: , Rfl:  .  ARIPiprazole (ABILIFY) 5 MG tablet, Take 2.5 mg by mouth daily.  , Disp: , Rfl:  .  calcium citrate-vitamin D (CITRACAL+D) 315-200 MG-UNIT per tablet, Take 2 tablets by mouth daily., Disp: , Rfl:  .  cholecalciferol (VITAMIN D) 1000 UNITS tablet, Take 1,000 Units by mouth daily., Disp: , Rfl:  .  Cranberry 1000 MG CAPS, Take 1 capsule by mouth every evening. , Disp: , Rfl:  .  ibuprofen (ADVIL,MOTRIN) 200 MG tablet, Take 400 mg by mouth every 6 (six) hours as needed for moderate pain., Disp: , Rfl:  .  PARoxetine (PAXIL) 20 MG tablet, Take 80  mg by mouth at bedtime. , Disp: , Rfl:  .  topiramate (TOPAMAX) 100 MG tablet, Take 100 mg by mouth at bedtime. , Disp: , Rfl:  .  traZODone (DESYREL) 25 mg TABS, Take 25-50 mg by mouth at bedtime as needed for sleep. , Disp: , Rfl:  .  zolpidem (AMBIEN) 5 MG tablet, Take 5 mg by mouth at bedtime as needed for sleep., Disp: , Rfl:  .  potassium chloride SA (K-DUR,KLOR-CON) 20 MEQ tablet, TAKE 1 TABLET (20 MEQ) BY MOUTH DAILY. (Patient not taking: Reported on 06/27/2017), Disp: 90 tablet, Rfl: 1  BP 102/64 (BP Location: Left Arm, Patient Position: Sitting, Cuff Size: Normal)   Pulse 88   Temp 97.8 F (36.6 C) (Oral)   Ht 5' 3.5" (1.613 m)   Wt 189 lb 2 oz (85.8 kg)   SpO2 96%   BMI 32.98 kg/m  Gen- awake, alert, appears stated age HEENT: MMM Heart- RRR, no murmurs, +LE edema 2+ pitting up to knees, tapering at prox 1/3 of tibia Lungs- CTAB, normal effort w/o accessory muscle use MSK- no calf pain Psych: Age appropriate judgment and insight  Bilateral leg edema - Plan: CBC w/Diff, TSH, Comprehensive metabolic panel  Orders as above. Elevate legs, stay active, mind salt intake.  Likely  dependent edema.  F/u prn. Pt voiced understanding and agreement to the plan.  [place], DO 06/27/17  3:27 PM

## 2017-06-27 NOTE — Patient Instructions (Addendum)
1-2 days to get the results of your labs.  Elevate legs when you are sitting or lying down.   Keep moving and stay physically active.  Mind the salt intake.   Let us know if you need anything.

## 2017-06-27 NOTE — Addendum Note (Signed)
Addended by: Ames Coupe on: 06/27/2017 04:00 PM   Modules accepted: Orders

## 2017-06-28 LAB — CBC WITH DIFFERENTIAL/PLATELET
Basophils Absolute: 0 10*3/uL (ref 0.0–0.1)
Basophils Relative: 0.6 % (ref 0.0–3.0)
EOS ABS: 0.2 10*3/uL (ref 0.0–0.7)
Eosinophils Relative: 4.2 % (ref 0.0–5.0)
HCT: 42.1 % (ref 36.0–46.0)
HEMOGLOBIN: 14.3 g/dL (ref 12.0–15.0)
Lymphocytes Relative: 31.9 % (ref 12.0–46.0)
Lymphs Abs: 1.7 10*3/uL (ref 0.7–4.0)
MCHC: 34 g/dL (ref 30.0–36.0)
MCV: 90.6 fl (ref 78.0–100.0)
MONO ABS: 0.5 10*3/uL (ref 0.1–1.0)
Monocytes Relative: 9.6 % (ref 3.0–12.0)
NEUTROS PCT: 53.7 % (ref 43.0–77.0)
Neutro Abs: 2.8 10*3/uL (ref 1.4–7.7)
Platelets: 242 10*3/uL (ref 150.0–400.0)
RBC: 4.65 Mil/uL (ref 3.87–5.11)
RDW: 13.5 % (ref 11.5–15.5)
WBC: 5.2 10*3/uL (ref 4.0–10.5)

## 2017-06-28 LAB — COMPREHENSIVE METABOLIC PANEL
ALK PHOS: 93 U/L (ref 39–117)
ALT: 19 U/L (ref 0–35)
AST: 19 U/L (ref 0–37)
Albumin: 3.6 g/dL (ref 3.5–5.2)
BUN: 17 mg/dL (ref 6–23)
CO2: 30 meq/L (ref 19–32)
Calcium: 9 mg/dL (ref 8.4–10.5)
Chloride: 106 mEq/L (ref 96–112)
Creatinine, Ser: 0.92 mg/dL (ref 0.40–1.20)
GFR: 69.04 mL/min (ref >60.00)
GLUCOSE: 90 mg/dL (ref 70–99)
Potassium: 4.1 mEq/L (ref 3.5–5.1)
SODIUM: 141 meq/L (ref 135–145)
TOTAL PROTEIN: 5.7 g/dL — AB (ref 6.0–8.3)
Total Bilirubin: 0.2 mg/dL (ref 0.2–1.2)

## 2017-06-28 LAB — TSH: TSH: 1.5 u[IU]/mL (ref 0.35–4.50)

## 2017-07-15 MED FILL — ZOLPIDEM TARTRATE 10 MG TAB: 10 | 30 days supply | Qty: 30 | Fill #0

## 2017-07-15 MED FILL — ALPRAZolam ER 0.5 MG TB24: 0.5 | 30 days supply | Qty: 90 | Fill #0

## 2017-07-19 MED FILL — PARoxetine HCL 40 MG TABS: 40 | 30 days supply | Qty: 60 | Fill #1

## 2017-08-10 ENCOUNTER — Encounter: Payer: Self-pay | Admitting: Physician Assistant

## 2017-08-17 NOTE — Progress Notes (Signed)
[facility] [address] [place], [place] [address] [phone]  Date:  08/18/2017   Name:  Brooke [name]   DOB:  01-14-1971   MRN:  [id number]  PCP:  Ann Held, DO    Chief Complaint: Edema (Still having bilateral leg edema. )   History of Present Illness:  Brooke [name] is a 47 y.o. very pleasant female patient who presents with the following:  Pt of Dr. Etter Sjogren here today with concern of swelling in her lower legs for a couple of months.   Her ankles feel like they ache, worse when she sits for a long period or stands for a long time witout walking around If she is up and moving around they look better.   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 She has not worked that much recently   She is here today with her husband Brooke [name] who does much of the talking today.   She often looks to him for answers to my questions   Patient Active Problem List   Diagnosis Date Noted  . Poison ivy dermatitis 08/22/2015  . Acute bacterial sinusitis 07/30/2015  . Morbid obesity ([place]) 03/17/2015  . Preventative health care 02/19/2014  . Recurrent UTI 02/06/2014  . Osteoporosis, unspecified 01/10/2014  . Hyperglycemia 03/05/2013  . Urinary frequency 09/04/2012  . Sore throat 03/01/2012  . Sleep disturbance 12/13/2011  . Back pain 12/13/2011  . FEMALE STRESS INCONTINENCE 09/05/2009  . DEPRESSION 05/31/2008  . EATING DISORDER 08/23/2006    Past Medical History:  Diagnosis Date  . Anxiety   . Depression   . Hyperglycemia 03/05/2013  . Incontinence   . Lumbar  stress fracture   . Osteoporosis     Past Surgical History:  Procedure Laterality Date  . RHINOPLASTY    . SINUS ENDO W/FUSION    . TONSILLECTOMY      Social History   Tobacco Use  . Smoking status: Never Smoker  . Smokeless tobacco: Never Used  Substance Use Topics  . Alcohol use: No    Alcohol/week: 0.0 oz  . Drug use: No    Family History  Problem Relation Age of Onset  . Thyroid disease Mother   . Hyperlipidemia Mother   . Diabetes Father   . Hypertension Father   . Hyperlipidemia Father   . Diabetes Paternal Grandmother   . Hypertension Paternal Grandmother   . Hyperlipidemia Paternal Grandmother   . Mental illness Sister   . Hypertension Paternal Uncle   . Diabetes Paternal Aunt     No Known Allergies  Medication list has been reviewed and updated.  Current Outpatient Medications on File Prior to Visit  Medication Sig Dispense Refill  . alendronate (FOSAMAX) 70 MG tablet Take 1 tablet (70 mg total) by mouth every 7 (seven) days. Take with a full glass of water on an empty stomach. 12 tablet 3  . ALPRAZolam (XANAX) 0.5 MG tablet Take 1 mg by mouth at bedtime as needed  for sleep.     . ARIPiprazole (ABILIFY) 5 MG tablet Take 2.5 mg by mouth daily.      . calcium citrate-vitamin D (CITRACAL+D) 315-200 MG-UNIT per tablet Take 2 tablets by mouth daily.    . cholecalciferol (VITAMIN D) 1000 UNITS tablet Take 1,000 Units by mouth daily.    . Cranberry 1000 MG CAPS Take 1 capsule by mouth every evening.     Marland Kitchen ibuprofen (ADVIL,MOTRIN) 200 MG tablet Take 400 mg by mouth every 6 (six) hours as needed for moderate pain.    Marland Kitchen ketoconazole (NIZORAL) 2 % cream Apply to feet daily for 6 weeks. 60 g 0  . PARoxetine (PAXIL) 20 MG tablet Take 80 mg by mouth at bedtime.     . topiramate (TOPAMAX) 100 MG tablet Take 100 mg by mouth at bedtime.     . traZODone (DESYREL) 25 mg TABS Take 25-50 mg by mouth at bedtime as needed for sleep.     Marland Kitchen zolpidem (AMBIEN) 5 MG tablet Take 5  mg by mouth at bedtime as needed for sleep.     No current facility-administered medications on file prior to visit.     Review of Systems:  As per HPI- otherwise negative.    Physical Examination: Vitals:   08/18/17 1341  BP: 117/81  Pulse: 89  Temp: 98.6 F (37 C)  SpO2: 100%   Vitals:   08/18/17 1341  Weight: 189 lb 6.4 oz (85.9 kg)  Height: 5\' 3"  (1.6 m)   Body mass index is 33.55 kg/m. Ideal Body Weight: Weight in (lb) to have BMI = 25: 140.8  GEN: WDWN, NAD, Non-toxic, A & O x 3, obese, looks well  HEENT: Atraumatic, Normocephalic. Neck supple. No masses, No LAD. Ears and Nose: No external deformity. CV: RRR, No M/G/R. No JVD. No thrill. No extra heart sounds. PULM: CTA B, no wheezes, crackles, rhonchi. No retractions. No resp. distress. No accessory muscle use. ABD: S, NT, ND. No rebound. No HSM. EXTR: No c/c. Trace edema of both legs at the distal medial tib. Very minor. Normal DP pulse bilaterally  NEURO Normal gait.  PSYCH: Normally interactive. Conversant. Not depressed or anxious appearing.  Calm demeanor.   Pt states that he rankles can often be a lot worse than they are today  Wt Readings from Last 3 Encounters:  08/18/17 189 lb 6.4 oz (85.9 kg)  06/27/17 189 lb 2 oz (85.8 kg)  06/15/17 185 lb 4 oz (84 kg)    No sudden weight change to suggest CHF Assessment and Plan: Venous insufficiency - Plan: hydrochlorothiazide (HYDRODIURIL) 12.5 MG tablet  Pedal edema - Plan: hydrochlorothiazide (HYDRODIURIL) 12.5 MG tablet  Medication monitoring encounter - Plan: Basic metabolic panel  Treat for likely venous insuf with supportive measures, compression. Also hctz to use as needed.   Nice to see you today You likely have venous insufficieny, which just means that your veins allow some of the fluid portion of blood to leak into the soft tissues of your legs, causing swelling.  We will give you some hydrochlorothiazide to use as needed for swelling Things  that can also help-   Elevate your legs when you can Wear compression sock (knee high) during the day when possible Take a brisk walk if you have swelling in your legs during the day  We will give you a fluid pill to use as needed when you have swelling Please come by for a blood test to check your potassium level (  lab visit only) in 2-3 weeks.     Signed Lamar Blinks, MD

## 2017-08-18 ENCOUNTER — Ambulatory Visit (INDEPENDENT_AMBULATORY_CARE_PROVIDER_SITE_OTHER): Payer: No Typology Code available for payment source | Admitting: Family Medicine

## 2017-08-18 ENCOUNTER — Encounter: Payer: Self-pay | Admitting: Family Medicine

## 2017-08-18 VITALS — BP 117/81 | HR 89 | Temp 98.6°F | Ht 63.0 in | Wt 189.4 lb

## 2017-08-18 DIAGNOSIS — I872 Venous insufficiency (chronic) (peripheral): Secondary | ICD-10-CM | POA: Diagnosis not present

## 2017-08-18 DIAGNOSIS — Z5181 Encounter for therapeutic drug level monitoring: Secondary | ICD-10-CM

## 2017-08-18 DIAGNOSIS — R600 Localized edema: Secondary | ICD-10-CM | POA: Diagnosis not present

## 2017-08-18 MED ORDER — HYDROCHLOROTHIAZIDE 12.5 MG PO TABS: ORAL_TABLET | ORAL | 3 refills | Status: DC

## 2017-08-18 MED FILL — HYDROCHLOROTHIAZIDE 12.5 MG: 12.5 | 30 days supply | Qty: 60 | Fill #0

## 2017-08-18 NOTE — Patient Instructions (Addendum)
Nice to see you today You likely have venous insufficieny, which just means that your veins allow some of the fluid portion of blood to leak into the soft tissues of your legs, causing swelling.  We will give you some hydrochlorothiazide to use as needed for swelling Things that can also help-   Elevate your legs when you can Wear compression sock (knee high) during the day when possible Take a brisk walk if you have swelling in your legs during the day  We will give you a fluid pill to use as needed when you have swelling Please come by for a blood test to check your potassium level (lab visit only) in 2-3 weeks.

## 2017-08-26 MED FILL — PARoxetine HCL 40 MG TABS: 40 | 30 days supply | Qty: 60 | Fill #2

## 2017-09-09 MED FILL — ARIPiprazole 2 MG TABS: 2 | 90 days supply | Qty: 90 | Fill #0

## 2017-09-15 IMAGING — DX DG HIP (WITH OR WITHOUT PELVIS) 2-3V*R*
3 series · 3 of 3 positions shown · non-contrast
Comparison: None.

CLINICAL DATA: Pain for 3 months pain.  No history of trauma

EXAM:
DG HIP (WITH OR WITHOUT PELVIS) 2-3V RIGHT

[pelvis ap]
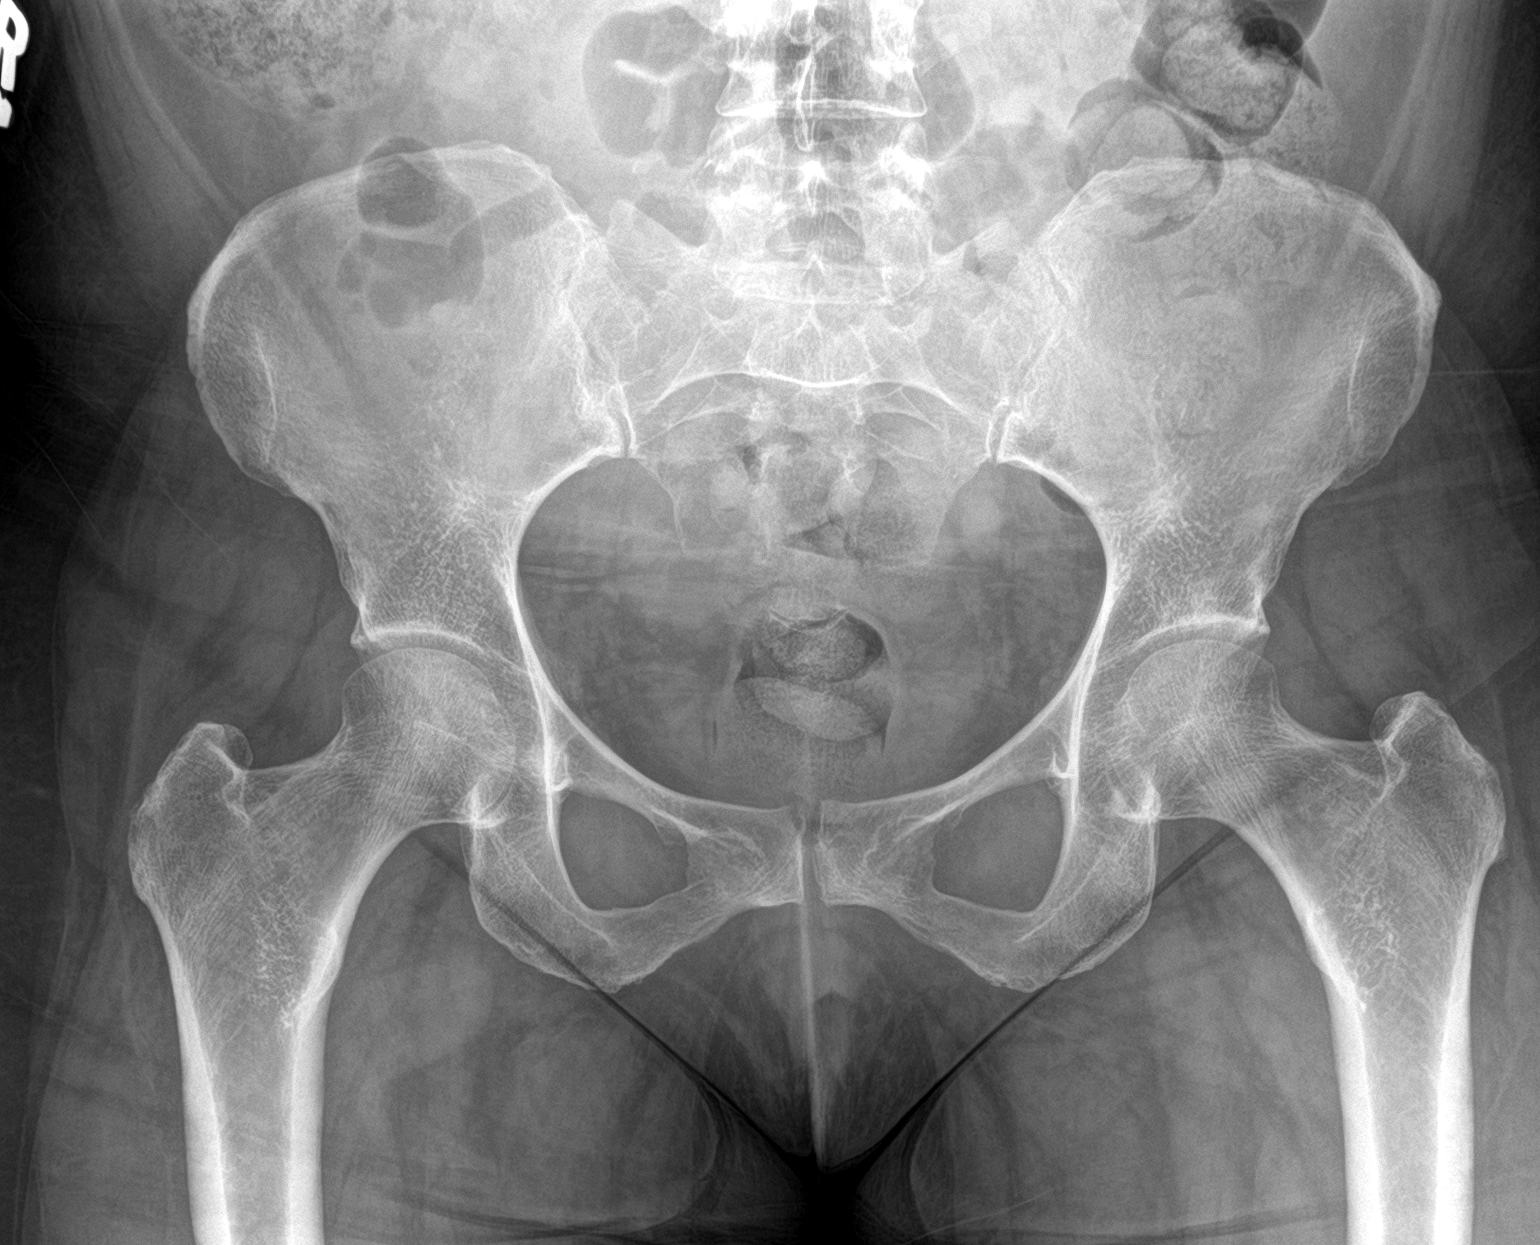

[hip ap]
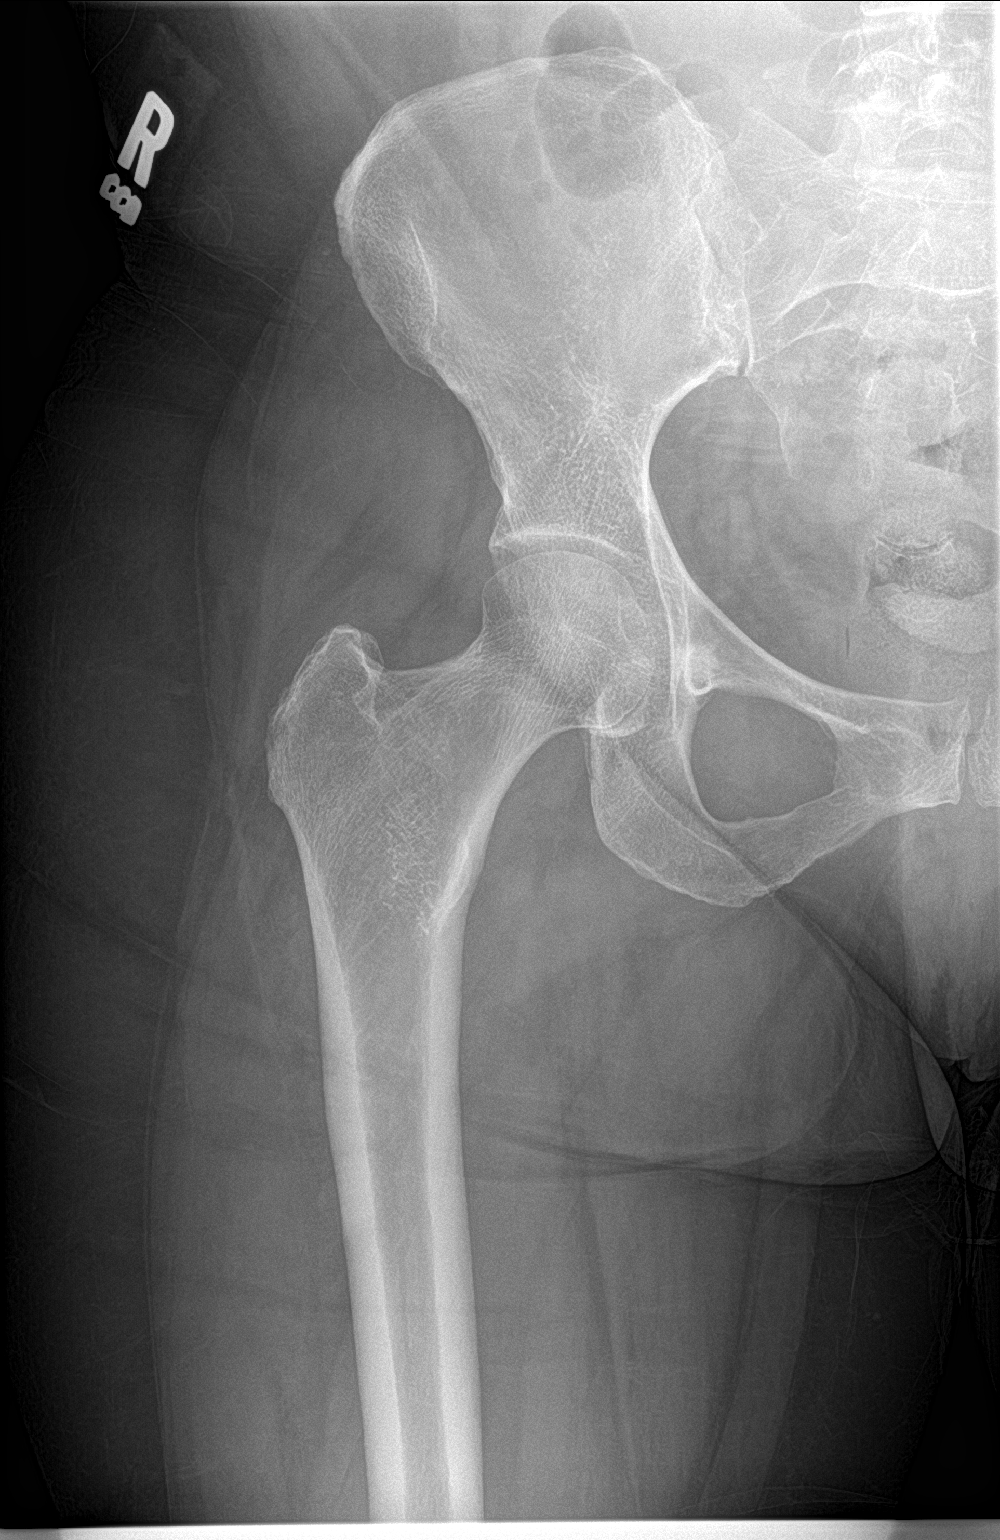

[hip lat]
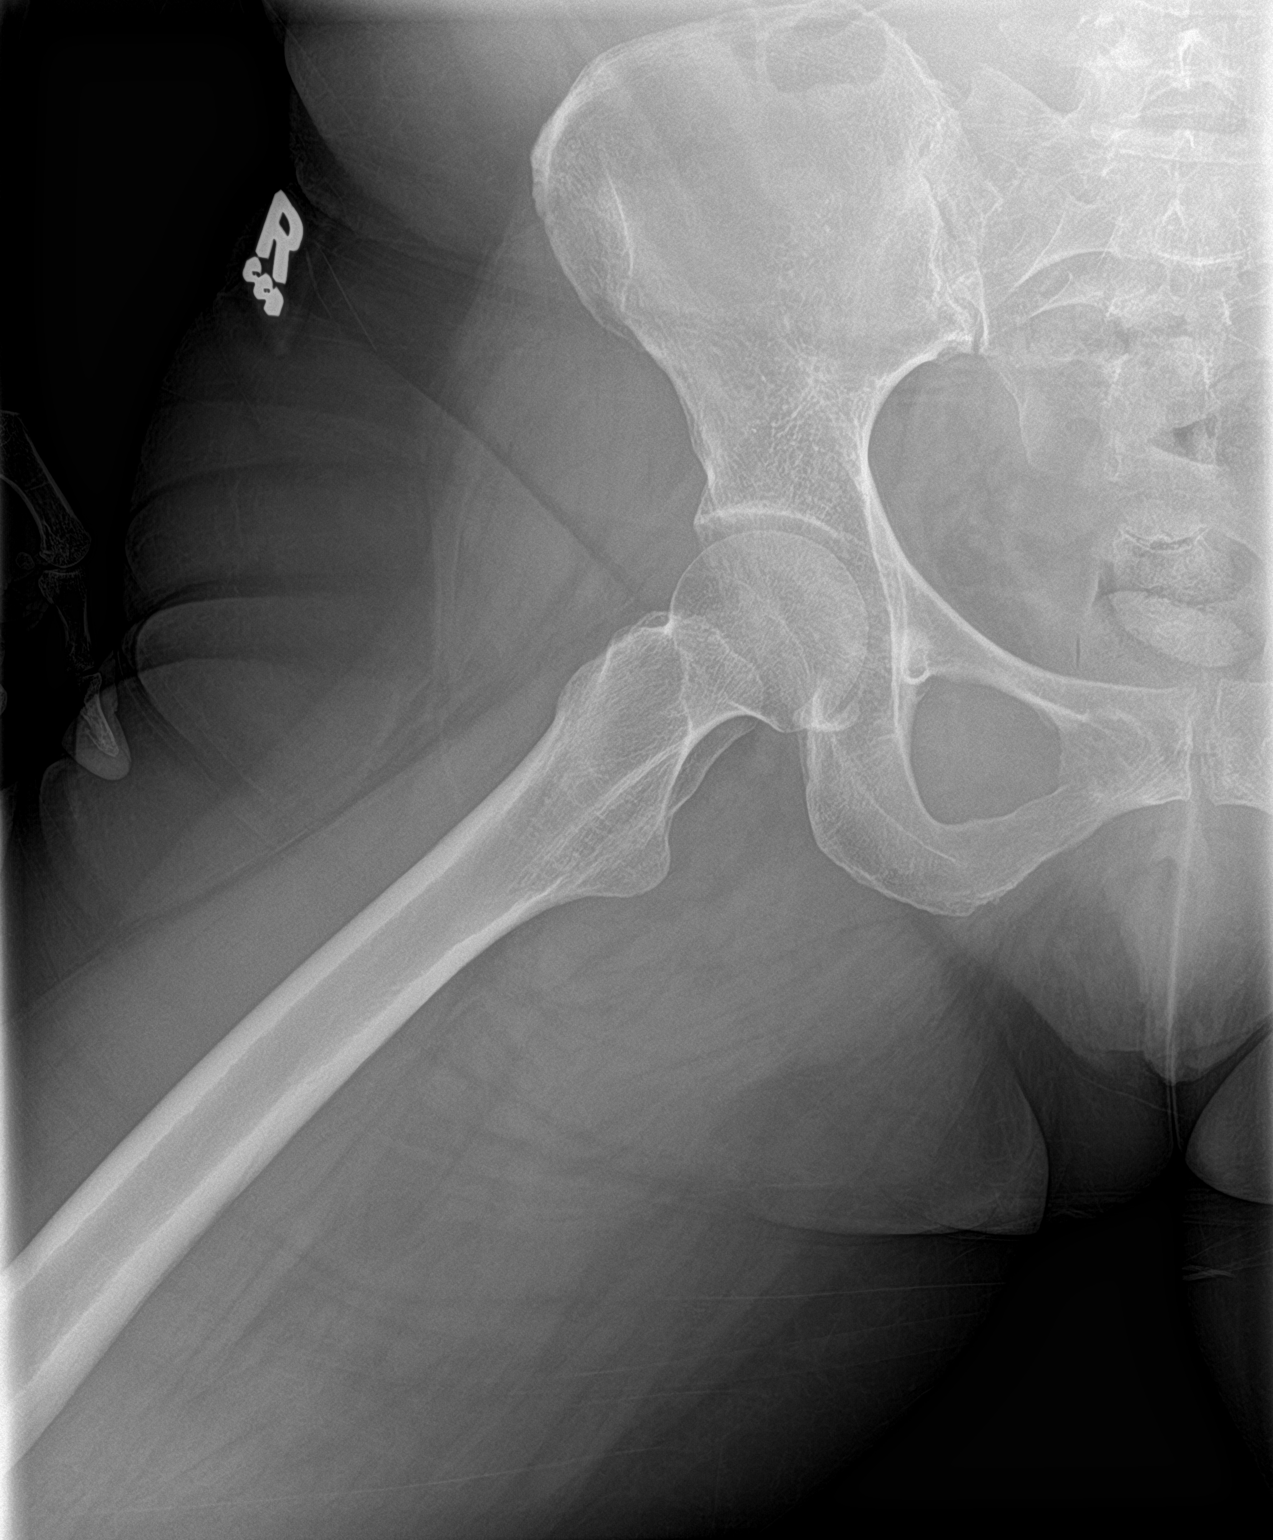

[3 of 3 positions shown; findings below may reference images not displayed]

FINDINGS: Frontal pelvis as well as frontal and lateral right hip images were
obtained. There is no fracture or dislocation. The joint spaces
appear normal. No erosive change.
IMPRESSION: No fracture or dislocation.  No evident arthropathy.

## 2017-09-29 ENCOUNTER — Ambulatory Visit (INDEPENDENT_AMBULATORY_CARE_PROVIDER_SITE_OTHER): Payer: No Typology Code available for payment source

## 2017-09-29 ENCOUNTER — Encounter (HOSPITAL_COMMUNITY): Payer: Self-pay | Admitting: Family Medicine

## 2017-09-29 ENCOUNTER — Ambulatory Visit (HOSPITAL_COMMUNITY)
Admission: EM | Admit: 2017-09-29 | Discharge: 2017-09-29 | Disposition: A | Discharge status: Home / Self Care | Payer: No Typology Code available for payment source | Attending: Family Medicine | Admitting: Family Medicine

## 2017-09-29 DIAGNOSIS — W208XXA Other cause of strike by thrown, projected or falling object, initial encounter: Secondary | ICD-10-CM | POA: Diagnosis not present

## 2017-09-29 DIAGNOSIS — S9032XA Contusion of left foot, initial encounter: Secondary | ICD-10-CM

## 2017-09-29 DIAGNOSIS — M79672 Pain in left foot: Secondary | ICD-10-CM | POA: Diagnosis not present

## 2017-09-29 MED ORDER — NAPROXEN 375 MG PO TABS: 375.0000 mg | ORAL_TABLET | Freq: Two times a day (BID) | ORAL | 0 refills | Status: DC | PRN

## 2017-09-29 MED FILL — ALPRAZolam ER 0.5 MG TB24: 0.5 | 30 days supply | Qty: 90 | Fill #1

## 2017-09-29 MED FILL — NAPROXEN 375 MG TABLET: 375 | 10 days supply | Qty: 20 | Fill #0

## 2017-09-29 MED FILL — PARoxetine HCL 40 MG TABS: 40 | 30 days supply | Qty: 60 | Fill #3

## 2017-09-29 NOTE — ED Triage Notes (Signed)
Pt here for left foot pain and injury. She reports last night she dropped a glass jar of spaghetti on it. Some redness to foot. No significant swelling.

## 2017-09-29 NOTE — ED Provider Notes (Signed)
[place]   [id number] 09/29/17 Arrival Time: 2426  SUBJECTIVE: History from: patient. Brooke [name] is a 47 y.o. female hx significant for OP complains of left foot pain that began 1 day ago.  States she dropped a glass jar of spaghetti sauce on her foot.  Localizes the pain to the top outside of foot.  Describes the pain as constant and achy in character.  Has tried OTC medications without relief.  Mild pain with walking.  Denies similar symptoms in the past.  Complains of mild erythema and swelling.  Denies fever, chills, ecchymosis, weakness, numbness and tingling.      ROS: As per HPI.  Past Medical History:  Diagnosis Date  . Anxiety   . Depression   . Hyperglycemia 03/05/2013  . Incontinence   . Lumbar stress fracture   . Osteoporosis    Past Surgical History:  Procedure Laterality Date  . RHINOPLASTY    . SINUS ENDO W/FUSION    . TONSILLECTOMY     No Known Allergies No current facility-administered medications on file prior to encounter.    Current Outpatient Medications on File Prior to Encounter  Medication Sig Dispense Refill  . alendronate (FOSAMAX) 70 MG tablet Take 1 tablet (70 mg total) by mouth every 7 (seven) days. Take with a full glass of water on an empty stomach. 12 tablet 3  . ALPRAZolam (XANAX) 0.5 MG tablet Take 1 mg by mouth at bedtime as needed for sleep.     . ARIPiprazole (ABILIFY) 5 MG tablet Take 2.5 mg by mouth daily.      . calcium citrate-vitamin D (CITRACAL+D) 315-200 MG-UNIT per tablet Take 2 tablets by mouth daily.    . cholecalciferol (VITAMIN D) 1000 UNITS tablet Take 1,000 Units by mouth daily.    . Cranberry 1000 MG CAPS Take 1 capsule by mouth every evening.     . hydrochlorothiazide (HYDRODIURIL) 12.5 MG tablet Take 1 or 2 daily as needed for swelling in your legs 60 tablet 3  . ibuprofen (ADVIL,MOTRIN) 200 MG tablet Take 400 mg by mouth every 6 (six) hours as needed for moderate pain.    Marland Kitchen ketoconazole (NIZORAL) 2 %  cream Apply to feet daily for 6 weeks. 60 g 0  . PARoxetine (PAXIL) 20 MG tablet Take 80 mg by mouth at bedtime.     . topiramate (TOPAMAX) 100 MG tablet Take 100 mg by mouth at bedtime.     . traZODone (DESYREL) 25 mg TABS Take 25-50 mg by mouth at bedtime as needed for sleep.     Marland Kitchen zolpidem (AMBIEN) 5 MG tablet Take 5 mg by mouth at bedtime as needed for sleep.     Social History   Socioeconomic History  . Marital status: Married    Spouse name: Not on file  . Number of children: Not on file  . Years of education: 43  . Highest education level: Not on file  Occupational History  . Occupation: RN/OR    Employer: [employer]  . Financial resource strain: Not on file  . Food insecurity:    Worry: Not on file    Inability: Not on file  . Transportation needs:    Medical: Not on file    Non-medical: Not on file  Tobacco Use  . Smoking status: Never Smoker  . Smokeless tobacco: Never Used  Substance and Sexual Activity  . Alcohol use: No    Alcohol/week: 0.0 oz  . Drug use:  No  . Sexual activity: Not on file  Lifestyle  . Physical activity:    Days per week: Not on file    Minutes per session: Not on file  . Stress: Not on file  Relationships  . Social connections:    Talks on phone: Not on file    Gets together: Not on file    Attends religious service: Not on file    Active member of club or organization: Not on file    Attends meetings of clubs or organizations: Not on file    Relationship status: Not on file  . Intimate partner violence:    Fear of current or ex partner: Not on file    Emotionally abused: Not on file    Physically abused: Not on file    Forced sexual activity: Not on file  Other Topics Concern  . Not on file  Social History Narrative  . Not on file   Family History  Problem Relation Age of Onset  . Thyroid disease Mother   . Hyperlipidemia Mother   . Diabetes Father   . Hypertension Father   . Hyperlipidemia Father   .  Diabetes Paternal Grandmother   . Hypertension Paternal Grandmother   . Hyperlipidemia Paternal Grandmother   . Mental illness Sister   . Hypertension Paternal Uncle   . Diabetes Paternal Aunt     OBJECTIVE:  Vitals:   09/29/17 1445  BP: 107/75  Pulse: (!) 106  Resp: 18  Temp: 98.1 F (36.7 C)  SpO2: 98%    General appearance: AOx3; in no acute distress.  Head: NCAT Lungs: CTA bilaterally Heart: RRR.  Clear S1 and S2 without murmur, gallops, or rubs.  Radial pulses 2+ bilaterally. Musculoskeletal: Left foot Inspection: Skin warm, dry, clear and intact without obvious effusion, or ecchymosis. Mild erythema Palpation: Tender to palpation about the fifth midshaft MT, nontender about the fifth proximal MT ROM: FROM active and passive Strength:  5/5 knee flexion, 5/5 knee extension, 5/5 dorsiflexion, 5/5 plantar flexion CV: Dorsalis pedis pulse 2+ bilaterally; cap refill <3 secs of the toe Skin: warm and dry Neurologic: Ambulates without difficulty; Sensation intact about the lower extremities Psychological: alert and cooperative; normal mood and affect  DIAGNOSTICS:  CLINICAL DATA: Patient dropped a can of spaghetti on the LEFT foot last night. Pain in the LEFT foot comminuted 4th and 5th metatarsal region.  EXAM: LEFT FOOT - COMPLETE 3+ VIEW  COMPARISON: None.  FINDINGS: There is soft tissue swelling along the LATERAL aspect of the midfoot. No acute fracture or subluxation. No radiopaque foreign body or soft tissue gas.  IMPRESSION: Soft tissue swelling without acute fracture.   Electronically Signed By: Nolon Nations M.D. On: 09/29/2017 15:16  X-rays negative for bony abnormalities including fracture, or dislocation.  I have reviewed the x-rays and the radiologist interpretation. I am in agreement with the radiologist interpretation.    ASSESSMENT & PLAN:  1. Contusion of left foot, initial encounter     No orders of the defined types were  placed in this encounter.   Continue conservative management of rest, ice, and elevate Take naproxen as needed for pain relief (may cause abdominal discomfort, ulcers, and GI bleeds avoid taking with other NSAIDs) Follow up with PCP if symptoms persist Present to ER if worsening or new symptoms (fever, chills, chest pain, abdominal pain, changes in bowel or bladder habits, pain radiating into lower legs, etc...)    Reviewed expectations re: course of current medical issues. Questions  answered. Outlined signs and symptoms indicating need for more acute intervention. Patient verbalized understanding. After Visit Summary given.    Lestine Box, PA-C 09/29/17 2103

## 2017-09-29 NOTE — Discharge Instructions (Addendum)
Continue conservative management of rest, ice, and elevate Take naproxen as needed for pain relief (may cause abdominal discomfort, ulcers, and GI bleeds avoid taking with other NSAIDs) Follow up with PCP if symptoms persist Present to ER if worsening or new symptoms (fever, chills, chest pain, abdominal pain, changes in bowel or bladder habits, pain radiating into lower legs, etc...)

## 2017-10-07 MED FILL — TOPIRAMATE 50 MG TABLET: 50 | 90 days supply | Qty: 270 | Fill #0

## 2017-10-20 MED FILL — ZOLPIDEM TARTRATE 10 MG TAB: 10 | 30 days supply | Qty: 30 | Fill #1

## 2017-11-02 MED FILL — PARoxetine HCL 40 MG TABS: 40 | 30 days supply | Qty: 60 | Fill #4

## 2017-11-08 MED FILL — HYDROCHLOROTHIAZIDE 12.5 MG: 12.5 | 30 days supply | Qty: 60 | Fill #1

## 2017-11-08 MED FILL — ALPRAZolam ER 0.5 MG TB24: 0.5 | 30 days supply | Qty: 90 | Fill #2

## 2017-12-13 MED FILL — PARoxetine HCL 40 MG TABS: 40 | 30 days supply | Qty: 60 | Fill #5

## 2017-12-21 MED FILL — ARIPiprazole 2 MG TABS: 2 | 90 days supply | Qty: 90 | Fill #1

## 2018-01-06 ENCOUNTER — Ambulatory Visit (HOSPITAL_COMMUNITY)
Admission: EM | Admit: 2018-01-06 | Discharge: 2018-01-06 | Disposition: A | Discharge status: Home / Self Care | Payer: No Typology Code available for payment source | Attending: Internal Medicine | Admitting: Internal Medicine

## 2018-01-06 ENCOUNTER — Encounter (HOSPITAL_COMMUNITY): Payer: Self-pay

## 2018-01-06 DIAGNOSIS — B9789 Other viral agents as the cause of diseases classified elsewhere: Secondary | ICD-10-CM

## 2018-01-06 DIAGNOSIS — J029 Acute pharyngitis, unspecified: Secondary | ICD-10-CM | POA: Diagnosis present

## 2018-01-06 DIAGNOSIS — R05 Cough: Secondary | ICD-10-CM | POA: Diagnosis present

## 2018-01-06 DIAGNOSIS — J069 Acute upper respiratory infection, unspecified: Secondary | ICD-10-CM

## 2018-01-06 DIAGNOSIS — Z79899 Other long term (current) drug therapy: Secondary | ICD-10-CM | POA: Diagnosis not present

## 2018-01-06 LAB — POCT RAPID STREP A: STREPTOCOCCUS, GROUP A SCREEN (DIRECT): NEGATIVE

## 2018-01-06 MED ORDER — CETIRIZINE HCL 10 MG PO CAPS: 10.0000 mg | ORAL_CAPSULE | Freq: Every day | ORAL | 0 refills | Status: DC

## 2018-01-06 MED ORDER — PSEUDOEPH-BROMPHEN-DM 30-2-10 MG/5ML PO SYRP: 5.0000 mL | ORAL_SOLUTION | Freq: Four times a day (QID) | ORAL | 0 refills | Status: DC | PRN

## 2018-01-06 NOTE — Discharge Instructions (Signed)
Sore Throat  Your rapid strep tested Negative today. We will send for a culture and call in about 2 days if results are positive. For now we will treat your sore throat as a virus with symptom management.   Please continue Tylenol or Ibuprofen for fever and pain. May try salt water gargles, cepacol lozenges, throat spray, or OTC cold relief medicine for throat discomfort. If you also have congestion take a daily anti-histamine like Zyrtec, Claritin, and a oral decongestant to help with post nasal drip that may be irritating your throat.   Stay hydrated and drink plenty of fluids to keep your throat coated relieve irritation.   Please use cough syrup provided to help with cough and congestion  Please follow-up if symptoms worsening, not improving in 1 week as expected, developing fever, shortness of breath, chest discomfort, worsening the symptoms

## 2018-01-06 NOTE — ED Triage Notes (Signed)
Pt presents with a sore throat and nasal congestion x 3 days. States it feels like it is hard to swallow. Denies fevers at home.

## 2018-01-07 NOTE — ED Provider Notes (Signed)
[address]    CSN: [id number] Arrival date & time: 01/06/18  1837     History   Chief Complaint Chief Complaint  Patient presents with  . Sore Throat    HPI Brooke [name] is a 47 y.o. female Patient is presenting with URI symptoms- congestion, cough, sore throat.  Cough is been productive with phlegm, occasionally streaked with blood.  Noted a couple nosebleeds last night.  Patient's main complaints are sore throat, sensation of something being stuck in her throat.  Symptoms have been going on for 3 days. Patient has tried Flonase, with minimal relief. Denies fever, nausea, vomiting, diarrhea. Denies shortness of breath and chest pain.  Denies history of asthma and smoking.  HPI  Past Medical History:  Diagnosis Date  . Anxiety   . Depression   . Hyperglycemia 03/05/2013  . Incontinence   . Lumbar stress fracture   . Osteoporosis     Patient Active Problem List   Diagnosis Date Noted  . Poison ivy dermatitis 08/22/2015  . Acute bacterial sinusitis 07/30/2015  . Morbid obesity ([place]) 03/17/2015  . Preventative health care 02/19/2014  . Recurrent UTI 02/06/2014  . Osteoporosis, unspecified 01/10/2014  . Hyperglycemia 03/05/2013  . Urinary frequency 09/04/2012  . Sore throat 03/01/2012  . Sleep disturbance 12/13/2011  . Back pain 12/13/2011  . FEMALE STRESS INCONTINENCE 09/05/2009  . DEPRESSION 05/31/2008  . EATING DISORDER 08/23/2006    Past Surgical History:  Procedure Laterality Date  . RHINOPLASTY    . SINUS ENDO W/FUSION    . TONSILLECTOMY      OB History   None      Home Medications    Prior to Admission medications   Medication Sig Start Date End Date Taking? Authorizing Provider  alendronate (FOSAMAX) 70 MG tablet Take 1 tablet (70 mg total) by mouth every 7 (seven) days. Take with a full glass of water on an empty stomach. 01/06/16   Ann Held, DO  ALPRAZolam Duanne Moron) 0.5 MG tablet Take 1 mg by mouth at bedtime as needed  for sleep.     [provider]  ARIPiprazole (ABILIFY) 5 MG tablet Take 2.5 mg by mouth daily.      [provider]  brompheniramine-pseudoephedrine-DM 30-2-10 MG/5ML syrup Take 5 mLs by mouth 4 (four) times daily as needed. 01/06/18   Wieters, Hallie C, PA-C  calcium citrate-vitamin D (CITRACAL+D) 315-200 MG-UNIT per tablet Take 2 tablets by mouth daily.    [provider]  Cetirizine HCl 10 MG CAPS Take 1 capsule (10 mg total) by mouth daily for 10 days. 01/06/18 01/16/18  Wieters, Hallie C, PA-C  cholecalciferol (VITAMIN D) 1000 UNITS tablet Take 1,000 Units by mouth daily.    [provider]  Cranberry 1000 MG CAPS Take 1 capsule by mouth every evening.     [provider]  hydrochlorothiazide (HYDRODIURIL) 12.5 MG tablet Take 1 or 2 daily as needed for swelling in your legs 08/18/17   Copland, Gay Filler, MD  ibuprofen (ADVIL,MOTRIN) 200 MG tablet Take 400 mg by mouth every 6 (six) hours as needed for moderate pain.    [provider]  ketoconazole (NIZORAL) 2 % cream Apply to feet daily for 6 weeks. 06/27/17   Shelda Pal, DO  naproxen (NAPROSYN) 375 MG tablet Take 1 tablet (375 mg total) by mouth 2 (two) times daily as needed for moderate pain. 09/29/17   Wurst, [place], PA-C  PARoxetine (PAXIL) 20 MG tablet Take  80 mg by mouth at bedtime.     [provider]  topiramate (TOPAMAX) 100 MG tablet Take 100 mg by mouth at bedtime.     [provider]  traZODone (DESYREL) 25 mg TABS Take 25-50 mg by mouth at bedtime as needed for sleep.     [provider]  zolpidem (AMBIEN) 5 MG tablet Take 5 mg by mouth at bedtime as needed for sleep.    [provider]    Family History Family History  Problem Relation Age of Onset  . Thyroid disease Mother   . Hyperlipidemia Mother   . Diabetes Father   . Hypertension Father   . Hyperlipidemia Father   . Diabetes Paternal Grandmother   . Hypertension Paternal  Grandmother   . Hyperlipidemia Paternal Grandmother   . Mental illness Sister   . Hypertension Paternal Uncle   . Diabetes Paternal Aunt     Social History Social History   Tobacco Use  . Smoking status: Never Smoker  . Smokeless tobacco: Never Used  Substance Use Topics  . Alcohol use: No    Alcohol/week: 0.0 standard drinks  . Drug use: No     Allergies   Patient has no known allergies.   Review of Systems Review of Systems  Constitutional: Negative for activity change, appetite change, chills, fatigue and fever.  HENT: Positive for congestion, rhinorrhea and sore throat. Negative for ear pain, sinus pressure and trouble swallowing.   Eyes: Negative for discharge and redness.  Respiratory: Positive for cough. Negative for chest tightness and shortness of breath.   Cardiovascular: Negative for chest pain.  Gastrointestinal: Negative for abdominal pain, diarrhea, nausea and vomiting.  Musculoskeletal: Negative for myalgias.  Skin: Negative for rash.  Neurological: Negative for dizziness, light-headedness and headaches.     Physical Exam Triage Vital Signs ED Triage Vitals  Enc Vitals Group     BP 01/06/18 1922 113/78     Pulse Rate 01/06/18 1922 (!) 104     Resp 01/06/18 1922 18     Temp 01/06/18 1922 98.1 F (36.7 C)     Temp src --      SpO2 01/06/18 1922 100 %     Weight --      Height --      Head Circumference --      Peak Flow --      Pain Score 01/06/18 1923 8     Pain Loc --      Pain Edu? --      Excl. in [place]? --    No data found.  Updated Vital Signs BP 113/78   Pulse (!) 104   Temp 98.1 F (36.7 C)   Resp 18   SpO2 100%   Visual Acuity Right Eye Distance:   Left Eye Distance:   Bilateral Distance:    Right Eye Near:   Left Eye Near:    Bilateral Near:     Physical Exam  Constitutional: She appears well-developed and well-nourished. No distress.  HENT:  Head: Normocephalic and atraumatic.  Bilateral ears without tenderness to  palpation of external auricle, tragus and mastoid, EAC's without erythema or swelling, TM's with good bony landmarks and cone of light. Non erythematous.  Oral mucosa pink and moist, no tonsillar enlargement or exudate. Posterior pharynx patent and erythematous-cobblestoning present, no uvula deviation or swelling. Normal phonation.  Eyes: Conjunctivae are normal.  Neck: Neck supple.  Cardiovascular: Normal rate and regular rhythm.  No murmur heard. Pulmonary/Chest:  Effort normal and breath sounds normal. No respiratory distress.  Breathing comfortably at rest, CTABL, no wheezing, rales or other adventitious sounds auscultated  Abdominal: Soft. There is no tenderness.  Musculoskeletal: She exhibits no edema.  Neurological: She is alert.  Skin: Skin is warm and dry.  Psychiatric: She has a normal mood and affect.  Nursing note and vitals reviewed.    UC Treatments / Results  Labs (all labs ordered are listed, but only abnormal results are displayed) Labs Reviewed  CULTURE, GROUP A STREP [facility])  POCT RAPID STREP A    EKG None  Radiology No results found.  Procedures Procedures (including critical care time)  Medications Ordered in UC Medications - No data to display  Initial Impression / Assessment and Plan / UC Course  I have reviewed the triage vital signs and the nursing notes.  Pertinent labs & imaging results that were available during my care of the patient were reviewed by me and considered in my medical decision making (see chart for details).     Strep test negative, symptoms likely viral versus drainage.  Will recommend to begin daily Zyrtec, cough syrup with decongestant provided to help with discomfort in throat.  Continue to monitor symptoms, further symptomatic management recommendations below.  Follow-up if symptoms worsening, changing, developing new symptoms, not improving as expected in approximately 5 to 7 days.Discussed strict return precautions. Patient  verbalized understanding and is agreeable with plan.  Final Clinical Impressions(s) / UC Diagnoses   Final diagnoses:  Sore throat  Viral URI with cough     Discharge Instructions     Sore Throat  Your rapid strep tested Negative today. We will send for a culture and call in about 2 days if results are positive. For now we will treat your sore throat as a virus with symptom management.   Please continue Tylenol or Ibuprofen for fever and pain. May try salt water gargles, cepacol lozenges, throat spray, or OTC cold relief medicine for throat discomfort. If you also have congestion take a daily anti-histamine like Zyrtec, Claritin, and a oral decongestant to help with post nasal drip that may be irritating your throat.   Stay hydrated and drink plenty of fluids to keep your throat coated relieve irritation.   Please use cough syrup provided to help with cough and congestion  Please follow-up if symptoms worsening, not improving in 1 week as expected, developing fever, shortness of breath, chest discomfort, worsening the symptoms   ED Prescriptions    Medication Sig Dispense Auth. Provider   Cetirizine HCl 10 MG CAPS Take 1 capsule (10 mg total) by mouth daily for 10 days. 10 capsule Wieters, Hallie C, PA-C   brompheniramine-pseudoephedrine-DM 30-2-10 MG/5ML syrup Take 5 mLs by mouth 4 (four) times daily as needed. 120 mL Wieters, Hallie C, PA-C     Controlled Substance Prescriptions [place] Controlled Substance Registry consulted? Not Applicable   Janith Lima, [place] 01/07/18 [phone]

## 2018-01-09 LAB — CULTURE, GROUP A STREP (THRC)

## 2018-01-12 MED FILL — PARoxetine HCL 40 MG TABS: 40 | 30 days supply | Qty: 60 | Fill #0

## 2018-01-20 MED FILL — TOPIRAMATE 50 MG TABLET: 50 | 90 days supply | Qty: 270 | Fill #1

## 2018-02-05 ENCOUNTER — Emergency Department (HOSPITAL_COMMUNITY): Payer: No Typology Code available for payment source

## 2018-02-05 ENCOUNTER — Emergency Department (HOSPITAL_COMMUNITY)
Admission: EM | Admit: 2018-02-05 | Discharge: 2018-02-05 | Disposition: A | Discharge status: Home / Self Care | Payer: No Typology Code available for payment source | Attending: Emergency Medicine | Admitting: Emergency Medicine

## 2018-02-05 ENCOUNTER — Encounter (HOSPITAL_COMMUNITY): Payer: Self-pay

## 2018-02-05 ENCOUNTER — Other Ambulatory Visit: Payer: Self-pay

## 2018-02-05 DIAGNOSIS — M545 Low back pain: Secondary | ICD-10-CM | POA: Diagnosis not present

## 2018-02-05 DIAGNOSIS — R8290 Unspecified abnormal findings in urine: Secondary | ICD-10-CM | POA: Insufficient documentation

## 2018-02-05 DIAGNOSIS — J189 Pneumonia, unspecified organism: Secondary | ICD-10-CM

## 2018-02-05 DIAGNOSIS — R509 Fever, unspecified: Secondary | ICD-10-CM | POA: Diagnosis present

## 2018-02-05 DIAGNOSIS — J181 Lobar pneumonia, unspecified organism: Secondary | ICD-10-CM | POA: Diagnosis not present

## 2018-02-05 LAB — COMPREHENSIVE METABOLIC PANEL
ALBUMIN: 4.6 g/dL (ref 3.5–5.0)
ALK PHOS: 89 U/L (ref 38–126)
ALT: 29 U/L (ref 0–44)
ANION GAP: 9 (ref 5–15)
AST: 26 U/L (ref 15–41)
BUN: 17 mg/dL (ref 6–20)
CALCIUM: 9.1 mg/dL (ref 8.9–10.3)
CO2: 22 mmol/L (ref 22–32)
Chloride: 112 mmol/L — ABNORMAL HIGH (ref 98–111)
Creatinine, Ser: 0.97 mg/dL (ref 0.44–1.00)
GFR calc Af Amer: >60 mL/min (ref >60)
GFR calc non Af Amer: >60 mL/min (ref >60)
GLUCOSE: 110 mg/dL — AB (ref 70–99)
Potassium: 3.7 mmol/L (ref 3.5–5.1)
SODIUM: 143 mmol/L (ref 135–145)
Total Bilirubin: 0.5 mg/dL (ref 0.3–1.2)
Total Protein: 7.5 g/dL (ref 6.5–8.1)

## 2018-02-05 LAB — I-STAT CG4 LACTIC ACID, ED: Lactic Acid, Venous: 1.01 mmol/L (ref 0.5–1.9)

## 2018-02-05 LAB — URINALYSIS, ROUTINE W REFLEX MICROSCOPIC
Bilirubin Urine: NEGATIVE
Glucose, UA: NEGATIVE mg/dL
Hgb urine dipstick: NEGATIVE
KETONES UR: 5 mg/dL — AB
Nitrite: NEGATIVE
PROTEIN: NEGATIVE mg/dL
Specific Gravity, Urine: 1.02 (ref 1.005–1.030)
pH: 5 (ref 5.0–8.0)

## 2018-02-05 LAB — CBC WITH DIFFERENTIAL/PLATELET
BASOS PCT: 0 %
Basophils Absolute: 0 10*3/uL (ref 0.0–0.1)
Eosinophils Absolute: 0.1 10*3/uL (ref 0.0–0.7)
Eosinophils Relative: 2 %
HEMATOCRIT: 43.7 % (ref 36.0–46.0)
Hemoglobin: 14.6 g/dL (ref 12.0–15.0)
Lymphocytes Relative: 16 %
Lymphs Abs: 1 10*3/uL (ref 0.7–4.0)
MCH: 28.7 pg (ref 26.0–34.0)
MCHC: 33.4 g/dL (ref 30.0–36.0)
MCV: 85.9 fL (ref 78.0–100.0)
MONO ABS: 0.4 10*3/uL (ref 0.1–1.0)
MONOS PCT: 6 %
NEUTROS ABS: 5 10*3/uL (ref 1.7–7.7)
Neutrophils Relative %: 76 %
Platelets: 268 10*3/uL (ref 150–400)
RBC: 5.09 MIL/uL (ref 3.87–5.11)
RDW: 14.2 % (ref 11.5–15.5)
WBC: 6.6 10*3/uL (ref 4.0–10.5)

## 2018-02-05 LAB — I-STAT BETA HCG BLOOD, ED (MC, WL, AP ONLY): I-stat hCG, quantitative: 9.3 m[IU]/mL — ABNORMAL HIGH (ref <5)

## 2018-02-05 LAB — I-STAT TROPONIN, ED: TROPONIN I, POC: 0 ng/mL (ref 0.00–0.08)

## 2018-02-05 MED ORDER — SODIUM CHLORIDE 0.9 % IV BOLUS
1000.0000 mL | Freq: Once | INTRAVENOUS | Status: AC
  Administered 2018-02-05: 1000 mL via INTRAVENOUS

## 2018-02-05 MED ORDER — KETOROLAC TROMETHAMINE 15 MG/ML IJ SOLN: 15.0000 mg | Freq: Once | INTRAMUSCULAR | Status: DC

## 2018-02-05 MED ORDER — SODIUM CHLORIDE 0.9 % IV SOLN
1.0000 g | Freq: Once | INTRAVENOUS | Status: AC
  Administered 2018-02-05: 1 g via INTRAVENOUS
  Filled 2018-02-05: qty 10

## 2018-02-05 MED ORDER — AZITHROMYCIN 250 MG PO TABS: 250.0000 mg | ORAL_TABLET | Freq: Every day | ORAL | 0 refills | Status: DC

## 2018-02-05 MED ORDER — ACETAMINOPHEN 500 MG PO TABS
1000.0000 mg | ORAL_TABLET | Freq: Once | ORAL | Status: AC
  Administered 2018-02-05: 1000 mg via ORAL
  Filled 2018-02-05: qty 2

## 2018-02-05 MED ORDER — FENTANYL CITRATE (PF) 100 MCG/2ML IJ SOLN
25.0000 ug | Freq: Once | INTRAMUSCULAR | Status: AC
  Administered 2018-02-05: 25 ug via INTRAVENOUS
  Filled 2018-02-05: qty 2

## 2018-02-05 NOTE — ED Triage Notes (Signed)
Pt states she has been feeling exhausted since yesterday. Pt states she has been have lower back pain and malodorous urine. Pt states that she took her temp earlier today and it was 99.8.

## 2018-02-05 NOTE — Discharge Instructions (Addendum)
You were evaluated in the Emergency Department and after careful evaluation, we did not find any emergent condition requiring admission or further testing in the hospital.  Your symptoms today seem to be due to pneumonia.  Please take the medications provided as directed and follow-up with your regular doctor.  Drink plenty fluids at home.  Please return to the Emergency Department if you experience any worsening of your condition.  We encourage you to follow up with a primary care provider.  Thank you for allowing [place] to be a part of your care.

## 2018-02-05 NOTE — ED Provider Notes (Signed)
[facility] Emergency Department Provider Note MRN:  [id number]  Arrival date & time: 02/05/18     Chief Complaint   Back pain History of Present Illness   Brooke [name] is a 47 y.o. year-old female with a history of anxiety/depression presenting to the ED with chief complaint of back pain.  Patient explains that she has been working overtime for the past several weeks, has a number of life stressors related to family at home.  Began feeling general malaise, significant fatigue yesterday.  Also experiencing chest congestion, cough, mild sore throat.  Subjective fevers at home.  Lower back pain for the past day, gradual onset.  Foul-smelling urine at home today.  Denies chest pain, no shortness of breath, no abdominal pain.  No trauma to the back.  Review of Systems  A complete 10 system review of systems was obtained and all systems are negative except as noted in the HPI and PMH.   Patient's Health History    Past Medical History:  Diagnosis Date  . Anxiety   . Depression   . Hyperglycemia 03/05/2013  . Incontinence   . Lumbar stress fracture   . Osteoporosis     Past Surgical History:  Procedure Laterality Date  . RHINOPLASTY    . SINUS ENDO W/FUSION    . TONSILLECTOMY      Family History  Problem Relation Age of Onset  . Thyroid disease Mother   . Hyperlipidemia Mother   . Diabetes Father   . Hypertension Father   . Hyperlipidemia Father   . Diabetes Paternal Grandmother   . Hypertension Paternal Grandmother   . Hyperlipidemia Paternal Grandmother   . Mental illness Sister   . Hypertension Paternal Uncle   . Diabetes Paternal Aunt     Social History   Socioeconomic History  . Marital status: Married    Spouse name: Not on file  . Number of children: Not on file  . Years of education: 44  . Highest education level: Not on file  Occupational History  . Occupation: RN/OR    Employer: [employer]  . Financial resource  strain: Not on file  . Food insecurity:    Worry: Not on file    Inability: Not on file  . Transportation needs:    Medical: Not on file    Non-medical: Not on file  Tobacco Use  . Smoking status: Never Smoker  . Smokeless tobacco: Never Used  Substance and Sexual Activity  . Alcohol use: No    Alcohol/week: 0.0 standard drinks  . Drug use: No  . Sexual activity: Not on file  Lifestyle  . Physical activity:    Days per week: Not on file    Minutes per session: Not on file  . Stress: Not on file  Relationships  . Social connections:    Talks on phone: Not on file    Gets together: Not on file    Attends religious service: Not on file    Active member of club or organization: Not on file    Attends meetings of clubs or organizations: Not on file    Relationship status: Not on file  . Intimate partner violence:    Fear of current or ex partner: Not on file    Emotionally abused: Not on file    Physically abused: Not on file    Forced sexual activity: Not on file  Other Topics Concern  . Not on file  Social  History Narrative  . Not on file     Physical Exam  Vital Signs and Nursing Notes reviewed Vitals:   02/05/18 1600 02/05/18 1630  BP: 109/67 103/72  Pulse: (!) 101 98  Resp: (!) 24 (!) 22  Temp:  99 F (37.2 C)  SpO2: 98% 97%    CONSTITUTIONAL: Well-appearing, NAD NEURO:  Alert and oriented x 3, no focal deficits EYES:  eyes equal and reactive ENT/NECK:  no LAD, no JVD CARDIO: Tachycardic rate, well-perfused, normal S1 and S2 PULM:  CTAB no wheezing or rhonchi GI/GU:  normal bowel sounds, non-distended, non-tender MSK/SPINE:  No gross deformities, no edema SKIN:  no rash, atraumatic PSYCH:  Appropriate speech and behavior  Diagnostic and Interventional Summary    EKG Interpretation  Date/Time:    Ventricular Rate:    PR Interval:    QRS Duration:   QT Interval:    QTC Calculation:   R Axis:     Text Interpretation:        Labs Reviewed    COMPREHENSIVE METABOLIC PANEL - Abnormal; Notable for the following components:      Result Value   Chloride 112 (*)    Glucose, Bld 110 (*)    All other components within normal limits  URINALYSIS, ROUTINE W REFLEX MICROSCOPIC - Abnormal; Notable for the following components:   Ketones, ur 5 (*)    Leukocytes, UA SMALL (*)    Bacteria, UA RARE (*)    All other components within normal limits  I-STAT BETA HCG BLOOD, ED ([facility], [facility], AP ONLY) - Abnormal; Notable for the following components:   I-stat hCG, quantitative 9.3 (*)    All other components within normal limits  URINE CULTURE  CBC WITH DIFFERENTIAL/PLATELET  I-STAT CG4 LACTIC ACID, ED  I-STAT TROPONIN, ED  I-STAT CG4 LACTIC ACID, ED    DG Chest 2 View  Final Result      Medications  sodium chloride 0.9 % bolus 1,000 mL (1,000 mLs Intravenous New Bag/Given 02/05/18 1541)  fentaNYL (SUBLIMAZE) injection 25 mcg (25 mcg Intravenous Given 02/05/18 1537)  sodium chloride 0.9 % bolus 1,000 mL (1,000 mLs Intravenous New Bag/Given 02/05/18 1534)  cefTRIAXone (ROCEPHIN) 1 g in sodium chloride 0.9 % 100 mL IVPB (0 g Intravenous Stopped 02/05/18 1603)  acetaminophen (TYLENOL) tablet 1,000 mg (1,000 mg Oral Given 02/05/18 1528)     Procedures Critical Care  ED Course and Medical Decision Making  I have reviewed the triage vital signs and the nursing notes.  Pertinent labs & imaging results that were available during my care of the patient were reviewed by me and considered in my medical decision making (see below for details).  Febrile and tachycardic 47 year old female here with back pain, report of foul-smelling urine.  Suspecting pyelonephritis, inconsistent with kidney stone given the gradual onset of pain, mild pain.  Will provide fluids, antibiotics, pain control, reassess.  Tachycardia resolved, patient feeling much better.  Urinalysis largely unremarkable, will send for culture.  Chest x-ray revealing right lower lobe infiltrate.   Will treat for me acquired pneumonia.  After the discussed management above, the patient was determined to be safe for discharge.  The patient was in agreement with this plan and all questions regarding their care were answered.  ED return precautions were discussed and the patient will return to the ED with any significant worsening of condition.   Barth Kirks. Sedonia Small, [place] [email or web address]  Final Clinical Impressions(s) /  ED Diagnoses     ICD-10-CM   1. Community acquired pneumonia of right lower lobe of lung ([place]) J18.1     ED Discharge Orders         Ordered    azithromycin (ZITHROMAX) 250 MG tablet  Daily     02/05/18 1709             Maudie Flakes, MD 02/05/18 1711

## 2018-02-06 MED FILL — AZITHROMYCIN 250 MG TABLET: 250 | 5 days supply | Qty: 6 | Fill #0

## 2018-02-07 ENCOUNTER — Encounter: Payer: Self-pay | Admitting: Internal Medicine

## 2018-02-07 ENCOUNTER — Ambulatory Visit (INDEPENDENT_AMBULATORY_CARE_PROVIDER_SITE_OTHER): Payer: No Typology Code available for payment source | Admitting: Internal Medicine

## 2018-02-07 VITALS — BP 132/78 | HR 111 | Temp 98.1°F | Resp 16 | Ht 63.0 in | Wt 182.5 lb

## 2018-02-07 DIAGNOSIS — N912 Amenorrhea, unspecified: Secondary | ICD-10-CM | POA: Diagnosis not present

## 2018-02-07 DIAGNOSIS — J189 Pneumonia, unspecified organism: Secondary | ICD-10-CM | POA: Diagnosis not present

## 2018-02-07 LAB — URINE CULTURE: Culture: NO GROWTH

## 2018-02-07 MED ORDER — CEFUROXIME AXETIL 500 MG PO TABS: 500.0000 mg | ORAL_TABLET | Freq: Two times a day (BID) | ORAL | 0 refills | Status: DC

## 2018-02-07 MED ORDER — HYDROCODONE-HOMATROPINE 5-1.5 MG/5ML PO SYRP: 5.0000 mL | ORAL_SOLUTION | Freq: Every evening | ORAL | 0 refills | Status: DC | PRN

## 2018-02-07 MED FILL — CEFUROXIME AXETIL 500 MG TA: 500 | 7 days supply | Qty: 14 | Fill #0

## 2018-02-07 MED FILL — HYDROCODONE-HOMATROPINE SOL: 5-1.5 | 24 days supply | Qty: 120 | Fill #0

## 2018-02-07 NOTE — Progress Notes (Signed)
Pre visit review using our clinic review tool, if applicable. No additional management support is needed unless otherwise documented below in the visit note. 

## 2018-02-07 NOTE — Patient Instructions (Addendum)
Please go the lab and get your blood work  [place] Schedule your next appointment for a follow-up with your primary doctor next week  Rest, fluids , tylenol  For cough:  Take Mucinex DM twice a day as needed until better  For nasal congestion: Use OTC Nasocort or Flonase : 2 nasal sprays on each side of the nose in the morning until you feel better  Avoid decongestants such as  Pseudoephedrine or phenylephrine   Continue Zithromax Also  Ceftin for 1 week   Call if not gradually better over the next  10 days  Call anytime if the symptoms are severe (increased shortness of breath, palpitations, edema.)     Check the  blood pressure 2 or 3 times a month week monthly weekly daily Be sure your blood pressure is between 110/65 and  135/85. If it is consistently higher or lower, let me know

## 2018-02-07 NOTE — Progress Notes (Signed)
Subjective:    Patient ID: Brooke [name], female    DOB: 11-May-1970, 47 y.o.   MRN: [id number]  DOS:  02/07/2018 Type of visit - description :  Interval history: Went to the ER 02/05/2018. She complaining of 1 day history of malaise, fatigue, chest congestion, cough, mild sore throat, subjective fever.  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   She also had some chest pain, mostly on the right side. CMP, CBC negative.  Troponin x1 was neg Chest x-ray atelectasis versus right base infiltrate Interestingly, beta-hCG was slightly positive.  Patient was not aware.  Review of Systems She is here for follow-up. Still running a low-grade temp up to 98.9 and some chills. She continue coughing, yellow sputum. Had some palpitations.  Denies a  airplane trips or prolonged car trip.  No leg swelling or calf pain Continue with mild DOE. Cough is severe mostly at night   Past Medical History:  Diagnosis Date  . Anxiety   . Depression   . Hyperglycemia 03/05/2013  . Incontinence   . Lumbar stress fracture   . Osteoporosis     Past Surgical History:  Procedure Laterality Date  . RHINOPLASTY    . SINUS ENDO W/FUSION    . TONSILLECTOMY      Social History   Socioeconomic History  . Marital status: Married    Spouse name: Not on file  . Number of children: Not on file  . Years of education: 94  . Highest education level: Not on file  Occupational History  . Occupation: RN/OR    Employer: [employer]  . Financial resource strain: Not on file  . Food insecurity:    Worry: Not on file    Inability: Not on file  . Transportation needs:    Medical: Not on file    Non-medical: Not on file  Tobacco Use  . Smoking status: Never Smoker  . Smokeless tobacco: Never Used  Substance and Sexual Activity  . Alcohol use: No    Alcohol/week: 0.0 standard drinks  . Drug use: No  . Sexual activity: Yes    Partners: Male    Birth  control/protection: Surgical    Comment: Husband's vasectomy  Lifestyle  . Physical activity:    Days per week: Not on file    Minutes per session: Not on file  . Stress: Not on file  Relationships  . Social connections:    Talks on phone: Not on file    Gets together: Not on file    Attends religious service: Not on file    Active member of club or organization: Not on file    Attends meetings of clubs or organizations: Not on file    Relationship status: Not on file  . Intimate partner violence:    Fear of current or ex partner: Not on file    Emotionally abused: Not on file    Physically abused: Not on file    Forced sexual activity: Not on file  Other Topics Concern  . Not on file  Social History Narrative  . Not on file      Allergies as of 02/07/2018   No Known Allergies     Medication List        Accurate as of 02/07/18 11:59 PM. Always use your most recent med list.          ABILIFY 5 MG  tablet Generic drug:  ARIPiprazole Take 2.5 mg by mouth daily.   alendronate 70 MG tablet Commonly known as:  FOSAMAX Take 1 tablet (70 mg total) by mouth every 7 (seven) days. Take with a full glass of water on an empty stomach.   ALPRAZolam 0.5 MG tablet Commonly known as:  XANAX Take 1 mg by mouth at bedtime as needed for sleep.   AMBIEN 5 MG tablet Generic drug:  zolpidem Take 5 mg by mouth at bedtime as needed for sleep.   azithromycin 250 MG tablet Commonly known as:  ZITHROMAX Take 1 tablet (250 mg total) by mouth daily. Take first 2 tablets together, then 1 every day until finished.   brompheniramine-pseudoephedrine-DM 30-2-10 MG/5ML syrup Take 5 mLs by mouth 4 (four) times daily as needed.   calcium citrate-vitamin D 315-200 MG-UNIT tablet Commonly known as:  CITRACAL+D Take 2 tablets by mouth daily.   cefUROXime 500 MG tablet Commonly known as:  CEFTIN Take 1 tablet (500 mg total) by mouth 2 (two) times daily with a meal.   Cetirizine HCl 10 MG  Caps Take 1 capsule (10 mg total) by mouth daily for 10 days.   cholecalciferol 1000 units tablet Commonly known as:  VITAMIN D Take 1,000 Units by mouth daily.   Cranberry 1000 MG Caps Take 1 capsule by mouth every evening.   hydrochlorothiazide 12.5 MG tablet Commonly known as:  HYDRODIURIL Take 1 or 2 daily as needed for swelling in your legs   HYDROcodone-homatropine 5-1.5 MG/5ML syrup Commonly known as:  HYCODAN Take 5 mLs by mouth at bedtime as needed for cough.   ibuprofen 200 MG tablet Commonly known as:  ADVIL,MOTRIN Take 400 mg by mouth every 6 (six) hours as needed for moderate pain.   ketoconazole 2 % cream Commonly known as:  NIZORAL Apply to feet daily for 6 weeks.   PAXIL 20 MG tablet Generic drug:  PARoxetine Take 80 mg by mouth at bedtime.   TOPAMAX 100 MG tablet Generic drug:  topiramate Take 100 mg by mouth at bedtime.   traZODone 25 mg Tabs tablet Commonly known as:  DESYREL Take 25-50 mg by mouth at bedtime as needed for sleep.          Objective:   Physical Exam BP 132/78 (BP Location: Left Arm, Patient Position: Sitting, Cuff Size: Small)   Pulse (!) 111   Temp 98.1 F (36.7 C) (Oral)   Resp 16   Ht 5\' 3"  (1.6 m)   Wt 182 lb 8 oz (82.8 kg)   SpO2 97%   BMI 32.33 kg/m   General:   Well developed, NAD, see BMI.  HEENT:  Normocephalic . Face symmetric, atraumatic.  TMs normal, nose not congested, sinuses no TTP, throat symmetric. Lungs:  Few rhonchi, mostly on the right base, clear with cough.  Otherwise exam is normal. Normal respiratory effort, no intercostal retractions, no accessory muscle use. Heart: Mildly tachycardic, no murmur.  no pretibial edema bilaterally.  Calves symmetric and nontender Abdomen:  Not distended, soft, non-tender. No rebound or rigidity.   Skin: Not pale. Not jaundice Neurologic:  alert & oriented X3.  Speech normal, gait appropriate for age and unassisted Psych--  Cognition and judgment appear  intact.  Cooperative with normal attention span and concentration.  Behavior appropriate. No anxious or depressed appearing.     Assessment & Plan:    47 year old female, PMH includes of depression, recurrent UTIs, osteoporosis.  Presents with:  Community-acquired pneumonia: Diagnosed based on  symptoms and a chest x-ray.  She also has palpitations but no major risk factors for PE or DVT. She is a slightly tachycardic, still coughing significantly, worse at night, O2 sat wnl Also, beta-hCG was a slightly positive. Periods are far apart and very irregular. Plan: Quantitative hCG, CBC.  (pt's gynecologist: [facility] [place]) Continue Zithromax. Add Ceftin, cough control with Mucinex DM, low-dose hydrocodone. Come back , see PCP next week.

## 2018-02-08 LAB — CBC WITH DIFFERENTIAL/PLATELET
Basophils Absolute: 0 10*3/uL (ref 0.0–0.1)
Basophils Relative: 0.3 % (ref 0.0–3.0)
EOS PCT: 5.3 % — AB (ref 0.0–5.0)
Eosinophils Absolute: 0.2 10*3/uL (ref 0.0–0.7)
HCT: 41.7 % (ref 36.0–46.0)
Hemoglobin: 13.8 g/dL (ref 12.0–15.0)
LYMPHS ABS: 1.3 10*3/uL (ref 0.7–4.0)
Lymphocytes Relative: 27.5 % (ref 12.0–46.0)
MCHC: 33 g/dL (ref 30.0–36.0)
MCV: 86.5 fl (ref 78.0–100.0)
MONOS PCT: 10.6 % (ref 3.0–12.0)
Monocytes Absolute: 0.5 10*3/uL (ref 0.1–1.0)
NEUTROS PCT: 56.3 % (ref 43.0–77.0)
Neutro Abs: 2.6 10*3/uL (ref 1.4–7.7)
Platelets: 263 10*3/uL (ref 150.0–400.0)
RBC: 4.82 Mil/uL (ref 3.87–5.11)
RDW: 14.8 % (ref 11.5–15.5)
WBC: 4.6 10*3/uL (ref 4.0–10.5)

## 2018-02-08 LAB — HCG, QUANTITATIVE, PREGNANCY: Quantitative HCG: 5.56 m[IU]/mL

## 2018-02-10 ENCOUNTER — Telehealth: Payer: Self-pay

## 2018-02-10 NOTE — Telephone Encounter (Signed)
hCG decreasing, patient is perimenopausal thus negative. CBC normal. Patient aware, reports she is feeling better.

## 2018-02-10 NOTE — Telephone Encounter (Signed)
Pt called yesterday regarding lab results.

## 2018-02-14 ENCOUNTER — Ambulatory Visit (INDEPENDENT_AMBULATORY_CARE_PROVIDER_SITE_OTHER): Payer: No Typology Code available for payment source | Admitting: Family Medicine

## 2018-02-14 ENCOUNTER — Encounter: Payer: Self-pay | Admitting: Family Medicine

## 2018-02-14 VITALS — BP 98/62 | HR 90 | Temp 98.6°F | Resp 16 | Ht 63.0 in | Wt 184.2 lb

## 2018-02-14 DIAGNOSIS — J189 Pneumonia, unspecified organism: Secondary | ICD-10-CM

## 2018-02-14 DIAGNOSIS — J181 Lobar pneumonia, unspecified organism: Secondary | ICD-10-CM | POA: Diagnosis not present

## 2018-02-14 NOTE — Patient Instructions (Signed)

## 2018-02-14 NOTE — Progress Notes (Signed)
Patient ID: Brooke [name], female    DOB: 20-Mar-1971  Age: 47 y.o. MRN: [id number]    Subjective:  Subjective  HPI Brooke [name] presents for f/u pneumonia.  She was seen in the ER  Review of Systems  Constitutional: Positive for fatigue. Negative for chills and fever.  HENT: Negative for congestion and hearing loss.   Eyes: Negative for discharge.  Respiratory: Negative for cough and shortness of breath.   Cardiovascular: Negative for chest pain, palpitations and leg swelling.  Gastrointestinal: Negative for abdominal pain, blood in stool, constipation, diarrhea, nausea and vomiting.  Genitourinary: Negative for dysuria, frequency, hematuria and urgency.  Musculoskeletal: Negative for back pain and myalgias.  Skin: Negative for rash.  Allergic/Immunologic: Negative for environmental allergies.  Neurological: Negative for dizziness, weakness and headaches.  Hematological: Does not bruise/bleed easily.  Psychiatric/Behavioral: Negative for suicidal ideas. The patient is not nervous/anxious.     History Past Medical History:  Diagnosis Date  . Anxiety   . Depression   . Hyperglycemia 03/05/2013  . Incontinence   . Lumbar stress fracture   . Osteoporosis     She has a past surgical history that includes Rhinoplasty; Tonsillectomy; and Sinus endo w/fusion.   Her family history includes Diabetes in her father, paternal aunt, and paternal grandmother; Hyperlipidemia in her father, mother, and paternal grandmother; Hypertension in her father, paternal grandmother, and paternal uncle; Mental illness in her sister; Thyroid disease in her mother.She reports that she has never smoked. She has never used smokeless tobacco. She reports that she does not drink alcohol or use drugs.  Current Outpatient Medications on File Prior to Visit  Medication Sig Dispense Refill  . alendronate (FOSAMAX) 70 MG tablet Take 1 tablet (70 mg total) by mouth every 7 (seven) days. Take with a full  glass of water on an empty stomach. 12 tablet 3  . ALPRAZolam (XANAX) 0.5 MG tablet Take 1 mg by mouth at bedtime as needed for sleep.     . ARIPiprazole (ABILIFY) 5 MG tablet Take 2.5 mg by mouth daily.      . calcium citrate-vitamin D (CITRACAL+D) 315-200 MG-UNIT per tablet Take 2 tablets by mouth daily.    . cefUROXime (CEFTIN) 500 MG tablet Take 1 tablet (500 mg total) by mouth 2 (two) times daily with a meal. 14 tablet 0  . cholecalciferol (VITAMIN D) 1000 UNITS tablet Take 1,000 Units by mouth daily.    . Cranberry 1000 MG CAPS Take 1 capsule by mouth every evening.     Marland Kitchen HYDROcodone-homatropine (HYCODAN) 5-1.5 MG/5ML syrup Take 5 mLs by mouth at bedtime as needed for cough. 120 mL 0  . ibuprofen (ADVIL,MOTRIN) 200 MG tablet Take 400 mg by mouth every 6 (six) hours as needed for moderate pain.    Marland Kitchen ketoconazole (NIZORAL) 2 % cream Apply to feet daily for 6 weeks. 60 g 0  . PARoxetine (PAXIL) 20 MG tablet Take 80 mg by mouth at bedtime.     . topiramate (TOPAMAX) 100 MG tablet Take 100 mg by mouth at bedtime.     . traZODone (DESYREL) 25 mg TABS Take 25-50 mg by mouth at bedtime as needed for sleep.     Marland Kitchen zolpidem (AMBIEN) 5 MG tablet Take 5 mg by mouth at bedtime as needed for sleep.     No current facility-administered medications on file prior to visit.      Objective:  Objective  Physical Exam  Constitutional: She is oriented to person, place,  and time. She appears well-developed and well-nourished.  HENT:  Head: Normocephalic and atraumatic.  Eyes: Conjunctivae and EOM are normal.  Neck: Normal range of motion. Neck supple. No JVD present. Carotid bruit is not present. No thyromegaly present.  Cardiovascular: Normal rate, regular rhythm and normal heart sounds.  No murmur heard. Pulmonary/Chest: Effort normal and breath sounds normal. No respiratory distress. She has no wheezes. She has no rales. She exhibits no tenderness.  Musculoskeletal: She exhibits no edema.    Neurological: She is alert and oriented to person, place, and time.  Psychiatric: She has a normal mood and affect.  Nursing note and vitals reviewed.  BP 98/62 (BP Location: Right Arm, Cuff Size: Large)   Pulse 90   Temp 98.6 F (37 C) (Oral)   Resp 16   Ht 5\' 3"  (1.6 m)   Wt 184 lb 3.2 oz (83.6 kg)   SpO2 100%   BMI 32.63 kg/m  Wt Readings from Last 3 Encounters:  02/14/18 184 lb 3.2 oz (83.6 kg)  02/07/18 182 lb 8 oz (82.8 kg)  02/05/18 180 lb (81.6 kg)     Lab Results  Component Value Date   WBC 4.6 02/07/2018   HGB 13.8 02/07/2018   HCT 41.7 02/07/2018   PLT 263.0 02/07/2018   GLUCOSE 110 (H) 02/05/2018   CHOL 163 01/07/2016   TRIG 130.0 01/07/2016   HDL 44.00 01/07/2016   LDLDIRECT 130.6 09/05/2009   LDLCALC 93 01/07/2016   ALT 29 02/05/2018   AST 26 02/05/2018   NA 143 02/05/2018   K 3.7 02/05/2018   CL 112 (H) 02/05/2018   CREATININE 0.97 02/05/2018   BUN 17 02/05/2018   CO2 22 02/05/2018   TSH 1.50 06/27/2017   HGBA1C 5.5 03/05/2013   MICROALBUR 0.6 03/05/2013    Dg Chest 2 View  Result Date: 02/05/2018 CLINICAL DATA:  Exhaustion since yesterday, lower back pain, malodorous urine, congestion EXAM: CHEST - 2 VIEW COMPARISON:  08/03/2015 FINDINGS: Normal heart size, mediastinal contours, and pulmonary vascularity. Increased markings at RIGHT base question atelectasis versus infiltrate. Remaining lungs clear. No pleural effusion or pneumothorax. Bones unremarkable. IMPRESSION: Atelectasis versus infiltrate at RIGHT base. Electronically Signed   By: Lavonia Dana M.D.   On: 02/05/2018 16:04     Assessment & Plan:  Plan  I have discontinued Brooke [name]'s hydrochlorothiazide, Cetirizine HCl, brompheniramine-pseudoephedrine-DM, and azithromycin. I am also having her maintain her topiramate, ALPRAZolam, PARoxetine, ARIPiprazole, traZODone, zolpidem, calcium citrate-vitamin D, cholecalciferol, Cranberry, ibuprofen, alendronate, ketoconazole, cefUROXime,  and HYDROcodone-homatropine.  No orders of the defined types were placed in this encounter.   Problem List Items Addressed This Visit    None    Visit Diagnoses    Pneumonia of right lower lobe due to infectious organism [facility])    -  Primary   Relevant Orders   DG Chest 2 View (Completed)     abx almost finished rto prn  Follow-up: Return if symptoms worsen or fail to improve.  Ann Held, DO

## 2018-02-16 ENCOUNTER — Ambulatory Visit (HOSPITAL_BASED_OUTPATIENT_CLINIC_OR_DEPARTMENT_OTHER)
Admission: RE | Admit: 2018-02-16 | Discharge: 2018-02-16 | Disposition: A | Discharge status: Home / Self Care | Payer: No Typology Code available for payment source | Source: Ambulatory Visit | Attending: Family Medicine | Admitting: Family Medicine

## 2018-02-16 DIAGNOSIS — J181 Lobar pneumonia, unspecified organism: Secondary | ICD-10-CM | POA: Insufficient documentation

## 2018-02-16 DIAGNOSIS — J189 Pneumonia, unspecified organism: Secondary | ICD-10-CM

## 2018-02-16 NOTE — Progress Notes (Signed)
Results given to patient as normal 

## 2018-02-20 MED FILL — PARoxetine HCL 40 MG TABS: 40 | 30 days supply | Qty: 60 | Fill #1

## 2018-02-21 ENCOUNTER — Other Ambulatory Visit: Payer: Self-pay | Admitting: Family Medicine

## 2018-02-21 ENCOUNTER — Encounter: Payer: Self-pay | Admitting: Family Medicine

## 2018-02-21 DIAGNOSIS — E669 Obesity, unspecified: Secondary | ICD-10-CM

## 2018-02-21 MED ORDER — LIRAGLUTIDE -WEIGHT MANAGEMENT 18 MG/3ML ~~LOC~~ SOPN: 3.0000 mg | PEN_INJECTOR | Freq: Every day | SUBCUTANEOUS | 5 refills | Status: DC

## 2018-02-22 ENCOUNTER — Telehealth: Payer: Self-pay | Admitting: Family Medicine

## 2018-02-22 DIAGNOSIS — E669 Obesity, unspecified: Secondary | ICD-10-CM

## 2018-02-22 MED ORDER — LIRAGLUTIDE -WEIGHT MANAGEMENT 18 MG/3ML ~~LOC~~ SOPN: 3.0000 mg | PEN_INJECTOR | Freq: Every day | SUBCUTANEOUS | 1 refills | Status: DC

## 2018-02-22 NOTE — Telephone Encounter (Signed)
rx sent in 

## 2018-02-22 NOTE — Telephone Encounter (Signed)
Copied from [place] [id number]. Topic: Quick Communication - See Telephone Encounter >> Feb 22, 2018  8:26 AM Alfredia Ferguson R wrote: [place] called and stated  Liraglutide -Weight Management (SAXENDA) 18 MG/3ML SOPN needs to be 10pens sent instead of 1 pen

## 2018-02-23 ENCOUNTER — Encounter: Payer: Self-pay | Admitting: Family Medicine

## 2018-02-23 ENCOUNTER — Telehealth: Payer: Self-pay

## 2018-02-23 NOTE — Telephone Encounter (Signed)
PA initiated via Covermymeds; KEY: [id number]. Awaiting determination.

## 2018-02-27 NOTE — Telephone Encounter (Signed)
PA approved.

## 2018-02-28 MED FILL — SAXENDA 18 MG/3 ML PEN: 18 | 30 days supply | Qty: 15 | Fill #0

## 2018-03-31 MED FILL — PARoxetine HCL 40 MG TABS: 40 | 30 days supply | Qty: 60 | Fill #2

## 2018-03-31 MED FILL — ALPRAZolam ER 0.5 MG TB24: 0.5 | 30 days supply | Qty: 90 | Fill #1

## 2018-04-03 ENCOUNTER — Other Ambulatory Visit: Payer: Self-pay

## 2018-04-03 MED ORDER — ARIPIPRAZOLE 2 MG PO TABS: 2.0000 mg | ORAL_TABLET | Freq: Every day | ORAL | 0 refills | Status: DC

## 2018-04-03 MED FILL — ARIPiprazole 2 MG TABS: 2 | 90 days supply | Qty: 90 | Fill #0

## 2018-04-13 MED FILL — ZOLPIDEM TARTRATE 10 MG TAB: 10 | 30 days supply | Qty: 30 | Fill #0

## 2018-04-17 ENCOUNTER — Encounter: Payer: Self-pay | Admitting: Emergency Medicine

## 2018-04-17 DIAGNOSIS — F411 Generalized anxiety disorder: Secondary | ICD-10-CM

## 2018-04-17 DIAGNOSIS — F4001 Agoraphobia with panic disorder: Secondary | ICD-10-CM | POA: Insufficient documentation

## 2018-04-21 MED FILL — SAXENDA 18 MG/3 ML PEN: 18 | 30 days supply | Qty: 15 | Fill #0

## 2018-04-28 ENCOUNTER — Encounter: Payer: Self-pay | Admitting: Psychiatry

## 2018-04-28 ENCOUNTER — Encounter (HOSPITAL_COMMUNITY): Payer: Self-pay | Admitting: Emergency Medicine

## 2018-04-28 ENCOUNTER — Ambulatory Visit (INDEPENDENT_AMBULATORY_CARE_PROVIDER_SITE_OTHER): Payer: No Typology Code available for payment source | Admitting: Psychiatry

## 2018-04-28 ENCOUNTER — Emergency Department (HOSPITAL_COMMUNITY): Payer: No Typology Code available for payment source

## 2018-04-28 ENCOUNTER — Emergency Department (HOSPITAL_COMMUNITY)
Admission: EM | Admit: 2018-04-28 | Discharge: 2018-04-28 | Disposition: A | Discharge status: Home / Self Care | Payer: No Typology Code available for payment source | Attending: Emergency Medicine | Admitting: Emergency Medicine

## 2018-04-28 DIAGNOSIS — R110 Nausea: Secondary | ICD-10-CM | POA: Insufficient documentation

## 2018-04-28 DIAGNOSIS — F4001 Agoraphobia with panic disorder: Secondary | ICD-10-CM | POA: Diagnosis not present

## 2018-04-28 DIAGNOSIS — F411 Generalized anxiety disorder: Secondary | ICD-10-CM | POA: Diagnosis not present

## 2018-04-28 DIAGNOSIS — F3342 Major depressive disorder, recurrent, in full remission: Secondary | ICD-10-CM

## 2018-04-28 DIAGNOSIS — R1011 Right upper quadrant pain: Secondary | ICD-10-CM | POA: Diagnosis present

## 2018-04-28 LAB — COMPREHENSIVE METABOLIC PANEL
ALT: 21 U/L (ref 0–44)
AST: 21 U/L (ref 15–41)
Albumin: 4.2 g/dL (ref 3.5–5.0)
Alkaline Phosphatase: 70 U/L (ref 38–126)
Anion gap: 6 (ref 5–15)
BUN: 14 mg/dL (ref 6–20)
CO2: 23 mmol/L (ref 22–32)
Calcium: 9 mg/dL (ref 8.9–10.3)
Chloride: 112 mmol/L — ABNORMAL HIGH (ref 98–111)
Creatinine, Ser: 1.01 mg/dL — ABNORMAL HIGH (ref 0.44–1.00)
GFR calc Af Amer: >60 mL/min (ref >60)
GFR calc non Af Amer: >60 mL/min (ref >60)
Glucose, Bld: 116 mg/dL — ABNORMAL HIGH (ref 70–99)
Potassium: 4 mmol/L (ref 3.5–5.1)
Sodium: 141 mmol/L (ref 135–145)
Total Bilirubin: 0.3 mg/dL (ref 0.3–1.2)
Total Protein: 6.7 g/dL (ref 6.5–8.1)

## 2018-04-28 LAB — CBC
HCT: 43.8 % (ref 36.0–46.0)
Hemoglobin: 14.1 g/dL (ref 12.0–15.0)
MCH: 29.4 pg (ref 26.0–34.0)
MCHC: 32.2 g/dL (ref 30.0–36.0)
MCV: 91.3 fL (ref 80.0–100.0)
Platelets: 198 10*3/uL (ref 150–400)
RBC: 4.8 MIL/uL (ref 3.87–5.11)
RDW: 14.2 % (ref 11.5–15.5)
WBC: 3.5 10*3/uL — ABNORMAL LOW (ref 4.0–10.5)
nRBC: 0 % (ref 0.0–0.2)

## 2018-04-28 LAB — URINALYSIS, ROUTINE W REFLEX MICROSCOPIC
Bacteria, UA: NONE SEEN
Bilirubin Urine: NEGATIVE
Glucose, UA: NEGATIVE mg/dL
Hgb urine dipstick: NEGATIVE
Ketones, ur: NEGATIVE mg/dL
Nitrite: NEGATIVE
Protein, ur: NEGATIVE mg/dL
Specific Gravity, Urine: 1.011 (ref 1.005–1.030)
pH: 6 (ref 5.0–8.0)

## 2018-04-28 LAB — LIPASE, BLOOD: Lipase: 41 U/L (ref 11–51)

## 2018-04-28 LAB — I-STAT BETA HCG BLOOD, ED (MC, WL, AP ONLY): I-stat hCG, quantitative: 5.1 m[IU]/mL — ABNORMAL HIGH (ref <5)

## 2018-04-28 MED ORDER — PROPRANOLOL HCL 20 MG PO TABS: 20.0000 mg | ORAL_TABLET | Freq: Two times a day (BID) | ORAL | 1 refills | Status: DC | PRN

## 2018-04-28 MED ORDER — TOPIRAMATE 50 MG PO TABS: 100.0000 mg | ORAL_TABLET | Freq: Two times a day (BID) | ORAL | 1 refills | Status: DC

## 2018-04-28 MED ORDER — ONDANSETRON HCL 4 MG/2ML IJ SOLN
4.0000 mg | Freq: Once | INTRAMUSCULAR | Status: AC
  Administered 2018-04-28: 4 mg via INTRAVENOUS
  Filled 2018-04-28: qty 2

## 2018-04-28 MED ORDER — PAROXETINE HCL 40 MG PO TABS: 80.0000 mg | ORAL_TABLET | Freq: Every day | ORAL | 1 refills | Status: DC

## 2018-04-28 MED ORDER — ONDANSETRON 4 MG PO TBDP: 4.0000 mg | ORAL_TABLET | Freq: Three times a day (TID) | ORAL | 0 refills | Status: DC | PRN

## 2018-04-28 MED ORDER — DICYCLOMINE HCL 20 MG PO TABS: 20.0000 mg | ORAL_TABLET | Freq: Three times a day (TID) | ORAL | 0 refills | Status: DC | PRN

## 2018-04-28 MED FILL — PROPRANOLOL 20 MG TABLET: 20 | 30 days supply | Qty: 120 | Fill #0

## 2018-04-28 MED FILL — DICYCLOMINE 20 MG TABLET: 20 | 6 days supply | Qty: 20 | Fill #0

## 2018-04-28 MED FILL — ONDANSETRON ODT 4 MG TABLET: 4 | 6 days supply | Qty: 20 | Fill #0

## 2018-04-28 MED FILL — PARoxetine HCL 40 MG TABS: 40 | 90 days supply | Qty: 180 | Fill #0

## 2018-04-28 MED FILL — TOPIRAMATE 50 MG TABLET: 50 | 90 days supply | Qty: 360 | Fill #0

## 2018-04-28 NOTE — ED Provider Notes (Signed)
Emergency Department Provider Note   I have reviewed the triage vital signs and the nursing notes.   HISTORY  Chief Complaint Abdominal Pain   HPI Brooke [name] is a 47 y.o. female with PMH of osteoporosis, anxiety, and depression presents to the emergency department for evaluation of epigastric abdominal pain and nausea/vomiting.  No diarrhea.  Symptoms began 2 days prior.  No UTI symptoms.  No flank pain.  No fevers or chills. No diarrhea. No travel or sick contacts. Pain not worse with eating or drinking.   Past Medical History:  Diagnosis Date  . Anxiety   . Depression   . Hyperglycemia 03/05/2013  . Incontinence   . Lumbar stress fracture   . Osteoporosis     Patient Active Problem List   Diagnosis Date Noted  . GAD (generalized anxiety disorder) 04/17/2018  . Panic disorder with agoraphobia 04/17/2018  . Poison ivy dermatitis 08/22/2015  . Acute bacterial sinusitis 07/30/2015  . Morbid obesity ([place]) 03/17/2015  . Preventative health care 02/19/2014  . Recurrent UTI 02/06/2014  . Osteoporosis, unspecified 01/10/2014  . Hyperglycemia 03/05/2013  . Urinary frequency 09/04/2012  . Sore throat 03/01/2012  . Sleep disturbance 12/13/2011  . Back pain 12/13/2011  . FEMALE STRESS INCONTINENCE 09/05/2009  . DEPRESSION 05/31/2008  . EATING DISORDER 08/23/2006    Past Surgical History:  Procedure Laterality Date  . RHINOPLASTY    . SINUS ENDO W/FUSION    . TONSILLECTOMY     Allergies Patient has no known allergies.  Family History  Problem Relation Age of Onset  . Thyroid disease Mother   . Hyperlipidemia Mother   . Diabetes Father   . Hypertension Father   . Hyperlipidemia Father   . Diabetes Paternal Grandmother   . Hypertension Paternal Grandmother   . Hyperlipidemia Paternal Grandmother   . Mental illness Sister   . Hypertension Paternal Uncle   . Diabetes Paternal Aunt     Social History Social History   Tobacco Use  . Smoking status:  Never Smoker  . Smokeless tobacco: Never Used  Substance Use Topics  . Alcohol use: No    Alcohol/week: 0.0 standard drinks  . Drug use: No    Review of Systems  Constitutional: No fever/chills Eyes: No visual changes. ENT: No sore throat. Cardiovascular: Denies chest pain. Respiratory: Denies shortness of breath. Gastrointestinal: Positive RUQ abdominal pain. Positive nausea and vomiting.  No diarrhea.  No constipation. Genitourinary: Negative for dysuria. Musculoskeletal: Negative for back pain. Skin: Negative for rash. Neurological: Negative for headaches, focal weakness or numbness.  10-point ROS otherwise negative.  ____________________________________________   PHYSICAL EXAM:  VITAL SIGNS: ED Triage Vitals [04/28/18 1158]  Enc Vitals Group     BP 121/83     Pulse Rate 91     Resp 17     Temp 98.6 F (37 C)     Temp Source Oral     SpO2 99 %   Constitutional: Alert and oriented. Well appearing and in no acute distress. Eyes: Conjunctivae are normal. Head: Atraumatic. Nose: No congestion/rhinnorhea. Mouth/Throat: Mucous membranes are moist.  Oropharynx non-erythematous. Neck: No stridor.   Cardiovascular: Normal rate, regular rhythm. Good peripheral circulation. Grossly normal heart sounds.   Respiratory: Normal respiratory effort.  No retractions. Lungs CTAB. Gastrointestinal: Soft with mild epigastric tenderness. No rebound or guarding. No distention.  Musculoskeletal: No lower extremity tenderness nor edema. No gross deformities of extremities. Neurologic:  Normal speech and language. No gross focal neurologic deficits are  appreciated.  Skin:  Skin is warm, dry and intact. No rash noted.  ____________________________________________   LABS (all labs ordered are listed, but only abnormal results are displayed)  Labs Reviewed  COMPREHENSIVE METABOLIC PANEL - Abnormal; Notable for the following components:      Result Value   Chloride 112 (*)     Glucose, Bld 116 (*)    Creatinine, Ser 1.01 (*)    All other components within normal limits  CBC - Abnormal; Notable for the following components:   WBC 3.5 (*)    All other components within normal limits  URINALYSIS, ROUTINE W REFLEX MICROSCOPIC - Abnormal; Notable for the following components:   Leukocytes, UA SMALL (*)    All other components within normal limits  I-STAT BETA HCG BLOOD, ED ([facility], [facility], [facility] ONLY) - Abnormal; Notable for the following components:   I-stat hCG, quantitative 5.1 (*)    All other components within normal limits  LIPASE, BLOOD   ____________________________________________  RADIOLOGY  US Abdomen Limited Ruq  Result Date: 04/28/2018 CLINICAL DATA:  Right upper quadrant abdominal pain EXAM: ULTRASOUND ABDOMEN LIMITED RIGHT UPPER QUADRANT COMPARISON:  None. FINDINGS: Gallbladder: Gallbladder is contracted. No visible stones or wall thickening. Negative sonographic Murphy's. Common bile duct: Diameter: Not visualized Liver: Mildly increased echotexture suggesting fatty infiltration. No focal hepatic abnormality or biliary ductal dilatation. Portal vein is patent on color Doppler imaging with normal direction of blood flow towards the liver. IMPRESSION: Contracted gallbladder. No cholelithiasis or evidence of acute cholecystitis. Mild fatty infiltration of the liver. Electronically Signed   By: Rolm Baptise M.D.   On: 04/28/2018 13:10    ____________________________________________   PROCEDURES  Procedure(s) performed:   Procedures  None ____________________________________________   INITIAL IMPRESSION / ASSESSMENT AND PLAN / ED COURSE  Pertinent labs & imaging results that were available during my care of the patient were reviewed by me and considered in my medical decision making (see chart for details).  Patient presents to the emergency department with epigastric abdominal pain, nausea, vomiting.  Mild tenderness on exam.  No diarrhea.  Right  upper quadrant ultrasound obtained which shows contracted gallbladder but no acute cholecystitis or cholelithiasis. Afebrile. HCG at 5.1 which I suspect is lab error. Plan for symptom mgmt at home and PCP follow up.   At this time, I do not feel there is any life-threatening condition present. I have reviewed and discussed all results (EKG, imaging, lab, urine as appropriate), exam findings with patient. I have reviewed nursing notes and appropriate previous records.  I feel the patient is safe to be discharged home without further emergent workup. Discussed usual and customary return precautions. Patient and family (if present) verbalize understanding and are comfortable with this plan.  Patient will follow-up with their primary care provider. If they do not have a primary care provider, information for follow-up has been provided to them. All questions have been answered.  ____________________________________________  FINAL CLINICAL IMPRESSION(S) / ED DIAGNOSES  Final diagnoses:  RUQ abdominal pain  Nausea     MEDICATIONS GIVEN DURING THIS VISIT:  Medications  ondansetron (ZOFRAN) injection 4 mg (4 mg Intravenous Given 04/28/18 1238)     NEW OUTPATIENT MEDICATIONS STARTED DURING THIS VISIT:  Discharge Medication List as of 04/28/2018  1:27 PM    START taking these medications   Details  dicyclomine (BENTYL) 20 MG tablet Take 1 tablet (20 mg total) by mouth 3 (three) times daily as needed for spasms (abdominal cramping)., Starting Fri 04/28/2018,  Print    ondansetron (ZOFRAN ODT) 4 MG disintegrating tablet Take 1 tablet (4 mg total) by mouth every 8 (eight) hours as needed for nausea or vomiting., Starting Fri 04/28/2018, Print        Note:  This document was prepared using Dragon voice recognition software and may include unintentional dictation errors.  Nanda Quinton, MD Emergency Medicine    Yohana Bartha, [facility], MD 04/28/18 1539

## 2018-04-28 NOTE — ED Triage Notes (Signed)
Pt c/o RUQ pain for couple days. Denies vomiting but has nausea. Denies bowel or urinary problems.

## 2018-04-28 NOTE — ED Notes (Signed)
US at bedside

## 2018-04-28 NOTE — Progress Notes (Signed)
Brooke [name] [id number] 10-Mar-1971 47 y.o.  Subjective:   Patient ID:  Brooke [name] is a 47 y.o. (DOB Jul 12, 1970) female.  Chief Complaint:  Chief Complaint  Patient presents with  . Follow-up    Medication Management  . Anxiety    Increased slightly    HPI Brooke [name] presents to the office today for follow-up of anxiety and insomnia. A little worse for the past month but working extra shifts and on call at night and 3 kids all under 79mons old at home.  Pt reports that mood is Anxious and describes anxiety as Moderate. Anxiety symptoms include: Excessive Worry, Panic Symptoms, heart racing. Worry can occur anytime.  Pt reports has difficulty falling asleep, has interrupted sleep and part is related to night shift call.  Rethinking that.  Pt reports that appetite is good. Pt reports that energy is no change and good. Concentration is down slightly. Suicidal thoughts:  denied by patient.  Pleased with meds.  Lost 30# on Saxenda and feels good about that and better mood.  Review of Systems:  Review of Systems  Neurological: Negative for tremors and weakness.  Psychiatric/Behavioral: Positive for sleep disturbance. Negative for agitation, behavioral problems, confusion, decreased concentration, dysphoric mood, hallucinations, self-injury and suicidal ideas. The patient is nervous/anxious. The patient is not hyperactive.     Medications: I have reviewed the patient's current medications.  Current Outpatient Medications  Medication Sig Dispense Refill  . ALPRAZolam (XANAX XR) 0.5 MG 24 hr tablet Take 0.5 mg by mouth daily. 1 in am if needed for anxiety and 2 at night for anxiety and sleep    . ARIPiprazole (ABILIFY) 2 MG tablet Take 1 tablet (2 mg total) by mouth daily. 90 tablet 0  . calcium citrate-vitamin D (CITRACAL+D) 315-200 MG-UNIT per tablet Take 2 tablets by mouth daily.    . cholecalciferol (VITAMIN D) 1000 UNITS tablet Take 1,000 Units by mouth daily.     Marland Kitchen ibuprofen (ADVIL,MOTRIN) 200 MG tablet Take 400 mg by mouth every 6 (six) hours as needed for moderate pain.    Marland Kitchen ketoconazole (NIZORAL) 2 % cream Apply to feet daily for 6 weeks. 60 g 0  . Liraglutide -Weight Management (SAXENDA) 18 MG/3ML SOPN Inject 3 mg into the skin daily. 10 pen 1  . PARoxetine (PAXIL) 40 MG tablet Take 80 mg by mouth at bedtime.     . topiramate (TOPAMAX) 50 MG tablet Take 150 mg by mouth at bedtime.    . traZODone (DESYREL) 25 mg TABS Take 25-50 mg by mouth at bedtime as needed for sleep.     Marland Kitchen zolpidem (AMBIEN) 10 MG tablet Take 10 mg by mouth at bedtime as needed for sleep.    Marland Kitchen alendronate (FOSAMAX) 70 MG tablet Take 1 tablet (70 mg total) by mouth every 7 (seven) days. Take with a full glass of water on an empty stomach. (Patient not taking: Reported on 04/28/2018) 12 tablet 3  . cefUROXime (CEFTIN) 500 MG tablet Take 1 tablet (500 mg total) by mouth 2 (two) times daily with a meal. (Patient not taking: Reported on 04/28/2018) 14 tablet 0  . Cranberry 1000 MG CAPS Take 1 capsule by mouth every evening.     Marland Kitchen HYDROcodone-homatropine (HYCODAN) 5-1.5 MG/5ML syrup Take 5 mLs by mouth at bedtime as needed for cough. (Patient not taking: Reported on 04/28/2018) 120 mL 0   No current facility-administered medications for this visit.     Medication Side Effects: None  Allergies:  No Known Allergies  Past Medical History:  Diagnosis Date  . Anxiety   . Depression   . Hyperglycemia 03/05/2013  . Incontinence   . Lumbar stress fracture   . Osteoporosis     Family History  Problem Relation Age of Onset  . Thyroid disease Mother   . Hyperlipidemia Mother   . Diabetes Father   . Hypertension Father   . Hyperlipidemia Father   . Diabetes Paternal Grandmother   . Hypertension Paternal Grandmother   . Hyperlipidemia Paternal Grandmother   . Mental illness Sister   . Hypertension Paternal Uncle   . Diabetes Paternal Aunt     Social History   Socioeconomic  History  . Marital status: Married    Spouse name: Not on file  . Number of children: Not on file  . Years of education: 66  . Highest education level: Not on file  Occupational History  . Occupation: RN/OR    Employer: [employer]  . Financial resource strain: Not on file  . Food insecurity:    Worry: Not on file    Inability: Not on file  . Transportation needs:    Medical: Not on file    Non-medical: Not on file  Tobacco Use  . Smoking status: Never Smoker  . Smokeless tobacco: Never Used  Substance and Sexual Activity  . Alcohol use: No    Alcohol/week: 0.0 standard drinks  . Drug use: No  . Sexual activity: Yes    Partners: Male    Birth control/protection: Surgical    Comment: Husband's vasectomy  Lifestyle  . Physical activity:    Days per week: Not on file    Minutes per session: Not on file  . Stress: Not on file  Relationships  . Social connections:    Talks on phone: Not on file    Gets together: Not on file    Attends religious service: Not on file    Active member of club or organization: Not on file    Attends meetings of clubs or organizations: Not on file    Relationship status: Not on file  . Intimate partner violence:    Fear of current or ex partner: Not on file    Emotionally abused: Not on file    Physically abused: Not on file    Forced sexual activity: Not on file  Other Topics Concern  . Not on file  Social History Narrative  . Not on file    Past Medical History, Surgical history, Social history, and Family history were reviewed and updated as appropriate.   Please see review of systems for further details on the patient's review from today.   Objective:   Physical Exam:  There were no vitals taken for this visit.  Physical Exam Constitutional:      General: She is not in acute distress.    Appearance: Normal appearance. She is well-developed.  Musculoskeletal:        General: No deformity.  Neurological:      Mental Status: She is alert and oriented to person, place, and time.     Motor: No tremor.     Coordination: Coordination normal.     Gait: Gait normal.  Psychiatric:        Attention and Perception: She is attentive.        Mood and Affect: Mood is anxious. Mood is not depressed. Affect is not labile, blunt, angry or inappropriate.  Speech: Speech normal.        Behavior: Behavior normal.        Thought Content: Thought content normal. Thought content does not include homicidal or suicidal ideation. Thought content does not include homicidal or suicidal plan.        Cognition and Memory: Cognition normal.        Judgment: Judgment normal.     Comments: Insight intact. No auditory or visual hallucinations. No delusions.      Lab Review:     Component Value Date/Time   NA 143 02/05/2018 1514   K 3.7 02/05/2018 1514   CL 112 (H) 02/05/2018 1514   CO2 22 02/05/2018 1514   GLUCOSE 110 (H) 02/05/2018 1514   BUN 17 02/05/2018 1514   CREATININE 0.97 02/05/2018 1514   CALCIUM 9.1 02/05/2018 1514   PROT 7.5 02/05/2018 1514   ALBUMIN 4.6 02/05/2018 1514   AST 26 02/05/2018 1514   ALT 29 02/05/2018 1514   ALKPHOS 89 02/05/2018 1514   BILITOT 0.5 02/05/2018 1514   GFRNONAA >60 02/05/2018 1514   GFRAA >60 02/05/2018 1514       Component Value Date/Time   WBC 4.6 02/07/2018 1509   RBC 4.82 02/07/2018 1509   HGB 13.8 02/07/2018 1509   HCT 41.7 02/07/2018 1509   PLT 263.0 02/07/2018 1509   MCV 86.5 02/07/2018 1509   MCV 91.8 08/03/2015 1159   MCH 28.7 02/05/2018 1514   MCHC 33.0 02/07/2018 1509   RDW 14.8 02/07/2018 1509   LYMPHSABS 1.3 02/07/2018 1509   MONOABS 0.5 02/07/2018 1509   EOSABS 0.2 02/07/2018 1509   BASOSABS 0.0 02/07/2018 1509    No results found for: POCLITH, LITHIUM   No results found for: PHENYTOIN, PHENOBARB, VALPROATE, CBMZ   .res Assessment: Plan:    Generalized anxiety disorder  Panic disorder with agoraphobia  Major depression, recurrent,  full remission ([place])   Overall med combo has worked well for depression and anxiety but is more anxiious lately..  Disc options for benefit more: increase Abilify, topiramate off label, prn propranolol. Disc SE in detail of each option.   Prefer to avoid increasing Xanax.  Discussed potential metabolic side effects associated with atypical antipsychotics, as well as potential risk for movement side effects. Advised pt to contact office if movement side effects occur.   We discussed the short-term risks associated with benzodiazepines including sedation and increased fall risk among others.  Discussed long-term side effect risk including dependence, potential withdrawal symptoms, and the potential eventual dose-related risk of dementia. And disc risk of Ambien amnesia.  Better self care and more sleep would help.  Increase topiramate bc no weight gain risk to 200mg  daily.  Disc SE.  Rx prn propranolol 20-40 mg BID for anxiety.   This appt was 30 mins.  FU 8 weeks  Lynder Parents, MD, DFAPA     Please see After Visit Summary for patient specific instructions.  No future appointments.  No orders of the defined types were placed in this encounter.     -------------------------------

## 2018-04-28 NOTE — Discharge Instructions (Signed)

## 2018-05-08 ENCOUNTER — Encounter: Payer: Self-pay | Admitting: Family Medicine

## 2018-05-08 ENCOUNTER — Other Ambulatory Visit: Payer: Self-pay | Admitting: Family Medicine

## 2018-05-08 DIAGNOSIS — R1011 Right upper quadrant pain: Secondary | ICD-10-CM

## 2018-05-12 ENCOUNTER — Ambulatory Visit (HOSPITAL_COMMUNITY): Payer: No Typology Code available for payment source

## 2018-05-15 MED FILL — ALPRAZolam ER 0.5 MG TB24: 0.5 | 30 days supply | Qty: 90 | Fill #2

## 2018-05-19 ENCOUNTER — Encounter (HOSPITAL_COMMUNITY)
Admission: RE | Admit: 2018-05-19 | Discharge: 2018-05-19 | Disposition: A | Discharge status: Home / Self Care | Payer: No Typology Code available for payment source | Source: Ambulatory Visit | Attending: Family Medicine | Admitting: Family Medicine

## 2018-05-19 ENCOUNTER — Other Ambulatory Visit: Payer: Self-pay | Admitting: Family Medicine

## 2018-05-19 DIAGNOSIS — R1011 Right upper quadrant pain: Secondary | ICD-10-CM | POA: Diagnosis present

## 2018-05-19 MED ORDER — TECHNETIUM TC 99M MEBROFENIN IV KIT
5.4000 | PACK | Freq: Once | INTRAVENOUS | Status: AC | PRN
  Administered 2018-05-19: 5.4 via INTRAVENOUS

## 2018-06-01 ENCOUNTER — Ambulatory Visit (INDEPENDENT_AMBULATORY_CARE_PROVIDER_SITE_OTHER): Payer: No Typology Code available for payment source | Admitting: Family Medicine

## 2018-06-01 ENCOUNTER — Encounter: Payer: Self-pay | Admitting: Family Medicine

## 2018-06-01 VITALS — BP 118/92 | HR 104 | Temp 99.8°F | Ht 63.0 in | Wt 172.0 lb

## 2018-06-01 DIAGNOSIS — J111 Influenza due to unidentified influenza virus with other respiratory manifestations: Secondary | ICD-10-CM | POA: Diagnosis not present

## 2018-06-01 DIAGNOSIS — R52 Pain, unspecified: Secondary | ICD-10-CM | POA: Diagnosis not present

## 2018-06-01 DIAGNOSIS — K5901 Slow transit constipation: Secondary | ICD-10-CM | POA: Diagnosis not present

## 2018-06-01 LAB — POCT INFLUENZA A/B
INFLUENZA A, POC: NEGATIVE
Influenza B, POC: NEGATIVE

## 2018-06-01 MED ORDER — OSELTAMIVIR PHOSPHATE 75 MG PO CAPS: 75.0000 mg | ORAL_CAPSULE | Freq: Two times a day (BID) | ORAL | 0 refills | Status: DC

## 2018-06-01 MED FILL — OSELTAMIVIR PHOS 75 MG CAP: 75 | 5 days supply | Qty: 10 | Fill #0

## 2018-06-01 NOTE — Patient Instructions (Signed)

## 2018-06-01 NOTE — Progress Notes (Signed)
Patient ID: Brooke [name], female    DOB: 22-Mar-1971  Age: 48 y.o. MRN: [id number]    Subjective:  Subjective  HPI Brooke [name] presents for nausea , headaches x 2 days   + fever   She also has had a lot of trouble with constipation.    Review of Systems  Constitutional: Positive for chills and fever. Negative for appetite change, diaphoresis, fatigue and unexpected weight change.  HENT: Positive for congestion and postnasal drip. Negative for sinus pressure, sinus pain, sneezing and sore throat.   Eyes: Negative for pain, redness and visual disturbance.  Respiratory: Negative for cough, chest tightness, shortness of breath and wheezing.   Cardiovascular: Negative for chest pain, palpitations and leg swelling.  Gastrointestinal: Positive for constipation. Negative for abdominal distention, abdominal pain and diarrhea.  Endocrine: Negative for cold intolerance, heat intolerance, polydipsia, polyphagia and polyuria.  Genitourinary: Negative for difficulty urinating, dysuria and frequency.  Neurological: Positive for headaches. Negative for dizziness, light-headedness and numbness.    History Past Medical History:  Diagnosis Date  . Anxiety   . Depression   . Hyperglycemia 03/05/2013  . Incontinence   . Lumbar stress fracture   . Osteoporosis     She has a past surgical history that includes Rhinoplasty; Tonsillectomy; and Sinus endo w/fusion.   Her family history includes Diabetes in her father, paternal aunt, and paternal grandmother; Hyperlipidemia in her father, mother, and paternal grandmother; Hypertension in her father, paternal grandmother, and paternal uncle; Mental illness in her sister; Thyroid disease in her mother.She reports that she has never smoked. She has never used smokeless tobacco. She reports that she does not drink alcohol or use drugs.  Current Outpatient Medications on File Prior to Visit  Medication Sig Dispense Refill  . alendronate (FOSAMAX) 70  MG tablet Take 1 tablet (70 mg total) by mouth every 7 (seven) days. Take with a full glass of water on an empty stomach. (Patient not taking: Reported on 04/28/2018) 12 tablet 3  . ALPRAZolam (XANAX XR) 0.5 MG 24 hr tablet Take 0.5 mg by mouth daily. 1 in am if needed for anxiety and 2 at night for anxiety and sleep    . ARIPiprazole (ABILIFY) 2 MG tablet Take 1 tablet (2 mg total) by mouth daily. 90 tablet 0  . calcium citrate-vitamin D (CITRACAL+D) 315-200 MG-UNIT per tablet Take 2 tablets by mouth daily.    . cefUROXime (CEFTIN) 500 MG tablet Take 1 tablet (500 mg total) by mouth 2 (two) times daily with a meal. (Patient not taking: Reported on 04/28/2018) 14 tablet 0  . cholecalciferol (VITAMIN D) 1000 UNITS tablet Take 1,000 Units by mouth daily.    . Cranberry 1000 MG CAPS Take 1 capsule by mouth every evening.     . dicyclomine (BENTYL) 20 MG tablet Take 1 tablet (20 mg total) by mouth 3 (three) times daily as needed for spasms (abdominal cramping). 20 tablet 0  . HYDROcodone-homatropine (HYCODAN) 5-1.5 MG/5ML syrup Take 5 mLs by mouth at bedtime as needed for cough. (Patient not taking: Reported on 04/28/2018) 120 mL 0  . ibuprofen (ADVIL,MOTRIN) 200 MG tablet Take 400 mg by mouth every 6 (six) hours as needed for moderate pain.    Marland Kitchen ketoconazole (NIZORAL) 2 % cream Apply to feet daily for 6 weeks. 60 g 0  . Liraglutide -Weight Management (SAXENDA) 18 MG/3ML SOPN Inject 3 mg into the skin daily. 10 pen 1  . ondansetron (ZOFRAN ODT) 4 MG disintegrating tablet Take  1 tablet (4 mg total) by mouth every 8 (eight) hours as needed for nausea or vomiting. 20 tablet 0  . PARoxetine (PAXIL) 40 MG tablet Take 2 tablets (80 mg total) by mouth at bedtime. 180 tablet 1  . propranolol (INDERAL) 20 MG tablet Take 1-2 tablets (20-40 mg total) by mouth 2 (two) times daily as needed. 120 tablet 1  . topiramate (TOPAMAX) 50 MG tablet Take 2 tablets (100 mg total) by mouth 2 (two) times daily. 360 tablet 1  .  traZODone (DESYREL) 25 mg TABS Take 25-50 mg by mouth at bedtime as needed for sleep.     Marland Kitchen zolpidem (AMBIEN) 10 MG tablet Take 10 mg by mouth at bedtime as needed for sleep.     No current facility-administered medications on file prior to visit.      Objective:  Objective  Physical Exam Vitals signs and nursing note reviewed.  Constitutional:      General: She is not in acute distress.    Appearance: She is well-developed.  HENT:     Right Ear: External ear normal.     Left Ear: External ear normal.     Nose: Congestion present.     Right Turbinates: Swollen.     Left Turbinates: Swollen.     Right Sinus: No maxillary sinus tenderness or frontal sinus tenderness.     Left Sinus: No maxillary sinus tenderness or frontal sinus tenderness.     Comments: Clear drainage Eyes:     Pupils: Pupils are equal, round, and reactive to light.  Neck:     Musculoskeletal: Normal range of motion and neck supple.  Cardiovascular:     Rate and Rhythm: Normal rate and regular rhythm.     Heart sounds: Normal heart sounds. No murmur.  Pulmonary:     Effort: Pulmonary effort is normal. No respiratory distress.     Breath sounds: Normal breath sounds. No wheezing or rales.  Chest:     Chest wall: No tenderness.  Abdominal:     General: There is no distension.     Tenderness: There is no abdominal tenderness.  Neurological:     Mental Status: She is alert and oriented to person, place, and time.  Psychiatric:        Behavior: Behavior normal.        Thought Content: Thought content normal.        Judgment: Judgment normal.    BP (!) 118/92   Pulse (!) 104   Temp 99.8 F (37.7 C)   Ht 5\' 3"  (1.6 m)   Wt 172 lb (78 kg)   SpO2 98%   BMI 30.47 kg/m  Wt Readings from Last 3 Encounters:  06/01/18 172 lb (78 kg)  02/14/18 184 lb 3.2 oz (83.6 kg)  02/07/18 182 lb 8 oz (82.8 kg)     Lab Results  Component Value Date   WBC 3.5 (L) 04/28/2018   HGB 14.1 04/28/2018   HCT 43.8  04/28/2018   PLT 198 04/28/2018   GLUCOSE 116 (H) 04/28/2018   CHOL 163 01/07/2016   TRIG 130.0 01/07/2016   HDL 44.00 01/07/2016   LDLDIRECT 130.6 09/05/2009   LDLCALC 93 01/07/2016   ALT 21 04/28/2018   AST 21 04/28/2018   NA 141 04/28/2018   K 4.0 04/28/2018   CL 112 (H) 04/28/2018   CREATININE 1.01 (H) 04/28/2018   BUN 14 04/28/2018   CO2 23 04/28/2018   TSH 1.50 06/27/2017   HGBA1C 5.5  03/05/2013   MICROALBUR 0.6 03/05/2013    Nm Hepato W/eject Fract  Result Date: 05/19/2018 CLINICAL DATA:  Right upper quadrant pain with nausea EXAM: NUCLEAR MEDICINE HEPATOBILIARY IMAGING WITH GALLBLADDER EF VIEWS: Anterior right upper quadrant RADIOPHARMACEUTICALS:  5.4 mCi Tc-66m  Choletec IV COMPARISON:  None. FINDINGS: Liver uptake of radiotracer is homogeneous. There is prompt visualization of gallbladder and small bowel, indicating patency of the cystic and common bile ducts. The patient consumed 8 ounces of Ensure orally with calculation of the computer generated ejection fraction of radiotracer from the gallbladder. The patient did experience abdominal discomfort with the oral Ensure consumption. The computer generated ejection fraction of radiotracer from the gallbladder is toward the lower limits of normal at 38%, normal greater than 33% using the oral agent. IMPRESSION: Ejection fraction of radiotracer from the gallbladder is within normal limits although toward the lower limits of normal at 38%, normal greater than 33%. Patient did experience a degree of abdominal discomfort with the oral Ensure consumption. Cystic and common bile ducts are patent as is evidenced by visualization of gallbladder and small bowel. Electronically Signed   By: Lowella Grip III M.D.   On: 05/19/2018 10:55     Assessment & Plan:  Plan  I am having Kijana A. Buccheri start on oseltamivir. I am also having her maintain her traZODone, calcium citrate-vitamin D, cholecalciferol, Cranberry, ibuprofen,  alendronate, ketoconazole, cefUROXime, HYDROcodone-homatropine, Liraglutide -Weight Management, ARIPiprazole, zolpidem, ALPRAZolam, propranolol, topiramate, PARoxetine, ondansetron, and dicyclomine.  Meds ordered this encounter  Medications  . oseltamivir (TAMIFLU) 75 MG capsule    Sig: Take 1 capsule (75 mg total) by mouth 2 (two) times daily.    Dispense:  10 capsule    Refill:  0    Problem List Items Addressed This Visit    None    Visit Diagnoses    Influenza    -  Primary   Relevant Medications   oseltamivir (TAMIFLU) 75 MG capsule   Body aches       Relevant Orders   POCT Influenza A/B (Completed)   Slow transit constipation       Relevant Orders   Ambulatory referral to Gastroenterology      Follow-up: Return if symptoms worsen or fail to improve.  Ann Held, DO

## 2018-06-05 MED FILL — SAXENDA 18 MG/3 ML PEN: 18 | 30 days supply | Qty: 15 | Fill #1

## 2018-06-07 ENCOUNTER — Encounter: Payer: Self-pay | Admitting: Gastroenterology

## 2018-06-12 ENCOUNTER — Telehealth: Payer: Self-pay

## 2018-06-12 DIAGNOSIS — Z0279 Encounter for issue of other medical certificate: Secondary | ICD-10-CM

## 2018-06-12 NOTE — Telephone Encounter (Signed)
PA initiated via Covermymeds; KEY: [id number]. Awaiting determination.

## 2018-06-14 NOTE — Telephone Encounter (Signed)
PA approved.  The request has been approved. The authorization is effective for a maximum of 12 fills from 06/25/2018 to 06/25/2019, as long as the member is enrolled in their current health plan. The request was approved as submitted. This request is approved for 16mL (5 pens) per month. A written notification letter will follow with additional details.

## 2018-06-19 ENCOUNTER — Ambulatory Visit: Payer: No Typology Code available for payment source | Admitting: Psychiatry

## 2018-06-27 ENCOUNTER — Ambulatory Visit: Payer: Self-pay | Admitting: Gastroenterology

## 2018-07-17 ENCOUNTER — Other Ambulatory Visit: Payer: Self-pay | Admitting: Psychiatry

## 2018-07-17 MED FILL — TOPIRAMATE 50 MG TABLET: 50 | 90 days supply | Qty: 360 | Fill #1

## 2018-07-17 MED FILL — PARoxetine HCL 40 MG TABS: 40 | 90 days supply | Qty: 180 | Fill #1

## 2018-07-17 MED FILL — ARIPIPRAZOLE 2 MG TABS: 2 | 90 days supply | Qty: 90 | Fill #0

## 2018-07-18 ENCOUNTER — Telehealth: Payer: Self-pay

## 2018-07-18 NOTE — Telephone Encounter (Signed)

## 2018-07-19 ENCOUNTER — Ambulatory Visit: Payer: Self-pay | Admitting: Gastroenterology

## 2018-07-19 MED FILL — ZOLPIDEM TARTRATE 10 MG TAB: 10 | 30 days supply | Qty: 30 | Fill #1

## 2018-07-19 MED FILL — SAXENDA 18 MG/3 ML PEN: 18 | 30 days supply | Qty: 15 | Fill #2

## 2018-07-21 ENCOUNTER — Other Ambulatory Visit: Payer: Self-pay

## 2018-07-21 MED ORDER — TRAZODONE HCL 50 MG PO TABS: 100.0000 mg | ORAL_TABLET | Freq: Every day | ORAL | 1 refills | Status: DC

## 2018-07-21 MED ORDER — ALPRAZOLAM ER 0.5 MG PO TB24: ORAL_TABLET | ORAL | 2 refills | Status: DC

## 2018-07-21 MED FILL — traZODone HCL 50 MG TABS: 50 | 30 days supply | Qty: 60 | Fill #0

## 2018-07-21 NOTE — Progress Notes (Signed)
Refill request form [facility] for Alprazolam ER 0.5mg  and Trazodone 50mg  2 at hs. Sent to pharmacy  Scheduled for follow up next week with provider

## 2018-07-24 MED FILL — ALPRAZolam XR 0.5 MG TB24: 0.5 | 30 days supply | Qty: 90 | Fill #0

## 2018-07-25 ENCOUNTER — Ambulatory Visit: Payer: No Typology Code available for payment source | Admitting: Psychiatry

## 2018-08-22 ENCOUNTER — Other Ambulatory Visit: Payer: Self-pay | Admitting: Family Medicine

## 2018-08-22 DIAGNOSIS — E669 Obesity, unspecified: Secondary | ICD-10-CM

## 2018-08-23 ENCOUNTER — Encounter: Payer: Self-pay | Admitting: Family Medicine

## 2018-08-23 ENCOUNTER — Other Ambulatory Visit: Payer: Self-pay

## 2018-08-23 ENCOUNTER — Ambulatory Visit (INDEPENDENT_AMBULATORY_CARE_PROVIDER_SITE_OTHER): Payer: No Typology Code available for payment source | Admitting: Family Medicine

## 2018-08-23 DIAGNOSIS — B353 Tinea pedis: Secondary | ICD-10-CM | POA: Diagnosis not present

## 2018-08-23 DIAGNOSIS — E669 Obesity, unspecified: Secondary | ICD-10-CM | POA: Diagnosis not present

## 2018-08-23 MED ORDER — LIRAGLUTIDE -WEIGHT MANAGEMENT 18 MG/3ML ~~LOC~~ SOPN: 3.0000 mg | PEN_INJECTOR | Freq: Every day | SUBCUTANEOUS | 1 refills | Status: DC

## 2018-08-23 MED ORDER — KETOCONAZOLE 2 % EX CREA: TOPICAL_CREAM | CUTANEOUS | 3 refills | Status: DC

## 2018-08-23 MED FILL — SAXENDA 18 MG/3 ML PEN: 18 | 60 days supply | Qty: 30 | Fill #0

## 2018-08-23 MED FILL — KETOCONAZOLE 2% CREAM: 2 | 42 days supply | Qty: 60 | Fill #0

## 2018-08-23 NOTE — Progress Notes (Signed)
Virtual Visit via Video Note  I connected with Brooke [name] on 08/23/18 at  9:15 AM EDT by a video enabled telemedicine application and verified that I am speaking with the correct person using two identifiers.   I discussed the limitations of evaluation and management by telemedicine and the availability of in person appointments. The patient expressed understanding and agreed to proceed.  History of Present Illness: Pt is home and needs a refill on her saxenda.  It is working well for her -- No side effects She is also asking for a refill of the fungus cream for her feet  No other complaints.     Observations/Objective: 165 lbs  195 starting weight--- no other vs obtained due to virtual visit   Assessment and Plan: 1. Obesity (BMI 30.0-34.9) con't saxenda and diet  rto 3 months or sooner prn  - Liraglutide -Weight Management (SAXENDA) 18 MG/3ML SOPN; Inject 3 mg into the skin daily.  Dispense: 30 pen; Refill: 1  2. Tinea pedis, unspecified laterality Refill cream----stable--- flares when she works a lot  - ketoconazole (NIZORAL) 2 % cream; Apply to feet daily for 6 weeks.  Dispense: 60 g; Refill: 3   Follow Up Instructions:    I discussed the assessment and treatment plan with the patient. The patient was provided an opportunity to ask questions and all were answered. The patient agreed with the plan and demonstrated an understanding of the instructions.   The patient was advised to call back or seek an in-person evaluation if the symptoms worsen or if the condition fails to improve as anticipated.  I provided 25 minutes of non-face-to-face time during this encounter.   Ann Held, DO

## 2018-10-13 MED FILL — ALPRAZolam XR 0.5 MG TB24: 0.5 | 30 days supply | Qty: 90 | Fill #1

## 2018-10-30 ENCOUNTER — Other Ambulatory Visit: Payer: Self-pay | Admitting: Psychiatry

## 2018-10-30 MED ORDER — ZOLPIDEM TARTRATE 10 MG PO TABS: 10.0000 mg | ORAL_TABLET | Freq: Every evening | ORAL | 1 refills | Status: DC | PRN

## 2018-10-30 NOTE — Telephone Encounter (Signed)
Pt needs appt

## 2018-10-31 MED FILL — ZOLPIDEM TARTRATE 10 MG TAB: 10 | 30 days supply | Qty: 30 | Fill #0

## 2018-10-31 MED FILL — ARIPIPRAZOLE 2 MG TABS: 2 | 90 days supply | Qty: 90 | Fill #0

## 2018-10-31 NOTE — Telephone Encounter (Signed)
Patient made ande an appointment in august

## 2018-11-06 ENCOUNTER — Telehealth: Payer: Self-pay | Admitting: Psychiatry

## 2018-11-06 ENCOUNTER — Encounter: Payer: Self-pay | Admitting: Family Medicine

## 2018-11-06 DIAGNOSIS — M25551 Pain in right hip: Secondary | ICD-10-CM

## 2018-11-06 NOTE — Telephone Encounter (Signed)
Patient wants to talk with Dr. [name] regarding stress levels and anxiety.  Advised patient that Dr. [name] is on vacation.

## 2018-11-07 ENCOUNTER — Other Ambulatory Visit: Payer: Self-pay | Admitting: Psychiatry

## 2018-11-07 NOTE — Telephone Encounter (Signed)
Tried to reach pt but no answer and mail box full.

## 2018-11-07 NOTE — Telephone Encounter (Signed)
I don't see the note I tried to send.  Did you get it.  It said I don't want her to be out of meds.  She got [place] recently.  Is she wanting something else?

## 2018-11-07 NOTE — Telephone Encounter (Signed)
Pt picked up Ambien June 30.  I do not intend for her to run out of any meds before her visit in August.  She has had trazodone in the past and could get it again at 50 mg HS.  She should continue the Abilify and Paxil.  Make sure she's taking it.  I know the household and there is a lot of stress there which meds won't fix.  She's a Marine scientist.  Is she wanting something stronger like quetiapine 25 for sleep?

## 2018-11-08 NOTE — Telephone Encounter (Signed)
Ok to DC trazodone and start quetiapine 25 mg hs and increase Abilify to 2 mg daily.

## 2018-11-09 ENCOUNTER — Other Ambulatory Visit: Payer: Self-pay

## 2018-11-09 MED ORDER — QUETIAPINE FUMARATE 25 MG PO TABS: 25.0000 mg | ORAL_TABLET | Freq: Every evening | ORAL | 0 refills | Status: DC | PRN

## 2018-11-09 MED ORDER — ARIPIPRAZOLE 5 MG PO TABS: 5.0000 mg | ORAL_TABLET | Freq: Every day | ORAL | 0 refills | Status: DC

## 2018-11-09 MED FILL — predniSONE 10 MG TABS: 10 | 12 days supply | Qty: 21 | Fill #0

## 2018-11-09 MED FILL — ARIPiprazole 5 MG TABS: 5 | 90 days supply | Qty: 90 | Fill #0

## 2018-11-09 MED FILL — QUETIAPINE 25 MG TABLET: 25 | 90 days supply | Qty: 90 | Fill #0

## 2018-11-09 NOTE — Telephone Encounter (Signed)
Pt aware and verbalized understanding. Instructed to use up her Abilify 2 mg take 2 at hs. Sent in new rx for Abilify 5 mg with quetiapine 25 mg.

## 2018-11-09 NOTE — Telephone Encounter (Signed)
Thank you.  If she has 90 day supply of 2 mg Abilify increase to 4mg  daily.  If she has little left then call in Abilify 5 mg 1 tablet daily.

## 2018-11-20 MED FILL — SAXENDA 18 MG/3 ML PEN: 18 | 60 days supply | Qty: 30 | Fill #1

## 2018-11-22 ENCOUNTER — Other Ambulatory Visit: Payer: Self-pay | Admitting: Psychiatry

## 2018-11-22 MED FILL — ALPRAZolam XR 0.5 MG TB24: 0.5 | 30 days supply | Qty: 90 | Fill #2

## 2018-11-23 MED FILL — PARoxetine HCL 40 MG TABS: 40 | 90 days supply | Qty: 180 | Fill #0

## 2018-11-24 ENCOUNTER — Encounter: Payer: Self-pay | Admitting: Family Medicine

## 2018-11-24 ENCOUNTER — Ambulatory Visit (INDEPENDENT_AMBULATORY_CARE_PROVIDER_SITE_OTHER): Payer: No Typology Code available for payment source | Admitting: Family Medicine

## 2018-11-24 ENCOUNTER — Other Ambulatory Visit (HOSPITAL_COMMUNITY)
Admission: RE | Admit: 2018-11-24 | Discharge: 2018-11-24 | Disposition: A | Discharge status: Home / Self Care | Payer: No Typology Code available for payment source | Source: Ambulatory Visit | Attending: Family Medicine | Admitting: Family Medicine

## 2018-11-24 ENCOUNTER — Other Ambulatory Visit: Payer: Self-pay | Admitting: Family Medicine

## 2018-11-24 ENCOUNTER — Other Ambulatory Visit: Payer: Self-pay

## 2018-11-24 VITALS — BP 117/97 | HR 99 | Temp 98.5°F | Resp 18 | Ht 63.0 in | Wt 177.6 lb

## 2018-11-24 DIAGNOSIS — Z0000 Encounter for general adult medical examination without abnormal findings: Secondary | ICD-10-CM

## 2018-11-24 DIAGNOSIS — E2839 Other primary ovarian failure: Secondary | ICD-10-CM | POA: Diagnosis not present

## 2018-11-24 DIAGNOSIS — Z23 Encounter for immunization: Secondary | ICD-10-CM

## 2018-11-24 DIAGNOSIS — B49 Unspecified mycosis: Secondary | ICD-10-CM | POA: Diagnosis not present

## 2018-11-24 DIAGNOSIS — E669 Obesity, unspecified: Secondary | ICD-10-CM | POA: Diagnosis not present

## 2018-11-24 DIAGNOSIS — Z1231 Encounter for screening mammogram for malignant neoplasm of breast: Secondary | ICD-10-CM

## 2018-11-24 MED ORDER — SAXENDA 18 MG/3ML ~~LOC~~ SOPN: 3.0000 mg | PEN_INJECTOR | Freq: Every day | SUBCUTANEOUS | 1 refills | Status: DC

## 2018-11-24 MED ORDER — TERBINAFINE HCL 250 MG PO TABS: 250.0000 mg | ORAL_TABLET | Freq: Every day | ORAL | 2 refills | Status: DC

## 2018-11-24 MED FILL — TERBINAFINE HCL 250 MG TAB: 250 | 30 days supply | Qty: 30 | Fill #0

## 2018-11-24 NOTE — Progress Notes (Signed)
Subjective:   +  Brooke [name] is a 48 y.o. female and is here for a comprehensive physical exam. The patient reports problems - athletes foot-- cream not working .  Social History   Socioeconomic History  . Marital status: Married    Spouse name: Not on file  . Number of children: Not on file  . Years of education: 68  . Highest education level: Not on file  Occupational History  . Occupation: RN/OR    Employer: [employer]  . Financial resource strain: Not on file  . Food insecurity    Worry: Not on file    Inability: Not on file  . Transportation needs    Medical: Not on file    Non-medical: Not on file  Tobacco Use  . Smoking status: Never Smoker  . Smokeless tobacco: Never Used  Substance and Sexual Activity  . Alcohol use: No    Alcohol/week: 0.0 standard drinks  . Drug use: No  . Sexual activity: Yes    Partners: Male    Birth control/protection: Surgical    Comment: Husband's vasectomy  Lifestyle  . Physical activity    Days per week: Not on file    Minutes per session: Not on file  . Stress: Not on file  Relationships  . Social Herbalist on phone: Not on file    Gets together: Not on file    Attends religious service: Not on file    Active member of club or organization: Not on file    Attends meetings of clubs or organizations: Not on file    Relationship status: Not on file  . Intimate partner violence    Fear of current or ex partner: Not on file    Emotionally abused: Not on file    Physically abused: Not on file    Forced sexual activity: Not on file  Other Topics Concern  . Not on file  Social History Narrative  . Not on file   Health Maintenance  Topic Date Due  . HIV Screening  10/04/1983  . MAMMOGRAM  10/04/2018  . COLONOSCOPY  10/04/2018  . PAP SMEAR-Modifier  01/06/2019  . INFLUENZA VACCINE  12/02/2018  . TETANUS/TDAP  03/06/2023    The following portions of the patient's history were reviewed and  updated as appropriate:  She  has a past medical history of Anxiety, Depression, Fatty liver, Hyperglycemia (03/05/2013), Incontinence, Lumbar stress fracture, and Osteoporosis. She does not have any pertinent problems on file. She  has a past surgical history that includes Rhinoplasty; Tonsillectomy; and Sinus endo w/fusion. Her family history includes Diabetes in her father, paternal aunt, and paternal grandmother; Hyperlipidemia in her father, mother, and paternal grandmother; Hypertension in her father, paternal grandmother, and paternal uncle; Mental illness in her sister; Thyroid disease in her mother. She  reports that she has never smoked. She has never used smokeless tobacco. She reports that she does not drink alcohol or use drugs. She has a current medication list which includes the following prescription(s): alprazolam, aripiprazole, calcium citrate-vitamin d, cholecalciferol, cranberry, dicyclomine, hydrocodone-homatropine, ibuprofen, ketoconazole, liraglutide -weight management, ondansetron, paroxetine, propranolol, quetiapine, topiramate, trazodone, zolpidem, alendronate, and cefuroxime. Current Outpatient Medications on File Prior to Visit  Medication Sig Dispense Refill  . ALPRAZolam (XANAX XR) 0.5 MG 24 hr tablet 1 in am if needed for anxiety and 2 at night for anxiety and sleep 90 tablet 2  . ARIPiprazole (ABILIFY) 5 MG tablet Take 1  tablet (5 mg total) by mouth daily. 90 tablet 0  . calcium citrate-vitamin D (CITRACAL+D) 315-200 MG-UNIT per tablet Take 2 tablets by mouth daily.    . cholecalciferol (VITAMIN D) 1000 UNITS tablet Take 1,000 Units by mouth daily.    . Cranberry 1000 MG CAPS Take 1 capsule by mouth every evening.     . dicyclomine (BENTYL) 20 MG tablet Take 1 tablet (20 mg total) by mouth 3 (three) times daily as needed for spasms (abdominal cramping). 20 tablet 0  . HYDROcodone-homatropine (HYCODAN) 5-1.5 MG/5ML syrup Take 5 mLs by mouth at bedtime as needed for cough.  120 mL 0  . ibuprofen (ADVIL,MOTRIN) 200 MG tablet Take 400 mg by mouth every 6 (six) hours as needed for moderate pain.    Marland Kitchen ketoconazole (NIZORAL) 2 % cream Apply to feet daily for 6 weeks. 60 g 3  . Liraglutide -Weight Management (SAXENDA) 18 MG/3ML SOPN Inject 3 mg into the skin daily. 30 pen 1  . ondansetron (ZOFRAN ODT) 4 MG disintegrating tablet Take 1 tablet (4 mg total) by mouth every 8 (eight) hours as needed for nausea or vomiting. 20 tablet 0  . PARoxetine (PAXIL) 40 MG tablet TAKE 2 TABLETS (80 MG TOTAL) BY MOUTH AT BEDTIME. 180 tablet 0  . propranolol (INDERAL) 20 MG tablet Take 1-2 tablets (20-40 mg total) by mouth 2 (two) times daily as needed. 120 tablet 1  . QUEtiapine (SEROQUEL) 25 MG tablet Take 1 tablet (25 mg total) by mouth at bedtime as needed. 90 tablet 0  . topiramate (TOPAMAX) 50 MG tablet Take 2 tablets (100 mg total) by mouth 2 (two) times daily. 360 tablet 1  . traZODone (DESYREL) 50 MG tablet Take 2 tablets (100 mg total) by mouth at bedtime. 60 tablet 1  . zolpidem (AMBIEN) 10 MG tablet Take 1 tablet (10 mg total) by mouth at bedtime as needed for sleep. 30 tablet 1  . alendronate (FOSAMAX) 70 MG tablet Take 1 tablet (70 mg total) by mouth every 7 (seven) days. Take with a full glass of water on an empty stomach. (Patient not taking: Reported on 04/28/2018) 12 tablet 3  . cefUROXime (CEFTIN) 500 MG tablet Take 1 tablet (500 mg total) by mouth 2 (two) times daily with a meal. (Patient not taking: Reported on 04/28/2018) 14 tablet 0   No current facility-administered medications on file prior to visit.    She has No Known Allergies..  Review of Systems Review of Systems  Constitutional: Negative for activity change, appetite change and fatigue.  HENT: Negative for hearing loss, congestion, tinnitus and ear discharge.  dentist q54m Eyes: Negative for visual disturbance (see optho q1y -- vision corrected to 20/20 with glasses).  Respiratory: Negative for cough, chest  tightness and shortness of breath.   Cardiovascular: Negative for chest pain, palpitations and leg swelling.  Gastrointestinal: Negative for abdominal pain, diarrhea, constipation and abdominal distention.  Genitourinary: Negative for urgency, frequency, decreased urine volume and difficulty urinating.  Musculoskeletal: Negative for back pain, arthralgias and gait problem.  Skin: Negative for color change, pallor and rash.  Neurological: Negative for dizziness, light-headedness, numbness and headaches.  Hematological: Negative for adenopathy. Does not bruise/bleed easily.  Psychiatric/Behavioral: Negative for suicidal ideas, confusion, sleep disturbance, self-injury, dysphoric mood, decreased concentration and agitation.       Objective:    BP (!) 117/97 (BP Location: Right Arm, Patient Position: Sitting, Cuff Size: Normal)   Pulse 99   Temp 98.5 F (36.9 C) (Oral)  Resp 18   Ht 5\' 3"  (1.6 m)   Wt 177 lb 9.6 oz (80.6 kg)   SpO2 100%   BMI 31.46 kg/m  General appearance: alert, cooperative, appears stated age and no distress Head: Normocephalic, without obvious abnormality, atraumatic Eyes: conjunctivae/corneas clear. PERRL, EOM's intact. Fundi benign. Ears: normal TM's and external ear canals both ears Nose: Nares normal. Septum midline. Mucosa normal. No drainage or sinus tenderness. Throat: lips, mucosa, and tongue normal; teeth and gums normal Neck: no adenopathy, no carotid bruit, no JVD, supple, symmetrical, trachea midline and thyroid not enlarged, symmetric, no tenderness/mass/nodules Back: symmetric, no curvature. ROM normal. No CVA tenderness. Lungs: clear to auscultation bilaterally Breasts: normal appearance, no masses or tenderness Heart: regular rate and rhythm, S1, S2 normal, no murmur, click, rub or gallop Abdomen: soft, non-tender; bowel sounds normal; no masses,  no organomegaly Pelvic: cervix normal in appearance, external genitalia normal, no adnexal masses  or tenderness, no cervical motion tenderness, rectovaginal septum normal, uterus normal size, shape, and consistency, vagina normal without discharge and pap done Extremities: extremities normal, atraumatic, no cyanosis or edema Pulses: 2+ and symmetric Skin: Skin color, texture, turgor normal. No rashes or lesions Lymph nodes: Cervical, supraclavicular, and axillary nodes normal. Neurologic: Alert and oriented X 3, normal strength and tone. Normal symmetric reflexes. Normal coordination and gait    Assessment:    Healthy female exam.      Plan:    ghm utd Check labs  See After Visit Summary for Counseling Recommendations    1. Estrogen deficiency  - DG Bone Density; Future  2. Preventative health care See above - Ambulatory referral to Gastroenterology - Varicella-zoster vaccine IM (Shingrix) - Cytology - PAP( [place]) - CBC with Differential/Platelet - Comprehensive metabolic panel - Lipid panel - TSH  3. Obesity (BMI 30.0-34.9) Diet and exercise  Refill saxenda  - Liraglutide -Weight Management (SAXENDA) 18 MG/3ML SOPN; Inject 3 mg into the skin daily.  Dispense: 30 pen; Refill: 1  4. Fungus infection  Labs in 6 weeks and 3 months  - terbinafine (LAMISIL) 250 MG tablet; Take 1 tablet (250 mg total) by mouth daily.  Dispense: 30 tablet; Refill: 2

## 2018-11-24 NOTE — Patient Instructions (Signed)

## 2018-11-25 LAB — COMPREHENSIVE METABOLIC PANEL
AG Ratio: 2.2 (calc) (ref 1.0–2.5)
ALT: 18 U/L (ref 6–29)
AST: 18 U/L (ref 10–35)
Albumin: 4.3 g/dL (ref 3.6–5.1)
Alkaline phosphatase (APISO): 85 U/L (ref 37–153)
BUN: 19 mg/dL (ref 7–25)
CO2: 27 mmol/L (ref 20–32)
Calcium: 9.5 mg/dL (ref 8.6–10.4)
Chloride: 107 mmol/L (ref 98–110)
Creat: 0.8 mg/dL (ref 0.50–1.05)
Globulin: 2 g/dL (calc) (ref 1.9–3.7)
Glucose, Bld: 85 mg/dL (ref 65–99)
Potassium: 4.5 mmol/L (ref 3.5–5.3)
Sodium: 141 mmol/L (ref 135–146)
Total Bilirubin: 0.4 mg/dL (ref 0.2–1.2)
Total Protein: 6.3 g/dL (ref 6.1–8.1)

## 2018-11-25 LAB — CBC WITH DIFFERENTIAL/PLATELET
Absolute Monocytes: 494 cells/uL (ref 200–950)
Basophils Absolute: 33 cells/uL (ref 0–200)
Basophils Relative: 0.5 %
Eosinophils Absolute: 163 cells/uL (ref 15–500)
Eosinophils Relative: 2.5 %
HCT: 43.4 % (ref 35.0–45.0)
Hemoglobin: 15.2 g/dL (ref 11.7–15.5)
Lymphs Abs: 2217 cells/uL (ref 850–3900)
MCH: 31.2 pg (ref 27.0–33.0)
MCHC: 35 g/dL (ref 32.0–36.0)
MCV: 89.1 fL (ref 80.0–100.0)
MPV: 10.3 fL (ref 7.5–12.5)
Monocytes Relative: 7.6 %
Neutro Abs: 3595 cells/uL (ref 1500–7800)
Neutrophils Relative %: 55.3 %
Platelets: 231 10*3/uL (ref 140–400)
RBC: 4.87 10*6/uL (ref 3.80–5.10)
RDW: 13 % (ref 11.0–15.0)
Total Lymphocyte: 34.1 %
WBC: 6.5 10*3/uL (ref 3.8–10.8)

## 2018-11-25 LAB — LIPID PANEL
Cholesterol: 233 mg/dL — ABNORMAL HIGH (ref <200)
HDL: 52 mg/dL (ref >=50)
LDL Cholesterol (Calc): 158 mg/dL (calc) — ABNORMAL HIGH
Non-HDL Cholesterol (Calc): 181 mg/dL (calc) — ABNORMAL HIGH (ref <130)
Total CHOL/HDL Ratio: 4.5 (calc) (ref <5.0)
Triglycerides: 111 mg/dL (ref <150)

## 2018-11-25 LAB — TSH: TSH: 1.54 mIU/L

## 2018-11-27 ENCOUNTER — Telehealth: Payer: Self-pay | Admitting: Family Medicine

## 2018-11-27 ENCOUNTER — Other Ambulatory Visit: Payer: Self-pay | Admitting: Family Medicine

## 2018-11-27 DIAGNOSIS — E785 Hyperlipidemia, unspecified: Secondary | ICD-10-CM

## 2018-11-27 NOTE — Telephone Encounter (Signed)
Danielle Dess is calling from [facility] back she is returning the specimen it is not properly labeled. CB- [phone]

## 2018-11-27 NOTE — Telephone Encounter (Signed)
FYI

## 2018-11-29 ENCOUNTER — Other Ambulatory Visit: Payer: Self-pay | Admitting: Psychiatry

## 2018-11-30 LAB — CYTOLOGY - PAP: Diagnosis: NEGATIVE

## 2018-11-30 MED FILL — TOPIRAMATE 50 MG TABLET: 50 | 90 days supply | Qty: 360 | Fill #0

## 2018-12-06 ENCOUNTER — Encounter: Payer: Self-pay | Admitting: Family Medicine

## 2018-12-13 ENCOUNTER — Other Ambulatory Visit (HOSPITAL_BASED_OUTPATIENT_CLINIC_OR_DEPARTMENT_OTHER): Payer: No Typology Code available for payment source

## 2018-12-13 ENCOUNTER — Ambulatory Visit (HOSPITAL_BASED_OUTPATIENT_CLINIC_OR_DEPARTMENT_OTHER): Payer: No Typology Code available for payment source

## 2018-12-19 ENCOUNTER — Encounter (HOSPITAL_BASED_OUTPATIENT_CLINIC_OR_DEPARTMENT_OTHER): Payer: Self-pay

## 2018-12-19 ENCOUNTER — Ambulatory Visit (HOSPITAL_BASED_OUTPATIENT_CLINIC_OR_DEPARTMENT_OTHER)
Admission: RE | Admit: 2018-12-19 | Discharge: 2018-12-19 | Disposition: A | Discharge status: Home / Self Care | Payer: No Typology Code available for payment source | Source: Ambulatory Visit | Attending: Family Medicine | Admitting: Family Medicine

## 2018-12-19 ENCOUNTER — Other Ambulatory Visit: Payer: Self-pay

## 2018-12-19 DIAGNOSIS — E2839 Other primary ovarian failure: Secondary | ICD-10-CM | POA: Diagnosis present

## 2018-12-19 DIAGNOSIS — Z1231 Encounter for screening mammogram for malignant neoplasm of breast: Secondary | ICD-10-CM | POA: Insufficient documentation

## 2018-12-21 ENCOUNTER — Other Ambulatory Visit: Payer: Self-pay | Admitting: Family Medicine

## 2018-12-21 DIAGNOSIS — R928 Other abnormal and inconclusive findings on diagnostic imaging of breast: Secondary | ICD-10-CM

## 2018-12-26 ENCOUNTER — Ambulatory Visit: Payer: No Typology Code available for payment source | Admitting: Psychiatry

## 2018-12-27 ENCOUNTER — Ambulatory Visit
Admission: RE | Admit: 2018-12-27 | Discharge: 2018-12-27 | Disposition: A | Discharge status: Home / Self Care | Payer: No Typology Code available for payment source | Source: Ambulatory Visit | Attending: Family Medicine | Admitting: Family Medicine

## 2018-12-27 ENCOUNTER — Other Ambulatory Visit: Payer: Self-pay | Admitting: Family Medicine

## 2018-12-27 ENCOUNTER — Other Ambulatory Visit: Payer: Self-pay

## 2018-12-27 DIAGNOSIS — R928 Other abnormal and inconclusive findings on diagnostic imaging of breast: Secondary | ICD-10-CM

## 2018-12-27 DIAGNOSIS — N6310 Unspecified lump in the right breast, unspecified quadrant: Secondary | ICD-10-CM

## 2018-12-27 HISTORY — PX: BREAST BIOPSY: SHX20

## 2019-01-01 ENCOUNTER — Other Ambulatory Visit: Payer: Self-pay | Admitting: Family Medicine

## 2019-01-02 ENCOUNTER — Ambulatory Visit
Admission: RE | Admit: 2019-01-02 | Discharge: 2019-01-02 | Disposition: A | Discharge status: Home / Self Care | Payer: No Typology Code available for payment source | Source: Ambulatory Visit | Attending: Family Medicine | Admitting: Family Medicine

## 2019-01-02 ENCOUNTER — Other Ambulatory Visit: Payer: Self-pay

## 2019-01-02 DIAGNOSIS — N6310 Unspecified lump in the right breast, unspecified quadrant: Secondary | ICD-10-CM

## 2019-01-26 ENCOUNTER — Other Ambulatory Visit: Payer: Self-pay | Admitting: Psychiatry

## 2019-01-26 MED FILL — traZODone HCL 50 MG TABS: 50 | 30 days supply | Qty: 60 | Fill #1

## 2019-01-26 MED FILL — ZOLPIDEM TARTRATE 10 MG TAB: 10 | 30 days supply | Qty: 30 | Fill #1

## 2019-01-29 ENCOUNTER — Ambulatory Visit (INDEPENDENT_AMBULATORY_CARE_PROVIDER_SITE_OTHER): Payer: No Typology Code available for payment source | Admitting: Psychiatry

## 2019-01-29 ENCOUNTER — Other Ambulatory Visit: Payer: Self-pay

## 2019-01-29 ENCOUNTER — Encounter: Payer: Self-pay | Admitting: Psychiatry

## 2019-01-29 DIAGNOSIS — F4001 Agoraphobia with panic disorder: Secondary | ICD-10-CM

## 2019-01-29 DIAGNOSIS — F3342 Major depressive disorder, recurrent, in full remission: Secondary | ICD-10-CM | POA: Diagnosis not present

## 2019-01-29 DIAGNOSIS — F411 Generalized anxiety disorder: Secondary | ICD-10-CM

## 2019-01-29 DIAGNOSIS — F5105 Insomnia due to other mental disorder: Secondary | ICD-10-CM | POA: Diagnosis not present

## 2019-01-29 MED ORDER — ALPRAZOLAM ER 0.5 MG PO TB24: ORAL_TABLET | ORAL | 0 refills | Status: DC

## 2019-01-29 MED FILL — ALPRAZolam XR 0.5 MG TB24: 0.5 | 90 days supply | Qty: 270 | Fill #0

## 2019-01-29 NOTE — Progress Notes (Signed)
JAHLIA READUS [id number] April 27, 1971 48 y.o.  Subjective:   Patient ID:  Brooke [name] is a 48 y.o. (DOB [birth year]) female.  Chief Complaint:  Chief Complaint  Patient presents with  . Anxiety    HPI Brooke [name] presents to the office today for follow-up of anxiety and insomnia  Last seen December 2019.  For anxiety and weight gain topiramate was increased off label to 200 mg daily.  For anxiety propranolol 20 to 40 mg twice daily was added as needed.    OK except for life events.  Still a lot of worry. Managed normally.  H agrees also.  Son's wife pregnant with another man's baby.  Real busy with 60 hours weekly.  Can't take Ambien at times bc of that.  Realizes she needs to take better self care.   . A little worse for the past month but working extra shifts and on call at night and 3 kids all under 83mons old at home.  Pt reports that mood is Anxious and describes anxiety as Moderate. Anxiety symptoms include: Excessive Worry, Panic Symptoms, heart racing. Worry can occur anytime.  Pt reports has difficulty falling asleep, has interrupted sleep and part is related to night shift call.  Rethinking that and plans to do better..  Pt reports that appetite is good. Pt reports that energy is no change and good. Concentration is down slightly. Suicidal thoughts:  denied by patient.  Pleased with meds.  Lost 30# on Saxenda and feels good about that and better mood.   Past Psychiatric Medication Trials:    Review of Systems:  Review of Systems  Neurological: Negative for dizziness, tremors and weakness.  Psychiatric/Behavioral: Positive for sleep disturbance. Negative for agitation, behavioral problems, confusion, decreased concentration, dysphoric mood, hallucinations, self-injury and suicidal ideas. The patient is nervous/anxious. The patient is not hyperactive.     Medications: I have reviewed the patient's current medications.  Current Outpatient Medications   Medication Sig Dispense Refill  . alendronate (FOSAMAX) 70 MG tablet Take 1 tablet (70 mg total) by mouth every 7 (seven) days. Take with a full glass of water on an empty stomach. 12 tablet 3  . ALPRAZolam (XANAX XR) 0.5 MG 24 hr tablet 1 in am if needed for anxiety and 2 at night for anxiety and sleep 90 tablet 2  . ARIPiprazole (ABILIFY) 5 MG tablet Take 1 tablet (5 mg total) by mouth daily. 90 tablet 0  . calcium citrate-vitamin D (CITRACAL+D) 315-200 MG-UNIT per tablet Take 2 tablets by mouth daily.    . cefUROXime (CEFTIN) 500 MG tablet Take 1 tablet (500 mg total) by mouth 2 (two) times daily with a meal. 14 tablet 0  . cholecalciferol (VITAMIN D) 1000 UNITS tablet Take 1,000 Units by mouth daily.    . Cranberry 1000 MG CAPS Take 1 capsule by mouth every evening.     . dicyclomine (BENTYL) 20 MG tablet Take 1 tablet (20 mg total) by mouth 3 (three) times daily as needed for spasms (abdominal cramping). 20 tablet 0  . HYDROcodone-homatropine (HYCODAN) 5-1.5 MG/5ML syrup Take 5 mLs by mouth at bedtime as needed for cough. 120 mL 0  . ibuprofen (ADVIL,MOTRIN) 200 MG tablet Take 400 mg by mouth every 6 (six) hours as needed for moderate pain.    Marland Kitchen ketoconazole (NIZORAL) 2 % cream Apply to feet daily for 6 weeks. 60 g 3  . Liraglutide -Weight Management (SAXENDA) 18 MG/3ML SOPN Inject 3 mg into the skin daily.  30 pen 1  . ondansetron (ZOFRAN ODT) 4 MG disintegrating tablet Take 1 tablet (4 mg total) by mouth every 8 (eight) hours as needed for nausea or vomiting. 20 tablet 0  . PARoxetine (PAXIL) 40 MG tablet TAKE 2 TABLETS (80 MG TOTAL) BY MOUTH AT BEDTIME. 180 tablet 0  . propranolol (INDERAL) 20 MG tablet Take 1-2 tablets (20-40 mg total) by mouth 2 (two) times daily as needed. 120 tablet 1  . QUEtiapine (SEROQUEL) 25 MG tablet Take 1 tablet (25 mg total) by mouth at bedtime as needed. 90 tablet 0  . terbinafine (LAMISIL) 250 MG tablet Take 1 tablet (250 mg total) by mouth daily. 30 tablet 2   . topiramate (TOPAMAX) 50 MG tablet TAKE 2 TABLETS (100 MG TOTAL) BY MOUTH 2 (TWO) TIMES DAILY. 360 tablet 1  . traZODone (DESYREL) 50 MG tablet Take 2 tablets (100 mg total) by mouth at bedtime. 60 tablet 1  . zolpidem (AMBIEN) 10 MG tablet Take 1 tablet (10 mg total) by mouth at bedtime as needed for sleep. 30 tablet 1   No current facility-administered medications for this visit.     Medication Side Effects: None  Allergies: No Known Allergies  Past Medical History:  Diagnosis Date  . Anxiety   . Depression   . Fatty liver    seen on [place] 04-2018  . Hyperglycemia 03/05/2013  . Incontinence   . Lumbar stress fracture   . Osteoporosis     Family History  Problem Relation Age of Onset  . Thyroid disease Mother   . Hyperlipidemia Mother   . Diabetes Father   . Hypertension Father   . Hyperlipidemia Father   . Diabetes Paternal Grandmother   . Hypertension Paternal Grandmother   . Hyperlipidemia Paternal Grandmother   . Mental illness Sister   . Hypertension Paternal Uncle   . Diabetes Paternal Aunt     Social History   Socioeconomic History  . Marital status: Married    Spouse name: Not on file  . Number of children: Not on file  . Years of education: 69  . Highest education level: Not on file  Occupational History  . Occupation: RN/OR    Employer: [employer]  . Financial resource strain: Not on file  . Food insecurity    Worry: Not on file    Inability: Not on file  . Transportation needs    Medical: Not on file    Non-medical: Not on file  Tobacco Use  . Smoking status: Never Smoker  . Smokeless tobacco: Never Used  Substance and Sexual Activity  . Alcohol use: No    Alcohol/week: 0.0 standard drinks  . Drug use: No  . Sexual activity: Yes    Partners: Male    Birth control/protection: Surgical    Comment: Husband's vasectomy  Lifestyle  . Physical activity    Days per week: Not on file    Minutes per session: Not on file  .  Stress: Not on file  Relationships  . Social Herbalist on phone: Not on file    Gets together: Not on file    Attends religious service: Not on file    Active member of club or organization: Not on file    Attends meetings of clubs or organizations: Not on file    Relationship status: Not on file  . Intimate partner violence    Fear of current or ex partner: Not on file  Emotionally abused: Not on file    Physically abused: Not on file    Forced sexual activity: Not on file  Other Topics Concern  . Not on file  Social History Narrative  . Not on file    Past Medical History, Surgical history, Social history, and Family history were reviewed and updated as appropriate.   Please see review of systems for further details on the patient's review from today.   Objective:   Physical Exam:  There were no vitals taken for this visit.  Physical Exam Constitutional:      General: She is not in acute distress.    Appearance: Normal appearance. She is well-developed.  Musculoskeletal:        General: No deformity.  Neurological:     Mental Status: She is alert and oriented to person, place, and time.     Motor: No tremor.     Coordination: Coordination normal.     Gait: Gait normal.  Psychiatric:        Attention and Perception: She is attentive.        Mood and Affect: Mood is anxious. Mood is not depressed. Affect is not labile, blunt, angry or inappropriate.        Speech: Speech normal.        Behavior: Behavior normal.        Thought Content: Thought content normal. Thought content does not include homicidal or suicidal ideation. Thought content does not include homicidal or suicidal plan.        Cognition and Memory: Cognition normal.        Judgment: Judgment normal.     Comments: Insight intact. No auditory or visual hallucinations. No delusions.      Lab Review:     Component Value Date/Time   NA 141 11/24/2018 1503   K 4.5 11/24/2018 1503   CL 107  11/24/2018 1503   CO2 27 11/24/2018 1503   GLUCOSE 85 11/24/2018 1503   BUN 19 11/24/2018 1503   CREATININE 0.80 11/24/2018 1503   CALCIUM 9.5 11/24/2018 1503   PROT 6.3 11/24/2018 1503   ALBUMIN 4.2 04/28/2018 1241   AST 18 11/24/2018 1503   ALT 18 11/24/2018 1503   ALKPHOS 70 04/28/2018 1241   BILITOT 0.4 11/24/2018 1503   GFRNONAA >60 04/28/2018 1241   GFRAA >60 04/28/2018 1241       Component Value Date/Time   WBC 6.5 11/24/2018 1503   RBC 4.87 11/24/2018 1503   HGB 15.2 11/24/2018 1503   HCT 43.4 11/24/2018 1503   PLT 231 11/24/2018 1503   MCV 89.1 11/24/2018 1503   MCV 91.8 08/03/2015 1159   MCH 31.2 11/24/2018 1503   MCHC 35.0 11/24/2018 1503   RDW 13.0 11/24/2018 1503   LYMPHSABS 2,217 11/24/2018 1503   MONOABS 0.5 02/07/2018 1509   EOSABS 163 11/24/2018 1503   BASOSABS 33 11/24/2018 1503    No results found for: POCLITH, LITHIUM   No results found for: PHENYTOIN, PHENOBARB, VALPROATE, CBMZ   .res Assessment: Plan:    Generalized anxiety disorder  Panic disorder with agoraphobia  Insomnia due to mental condition  Major depression, recurrent, full remission ([place])   Overall med combo has worked well for depression and anxiety but is more anxiious lately..  Disc options for benefit more: increase Abilify, topiramate off label, prn propranolol. Disc SE in detail of each option.   Prefer to avoid increasing Xanax.  mAKES HER sleepy in the daytime.  [place]le[place] well at  the beach last [place].  Discussed potential metabolic side effects associated with atypical antipsychotics, as well as potential risk for movement side effects. Advised pt to contact office if movement side effects occur.   We discussed the short-term risks associated with benzodiazepines including sedation and increased fall risk among others.  Discussed long-term side effect risk including dependence, potential withdrawal symptoms, and the potential eventual dose-related risk of dementia. And disc risk  of Ambien amnesia.  Consider switch to Ativan from Xanax to reduce the sedation from Bz.    Better self care and more sleep would help.  GET MORE SLEEP.  cONTINUE   topiramate bc no weight gain risk to 200mg  daily.  Disc SE.  Rx prn propranolol 20-40 mg BID for anxiety.   This appt was 30 mins.  FU 6 mos  Lynder Parents, MD, DFAPA     Please see After Visit Summary for patient specific instructions.  No future appointments.  No orders of the defined types were placed in this encounter.     -------------------------------

## 2019-02-05 ENCOUNTER — Other Ambulatory Visit (HOSPITAL_COMMUNITY): Payer: Self-pay | Admitting: Orthopedic Surgery

## 2019-02-05 ENCOUNTER — Other Ambulatory Visit: Payer: Self-pay | Admitting: Orthopedic Surgery

## 2019-02-05 DIAGNOSIS — M25551 Pain in right hip: Secondary | ICD-10-CM

## 2019-02-09 ENCOUNTER — Ambulatory Visit (HOSPITAL_COMMUNITY)
Admission: RE | Admit: 2019-02-09 | Discharge: 2019-02-09 | Disposition: A | Discharge status: Home / Self Care | Payer: No Typology Code available for payment source | Source: Ambulatory Visit | Attending: Orthopedic Surgery | Admitting: Orthopedic Surgery

## 2019-02-09 ENCOUNTER — Other Ambulatory Visit: Payer: Self-pay

## 2019-02-09 DIAGNOSIS — M25551 Pain in right hip: Secondary | ICD-10-CM | POA: Insufficient documentation

## 2019-03-05 ENCOUNTER — Other Ambulatory Visit: Payer: Self-pay

## 2019-03-05 ENCOUNTER — Ambulatory Visit (INDEPENDENT_AMBULATORY_CARE_PROVIDER_SITE_OTHER): Payer: No Typology Code available for payment source | Admitting: Family

## 2019-03-05 ENCOUNTER — Ambulatory Visit: Payer: No Typology Code available for payment source | Admitting: Family Medicine

## 2019-03-05 DIAGNOSIS — J069 Acute upper respiratory infection, unspecified: Secondary | ICD-10-CM | POA: Diagnosis not present

## 2019-03-05 NOTE — Progress Notes (Signed)
Virtual Visit via Video Note  I connected with Brooke [name] on 03/05/19 at  9:40 AM EST by a video enabled telemedicine application and verified that I am speaking with the correct person using two identifiers.  Location: Patient: home Provider: work   I discussed the limitations of evaluation and management by telemedicine and the availability of in person appointments. The patient expressed understanding and agreed to proceed.  History of Present Illness:  Patient is a 48 year old female who presents today with complaint of sinus congestion, nasal drainage, scratchy throat and occasional cough.  Denies associated fever.  Reports symptoms started a few days ago. Denies body aches, loss of taste or smell, or headache.  She denies known contact to person affected with COVID-19.  She is employed by [employer] and her employer has requested that she undergo Covid testing today.  They have already placed the order through their health at work.  She denies history of seasonal allergies.  Past Medical History:  Diagnosis Date  . Anxiety   . Depression   . Fatty liver    seen on [place] 04-2018  . Hyperglycemia 03/05/2013  . Incontinence   . Lumbar stress fracture   . Osteoporosis      Social History   Socioeconomic History  . Marital status: Married    Spouse name: Not on file  . Number of children: Not on file  . Years of education: 79  . Highest education level: Not on file  Occupational History  . Occupation: RN/OR    Employer: [employer]  . Financial resource strain: Not on file  . Food insecurity    Worry: Not on file    Inability: Not on file  . Transportation needs    Medical: Not on file    Non-medical: Not on file  Tobacco Use  . Smoking status: Never Smoker  . Smokeless tobacco: Never Used  Substance and Sexual Activity  . Alcohol use: No    Alcohol/week: 0.0 standard drinks  . Drug use: No  . Sexual activity: Yes    Partners: Male    Birth control/protection: Surgical    Comment: Husband's vasectomy  Lifestyle  . Physical activity    Days per week: Not on file    Minutes per session: Not on file  . Stress: Not on file  Relationships  . Social Herbalist on phone: Not on file    Gets together: Not on file    Attends religious service: Not on file    Active member of club or organization: Not on file    Attends meetings of clubs or organizations: Not on file    Relationship status: Not on file  . Intimate partner violence    Fear of current or ex partner: Not on file    Emotionally abused: Not on file    Physically abused: Not on file    Forced sexual activity: Not on file  Other Topics Concern  . Not on file  Social History Narrative  . Not on file    Past Surgical History:  Procedure Laterality Date  . RHINOPLASTY    . SINUS ENDO W/FUSION    . TONSILLECTOMY      Family History  Problem Relation Age of Onset  . Thyroid disease Mother   . Hyperlipidemia Mother   . Diabetes Father   . Hypertension Father   . Hyperlipidemia Father   . Diabetes Paternal Grandmother   .  Hypertension Paternal Grandmother   . Hyperlipidemia Paternal Grandmother   . Mental illness Sister   . Hypertension Paternal Uncle   . Diabetes Paternal Aunt     No Known Allergies  Current Outpatient Medications on File Prior to Visit  Medication Sig Dispense Refill  . alendronate (FOSAMAX) 70 MG tablet Take 1 tablet (70 mg total) by mouth every 7 (seven) days. Take with a full glass of water on an empty stomach. 12 tablet 3  . ALPRAZolam (XANAX XR) 0.5 MG 24 hr tablet 1 in am if needed for anxiety and 2 at night for anxiety and sleep 270 tablet 0  . ARIPiprazole (ABILIFY) 5 MG tablet Take 1 tablet (5 mg total) by mouth daily. 90 tablet 0  . calcium citrate-vitamin D (CITRACAL+D) 315-200 MG-UNIT per tablet Take 2 tablets by mouth daily.    . cefUROXime (CEFTIN) 500 MG tablet Take 1 tablet (500 mg total) by mouth 2  (two) times daily with a meal. 14 tablet 0  . cholecalciferol (VITAMIN D) 1000 UNITS tablet Take 1,000 Units by mouth daily.    . Cranberry 1000 MG CAPS Take 1 capsule by mouth every evening.     . dicyclomine (BENTYL) 20 MG tablet Take 1 tablet (20 mg total) by mouth 3 (three) times daily as needed for spasms (abdominal cramping). 20 tablet 0  . HYDROcodone-homatropine (HYCODAN) 5-1.5 MG/5ML syrup Take 5 mLs by mouth at bedtime as needed for cough. 120 mL 0  . ibuprofen (ADVIL,MOTRIN) 200 MG tablet Take 400 mg by mouth every 6 (six) hours as needed for moderate pain.    Marland Kitchen ketoconazole (NIZORAL) 2 % cream Apply to feet daily for 6 weeks. 60 g 3  . Liraglutide -Weight Management (SAXENDA) 18 MG/3ML SOPN Inject 3 mg into the skin daily. 30 pen 1  . ondansetron (ZOFRAN ODT) 4 MG disintegrating tablet Take 1 tablet (4 mg total) by mouth every 8 (eight) hours as needed for nausea or vomiting. 20 tablet 0  . PARoxetine (PAXIL) 40 MG tablet TAKE 2 TABLETS (80 MG TOTAL) BY MOUTH AT BEDTIME. 180 tablet 0  . propranolol (INDERAL) 20 MG tablet Take 1-2 tablets (20-40 mg total) by mouth 2 (two) times daily as needed. 120 tablet 1  . QUEtiapine (SEROQUEL) 25 MG tablet Take 1 tablet (25 mg total) by mouth at bedtime as needed. (Patient not taking: Reported on 01/29/2019) 90 tablet 0  . terbinafine (LAMISIL) 250 MG tablet Take 1 tablet (250 mg total) by mouth daily. 30 tablet 2  . topiramate (TOPAMAX) 50 MG tablet TAKE 2 TABLETS (100 MG TOTAL) BY MOUTH 2 (TWO) TIMES DAILY. 360 tablet 1  . traZODone (DESYREL) 50 MG tablet Take 2 tablets (100 mg total) by mouth at bedtime. 60 tablet 1  . zolpidem (AMBIEN) 10 MG tablet Take 1 tablet (10 mg total) by mouth at bedtime as needed for sleep. 30 tablet 1   No current facility-administered medications on file prior to visit.     There were no vitals taken for this visit.    Observations/Objective:   Gen: Awake, alert, no acute distress Resp: Breathing is even and  non-labored Psych: calm/pleasant demeanor Neuro: Alert and Oriented x 3, + facial symmetry, speech is clear.   Assessment and Plan:  Viral URI-symptoms are most consistent with a viral upper respiratory infection at this point.  I did advise her however to self quarantine pending results of COVID-19 testing.  We discussed supportive measures and patient is advised to  contact us if her symptoms worsen or if they fail to improve.  Patient verbalizes understanding.  Follow Up Instructions:    I discussed the assessment and treatment plan with the patient. The patient was provided an opportunity to ask questions and all were answered. The patient agreed with the plan and demonstrated an understanding of the instructions.   The patient was advised to call back or seek an in-person evaluation if the symptoms worsen or if the condition fails to improve as anticipated.  Nance Pear, NP

## 2019-03-06 ENCOUNTER — Other Ambulatory Visit: Payer: Self-pay | Admitting: Psychiatry

## 2019-03-06 MED FILL — PARoxetine HCL 40 MG TABS: 40 | 90 days supply | Qty: 180 | Fill #0

## 2019-03-22 ENCOUNTER — Other Ambulatory Visit: Payer: Self-pay | Admitting: Psychiatry

## 2019-03-22 MED FILL — ARIPIPRAZOLE 5 MG TABS: 5 | 90 days supply | Qty: 90 | Fill #0

## 2019-03-22 MED FILL — TOPIRAMATE 50 MG TABLET: 50 | 90 days supply | Qty: 360 | Fill #1

## 2019-04-10 ENCOUNTER — Encounter (HOSPITAL_COMMUNITY)
Admission: RE | Admit: 2019-04-10 | Discharge: 2019-04-10 | Disposition: A | Discharge status: Home / Self Care | Payer: No Typology Code available for payment source | Source: Ambulatory Visit | Attending: Orthopedic Surgery | Admitting: Orthopedic Surgery

## 2019-04-10 ENCOUNTER — Other Ambulatory Visit (HOSPITAL_COMMUNITY)
Admission: RE | Admit: 2019-04-10 | Discharge: 2019-04-10 | Disposition: A | Discharge status: Home / Self Care | Payer: No Typology Code available for payment source | Source: Ambulatory Visit

## 2019-04-10 ENCOUNTER — Encounter (HOSPITAL_COMMUNITY): Payer: Self-pay

## 2019-04-10 ENCOUNTER — Other Ambulatory Visit: Payer: Self-pay

## 2019-04-10 DIAGNOSIS — Z20828 Contact with and (suspected) exposure to other viral communicable diseases: Secondary | ICD-10-CM | POA: Insufficient documentation

## 2019-04-10 DIAGNOSIS — Z01818 Encounter for other preprocedural examination: Secondary | ICD-10-CM | POA: Insufficient documentation

## 2019-04-10 LAB — CBC WITH DIFFERENTIAL/PLATELET
Abs Immature Granulocytes: 0 10*3/uL (ref 0.00–0.07)
Basophils Absolute: 0 10*3/uL (ref 0.0–0.1)
Basophils Relative: 0 %
Eosinophils Absolute: 0.1 10*3/uL (ref 0.0–0.5)
Eosinophils Relative: 3 %
HCT: 44.9 % (ref 36.0–46.0)
Hemoglobin: 14.3 g/dL (ref 12.0–15.0)
Immature Granulocytes: 0 %
Lymphocytes Relative: 33 %
Lymphs Abs: 1.5 10*3/uL (ref 0.7–4.0)
MCH: 30.6 pg (ref 26.0–34.0)
MCHC: 31.8 g/dL (ref 30.0–36.0)
MCV: 95.9 fL (ref 80.0–100.0)
Monocytes Absolute: 0.4 10*3/uL (ref 0.1–1.0)
Monocytes Relative: 8 %
Neutro Abs: 2.6 10*3/uL (ref 1.7–7.7)
Neutrophils Relative %: 56 %
Platelets: 241 10*3/uL (ref 150–400)
RBC: 4.68 MIL/uL (ref 3.87–5.11)
RDW: 12.4 % (ref 11.5–15.5)
WBC: 4.6 10*3/uL (ref 4.0–10.5)
nRBC: 0 % (ref 0.0–0.2)

## 2019-04-10 LAB — COMPREHENSIVE METABOLIC PANEL
ALT: 20 U/L (ref 0–44)
AST: 18 U/L (ref 15–41)
Albumin: 4.1 g/dL (ref 3.5–5.0)
Alkaline Phosphatase: 89 U/L (ref 38–126)
Anion gap: 7 (ref 5–15)
BUN: 19 mg/dL (ref 6–20)
CO2: 24 mmol/L (ref 22–32)
Calcium: 9.2 mg/dL (ref 8.9–10.3)
Chloride: 113 mmol/L — ABNORMAL HIGH (ref 98–111)
Creatinine, Ser: 0.87 mg/dL (ref 0.44–1.00)
GFR calc Af Amer: >60 mL/min (ref >60)
GFR calc non Af Amer: >60 mL/min (ref >60)
Glucose, Bld: 90 mg/dL (ref 70–99)
Potassium: 4.3 mmol/L (ref 3.5–5.1)
Sodium: 144 mmol/L (ref 135–145)
Total Bilirubin: 0.7 mg/dL (ref 0.3–1.2)
Total Protein: 6.4 g/dL — ABNORMAL LOW (ref 6.5–8.1)

## 2019-04-10 LAB — PROTIME-INR
INR: 0.9 (ref 0.8–1.2)
Prothrombin Time: 11.8 seconds (ref 11.4–15.2)

## 2019-04-10 LAB — TYPE AND SCREEN
ABO/RH(D): O POS
Antibody Screen: NEGATIVE

## 2019-04-10 LAB — ABO/RH: ABO/RH(D): O POS

## 2019-04-10 LAB — SARS CORONAVIRUS 2 (TAT 6-24 HRS): SARS Coronavirus 2: NEGATIVE

## 2019-04-10 LAB — APTT: aPTT: 30 seconds (ref 24–36)

## 2019-04-10 MED ORDER — BUPIVACAINE LIPOSOME 1.3 % IJ SUSP
20.0000 mL | Freq: Once | INTRAMUSCULAR | Status: AC
  Filled 2019-04-10: qty 20

## 2019-04-10 NOTE — Anesthesia Preprocedure Evaluation (Addendum)
Anesthesia Evaluation  Patient identified by MRN, date of birth, ID band Patient awake    Reviewed: Allergy & Precautions, NPO status , Patient's Chart, lab work & pertinent test results  Airway Mallampati: II  TM Distance: >3 FB Neck ROM: Full    Dental no notable dental hx. (+) Teeth Intact, Dental Advisory Given   Pulmonary neg pulmonary ROS,    Pulmonary exam normal breath sounds clear to auscultation       Cardiovascular Exercise Tolerance: Good Normal cardiovascular exam Rhythm:Regular Rate:Normal     Neuro/Psych PSYCHIATRIC DISORDERS Anxiety negative neurological ROS     GI/Hepatic Neg liver ROS,   Endo/Other  negative endocrine ROS  Renal/GU K+ 4.3 Cr 0.87     Musculoskeletal negative musculoskeletal ROS (+)   Abdominal (+) + obese,   Peds  Hematology hgb 14.3 plt 281   Anesthesia Other Findings   Reproductive/Obstetrics                            Anesthesia Physical Anesthesia Plan  ASA: II  Anesthesia Plan: General   Post-op Pain Management:    Induction: Intravenous  PONV Risk Score and Plan: 3 and Treatment may vary due to age or medical condition, Dexamethasone and Ondansetron  Airway Management Planned: LMA  Additional Equipment: None  Intra-op Plan:   Post-operative Plan:   Informed Consent: I have reviewed the patients History and Physical, chart, labs and discussed the procedure including the risks, benefits and alternatives for the proposed anesthesia with the patient or authorized representative who has indicated his/her understanding and acceptance.     Dental advisory given  Plan Discussed with: CRNA  Anesthesia Plan Comments:        Anesthesia Quick Evaluation

## 2019-04-10 NOTE — Patient Instructions (Addendum)
DUE TO COVID-19 ONLY ONE VISITOR IS ALLOWED TO COME WITH YOU AND STAY IN THE WAITING ROOM ONLY DURING PRE OP AND PROCEDURE DAY OF SURGERY. THE 1 VISITOR MAY VISIT WITH YOU AFTER SURGERY IN YOUR PRIVATE ROOM DURING VISITING HOURS ONLY!  YOU NEED TO HAVE A COVID 19 TEST ON 04-10-19, PLEASE BEGIN THE QUARANTINE INSTRUCTIONS AS OUTLINED IN YOUR HANDOUT.                Brooke [name]  04/10/2019   Your procedure is scheduled on: 04-11-19    Report to [facility] Main  Entrance    Report to Admitting at 6:30 AM     Call this number if you have problems the morning of surgery [phone]    Remember: NO SOLID FOOD AFTER MIDNIGHT THE NIGHT PRIOR TO SURGERY. NOTHING BY MOUTH EXCEPT CLEAR LIQUIDS UNTIL 5:30 AM . PLEASE FINISH ENSURE DRINK PER SURGEON ORDER  WHICH NEEDS TO BE COMPLETED AT 5:30 AM .   CLEAR LIQUID DIET   Foods Allowed                                                                     Foods Excluded  Coffee and tea, regular and decaf                             liquids that you cannot  Plain Jell-O any favor except red or purple                                           see through such as: Fruit ices (not with fruit pulp)                                     milk, soups, orange juice  Iced Popsicles                                    All solid food Carbonated beverages, regular and diet                                    Cranberry, grape and apple juices Sports drinks like Gatorade Lightly seasoned clear broth or consume(fat free) Sugar, honey syrup   _____________________________________________________________________       Take these medicines the morning of surgery with A SIP OF WATER: Alprazolam (Xanax) prn), and Propranolol (Inderal), prn   BRUSH YOUR TEETH MORNING OF SURGERY AND RINSE YOUR MOUTH OUT, NO CHEWING GUM CANDY OR MINTS.                                 You may not have any metal on your body including hair pins and               piercings  Do not wear jewelry, make-up, lotions, powders or perfumes, deodorant              Do not wear nail polish on your fingernails.  Do not shave  48 hours prior to surgery.            Do not bring valuables to the hospital. [place].  Contacts, dentures or bridgework may not be worn into surgery.      Special Instructions: N/A              Please read over the following fact sheets you were given: _____________________________________________________________________  [facility] - Preparing for Surgery Before surgery, you can play an important role.  Because skin is not sterile, your skin needs to be as free of germs as possible.  You can reduce the number of germs on your skin by washing with CHG (chlorahexidine gluconate) soap before surgery.  CHG is an antiseptic cleaner which kills germs and bonds with the skin to continue killing germs even after washing. Please DO NOT use if you have an allergy to CHG or antibacterial soaps.  If your skin becomes reddened/irritated stop using the CHG and inform your nurse when you arrive at Short Stay. Do not shave (including legs and underarms) for at least 48 hours prior to the first CHG shower.  You may shave your face/neck. Please follow these instructions carefully:  1.  Shower with CHG Soap the night before surgery and the  morning of Surgery.  2.  If you choose to wash your hair, wash your hair first as usual with your  normal  shampoo.  3.  After you shampoo, rinse your hair and body thoroughly to remove the  shampoo.                           4.  Use CHG as you would any other liquid soap.  You can apply chg directly  to the skin and wash                       Gently with a scrungie or clean washcloth.  5.  Apply the CHG Soap to your body ONLY FROM THE NECK DOWN.   Do not use on face/ open                           Wound or open sores. Avoid contact with eyes, ears mouth and  genitals (private parts).                       Wash face,  Genitals (private parts) with your normal soap.             6.  Wash thoroughly, paying special attention to the area where your surgery  will be performed.  7.  Thoroughly rinse your body with warm water from the neck down.  8.  DO NOT shower/wash with your normal soap after using and rinsing off  the CHG Soap.                9.  Pat yourself dry with a clean towel.            10.  Wear clean pajamas.  11.  Place clean sheets on your bed the night of your first shower and do not  sleep with pets. Day of Surgery : Do not apply any lotions/deodorants the morning of surgery.  Please wear clean clothes to the hospital/surgery center.  FAILURE TO FOLLOW THESE INSTRUCTIONS MAY RESULT IN THE CANCELLATION OF YOUR SURGERY PATIENT SIGNATURE_________________________________  NURSE SIGNATURE__________________________________  ________________________________________________________________________   Adam Phenix  An incentive spirometer is a tool that can help keep your lungs clear and active. This tool measures how well you are filling your lungs with each breath. Taking long deep breaths may help reverse or decrease the chance of developing breathing (pulmonary) problems (especially infection) following:  A long period of time when you are unable to move or be active. BEFORE THE PROCEDURE   If the spirometer includes an indicator to show your best effort, your nurse or respiratory therapist will set it to a desired goal.  If possible, sit up straight or lean slightly forward. Try not to slouch.  Hold the incentive spirometer in an upright position. INSTRUCTIONS FOR USE  1. Sit on the edge of your bed if possible, or sit up as far as you can in bed or on a chair. 2. Hold the incentive spirometer in an upright position. 3. Breathe out normally. 4. Place the mouthpiece in your mouth and seal your lips tightly around  it. 5. Breathe in slowly and as deeply as possible, raising the piston or the ball toward the top of the column. 6. Hold your breath for 3-5 seconds or for as long as possible. Allow the piston or ball to fall to the bottom of the column. 7. Remove the mouthpiece from your mouth and breathe out normally. 8. Rest for a few seconds and repeat Steps 1 through 7 at least 10 times every 1-2 hours when you are awake. Take your time and take a few normal breaths between deep breaths. 9. The spirometer may include an indicator to show your best effort. Use the indicator as a goal to work toward during each repetition. 10. After each set of 10 deep breaths, practice coughing to be sure your lungs are clear. If you have an incision (the cut made at the time of surgery), support your incision when coughing by placing a pillow or rolled up towels firmly against it. Once you are able to get out of bed, walk around indoors and cough well. You may stop using the incentive spirometer when instructed by your caregiver.  RISKS AND COMPLICATIONS  Take your time so you do not get dizzy or light-headed.  If you are in pain, you may need to take or ask for pain medication before doing incentive spirometry. It is harder to take a deep breath if you are having pain. AFTER USE  Rest and breathe slowly and easily.  It can be helpful to keep track of a log of your progress. Your caregiver can provide you with a simple table to help with this. If you are using the spirometer at home, follow these instructions: [place] IF:   You are having difficultly using the spirometer.  You have trouble using the spirometer as often as instructed.  Your pain medication is not giving enough relief while using the spirometer.  You develop fever of 100.5 F (38.1 C) or higher. SEEK IMMEDIATE MEDICAL CARE IF:   You cough up bloody sputum that had not been present before.  You develop fever of 102 F (38.9 C) or  greater.  You develop worsening pain at or near the incision site. MAKE SURE YOU:   Understand these instructions.  Will watch your condition.  Will get help right away if you are not doing well or get worse. Document Released: 08/30/2006 Document Revised: 07/12/2011 Document Reviewed: 10/31/2006 [facility] Patient Information 2014 [place], [place].   ________________________________________________________________________

## 2019-04-11 ENCOUNTER — Encounter (HOSPITAL_COMMUNITY)
Admission: RE | Disposition: A | Discharge status: Home / Self Care | Payer: Self-pay | Source: Home / Self Care | Attending: Orthopedic Surgery

## 2019-04-11 ENCOUNTER — Observation Stay (HOSPITAL_COMMUNITY)
Admission: RE | Admit: 2019-04-11 | Discharge: 2019-04-12 | Disposition: A | Discharge status: Home / Self Care | Payer: No Typology Code available for payment source | Attending: Orthopedic Surgery | Admitting: Orthopedic Surgery

## 2019-04-11 ENCOUNTER — Ambulatory Visit (HOSPITAL_COMMUNITY): Payer: No Typology Code available for payment source | Admitting: Anesthesiology

## 2019-04-11 ENCOUNTER — Ambulatory Visit (HOSPITAL_COMMUNITY): Payer: No Typology Code available for payment source | Admitting: Physician Assistant

## 2019-04-11 ENCOUNTER — Encounter (HOSPITAL_COMMUNITY): Payer: Self-pay | Admitting: *Deleted

## 2019-04-11 DIAGNOSIS — Z79899 Other long term (current) drug therapy: Secondary | ICD-10-CM | POA: Insufficient documentation

## 2019-04-11 DIAGNOSIS — F419 Anxiety disorder, unspecified: Secondary | ICD-10-CM | POA: Diagnosis not present

## 2019-04-11 DIAGNOSIS — M7631 Iliotibial band syndrome, right leg: Secondary | ICD-10-CM | POA: Insufficient documentation

## 2019-04-11 DIAGNOSIS — M7061 Trochanteric bursitis, right hip: Principal | ICD-10-CM | POA: Insufficient documentation

## 2019-04-11 DIAGNOSIS — F329 Major depressive disorder, single episode, unspecified: Secondary | ICD-10-CM | POA: Insufficient documentation

## 2019-04-11 DIAGNOSIS — M25551 Pain in right hip: Secondary | ICD-10-CM | POA: Diagnosis present

## 2019-04-11 HISTORY — PX: EXCISION/RELEASE BURSA HIP: SHX5014

## 2019-04-11 LAB — PREGNANCY, URINE: Preg Test, Ur: NEGATIVE

## 2019-04-11 SURGERY — RELEASE, BURSA, TROCHANTERIC
Anesthesia: General | Site: Hip | Laterality: Right

## 2019-04-11 MED ORDER — PHENYLEPHRINE 40 MCG/ML (10ML) SYRINGE FOR IV PUSH (FOR BLOOD PRESSURE SUPPORT)
PREFILLED_SYRINGE | INTRAVENOUS | Status: AC
  Filled 2019-04-11: qty 10

## 2019-04-11 MED ORDER — ONDANSETRON HCL 4 MG/2ML IJ SOLN: 4.0000 mg | Freq: Once | INTRAMUSCULAR | Status: DC | PRN

## 2019-04-11 MED ORDER — ALUM & MAG HYDROXIDE-SIMETH 200-200-20 MG/5ML PO SUSP: 30.0000 mL | ORAL | Status: DC | PRN

## 2019-04-11 MED ORDER — METOCLOPRAMIDE HCL 5 MG/ML IJ SOLN: 5.0000 mg | Freq: Three times a day (TID) | INTRAMUSCULAR | Status: DC | PRN

## 2019-04-11 MED ORDER — METHOCARBAMOL 500 MG IVPB - SIMPLE MED
500.0000 mg | Freq: Four times a day (QID) | INTRAVENOUS | Status: DC | PRN
  Filled 2019-04-11: qty 50

## 2019-04-11 MED ORDER — CHLORHEXIDINE GLUCONATE 4 % EX LIQD: 60.0000 mL | Freq: Once | CUTANEOUS | Status: DC

## 2019-04-11 MED ORDER — DEXMEDETOMIDINE HCL IN NACL 200 MCG/50ML IV SOLN
INTRAVENOUS | Status: DC | PRN
  Administered 2019-04-11 (×2): 8 ug via INTRAVENOUS
  Administered 2019-04-11: 4 ug via INTRAVENOUS

## 2019-04-11 MED ORDER — CEFAZOLIN SODIUM-DEXTROSE 2-4 GM/100ML-% IV SOLN
2.0000 g | INTRAVENOUS | Status: AC
  Administered 2019-04-11: 2 g via INTRAVENOUS
  Filled 2019-04-11: qty 100

## 2019-04-11 MED ORDER — ASPIRIN EC 325 MG PO TBEC
325.0000 mg | DELAYED_RELEASE_TABLET | Freq: Every day | ORAL | Status: DC
  Administered 2019-04-12: 325 mg via ORAL
  Filled 2019-04-11: qty 1

## 2019-04-11 MED ORDER — ZOLPIDEM TARTRATE 5 MG PO TABS: 5.0000 mg | ORAL_TABLET | Freq: Every evening | ORAL | Status: DC | PRN

## 2019-04-11 MED ORDER — GABAPENTIN 300 MG PO CAPS
300.0000 mg | ORAL_CAPSULE | Freq: Once | ORAL | Status: AC
  Administered 2019-04-11: 07:00:00 300 mg via ORAL
  Filled 2019-04-11: qty 1

## 2019-04-11 MED ORDER — ALPRAZOLAM ER 0.5 MG PO TB24: 0.5000 mg | ORAL_TABLET | Freq: Every day | ORAL | Status: DC | PRN

## 2019-04-11 MED ORDER — ACETAMINOPHEN 500 MG PO TABS
1000.0000 mg | ORAL_TABLET | Freq: Four times a day (QID) | ORAL | Status: DC
  Administered 2019-04-11 – 2019-04-12 (×3): 1000 mg via ORAL
  Filled 2019-04-11 (×4): qty 2

## 2019-04-11 MED ORDER — FENTANYL CITRATE (PF) 100 MCG/2ML IJ SOLN
INTRAMUSCULAR | Status: DC | PRN
  Administered 2019-04-11: 50 ug via INTRAVENOUS
  Administered 2019-04-11: 25 ug via INTRAVENOUS

## 2019-04-11 MED ORDER — TOPIRAMATE 100 MG PO TABS
200.0000 mg | ORAL_TABLET | Freq: Every day | ORAL | Status: DC
  Administered 2019-04-11: 200 mg via ORAL
  Filled 2019-04-11: qty 2

## 2019-04-11 MED ORDER — ROCURONIUM BROMIDE 10 MG/ML (PF) SYRINGE
PREFILLED_SYRINGE | INTRAVENOUS | Status: AC
  Filled 2019-04-11: qty 10

## 2019-04-11 MED ORDER — CEFAZOLIN SODIUM-DEXTROSE 1-4 GM/50ML-% IV SOLN
1.0000 g | Freq: Four times a day (QID) | INTRAVENOUS | Status: AC
  Administered 2019-04-11 (×2): 1 g via INTRAVENOUS
  Filled 2019-04-11 (×2): qty 50

## 2019-04-11 MED ORDER — PHENOL 1.4 % MT LIQD: 1.0000 | OROMUCOSAL | Status: DC | PRN

## 2019-04-11 MED ORDER — OXYCODONE HCL 5 MG PO TABS: 10.0000 mg | ORAL_TABLET | ORAL | Status: DC | PRN

## 2019-04-11 MED ORDER — DEXAMETHASONE SODIUM PHOSPHATE 10 MG/ML IJ SOLN
INTRAMUSCULAR | Status: DC | PRN
  Administered 2019-04-11: 8 mg via INTRAVENOUS

## 2019-04-11 MED ORDER — SODIUM CHLORIDE (PF) 0.9 % IJ SOLN
INTRAMUSCULAR | Status: AC
  Filled 2019-04-11: qty 20

## 2019-04-11 MED ORDER — FLEET ENEMA 7-19 GM/118ML RE ENEM: 1.0000 | ENEMA | Freq: Once | RECTAL | Status: DC | PRN

## 2019-04-11 MED ORDER — ONDANSETRON HCL 4 MG/2ML IJ SOLN
INTRAMUSCULAR | Status: DC | PRN
  Administered 2019-04-11: 4 mg via INTRAVENOUS

## 2019-04-11 MED ORDER — ACETAMINOPHEN 325 MG PO TABS: 325.0000 mg | ORAL_TABLET | Freq: Four times a day (QID) | ORAL | Status: DC | PRN

## 2019-04-11 MED ORDER — LACTATED RINGERS IV SOLN
INTRAVENOUS | Status: DC
  Administered 2019-04-11: 07:00:00 via INTRAVENOUS

## 2019-04-11 MED ORDER — MIDAZOLAM HCL 5 MG/5ML IJ SOLN
INTRAMUSCULAR | Status: DC | PRN
  Administered 2019-04-11: 2 mg via INTRAVENOUS

## 2019-04-11 MED ORDER — POLYETHYLENE GLYCOL 3350 17 G PO PACK: 17.0000 g | PACK | Freq: Every day | ORAL | Status: DC | PRN

## 2019-04-11 MED ORDER — HYDROMORPHONE HCL 1 MG/ML IJ SOLN: 0.5000 mg | INTRAMUSCULAR | Status: DC | PRN

## 2019-04-11 MED ORDER — OXYCODONE HCL 5 MG PO TABS
5.0000 mg | ORAL_TABLET | ORAL | Status: DC | PRN
  Administered 2019-04-11: 5 mg via ORAL
  Administered 2019-04-11 – 2019-04-12 (×3): 10 mg via ORAL
  Filled 2019-04-11 (×2): qty 2
  Filled 2019-04-11: qty 1
  Filled 2019-04-11: qty 2

## 2019-04-11 MED ORDER — ONDANSETRON HCL 4 MG/2ML IJ SOLN
INTRAMUSCULAR | Status: AC
  Filled 2019-04-11: qty 2

## 2019-04-11 MED ORDER — METHOCARBAMOL 500 MG PO TABS
500.0000 mg | ORAL_TABLET | Freq: Four times a day (QID) | ORAL | Status: DC | PRN
  Administered 2019-04-11 (×2): 500 mg via ORAL
  Filled 2019-04-11 (×2): qty 1

## 2019-04-11 MED ORDER — MIDAZOLAM HCL 2 MG/2ML IJ SOLN
INTRAMUSCULAR | Status: AC
  Filled 2019-04-11: qty 2

## 2019-04-11 MED ORDER — DEXAMETHASONE SODIUM PHOSPHATE 10 MG/ML IJ SOLN
INTRAMUSCULAR | Status: AC
  Filled 2019-04-11: qty 1

## 2019-04-11 MED ORDER — KETOROLAC TROMETHAMINE 30 MG/ML IJ SOLN: 30.0000 mg | Freq: Once | INTRAMUSCULAR | Status: DC | PRN

## 2019-04-11 MED ORDER — LIDOCAINE 2% (20 MG/ML) 5 ML SYRINGE
INTRAMUSCULAR | Status: AC
  Filled 2019-04-11: qty 5

## 2019-04-11 MED ORDER — PROPOFOL 10 MG/ML IV BOLUS
INTRAVENOUS | Status: AC
  Filled 2019-04-11: qty 20

## 2019-04-11 MED ORDER — OXYCODONE HCL 5 MG PO TABS: 5.0000 mg | ORAL_TABLET | Freq: Once | ORAL | Status: DC | PRN

## 2019-04-11 MED ORDER — ACETAMINOPHEN 500 MG PO TABS
1000.0000 mg | ORAL_TABLET | Freq: Once | ORAL | Status: AC
  Administered 2019-04-11: 1000 mg via ORAL
  Filled 2019-04-11: qty 2

## 2019-04-11 MED ORDER — OXYCODONE HCL 5 MG/5ML PO SOLN: 5.0000 mg | Freq: Once | ORAL | Status: DC | PRN

## 2019-04-11 MED ORDER — METOCLOPRAMIDE HCL 5 MG PO TABS: 5.0000 mg | ORAL_TABLET | Freq: Three times a day (TID) | ORAL | Status: DC | PRN

## 2019-04-11 MED ORDER — PROPOFOL 10 MG/ML IV BOLUS
INTRAVENOUS | Status: DC | PRN
  Administered 2019-04-11: 200 mg via INTRAVENOUS

## 2019-04-11 MED ORDER — EPHEDRINE SULFATE-NACL 50-0.9 MG/10ML-% IV SOSY
PREFILLED_SYRINGE | INTRAVENOUS | Status: DC | PRN
  Administered 2019-04-11 (×3): 5 mg via INTRAVENOUS

## 2019-04-11 MED ORDER — DOCUSATE SODIUM 100 MG PO CAPS
100.0000 mg | ORAL_CAPSULE | Freq: Two times a day (BID) | ORAL | Status: DC
  Administered 2019-04-11 – 2019-04-12 (×2): 100 mg via ORAL
  Filled 2019-04-11 (×2): qty 1

## 2019-04-11 MED ORDER — ALPRAZOLAM ER 1 MG PO TB24
1.0000 mg | ORAL_TABLET | Freq: Every day | ORAL | Status: DC
  Administered 2019-04-11: 1 mg via ORAL
  Filled 2019-04-11: qty 1

## 2019-04-11 MED ORDER — ONDANSETRON HCL 4 MG/2ML IJ SOLN: 4.0000 mg | Freq: Four times a day (QID) | INTRAMUSCULAR | Status: DC | PRN

## 2019-04-11 MED ORDER — MENTHOL 3 MG MT LOZG: 1.0000 | LOZENGE | OROMUCOSAL | Status: DC | PRN

## 2019-04-11 MED ORDER — ARIPIPRAZOLE 5 MG PO TABS
5.0000 mg | ORAL_TABLET | Freq: Every day | ORAL | Status: DC
  Administered 2019-04-11 – 2019-04-12 (×2): 5 mg via ORAL
  Filled 2019-04-11 (×2): qty 1

## 2019-04-11 MED ORDER — LACTATED RINGERS IV SOLN
INTRAVENOUS | Status: DC
  Administered 2019-04-11: 12:00:00 via INTRAVENOUS

## 2019-04-11 MED ORDER — EPHEDRINE 5 MG/ML INJ
INTRAVENOUS | Status: AC
  Filled 2019-04-11: qty 10

## 2019-04-11 MED ORDER — SODIUM CHLORIDE (PF) 0.9 % IJ SOLN
INTRAMUSCULAR | Status: DC | PRN
  Administered 2019-04-11: 20 mL

## 2019-04-11 MED ORDER — PAROXETINE HCL 20 MG PO TABS
80.0000 mg | ORAL_TABLET | Freq: Every day | ORAL | Status: DC
  Filled 2019-04-11: qty 4

## 2019-04-11 MED ORDER — TRAZODONE HCL 50 MG PO TABS: 50.0000 mg | ORAL_TABLET | Freq: Every evening | ORAL | Status: DC | PRN

## 2019-04-11 MED ORDER — HYDROMORPHONE HCL 1 MG/ML IJ SOLN: 0.2500 mg | INTRAMUSCULAR | Status: DC | PRN

## 2019-04-11 MED ORDER — CELECOXIB 200 MG PO CAPS
200.0000 mg | ORAL_CAPSULE | Freq: Once | ORAL | Status: AC
  Administered 2019-04-11: 200 mg via ORAL
  Filled 2019-04-11: qty 1

## 2019-04-11 MED ORDER — QUETIAPINE FUMARATE 25 MG PO TABS: 25.0000 mg | ORAL_TABLET | Freq: Two times a day (BID) | ORAL | Status: DC

## 2019-04-11 MED ORDER — DEXMEDETOMIDINE HCL IN NACL 200 MCG/50ML IV SOLN
INTRAVENOUS | Status: AC
  Filled 2019-04-11: qty 50

## 2019-04-11 MED ORDER — LIDOCAINE 2% (20 MG/ML) 5 ML SYRINGE
INTRAMUSCULAR | Status: DC | PRN
  Administered 2019-04-11: 80 mg via INTRAVENOUS

## 2019-04-11 MED ORDER — BISACODYL 5 MG PO TBEC: 5.0000 mg | DELAYED_RELEASE_TABLET | Freq: Every day | ORAL | Status: DC | PRN

## 2019-04-11 MED ORDER — PROPRANOLOL HCL 20 MG PO TABS
20.0000 mg | ORAL_TABLET | Freq: Two times a day (BID) | ORAL | Status: DC | PRN
  Filled 2019-04-11: qty 2

## 2019-04-11 MED ORDER — 0.9 % SODIUM CHLORIDE (POUR BTL) OPTIME
TOPICAL | Status: DC | PRN
  Administered 2019-04-11: 1000 mL

## 2019-04-11 MED ORDER — PHENYLEPHRINE 40 MCG/ML (10ML) SYRINGE FOR IV PUSH (FOR BLOOD PRESSURE SUPPORT)
PREFILLED_SYRINGE | INTRAVENOUS | Status: DC | PRN
  Administered 2019-04-11 (×2): 80 ug via INTRAVENOUS

## 2019-04-11 MED ORDER — FENTANYL CITRATE (PF) 100 MCG/2ML IJ SOLN
INTRAMUSCULAR | Status: AC
  Filled 2019-04-11: qty 2

## 2019-04-11 MED ORDER — BUPIVACAINE LIPOSOME 1.3 % IJ SUSP
INTRAMUSCULAR | Status: DC | PRN
  Administered 2019-04-11: 20 mL

## 2019-04-11 MED ORDER — LACTATED RINGERS IV SOLN: INTRAVENOUS | Status: DC

## 2019-04-11 MED ORDER — ONDANSETRON HCL 4 MG PO TABS: 4.0000 mg | ORAL_TABLET | Freq: Four times a day (QID) | ORAL | Status: DC | PRN

## 2019-04-11 SURGICAL SUPPLY — 51 items
ADH SKN CLS APL DERMABOND .7 (GAUZE/BANDAGES/DRESSINGS)
BAG SPEC THK2 15X12 ZIP CLS (MISCELLANEOUS) ×1
BAG ZIPLOCK 12X15 (MISCELLANEOUS) ×2 IMPLANT
BIT DRILL 2.4X128 (BIT) IMPLANT
BLADE EXTENDED COATED 6.5IN (ELECTRODE) ×2 IMPLANT
COVER SURGICAL LIGHT HANDLE (MISCELLANEOUS) ×2 IMPLANT
COVER WAND RF STERILE (DRAPES) ×1 IMPLANT
DERMABOND ADVANCED (GAUZE/BANDAGES/DRESSINGS)
DERMABOND ADVANCED .7 DNX12 (GAUZE/BANDAGES/DRESSINGS) IMPLANT
DRAPE INCISE IOBAN 66X45 STRL (DRAPES) ×2 IMPLANT
DRAPE ORTHO SPLIT 77X108 STRL (DRAPES) ×4
DRAPE SURG ORHT 6 SPLT 77X108 (DRAPES) ×2 IMPLANT
DRAPE U-SHAPE 47X51 STRL (DRAPES) ×2 IMPLANT
DRSG AQUACEL AG ADV 3.5X10 (GAUZE/BANDAGES/DRESSINGS) ×1 IMPLANT
DRSG EMULSION OIL 3X16 NADH (GAUZE/BANDAGES/DRESSINGS) IMPLANT
DRSG PAD ABDOMINAL 8X10 ST (GAUZE/BANDAGES/DRESSINGS) IMPLANT
DURAPREP 26ML APPLICATOR (WOUND CARE) ×2 IMPLANT
ELECT BLADE TIP CTD 4 INCH (ELECTRODE) ×1 IMPLANT
ELECT REM PT RETURN 15FT ADLT (MISCELLANEOUS) ×2 IMPLANT
GAUZE SPONGE 4X4 12PLY STRL (GAUZE/BANDAGES/DRESSINGS) IMPLANT
GLOVE BIOGEL PI IND STRL 6.5 (GLOVE) ×1 IMPLANT
GLOVE BIOGEL PI IND STRL 8.5 (GLOVE) ×1 IMPLANT
GLOVE BIOGEL PI INDICATOR 6.5 (GLOVE) ×1
GLOVE BIOGEL PI INDICATOR 8.5 (GLOVE) ×1
GLOVE ECLIPSE 8.0 STRL XLNG CF (GLOVE) ×2 IMPLANT
GLOVE SURG SS PI 6.5 STRL IVOR (GLOVE) ×2 IMPLANT
GOWN STRL REUS W/TWL LRG LVL3 (GOWN DISPOSABLE) ×2 IMPLANT
GOWN STRL REUS W/TWL XL LVL3 (GOWN DISPOSABLE) ×2 IMPLANT
KIT BASIN OR (CUSTOM PROCEDURE TRAY) ×2 IMPLANT
KIT TURNOVER KIT A (KITS) IMPLANT
MANIFOLD NEPTUNE II (INSTRUMENTS) ×2 IMPLANT
NDL SAFETY ECLIPSE 18X1.5 (NEEDLE) ×1 IMPLANT
NEEDLE HYPO 18GX1.5 SHARP (NEEDLE) ×4
NEEDLE HYPO 22GX1.5 SAFETY (NEEDLE) ×1 IMPLANT
NS IRRIG 1000ML POUR BTL (IV SOLUTION) ×2 IMPLANT
PACK TOTAL JOINT (CUSTOM PROCEDURE TRAY) ×2 IMPLANT
PASSER SUT SWANSON 36MM LOOP (INSTRUMENTS) IMPLANT
PENCIL SMOKE EVACUATOR (MISCELLANEOUS) ×1 IMPLANT
PROTECTOR NERVE ULNAR (MISCELLANEOUS) ×2 IMPLANT
STAPLER VISISTAT (STAPLE) IMPLANT
STRIP CLOSURE SKIN 1/2X4 (GAUZE/BANDAGES/DRESSINGS) ×2 IMPLANT
SUT ETHIBOND NAB CT1 #1 30IN (SUTURE) IMPLANT
SUT MNCRL AB 4-0 PS2 18 (SUTURE) ×2 IMPLANT
SUT VIC AB 0 CT1 36 (SUTURE) IMPLANT
SUT VIC AB 1 CT1 27 (SUTURE) ×4
SUT VIC AB 1 CT1 27XBRD ANTBC (SUTURE) ×2 IMPLANT
SUT VIC AB 2-0 CT1 27 (SUTURE) ×6
SUT VIC AB 2-0 CT1 TAPERPNT 27 (SUTURE) ×2 IMPLANT
SYR 20ML LL LF (SYRINGE) ×3 IMPLANT
SYR 30ML LL (SYRINGE) ×1 IMPLANT
TOWEL OR 17X26 10 PK STRL BLUE (TOWEL DISPOSABLE) ×2 IMPLANT

## 2019-04-11 NOTE — Brief Op Note (Signed)
04/11/2019  9:19 AM  PATIENT:  Brooke [name]  48 y.o. female  PRE-OPERATIVE DIAGNOSIS:  right hip Greater Trochanteric  bursitis   POST-OPERATIVE DIAGNOSIS:  Same as Pre-Op  PROCEDURE:  Procedure(s) with comments: Right hip bursectomy (Right) - 99min and excision of portion of IT Band  SURGEON:  Surgeon(s) and Role:    [name], MD - Primary  PHYSICIAN ASSISTANT:AmberConstable PA  ASSISTANTS: Ardeen Jourdain PA}   ANESTHESIA:   general  EBL: 20cc  BLOOD ADMINISTERED:none  DRAINS: none   LOCAL MEDICATIONS USED:  OTHER 20cc of Exparel mixed with 20cc of Normal Saline at the end of the Procedure.  SPECIMEN:  No Specimen  DISPOSITION OF SPECIMEN:  None  COUNTS:  YES  TOURNIQUET:  * No tourniquets in log *  DICTATION: .Other Dictation: Dictation Number [id number]  PLAN OF CARE: Admit for overnight observation  PATIENT DISPOSITION:  Stable in OR   Delay start of Pharmacological VTE agent (>24hrs) due to surgical blood loss or risk of bleeding: yes

## 2019-04-11 NOTE — Evaluation (Signed)
Physical Therapy One Time Evaluation Patient Details Name: Brooke [name] MRN: [id number] DOB: January 31, 1971 Today's Date: 04/11/2019   History of Present Illness  Pt is a 48 year old female s/p Right hip bursectomy  Clinical Impression  Patient is s/p above surgery resulting in functional limitations due to the deficits listed below (see PT Problem List).  Patient will benefit from skilled PT to increase their independence and safety with mobility to allow discharge to the venue listed below.  Pt educated on no active hip abduction and PWB status.  Pt ambulated in hallway and practiced safe stair technique. Pt has performed mobility tasks for d/c and agreeable to no further PT needs at this time.  Pt and spouse educated on precautions and use of RW.  Pt will need RW for d/c home. No further acute PT needs identified at this time.     Follow Up Recommendations Follow surgeon's recommendation for DC plan and follow-up therapies    Equipment Recommendations  Rolling walker with 5" wheels    Recommendations for Other Services       Precautions / Restrictions Precautions Precautions: Fall Precaution Comments: no active hip abduction Restrictions Weight Bearing Restrictions: Yes RLE Weight Bearing: Partial weight bearing RLE Partial Weight Bearing Percentage or Pounds: "100"      Mobility  Bed Mobility Overal bed mobility: Needs Assistance Bed Mobility: Supine to Sit     Supine to sit: Supervision     General bed mobility comments: cues for no active abduction, supervision for safety  Transfers Overall transfer level: Needs assistance Equipment used: Rolling walker (2 wheeled) Transfers: Sit to/from Stand Sit to Stand: Supervision         General transfer comment: close supervision for safety, cues for hand placement  Ambulation/Gait Ambulation/Gait assistance: Supervision;Min guard Gait Distance (Feet): 250 Feet Assistive device: Rolling walker (2 wheeled) Gait  Pattern/deviations: Step-through pattern;Decreased stance time - right     General Gait Details: verbal cues for use of RW, steady with RW  Stairs Stairs: Yes Stairs assistance: Min guard Stair Management: Step to pattern;Forwards;One rail Left Number of Stairs: 3 General stair comments: verbal cues for sequence and safety; spouse present and observed  Wheelchair Mobility    Modified Rankin (Stroke Patients Only)       Balance                                             Pertinent Vitals/Pain Pain Assessment: No/denies pain    Home Living Family/patient expects to be discharged to:: Private residence Living Arrangements: Spouse/significant other   Type of Home: House       Home Layout: Multi-level Home Equipment: None      Prior Function Level of Independence: Independent         Comments: 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        Extremity/Trunk Assessment        Lower Extremity Assessment Lower Extremity Assessment: RLE deficits/detail RLE Deficits / Details: grossly 3/5 throughout with observation, aware of no active hip abduction       Communication   Communication: No difficulties  Cognition Arousal/Alertness: Awake/alert Behavior During Therapy: WFL for tasks assessed/performed Overall Cognitive Status: Within Functional Limits for tasks assessed  General Comments      Exercises     Assessment/Plan    PT Assessment Patent does not need any further PT services  PT Problem List         PT Treatment Interventions      PT Goals (Current goals can be found in the Care Plan section)  Acute Rehab PT Goals PT Goal Formulation: All assessment and education complete, DC therapy    Frequency     Barriers to discharge        Co-evaluation               AM-PAC PT "6 Clicks" Mobility  Outcome Measure Help needed turning from your  back to your side while in a flat bed without using bedrails?: None Help needed moving from lying on your back to sitting on the side of a flat bed without using bedrails?: A Little Help needed moving to and from a bed to a chair (including a wheelchair)?: A Little Help needed standing up from a chair using your arms (e.g., wheelchair or bedside chair)?: A Little Help needed to walk in hospital room?: A Little Help needed climbing 3-5 steps with a railing? : A Little 6 Click Score: 19    End of Session Equipment Utilized During Treatment: Gait belt Activity Tolerance: Patient tolerated treatment well Patient left: in chair;with call bell/phone within reach;with family/visitor present(pt and spouse aware to call for assist out of recliner) Nurse Communication: Mobility status PT Visit Diagnosis: Difficulty in walking, not elsewhere classified (R26.2)    Time: [id number] PT Time Calculation (min) (ACUTE ONLY): 22 min   Charges:   PT Evaluation $PT Eval Low Complexity: [place], PT, DPT Acute Rehabilitation Services Office: [phone] Pager: [phone]  Trena Platt 04/11/2019, 3:21 PM

## 2019-04-11 NOTE — Anesthesia Procedure Notes (Signed)
Procedure Name: LMA Insertion Date/Time: 04/11/2019 8:36 AM Performed by: Victoriano Lain, CRNA Pre-anesthesia Checklist: Patient identified, Emergency Drugs available, Suction available, Patient being monitored and Timeout performed Patient Re-evaluated:Patient Re-evaluated prior to induction Oxygen Delivery Method: Circle system utilized Preoxygenation: Pre-oxygenation with 100% oxygen Induction Type: IV induction LMA: LMA with gastric port inserted LMA Size: 4.0 Number of attempts: 1 Placement Confirmation: positive ETCO2 and breath sounds checked- equal and bilateral Tube secured with: Tape Dental Injury: Teeth and Oropharynx as per pre-operative assessment

## 2019-04-11 NOTE — Op Note (Signed)
NAME: CAMMI, KLUVER MEDICAL RECORD [id number] ACCOUNT [id number] DATE OF BIRTH:[birth year] FACILITY: [facility] LOCATION: [facility] PHYSICIAN:Anwita Mencer Fransico Setters, MD  OPERATIVE REPORT  DATE OF PROCEDURE:  04/11/2019  SURGEON:  [name], MD  ASSISTANT:  Ardeen Jourdain, PA  PREOPERATIVE DIAGNOSES:   1.  Iliotibial band syndrome, right hip. 2.  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  POSTOPERATIVE DIAGNOSES:   1.  Iliotibial band syndrome, right hip. 2.  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  OPERATION: 1.  Open elliptical excision of a portion of the IT band. 2.  Bursectomy, right greater trochanteric bursa. 3.  Exploration of the gluteal muscle and tendon.  DESCRIPTION OF PROCEDURE:  Under general anesthesia with the patient on the left side, right side up the appropriate time-out was first carried out.  I also marked the appropriate right hip in the holding area.  She had 2 grams of IV Ancef.  At this  time, after the timeout and after the prep, incision was made over the greater trochanteric region and right hip.  Bleeders identified and cauterized.  The incision was taken down to the iliotibial band.  The self-retaining retractors were advanced.  At  this time, I marked off an elliptical portion of the IT band and I did an elliptical excision of a portion of the band.  I then made split the band medially, 1 anteriorly and 1 posteriorly.  We did remove the greater trochanteric bursa.  I explored the  gluteus tendon and muscle in that region.  There were no tears noted.  I thoroughly irrigated out the area and made sure we had good hemostasis and then closed the wound in layers in the usual fashion after we injected 20 mL of Exparel mixed with 20 mL  of normal saline.  Sterile dressings were applied.  The patient will be kept overnight for pain control.  CN/NUANCE   D:04/11/2019 T:04/11/2019 JOB:[id number]

## 2019-04-11 NOTE — Anesthesia Postprocedure Evaluation (Signed)
Anesthesia Post Note  Patient: Brooke [name]  Procedure(s) Performed: Right hip bursectomy (Right Hip)     Patient location during evaluation: PACU Anesthesia Type: General Level of consciousness: awake and alert Pain management: pain level controlled Vital Signs Assessment: post-procedure vital signs reviewed and stable Respiratory status: spontaneous breathing, nonlabored ventilation, respiratory function stable and patient connected to nasal cannula oxygen Cardiovascular status: blood pressure returned to baseline and stable Postop Assessment: no apparent nausea or vomiting Anesthetic complications: no    Last Vitals:  Vitals:   04/11/19 1030 04/11/19 1045  BP: 92/64 99/69  Pulse: 85 80  Resp: 15 13  Temp:  (!) 36.4 C  SpO2: 92%     Last Pain:  Vitals:   04/11/19 1030  TempSrc:   PainSc: 0-No pain                 Barnet Glasgow

## 2019-04-11 NOTE — Transfer of Care (Signed)
Immediate Anesthesia Transfer of Care Note  Patient: Brooke [name]  Procedure(s) Performed: Right hip bursectomy (Right Hip)  Patient Location: PACU  Anesthesia Type:General  Level of Consciousness: awake, alert , oriented and patient cooperative  Airway & Oxygen Therapy: Patient Spontanous Breathing and Patient connected to face mask oxygen  Post-op Assessment: Report given to RN, Post -op Vital signs reviewed and stable and Patient moving all extremities  Post vital signs: Reviewed and stable  Last Vitals:  Vitals Value Taken Time  BP 94/61 04/11/19 0950  Temp    Pulse 87 04/11/19 0951  Resp 17 04/11/19 0951  SpO2 98 % 04/11/19 0951  Vitals shown include unvalidated device data.  Last Pain:  Vitals:   04/11/19 0650  TempSrc: Oral         Complications: No apparent anesthesia complications

## 2019-04-11 NOTE — H&P (Signed)
Brooke [name] is an 48 y.o. female.   Chief Complaint: Pain in Right Hip HPI: Progressive pain in her Right Hip which has not respondedto Several Trochanteric Injections.  Past Medical History:  Diagnosis Date  . Anxiety   . Depression   . Fatty liver    seen on [place] 04-2018  . Hyperglycemia 03/05/2013  . Incontinence   . Lumbar stress fracture   . Osteoporosis     Past Surgical History:  Procedure Laterality Date  . RHINOPLASTY    . SINUS ENDO W/FUSION    . TONSILLECTOMY      Family History  Problem Relation Age of Onset  . Thyroid disease Mother   . Hyperlipidemia Mother   . Diabetes Father   . Hypertension Father   . Hyperlipidemia Father   . Diabetes Paternal Grandmother   . Hypertension Paternal Grandmother   . Hyperlipidemia Paternal Grandmother   . Mental illness Sister   . Hypertension Paternal Uncle   . Diabetes Paternal Aunt    Social History:  reports that she has never smoked. She has never used smokeless tobacco. She reports that she does not drink alcohol or use drugs.  Allergies: No Known Allergies  Medications Prior to Admission  Medication Sig Dispense Refill  . ALPRAZolam (XANAX XR) 0.5 MG 24 hr tablet 1 in am if needed for anxiety and 2 at night for anxiety and sleep (Patient taking differently: Take 0.5-1 mg by mouth See admin instructions. Take 1 mg at bedtime, may take 0.5 mg dose during the day as needed for anxiety) 270 tablet 0  . ARIPiprazole (ABILIFY) 5 MG tablet Take 1 tablet (5 mg total) by mouth daily. (Patient taking differently: Take 5 mg by mouth at bedtime. ) 90 tablet 1  . ibuprofen (ADVIL,MOTRIN) 200 MG tablet Take 800 mg by mouth every 6 (six) hours as needed for moderate pain.     . Liraglutide -Weight Management (SAXENDA) 18 MG/3ML SOPN Inject 3 mg into the skin daily. 30 pen 1  . PARoxetine (PAXIL) 40 MG tablet TAKE 2 TABLETS (80 MG TOTAL) BY MOUTH AT BEDTIME. 180 tablet 1  . propranolol (INDERAL) 20 MG tablet Take 1-2 tablets  (20-40 mg total) by mouth 2 (two) times daily as needed. (Patient taking differently: Take 20-40 mg by mouth 2 (two) times daily as needed (anxiety). ) 120 tablet 1  . topiramate (TOPAMAX) 50 MG tablet TAKE 2 TABLETS (100 MG TOTAL) BY MOUTH 2 (TWO) TIMES DAILY. (Patient taking differently: Take 200 mg by mouth at bedtime. ) 360 tablet 1  . traZODone (DESYREL) 50 MG tablet Take 2 tablets (100 mg total) by mouth at bedtime. (Patient taking differently: Take 50-100 mg by mouth at bedtime as needed for sleep. ) 60 tablet 1  . zolpidem (AMBIEN) 10 MG tablet Take 1 tablet (10 mg total) by mouth at bedtime as needed for sleep. 30 tablet 1  . alendronate (FOSAMAX) 70 MG tablet Take 1 tablet (70 mg total) by mouth every 7 (seven) days. Take with a full glass of water on an empty stomach. (Patient not taking: Reported on 04/09/2019) 12 tablet 3  . QUEtiapine (SEROQUEL) 25 MG tablet Take 1 tablet (25 mg total) by mouth at bedtime as needed. (Patient not taking: Reported on 01/29/2019) 90 tablet 0  . terbinafine (LAMISIL) 250 MG tablet Take 1 tablet (250 mg total) by mouth daily. (Patient not taking: Reported on 04/09/2019) 30 tablet 2    Results for orders placed or performed  during the hospital encounter of 04/11/19 (from the past 48 hour(s))  Pregnancy, urine     Status: None   Collection Time: 04/11/19  6:41 AM  Result Value Ref Range   Preg Test, Ur NEGATIVE NEGATIVE    Comment:        THE SENSITIVITY OF THIS METHODOLOGY IS >20 mIU/mL. Performed at [facility], [place] [address], [place], [place] [address]    No results found.  Review of Systems  Constitutional: Negative.   HENT: Negative.   Eyes: Negative.   Respiratory: Negative.   Cardiovascular: Negative.   Gastrointestinal: Negative.   Genitourinary: Negative.   Musculoskeletal: Positive for joint pain.  Skin: Negative.   Neurological: Negative.   Endo/Heme/Allergies: Negative.   Psychiatric/Behavioral: Negative.      Blood pressure 104/69, pulse 90, temperature 98.4 F (36.9 C), temperature source Oral, resp. rate 18, SpO2 100 %. Physical Exam  Constitutional: She appears well-developed.  HENT:  Head: Normocephalic.  Eyes: Pupils are equal, round, and reactive to light.  Neck: Normal range of motion.  Cardiovascular: Normal rate.  Respiratory: Effort normal.  GI: Soft.  Musculoskeletal:        General: Tenderness present.  Neurological: She is alert.  Skin: Skin is warm.     Assessment/Plan Right Greater Trochanteric Bursectomy and release of the IT Band and repair of Gluteus Medius Tear.  [name], MD 04/11/2019, 8:03 AM

## 2019-04-11 NOTE — Interval H&P Note (Signed)
History and Physical Interval Note:  04/11/2019 8:07 AM  Brooke [name]  has presented today for surgery, with the diagnosis of right hip bursitis.  The various methods of treatment have been discussed with the patient and family. After consideration of risks, benefits and other options for treatment, the patient has consented to  Procedure(s) with comments: Right hip bursectomy (Right) - 56min as a surgical intervention.  The patient's history has been reviewed, patient examined, no change in status, stable for surgery.  I have reviewed the patient's chart and labs.  Questions were answered to the patient's satisfaction.     [name]

## 2019-04-12 DIAGNOSIS — M7061 Trochanteric bursitis, right hip: Secondary | ICD-10-CM | POA: Diagnosis not present

## 2019-04-12 MED ORDER — OXYCODONE HCL 5 MG PO TABS: 5.0000 mg | ORAL_TABLET | Freq: Four times a day (QID) | ORAL | 0 refills | Status: DC | PRN

## 2019-04-12 MED ORDER — METHOCARBAMOL 500 MG PO TABS: 500.0000 mg | ORAL_TABLET | Freq: Four times a day (QID) | ORAL | 0 refills | Status: DC | PRN

## 2019-04-12 MED ORDER — ASPIRIN 325 MG PO TBEC: 325.0000 mg | DELAYED_RELEASE_TABLET | Freq: Two times a day (BID) | ORAL | 0 refills | Status: DC

## 2019-04-12 MED FILL — METHOCARBAMOL 500 MG TABLET: 500 | 10 days supply | Qty: 40 | Fill #0

## 2019-04-12 MED FILL — oxyCODONE HCL 5 MG TABS: 5 | 5 days supply | Qty: 40 | Fill #0

## 2019-04-12 MED FILL — ASPIRIN EC 325 MG TABLET: 325 | 20 days supply | Qty: 40 | Fill #0

## 2019-04-12 NOTE — Progress Notes (Signed)
Subjective: 1 Day Post-Op Procedure(s) (LRB): Right hip bursectomy (Right) Patient reports pain as 1 on 0-10 scale.Doing well today.    Objective: Vital signs in last 24 hours: Temp:  [97.5 F (36.4 C)-98.1 F (36.7 C)] 97.9 F (36.6 C) (12/10 0428) Pulse Rate:  [71-99] 71 (12/10 0428) Resp:  [13-19] 16 (12/10 0428) BP: (80-117)/(53-75) 113/75 (12/10 0428) SpO2:  [92 %-100 %] 99 % (12/10 0428) Weight:  [81.7 kg] 81.7 kg (12/09 1234)  Intake/Output from previous day: 12/09 0701 - 12/10 0700 In: 2932.2 [P.O.:720; I.V.:2062.2; IV Piggyback:150] Out: [id number] [Urine:1600; Blood:75] Intake/Output this shift: No intake/output data recorded.  Recent Labs    04/10/19 0930  HGB 14.3   Recent Labs    04/10/19 0930  WBC 4.6  RBC 4.68  HCT 44.9  PLT 241   Recent Labs    04/10/19 0930  NA 144  K 4.3  CL 113*  CO2 24  BUN 19  CREATININE 0.87  GLUCOSE 90  CALCIUM 9.2   Recent Labs    04/10/19 0930  INR 0.9    Neurologically intact No cellulitis present   Assessment/Plan: 1 Day Post-Op Procedure(s) (LRB): Right hip bursectomy (Right) Up with therapy.Will DC and see in the office in one and a half weeks.      Aisley Whan L Cheril Slattery 04/12/2019, 7:17 AM

## 2019-04-12 NOTE — TOC Transition Note (Addendum)
Transition of Care [facility]) - CM/SW Discharge Note   Patient Details  Name: Brooke [name] MRN: [id number] Date of Birth: [birth year]  [facility]) CM/SW Contact:  Lia Hopping, LCSW Phone Number: 04/12/2019, 10:11 AM   Clinical Narrative:    Therapy Plan: HEP [employer] unable to supply a RW. RW ordered through [facility] and will be delivered to the patient room prior to discharge.     Final next level of care: Home/Self Care Barriers to Discharge: No Barriers Identified   Patient Goals and CMS Choice     Choice offered to / list presented to : NA  Discharge Placement                       Discharge Plan and Services                DME Arranged: Walker rolling DME Agency: [employer] Date DME Agency Contacted: 04/12/19 Time DME Agency Contacted: [phone] Representative spoke with at DME Agency: [place] ([place]) Interventions     Readmission Risk Interventions No flowsheet data found.

## 2019-04-13 NOTE — Discharge Summary (Signed)
Physician Discharge Summary   Patient ID: Brooke [name] MRN: [id number] DOB/AGE: 1970/05/26 48 y.o.  Admit date: 04/11/2019 Discharge date: 04/13/2019  Primary Diagnosis:  Right hip bursitis  Admission Diagnoses:  Past Medical History:  Diagnosis Date  . Anxiety   . Depression   . Fatty liver    seen on [place] 04-2018  . Hyperglycemia 03/05/2013  . Incontinence   . Lumbar stress fracture   . Osteoporosis    Discharge Diagnoses:   Active Problems:   Greater trochanteric bursitis of right hip  Procedure:  Procedure(s) (LRB): Right hip bursectomy (Right)   Consults: None  HPI: The patient is a 48 year old female who presented to the office with right lateral hip pain. She was diagnosed with right hip bursitis. She did not improve with conservative treatments including HEP and cortisone injections. MRI showed bursitis but no gluteal tendon tear.     Laboratory Data: Hospital Outpatient Visit on 04/10/2019  Component Date Value Ref Range Status  . aPTT 04/10/2019 30  24 - 36 seconds Final   Performed at [facility], [place] [address], [place], [place] [address]  . WBC 04/10/2019 4.6  4.0 - 10.5 K/uL Final  . RBC 04/10/2019 4.68  3.87 - 5.11 MIL/uL Final  . Hemoglobin 04/10/2019 14.3  12.0 - 15.0 g/dL Final  . HCT 04/10/2019 44.9  36.0 - 46.0 % Final  . MCV 04/10/2019 95.9  80.0 - 100.0 fL Final  . MCH 04/10/2019 30.6  26.0 - 34.0 pg Final  . MCHC 04/10/2019 31.8  30.0 - 36.0 g/dL Final  . RDW 04/10/2019 12.4  11.5 - 15.5 % Final  . Platelets 04/10/2019 241  150 - 400 K/uL Final  . nRBC 04/10/2019 0.0  0.0 - 0.2 % Final  . Neutrophils Relative % 04/10/2019 56  % Final  . Neutro Abs 04/10/2019 2.6  1.7 - 7.7 K/uL Final  . Lymphocytes Relative 04/10/2019 33  % Final  . Lymphs Abs 04/10/2019 1.5  0.7 - 4.0 K/uL Final  . Monocytes Relative 04/10/2019 8  % Final  . Monocytes Absolute 04/10/2019 0.4  0.1 - 1.0 K/uL Final  . Eosinophils Relative 04/10/2019  3  % Final  . Eosinophils Absolute 04/10/2019 0.1  0.0 - 0.5 K/uL Final  . Basophils Relative 04/10/2019 0  % Final  . Basophils Absolute 04/10/2019 0.0  0.0 - 0.1 K/uL Final  . Immature Granulocytes 04/10/2019 0  % Final  . Abs Immature Granulocytes 04/10/2019 0.00  0.00 - 0.07 K/uL Final   Performed at [facility], [place] [address], [place], [place] [address]  . Sodium 04/10/2019 144  135 - 145 mmol/L Final  . Potassium 04/10/2019 4.3  3.5 - 5.1 mmol/L Final  . Chloride 04/10/2019 113* 98 - 111 mmol/L Final  . CO2 04/10/2019 24  22 - 32 mmol/L Final  . Glucose, Bld 04/10/2019 90  70 - 99 mg/dL Final  . BUN 04/10/2019 19  6 - 20 mg/dL Final  . Creatinine, Ser 04/10/2019 0.87  0.44 - 1.00 mg/dL Final  . Calcium 04/10/2019 9.2  8.9 - 10.3 mg/dL Final  . Total Protein 04/10/2019 6.4* 6.5 - 8.1 g/dL Final  . Albumin 04/10/2019 4.1  3.5 - 5.0 g/dL Final  . AST 04/10/2019 18  15 - 41 U/L Final  . ALT 04/10/2019 20  0 - 44 U/L Final  . Alkaline Phosphatase 04/10/2019 89  38 - 126 U/L Final  . Total Bilirubin  04/10/2019 0.7  0.3 - 1.2 mg/dL Final  . GFR calc non Af Amer 04/10/2019 >60  >60 mL/min Final  . GFR calc Af Amer 04/10/2019 >60  >60 mL/min Final  . Anion gap 04/10/2019 7  5 - 15 Final   Performed at [facility], [place] [address], [place], [place] [address]  . Prothrombin Time 04/10/2019 11.8  11.4 - 15.2 seconds Final  . INR 04/10/2019 0.9  0.8 - 1.2 Final   Comment: (NOTE) INR goal varies based on device and disease states. Performed at [facility], [place] [address], [place], [place] [address]   . ABO/RH(D) 04/10/2019 O POS   Final  . Antibody Screen 04/10/2019 NEG   Final  . Sample Expiration 04/10/2019 04/24/2019,2359   Final  . Extend sample reason 04/10/2019    Final                   Value:NO TRANSFUSIONS OR PREGNANCY IN THE PAST 3 MONTHS Performed at [place] [address] [place],  [place] [address]   . ABO/RH(D) 04/10/2019    Final                   Value:O POS Performed at [facility], [place] [address] [place], [place] [address]    Recent Labs    04/10/19 0930  HGB 14.3   Recent Labs    04/10/19 0930  WBC 4.6  RBC 4.68  HCT 44.9  PLT 241   Recent Labs    04/10/19 0930  NA 144  K 4.3  CL 113*  CO2 24  BUN 19  CREATININE 0.87  GLUCOSE 90  CALCIUM 9.2   Recent Labs    04/10/19 0930  INR 0.9     Hospital Course: Patient was admitted to [facility] and taken to the OR and underwent the above state procedure without complications.  Patient tolerated the procedure well and was later transferred to the recovery room and then to the orthopaedic floor for postoperative care.  They were given PO and IV analgesics for pain control following their surgery.  They were given 24 hours of postoperative antibiotics.   PT was consulted postop to assist with mobility and transfers.  The patient was allowed to be WBAT with therapy. Discharge planning was consulted to help with postop disposition and equipment needs.  Patient had a good night on the evening of surgery and started to get up OOB with therapy on day one. Patient was seen in rounds and was ready to go home on day one.  They were given discharge instructions and dressing directions.  They were instructed on when to follow up in the office with Dr. Gladstone Lighter.   Diet: Cardiac diet Activity:WBAT Follow-up:in 1.5 weeks Disposition - Home Discharged Condition: stable   Discharge Instructions    Call MD / Call 911   Complete by: As directed    If you experience chest pain or shortness of breath, CALL 911 and be transported to the hospital emergency room.  If you develope a fever above 101 F, pus (white drainage) or increased drainage or redness at the wound, or calf pain, call your surgeon's office.   Constipation Prevention   Complete by: As directed    Drink plenty of fluids.  Prune  juice may be helpful.  You may use a stool softener, such as Colace (over the counter) 100 mg twice a day.  Use MiraLax (  over the counter) for constipation as needed.   Diet - low sodium heart healthy   Complete by: As directed    Increase activity slowly as tolerated   Complete by: As directed      Allergies as of 04/12/2019   No Known Allergies     Medication List    STOP taking these medications   alendronate 70 MG tablet Commonly known as: FOSAMAX   ibuprofen 200 MG tablet Commonly known as: ADVIL   QUEtiapine 25 MG tablet Commonly known as: SEROQUEL   terbinafine 250 MG tablet Commonly known as: LamISIL     TAKE these medications   ALPRAZolam 0.5 MG 24 hr tablet Commonly known as: XANAX XR 1 in am if needed for anxiety and 2 at night for anxiety and sleep What changed:   how much to take  how to take this  when to take this  additional instructions   ARIPiprazole 5 MG tablet Commonly known as: ABILIFY Take 1 tablet (5 mg total) by mouth daily. What changed: when to take this   aspirin 325 MG EC tablet Take 1 tablet (325 mg total) by mouth 2 (two) times daily.   methocarbamol 500 MG tablet Commonly known as: ROBAXIN Take 1 tablet (500 mg total) by mouth every 6 (six) hours as needed for muscle spasms.   oxyCODONE 5 MG immediate release tablet Commonly known as: Oxy IR/ROXICODONE Take 1-2 tablets (5-10 mg total) by mouth every 6 (six) hours as needed for moderate pain (pain score 4-6).   PARoxetine 40 MG tablet Commonly known as: PAXIL TAKE 2 TABLETS (80 MG TOTAL) BY MOUTH AT BEDTIME.   propranolol 20 MG tablet Commonly known as: INDERAL Take 1-2 tablets (20-40 mg total) by mouth 2 (two) times daily as needed. What changed: reasons to take this   Saxenda 18 MG/3ML Sopn Generic drug: Liraglutide -Weight Management Inject 3 mg into the skin daily.   topiramate 50 MG tablet Commonly known as: TOPAMAX TAKE 2 TABLETS (100 MG TOTAL) BY MOUTH 2  (TWO) TIMES DAILY. What changed:   how much to take  when to take this   traZODone 50 MG tablet Commonly known as: DESYREL Take 2 tablets (100 mg total) by mouth at bedtime. What changed:   how much to take  when to take this  reasons to take this   zolpidem 10 MG tablet Commonly known as: AMBIEN Take 1 tablet (10 mg total) by mouth at bedtime as needed for sleep.      Follow-up Information    [name], MD. Schedule an appointment as soon as possible for a visit on 04/23/2019.   Specialty: Orthopedic Surgery Contact information: [address] [place] [address] [phone]           Signed: Ardeen Jourdain, PA-C Orthopaedic Surgery 04/13/2019, 8:21 AM

## 2019-04-23 ENCOUNTER — Other Ambulatory Visit: Payer: Self-pay | Admitting: Psychiatry

## 2019-04-23 NOTE — Telephone Encounter (Signed)
Okay to call in. 

## 2019-04-25 MED FILL — ZOLPIDEM TARTRATE 10 MG TAB: 10 | 30 days supply | Qty: 30 | Fill #0

## 2019-06-13 ENCOUNTER — Telehealth: Payer: Self-pay

## 2019-06-13 MED FILL — PARoxetine HCL 40 MG TABS: 40 | 90 days supply | Qty: 180 | Fill #1

## 2019-06-13 MED FILL — ZOLPIDEM TARTRATE 10 MG TAB: 10 | 30 days supply | Qty: 30 | Fill #1

## 2019-06-13 NOTE — Telephone Encounter (Signed)
PA initiated via Covermymeds; KEY: [id number]. Awaiting determination.

## 2019-06-19 NOTE — Telephone Encounter (Signed)
PA approved.   The request has been approved. The authorization is effective for a maximum of 4 fills from 06/19/2019 to 10/16/2019, as long as the member is enrolled in their current health plan. The request was reviewed and approved by a licensed clinical pharmacist. This request is approved for 20mL per 30 days. Renewal for Saxenda requires the patient has lost at least 4% of baseline body weight after 4 months of treatment. A written notification letter will follow with additional details.

## 2019-06-21 MED FILL — ARIPIPRAZOLE 5 MG TABS: 5 | 90 days supply | Qty: 90 | Fill #1

## 2019-06-26 ENCOUNTER — Other Ambulatory Visit: Payer: Self-pay | Admitting: Psychiatry

## 2019-06-26 MED FILL — TOPIRAMATE 50 MG TABLET: 50 | 90 days supply | Qty: 360 | Fill #0

## 2019-07-25 ENCOUNTER — Ambulatory Visit: Payer: No Typology Code available for payment source | Admitting: Psychiatry

## 2019-08-23 ENCOUNTER — Encounter: Payer: Self-pay | Admitting: Psychiatry

## 2019-08-23 ENCOUNTER — Other Ambulatory Visit: Payer: Self-pay

## 2019-08-23 ENCOUNTER — Ambulatory Visit (INDEPENDENT_AMBULATORY_CARE_PROVIDER_SITE_OTHER): Payer: No Typology Code available for payment source | Admitting: Psychiatry

## 2019-08-23 DIAGNOSIS — F3342 Major depressive disorder, recurrent, in full remission: Secondary | ICD-10-CM

## 2019-08-23 DIAGNOSIS — F411 Generalized anxiety disorder: Secondary | ICD-10-CM

## 2019-08-23 DIAGNOSIS — F4001 Agoraphobia with panic disorder: Secondary | ICD-10-CM

## 2019-08-23 DIAGNOSIS — F5105 Insomnia due to other mental disorder: Secondary | ICD-10-CM

## 2019-08-23 DIAGNOSIS — G4726 Circadian rhythm sleep disorder, shift work type: Secondary | ICD-10-CM

## 2019-08-23 MED ORDER — PAROXETINE HCL 40 MG PO TABS: 80.0000 mg | ORAL_TABLET | Freq: Every day | ORAL | 1 refills | Status: DC

## 2019-08-23 MED ORDER — ARIPIPRAZOLE 5 MG PO TABS: 5.0000 mg | ORAL_TABLET | Freq: Every day | ORAL | 1 refills | Status: DC

## 2019-08-23 MED ORDER — ZOLPIDEM TARTRATE 10 MG PO TABS: 10.0000 mg | ORAL_TABLET | Freq: Every evening | ORAL | 0 refills | Status: DC | PRN

## 2019-08-23 MED ORDER — TRAZODONE HCL 50 MG PO TABS: 100.0000 mg | ORAL_TABLET | Freq: Every day | ORAL | 1 refills | Status: DC

## 2019-08-23 MED ORDER — TOPIRAMATE 50 MG PO TABS: 100.0000 mg | ORAL_TABLET | Freq: Two times a day (BID) | ORAL | 1 refills | Status: DC

## 2019-08-23 MED ORDER — ALPRAZOLAM ER 0.5 MG PO TB24: ORAL_TABLET | ORAL | 0 refills | Status: DC

## 2019-08-23 MED FILL — ZOLPIDEM TARTRATE 10 MG TAB: 10 | 90 days supply | Qty: 90 | Fill #0

## 2019-08-23 MED FILL — traZODone HCL 50 MG TABS: 50 | 90 days supply | Qty: 180 | Fill #0

## 2019-08-23 MED FILL — ALPRAZolam XR 0.5 MG TB24: 0.5 | 90 days supply | Qty: 270 | Fill #0

## 2019-08-23 NOTE — Progress Notes (Addendum)
TRINATY BRESSAN [id number] 1971/04/22 49 y.o.  Subjective:   Patient ID:  Brooke [name] is a 49 y.o. (DOB Sep 17, 1970) female.  Chief Complaint:  Chief Complaint  Patient presents with  . Follow-up  . Anxiety  . Depression  . Sleeping Problem    HPI Brooke [name] presents to the office today for follow-up of anxiety and insomnia  seen December 2019.  For anxiety and weight gain topiramate was increased off label to 200 mg daily.  For anxiety propranolol 20 to 40 mg twice daily was added as needed.    Last seen November 2020: No meds were changed.  The following was noted: OK except for life events.  Still a lot of worry. Managed normally.  H agrees also.  Son's wife pregnant with another man's baby.  Real busy with 60 hours weekly.  Can't take Ambien at times bc of that.  Realizes she needs to take better self care.   Marland Kitchen August 23, 2019 appointment, the following is noted: Still doing well with meds.  Cont intermittent problems with work affecting sleep patterns and which sleep meds she can take.  Satisfied with meds.  No panic.  depression and anxiety manageable not gone.  Panic worse if overworked DT short staffed.  Patient reports stable mood and denies depressed or irritable moods.  Patient reports occasional difficulty with anxiety.  Patient reports occasionally difficulty with sleep initiation or maintenance. Denies appetite disturbance.  Patient reports that energy and motivation have been good.  Patient denies any difficulty with concentration.  Patient denies any suicidal ideation.  Lost 30# on Saxenda and feels good about that and better mood.   Past Psychiatric Medication Trials: Citalopram, fluoxetine, fluvoxamine, and Anafranil, paroxetine 80, duloxetine Buspirone Topiramate Ambien, Ambien CR, trazodone  Review of Systems:  Review of Systems  Musculoskeletal: Positive for arthralgias.  Neurological: Negative for dizziness, tremors and weakness.   Psychiatric/Behavioral: Positive for sleep disturbance. Negative for agitation, behavioral problems, confusion, decreased concentration, dysphoric mood, hallucinations, self-injury and suicidal ideas. The patient is nervous/anxious. The patient is not hyperactive.     Medications: I have reviewed the patient's current medications.  Current Outpatient Medications  Medication Sig Dispense Refill  . aspirin EC 325 MG EC tablet Take 1 tablet (325 mg total) by mouth 2 (two) times daily. 40 tablet 0  . Liraglutide -Weight Management (SAXENDA) 18 MG/3ML SOPN Inject 3 mg into the skin daily. 30 pen 1  . methocarbamol (ROBAXIN) 500 MG tablet Take 1 tablet (500 mg total) by mouth every 6 (six) hours as needed for muscle spasms. 40 tablet 0  . oxyCODONE (OXY IR/ROXICODONE) 5 MG immediate release tablet Take 1-2 tablets (5-10 mg total) by mouth every 6 (six) hours as needed for moderate pain (pain score 4-6). 40 tablet 0  . propranolol (INDERAL) 20 MG tablet Take 1-2 tablets (20-40 mg total) by mouth 2 (two) times daily as needed. (Patient taking differently: Take 20-40 mg by mouth 2 (two) times daily as needed (anxiety). ) 120 tablet 1  . ALPRAZolam (XANAX XR) 0.5 MG 24 hr tablet 1 in am if needed for anxiety and 2 at night for anxiety and sleep 270 tablet 0  . ARIPiprazole (ABILIFY) 5 MG tablet Take 1 tablet (5 mg total) by mouth daily. 90 tablet 1  . PARoxetine (PAXIL) 40 MG tablet Take 2 tablets (80 mg total) by mouth at bedtime. 180 tablet 1  . topiramate (TOPAMAX) 50 MG tablet Take 2 tablets (100 mg total)  by mouth 2 (two) times daily. 360 tablet 1  . traZODone (DESYREL) 50 MG tablet Take 2 tablets (100 mg total) by mouth at bedtime. 180 tablet 1  . zolpidem (AMBIEN) 10 MG tablet Take 1 tablet (10 mg total) by mouth at bedtime as needed for sleep. 90 tablet 0   No current facility-administered medications for this visit.    Medication Side Effects: None  Allergies: No Known Allergies  Past  Medical History:  Diagnosis Date  . Anxiety   . Depression   . Fatty liver    seen on [place] 04-2018  . Hyperglycemia 03/05/2013  . Incontinence   . Lumbar stress fracture   . Osteoporosis     Family History  Problem Relation Age of Onset  . Thyroid disease Mother   . Hyperlipidemia Mother   . Diabetes Father   . Hypertension Father   . Hyperlipidemia Father   . Diabetes Paternal Grandmother   . Hypertension Paternal Grandmother   . Hyperlipidemia Paternal Grandmother   . Mental illness Sister   . Hypertension Paternal Uncle   . Diabetes Paternal Aunt     Social History   Socioeconomic History  . Marital status: Married    Spouse name: Not on file  . Number of children: Not on file  . Years of education: 86  . Highest education level: Not on file  Occupational History  . Occupation: RN/OR    Employer: [employer]  Tobacco Use  . Smoking status: Never Smoker  . Smokeless tobacco: Never Used  Substance and Sexual Activity  . Alcohol use: No    Alcohol/week: 0.0 standard drinks  . Drug use: No  . Sexual activity: Yes    Partners: Male    Birth control/protection: Surgical    Comment: Husband's vasectomy  Other Topics Concern  . Not on file  Social History Narrative  . Not on file   Social Determinants of Health   Financial Resource Strain:   . Difficulty of Paying Living Expenses:   Food Insecurity:   . Worried About Charity fundraiser in the Last Year:   . Arboriculturist in the Last Year:   Transportation Needs:   . Film/video editor (Medical):   Marland Kitchen Lack of Transportation (Non-Medical):   Physical Activity:   . Days of Exercise per Week:   . Minutes of Exercise per Session:   Stress:   . Feeling of Stress :   Social Connections:   . Frequency of Communication with Friends and Family:   . Frequency of Social Gatherings with Friends and Family:   . Attends Religious Services:   . Active Member of Clubs or Organizations:   . Attends Theatre manager Meetings:   Marland Kitchen Marital Status:   Intimate Partner Violence:   . Fear of Current or Ex-Partner:   . Emotionally Abused:   Marland Kitchen Physically Abused:   . Sexually Abused:     Past Medical History, Surgical history, Social history, and Family history were reviewed and updated as appropriate.   Please see review of systems for further details on the patient's review from today.   Objective:   Physical Exam:  There were no vitals taken for this visit.  Physical Exam Constitutional:      General: She is not in acute distress.    Appearance: Normal appearance. She is well-developed.  Musculoskeletal:        General: No deformity.  Neurological:     Mental Status:  She is alert and oriented to person, place, and time.     Motor: No tremor.     Coordination: Coordination normal.     Gait: Gait normal.  Psychiatric:        Attention and Perception: She is attentive.        Mood and Affect: Mood is anxious. Mood is not depressed. Affect is not labile, blunt, angry or inappropriate.        Speech: Speech normal.        Behavior: Behavior normal.        Thought Content: Thought content normal. Thought content does not include homicidal or suicidal ideation. Thought content does not include homicidal or suicidal plan.        Cognition and Memory: Cognition normal.        Judgment: Judgment normal.     Comments: Insight intact. No auditory or visual hallucinations. No delusions.      Lab Review:     Component Value Date/Time   NA 144 04/10/2019 0930   K 4.3 04/10/2019 0930   CL 113 (H) 04/10/2019 0930   CO2 24 04/10/2019 0930   GLUCOSE 90 04/10/2019 0930   BUN 19 04/10/2019 0930   CREATININE 0.87 04/10/2019 0930   CREATININE 0.80 11/24/2018 1503   CALCIUM 9.2 04/10/2019 0930   PROT 6.4 (L) 04/10/2019 0930   ALBUMIN 4.1 04/10/2019 0930   AST 18 04/10/2019 0930   ALT 20 04/10/2019 0930   ALKPHOS 89 04/10/2019 0930   BILITOT 0.7 04/10/2019 0930   GFRNONAA >60 04/10/2019  0930   GFRAA >60 04/10/2019 0930       Component Value Date/Time   WBC 4.6 04/10/2019 0930   RBC 4.68 04/10/2019 0930   HGB 14.3 04/10/2019 0930   HCT 44.9 04/10/2019 0930   PLT 241 04/10/2019 0930   MCV 95.9 04/10/2019 0930   MCV 91.8 08/03/2015 1159   MCH 30.6 04/10/2019 0930   MCHC 31.8 04/10/2019 0930   RDW 12.4 04/10/2019 0930   LYMPHSABS 1.5 04/10/2019 0930   MONOABS 0.4 04/10/2019 0930   EOSABS 0.1 04/10/2019 0930   BASOSABS 0.0 04/10/2019 0930    No results found for: POCLITH, LITHIUM   No results found for: PHENYTOIN, PHENOBARB, VALPROATE, CBMZ   .res Assessment: Plan:    Generalized anxiety disorder - Plan: ALPRAZolam (XANAX XR) 0.5 MG 24 hr tablet, PARoxetine (PAXIL) 40 MG tablet, topiramate (TOPAMAX) 50 MG tablet  Panic disorder with agoraphobia - Plan: ALPRAZolam (XANAX XR) 0.5 MG 24 hr tablet, PARoxetine (PAXIL) 40 MG tablet  Insomnia due to mental condition - Plan: traZODone (DESYREL) 50 MG tablet, zolpidem (AMBIEN) 10 MG tablet  Major depression, recurrent, full remission ([place]) - Plan: ARIPiprazole (ABILIFY) 5 MG tablet, PARoxetine (PAXIL) 40 MG tablet  Shift work sleep disorder - Plan: zolpidem (AMBIEN) 10 MG tablet   Overall med combo has worked well for depression and anxiety but is better than last time...  Disc options for benefit more: increase Abilify, topiramate off label, prn propranolol. Disc SE in detail of each option.   Prefer to avoid increasing Xanax which can make HER sleepy in the daytime.    Discussed potential metabolic side effects associated with atypical antipsychotics, as well as potential risk for movement side effects. Advised pt to contact office if movement side effects occur.   We discussed the short-term risks associated with benzodiazepines including sedation and increased fall risk among others.  Discussed long-term side effect risk including dependence, potential withdrawal  symptoms, and the potential eventual dose-related  risk of dementia.  Newer studies published 2020 dispute risk of dementia with benzodiazepines.  And disc risk of Ambien amnesia.  Consider switch to Ativan from Xanax to reduce the sedation from Bz.    Better self care and more sleep would help. Protect SLEEP.  Option disc for weight loss Ozempic.  Gave JAMA study to discuss with doctor.  cONTINUE   topiramate bc help weight gain risk to 200mg  daily.  Disc SE.  Rx prn propranolol 20-40 mg BID for anxiety.   Disc polypharmacy is medcially necessary.  It has taken a lot of meds and combination of mechanisms in order to manage her depression and anxiety symptoms.  She tolerates the higher dose of paroxetine without side effects.  Panic was not managed with less paroxetine.  Disc SE in detail and SSRI withdrawal sx. Discussed serotonin syndrome.  Avoid dextromethorphan, tramadol which can increase risk. Continue alprazolam XR 0.5 mg every morning and 1 mg nightly Continue aripiprazole 5 mg daily Continue paroxetine 80 mg daily Continue propranolol 20 mg tablets 1-2 twice daily as needed anxiety Continue topiramate 50 mg tablets 2 twice daily Continue trazodone 100 mg nightly Continue zolpidem 5 to 10 mg nightly as needed insomnia She typically will alternate trazodone or Ambien depending on her work schedule.  This appt was 30 mins.  FU 6 mos  Lynder Parents, MD, DFAPA     Please see After Visit Summary for patient specific instructions.  Future Appointments  Date Time Provider [place]  02/19/2020 10:30 AM Cottle, Billey Co., MD CP-CP None    No orders of the defined types were placed in this encounter.     -------------------------------

## 2019-08-28 ENCOUNTER — Other Ambulatory Visit: Payer: Self-pay | Admitting: Psychiatry

## 2019-09-21 MED FILL — ARIPIPRAZOLE 5 MG TABS: 5 | 90 days supply | Qty: 90 | Fill #0

## 2019-09-21 MED FILL — TOPIRAMATE 50 MG TABLET: 50 | 90 days supply | Qty: 360 | Fill #0

## 2019-09-21 MED FILL — PARoxetine HCL 40 MG TABS: 40 | 90 days supply | Qty: 180 | Fill #0

## 2019-10-04 ENCOUNTER — Telehealth: Payer: Self-pay

## 2019-10-04 NOTE — Telephone Encounter (Signed)
PA initiated via Covermymeds; KEY: [id number]. Awaiting determination.

## 2019-10-09 NOTE — Telephone Encounter (Signed)
Further PA questions received via fax from [place]. Questions answered and faxed back to [phone]. Awaiting determination.

## 2019-10-10 NOTE — Telephone Encounter (Signed)
PA approved.   The request has been approved. The authorization is effective for a maximum of 12 fills from 10/10/2019 to 10/08/2020, as long as the member is enrolled in their current health plan. The request was approved as submitted. This request is approved for up to 50mL per 30 days. A written notification letter will follow with additional details.

## 2019-12-11 ENCOUNTER — Encounter: Payer: Self-pay | Admitting: Family Medicine

## 2019-12-11 ENCOUNTER — Other Ambulatory Visit: Payer: Self-pay | Admitting: Family Medicine

## 2019-12-11 ENCOUNTER — Encounter (INDEPENDENT_AMBULATORY_CARE_PROVIDER_SITE_OTHER): Payer: Self-pay

## 2019-12-11 DIAGNOSIS — Z1211 Encounter for screening for malignant neoplasm of colon: Secondary | ICD-10-CM

## 2019-12-11 NOTE — Telephone Encounter (Signed)
Please advise 

## 2019-12-11 NOTE — Telephone Encounter (Signed)
Already did this

## 2019-12-11 NOTE — Telephone Encounter (Signed)
Referrals placed If GI is due to problem it will need to be changed to ov not colonoscopy

## 2019-12-13 ENCOUNTER — Other Ambulatory Visit: Payer: Self-pay

## 2019-12-13 ENCOUNTER — Ambulatory Visit (INDEPENDENT_AMBULATORY_CARE_PROVIDER_SITE_OTHER): Payer: No Typology Code available for payment source | Admitting: Family Medicine

## 2019-12-13 ENCOUNTER — Encounter (INDEPENDENT_AMBULATORY_CARE_PROVIDER_SITE_OTHER): Payer: Self-pay | Admitting: Family Medicine

## 2019-12-13 VITALS — BP 99/66 | HR 68 | Temp 97.9°F | Ht 64.0 in | Wt 185.0 lb

## 2019-12-13 DIAGNOSIS — G4726 Circadian rhythm sleep disorder, shift work type: Secondary | ICD-10-CM | POA: Diagnosis not present

## 2019-12-13 DIAGNOSIS — Z9189 Other specified personal risk factors, not elsewhere classified: Secondary | ICD-10-CM

## 2019-12-13 DIAGNOSIS — E669 Obesity, unspecified: Secondary | ICD-10-CM

## 2019-12-13 DIAGNOSIS — R7301 Impaired fasting glucose: Secondary | ICD-10-CM

## 2019-12-13 DIAGNOSIS — R5383 Other fatigue: Secondary | ICD-10-CM | POA: Diagnosis not present

## 2019-12-13 DIAGNOSIS — M818 Other osteoporosis without current pathological fracture: Secondary | ICD-10-CM

## 2019-12-13 DIAGNOSIS — Z6831 Body mass index (BMI) 31.0-31.9, adult: Secondary | ICD-10-CM

## 2019-12-13 DIAGNOSIS — M7071 Other bursitis of hip, right hip: Secondary | ICD-10-CM

## 2019-12-13 DIAGNOSIS — F3340 Major depressive disorder, recurrent, in remission, unspecified: Secondary | ICD-10-CM

## 2019-12-13 DIAGNOSIS — Z780 Asymptomatic menopausal state: Secondary | ICD-10-CM

## 2019-12-13 DIAGNOSIS — R0602 Shortness of breath: Secondary | ICD-10-CM | POA: Diagnosis not present

## 2019-12-13 DIAGNOSIS — Z0289 Encounter for other administrative examinations: Secondary | ICD-10-CM

## 2019-12-13 DIAGNOSIS — G4719 Other hypersomnia: Secondary | ICD-10-CM

## 2019-12-13 DIAGNOSIS — E7849 Other hyperlipidemia: Secondary | ICD-10-CM

## 2019-12-13 MED ORDER — WEGOVY 0.25 MG/0.5ML ~~LOC~~ SOAJ: 0.2500 mg | SUBCUTANEOUS | 0 refills | Status: DC

## 2019-12-13 NOTE — Progress Notes (Signed)
Dear Dr. Carollee Herter,   Thank you for referring Brooke [name] to our clinic. The following note includes my evaluation and treatment recommendations.  Chief Complaint:   Brooke [name] (MR# [id number]) is a 49 y.o. female who presents for evaluation and treatment of obesity and related comorbidities. Current BMI is Body mass index is 31.76 kg/m. Brooke [name] has been struggling with her weight for many years and has been unsuccessful in either losing weight, maintaining weight loss, or reaching her healthy weight goal.  Brooke [name] is currently in the action stage of change and ready to dedicate time achieving and maintaining a healthier weight. Brooke [name] is interested in becoming our patient and working on intensive lifestyle modifications including (but not limited to) diet and exercise for weight loss.  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  She lives with her husband and 2 daughters (17, 52) and her 20-year-old grandson.  She feels that she gained weight due to psychiatric medications, night eating, and being over 55 years old.  Brooke [name] provided the following food recall today:  She skips meals, so her hunger is increased when she eats.  She eats supper sometimes around 8-9 pm.  She says she has "given up on watching what I eat".  She drinks milkshakes from [employer] for a reward.    Brooke [name]'s habits were reviewed today and are as follows: Her family eats meals together, she thinks her family will eat healthier with her, her desired weight loss is 65 pounds, she started gaining weight after age 1, her heaviest weight ever was 190 pounds, she craves milkshakes, she snacks frequently in the evenings, she wakes up frequently in the middle of the night to eat, she skips breakfast and/or lunch frequently, she frequently makes poor food choices and she struggles with emotional eating.  Depression Screen Brooke [name]'s Food and Mood (modified PHQ-9)  score was 15.  Depression screen PHQ 2/9 12/13/2019  Decreased Interest 3  Down, Depressed, Hopeless 2  PHQ - 2 Score 5  Altered sleeping 2  Tired, decreased energy 3  Change in appetite 2  Feeling bad or failure about yourself  2  Trouble concentrating 1  Moving slowly or fidgety/restless 0  Suicidal thoughts 0  PHQ-9 Score 15  Difficult doing work/chores Not difficult at all   Subjective:   1. Other fatigue Brooke [name] admits to daytime somnolence and reports waking up still tired. Patent has a history of symptoms of daytime fatigue, morning fatigue, morning headache and snoring. Brooke [name] generally gets 4 hours of sleep per night, and states that she has poor quality sleep. Snoring is present. Apneic episodes are not present. Epworth Sleepiness Score is 21.  2. SOB (shortness of breath) on exertion Brooke [name] notes increasing shortness of breath with exercising and seems to be worsening over time with weight gain. She notes getting out of breath sooner with activity than she used to. This has gotten worse recently. Brooke [name] denies shortness of breath at rest or orthopnea.  3. Shift work sleep disorder Brooke [name] has some difficulty sleeping due to shift work sleep disorder.  She is an Therapist, sports and works 60 hours per week.  4. Bursitis of right hip, unspecified bursa Brooke [name] is status post hip surgery with Dr. Gladstone Lighter in December.  She is still having pain.  5. Other hyperlipidemia Brooke [name] has hyperlipidemia and has been trying to improve her cholesterol levels with intensive lifestyle modification including a low saturated fat diet, exercise and weight loss.  She denies any chest pain, claudication or myalgias.  Lab Results  Component Value Date   ALT 20 04/10/2019   AST 18 04/10/2019   ALKPHOS 89 04/10/2019   BILITOT 0.7 04/10/2019   Lab Results  Component Value Date   CHOL 233 (H) 11/24/2018   HDL 52 11/24/2018   LDLCALC 158 (H) 11/24/2018   LDLDIRECT 130.6 09/05/2009    TRIG 111 11/24/2018   CHOLHDL 4.5 11/24/2018   6. Other osteoporosis, unspecified pathological fracture presence Brooke [name] was shown to have osteoporosis on DEXA on 12/10/2013.  7. Postmenopausal Brooke [name] is currently postmenopausal.  8. Fasting hyperglycemia Brooke [name] has a history of some elevated blood glucose readings without a diagnosis of diabetes. She denies polyphagia.  9. Excessive daytime sleepiness Brooke [name] endorses snoring and morning headaches 2 times per week.  She had a sleep study years ago that showed RLS.  Epworth score is 21.  10. Recurrent major depressive disorder, in remission Brooke [name]), with emotional eating Brooke [name] is followed by Dr. [name].  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  Brooke [name] is struggling with emotional eating and using food for comfort to the extent that it is negatively impacting her health. She has been working on behavior modification techniques to help reduce her emotional eating and has been unsuccessful. She shows no sign of suicidal or homicidal ideations.  PHQ-9 is 15.  Assessment/Plan:   1. Other fatigue Brooke [name] does feel that her weight is causing her energy to be lower than it should be. Fatigue may be related to obesity, depression or many other causes. Labs will be ordered, and in the meanwhile, Brooke [name] will focus on self care including making healthy food choices, increasing physical activity and focusing on stress reduction.  - EKG 12-Lead - Anemia panel - CBC with Differential/Platelet - T4, free - TSH - VITAMIN D 25 Hydroxy (Vit-D Deficiency, Fractures)  2. SOB (shortness of breath) on exertion Brooke [name] does feel that she gets out of breath more easily that she used to when she exercises. Brooke [name]'s shortness of breath appears to be obesity related and exercise induced. She has agreed to work on weight loss and gradually increase exercise to treat her exercise induced shortness of breath. Will  continue to monitor closely.  - Comprehensive metabolic panel  3. Shift work sleep disorder Will continue to monitor. This issue directly impacts care plan for optimization of BMI and metabolic health as it impacts the patient's ability to make lifestyle changes.  4. Bursitis of right hip, unspecified bursa Will follow because mobility and pain control are important for weight management.  5. Other hyperlipidemia . Cardiovascular risk and specific lipid/LDL goals reviewed.  We discussed several lifestyle modifications today and Jadzia will continue to work on diet, exercise and weight loss efforts.   - Lipid Panel With LDL/HDL Ratio  6. Other osteoporosis, unspecified pathological fracture presence We will continue to monitor. Orders and follow up as documented in patient record.  Counseling . Osteoporosis happens when your bones get thin and weak. This can cause your bones to break (fracture) more easily.  . Exercise is very important to keep bones strong. Focus on strength training (lifting weights) and exercises that make your muscles work to hold your body weight up (weight-bearing exercises). These include tai chi, yoga, and walking.  . Limit alcohol intake to no more than 1 drink a day for nonpregnant women and 2 drinks a day for men. One drink equals 12 oz of beer, 5  oz of wine, or 1 oz of hard liquor. . Do not use any products that have nicotine or tobacco in them.  . Preventing falls o Use tools to help you move around (mobility aids) as needed. These include canes, walkers, scooters, and crutches. o Keep rooms well-lit and free of clutter. o Wear shoes that fit you well and support your feet. . Eat plenty of calcium and Vitamin D as these nutrients are good for your bones.   - VITAMIN D 25 Hydroxy (Vit-D Deficiency, Fractures)  7. Postmenopausal Will continue to monitor symptoms as they relate to her weight loss journey.  8. Fasting hyperglycemia Fasting labs will be  obtained and results with be discussed with Brooke [name] in 2 weeks at her follow up visit. In the meanwhile Azariyah was started on a lower simple carbohydrate diet and will work on weight loss efforts.  - Hemoglobin A1c - Insulin, random  9. Excessive daytime sleepiness Will continue to monitor symptoms as they relate to her weight loss journey.  10. Recurrent major depressive disorder, in remission [name]), with emotional eating Behavior modification techniques were discussed today to help Brooke [name] deal with her emotional/non-hunger eating behaviors.  Orders and follow up as documented in patient record.   11. At risk for deficient intake of food Brooke [name] was given approximately 15 minutes of deficit intake of food prevention counseling today. Brooke [name] is at risk for eating too few calories based on current food recall. She was encouraged to focus on meeting caloric and protein goals according to her recommended meal plan.   12. Class 1 obesity with serious comorbidity and body mass index (BMI) of 31.0 to 31.9 in adult, unspecified obesity type Will trial long-acting GLP-1 receptor agonist, Brooke [name]. Clinical data have revealed that these therapies improve glycemic control while reducing body weight and systolic blood pressure. Furthermore, incidence of hypoglycemia is relatively low with these treatments because of their glucose-dependent mechanism of action. Long-acting GLP-1 receptor agonists are generally well tolerated, with transient nausea being the most frequently reported adverse effect.  We have reviewed medication options. After discussion, patient would like to start below medication. Expectations, risks, and potential side effects reviewed.   - Semaglutide-Weight Management (WEGOVY) 0.25 MG/0.5ML SOAJ; Inject 0.25 mg into the skin once a week.  Dispense: 2 mL; Refill: 0  Brooke [name] is currently in the action stage of change and her goal is to continue with weight loss efforts. I  recommend Brooke [name] begin the structured treatment plan as follows:  She has agreed to the Category 1 Plan.  Exercise goals: No exercise has been prescribed at this time.   Behavioral modification strategies: increasing lean protein intake, decreasing simple carbohydrates, increasing vegetables and increasing high fiber foods.  She was informed of the importance of frequent follow-up visits to maximize her success with intensive lifestyle modifications for her multiple health conditions. She was informed we would discuss her lab results at her next visit unless there is a critical issue that needs to be addressed sooner. Brooke [name] agreed to keep her next visit at the agreed upon time to discuss these results.  Objective:   Blood pressure 99/66, pulse 68, temperature 97.9 F (36.6 C), temperature source Oral, height _0  (1.626 m), weight 185 lb (83.9 kg), last menstrual period 04/03/2018, SpO2 99 %. Body mass index is 31.76 kg/m.  EKG: Normal sinus rhythm, rate 73 bpm.  Indirect Calorimeter completed today shows a VO2 of 201 and a REE of 1398.  Her calculated basal metabolic rate  is 1499 thus her basal metabolic rate is worse than expected.  General: Cooperative, alert, well developed, in no acute distress. HEENT: Conjunctivae and lids unremarkable. Cardiovascular: Regular rhythm.  Lungs: Normal work of breathing. Neurologic: No focal deficits.   Lab Results  Component Value Date   CREATININE 0.87 04/10/2019   BUN 19 04/10/2019   NA 144 04/10/2019   K 4.3 04/10/2019   CL 113 (H) 04/10/2019   CO2 24 04/10/2019   Lab Results  Component Value Date   ALT 20 04/10/2019   AST 18 04/10/2019   ALKPHOS 89 04/10/2019   BILITOT 0.7 04/10/2019   Lab Results  Component Value Date   HGBA1C 5.5 03/05/2013   Lab Results  Component Value Date   TSH 1.54 11/24/2018   Lab Results  Component Value Date   CHOL 233 (H) 11/24/2018   HDL 52 11/24/2018   LDLCALC 158 (H) 11/24/2018    LDLDIRECT 130.6 09/05/2009   TRIG 111 11/24/2018   CHOLHDL 4.5 11/24/2018   Lab Results  Component Value Date   WBC 4.6 04/10/2019   HGB 14.3 04/10/2019   HCT 44.9 04/10/2019   MCV 95.9 04/10/2019   PLT 241 04/10/2019   Attestation Statements:   This is the patient's first visit at [facility]. The patient's NEW PATIENT PACKET was reviewed at length. Included in the packet: current and past health history, medications, allergies, ROS, gynecologic history (women only), surgical history, family history, social history, weight history, weight loss surgery history (for those that have had weight loss surgery), nutritional evaluation, mood and food questionnaire, PHQ9, Epworth questionnaire, sleep habits questionnaire, patient life and health improvement goals questionnaire. These will all be scanned into the patient's chart under media.   During the visit, I independently reviewed the patient's EKG, bioimpedance scale results, and indirect calorimeter results. I used this information to tailor a meal plan for the patient that will help her to lose weight and will improve her obesity-related conditions going forward. I performed a medically necessary appropriate examination and/or evaluation. I discussed the assessment and treatment plan with the patient. The patient was provided an opportunity to ask questions and all were answered. The patient agreed with the plan and demonstrated an understanding of the instructions. Labs were ordered at this visit and will be reviewed at the next visit unless more critical results need to be addressed immediately. Clinical information was updated and documented in the EMR.   I, Water quality scientist, CMA, am acting as transcriptionist for Briscoe Deutscher, DO  I have reviewed the above documentation for accuracy and completeness, and I agree with the above. Briscoe Deutscher, DO

## 2019-12-14 LAB — COMPREHENSIVE METABOLIC PANEL
ALT: 29 IU/L (ref 0–32)
AST: 22 IU/L (ref 0–40)
Albumin/Globulin Ratio: 2.2 (ref 1.2–2.2)
Albumin: 4.1 g/dL (ref 3.8–4.9)
Alkaline Phosphatase: 130 IU/L — ABNORMAL HIGH (ref 48–121)
BUN/Creatinine Ratio: 19 (ref 9–23)
BUN: 16 mg/dL (ref 6–24)
Bilirubin Total: <0.2 mg/dL (ref 0.0–1.2)
CO2: 23 mmol/L (ref 20–29)
Calcium: 9.1 mg/dL (ref 8.7–10.2)
Chloride: 109 mmol/L — ABNORMAL HIGH (ref 96–106)
Creatinine, Ser: 0.83 mg/dL (ref 0.57–1.00)
GFR calc Af Amer: 94 mL/min/{1.73_m2} (ref >59)
GFR calc non Af Amer: 82 mL/min/{1.73_m2} (ref >59)
Globulin, Total: 1.9 g/dL (ref 1.5–4.5)
Glucose: 86 mg/dL (ref 65–99)
Potassium: 4.4 mmol/L (ref 3.5–5.2)
Sodium: 142 mmol/L (ref 134–144)
Total Protein: 6 g/dL (ref 6.0–8.5)

## 2019-12-14 LAB — CBC WITH DIFFERENTIAL/PLATELET
Basophils Absolute: 0 10*3/uL (ref 0.0–0.2)
Basos: 0 %
EOS (ABSOLUTE): 0.2 10*3/uL (ref 0.0–0.4)
Eos: 4 %
Hemoglobin: 14.7 g/dL (ref 11.1–15.9)
Immature Grans (Abs): 0 10*3/uL (ref 0.0–0.1)
Immature Granulocytes: 0 %
Lymphocytes Absolute: 1.6 10*3/uL (ref 0.7–3.1)
Lymphs: 34 %
MCH: 30.7 pg (ref 26.6–33.0)
MCHC: 33.3 g/dL (ref 31.5–35.7)
MCV: 92 fL (ref 79–97)
Monocytes Absolute: 0.4 10*3/uL (ref 0.1–0.9)
Monocytes: 8 %
Neutrophils Absolute: 2.5 10*3/uL (ref 1.4–7.0)
Neutrophils: 54 %
Platelets: 218 10*3/uL (ref 150–450)
RBC: 4.79 x10E6/uL (ref 3.77–5.28)
RDW: 12.4 % (ref 11.7–15.4)
WBC: 4.6 10*3/uL (ref 3.4–10.8)

## 2019-12-14 LAB — ANEMIA PANEL
Ferritin: 70 ng/mL (ref 15–150)
Folate, Hemolysate: 470 ng/mL
Folate, RBC: 1063 ng/mL (ref >498)
Hematocrit: 44.2 % (ref 34.0–46.6)
Iron Saturation: 32 % (ref 15–55)
Iron: 83 ug/dL (ref 27–159)
Retic Ct Pct: 1.9 % (ref 0.6–2.6)
Total Iron Binding Capacity: 258 ug/dL (ref 250–450)
UIBC: 175 ug/dL (ref 131–425)
Vitamin B-12: 583 pg/mL (ref 232–1245)

## 2019-12-14 LAB — LIPID PANEL WITH LDL/HDL RATIO
Cholesterol, Total: 216 mg/dL — ABNORMAL HIGH (ref 100–199)
HDL: 38 mg/dL — ABNORMAL LOW (ref >39)
LDL Chol Calc (NIH): 148 mg/dL — ABNORMAL HIGH (ref 0–99)
LDL/HDL Ratio: 3.9 ratio — ABNORMAL HIGH (ref 0.0–3.2)
Triglycerides: 165 mg/dL — ABNORMAL HIGH (ref 0–149)
VLDL Cholesterol Cal: 30 mg/dL (ref 5–40)

## 2019-12-14 LAB — TSH: TSH: 3.96 u[IU]/mL (ref 0.450–4.500)

## 2019-12-14 LAB — VITAMIN D 25 HYDROXY (VIT D DEFICIENCY, FRACTURES): Vit D, 25-Hydroxy: 19 ng/mL — ABNORMAL LOW (ref 30.0–100.0)

## 2019-12-14 LAB — HEMOGLOBIN A1C
Est. average glucose Bld gHb Est-mCnc: 111 mg/dL
Hgb A1c MFr Bld: 5.5 % (ref 4.8–5.6)

## 2019-12-14 LAB — T4, FREE: Free T4: 0.96 ng/dL (ref 0.82–1.77)

## 2019-12-14 LAB — INSULIN, RANDOM: INSULIN: 12 u[IU]/mL (ref 2.6–24.9)

## 2019-12-21 IMAGING — US US ABDOMEN LIMITED
1 series · 14 of 25 positions shown · non-contrast
Comparison: None.

CLINICAL DATA: Right upper quadrant abdominal pain

EXAM:
ULTRASOUND ABDOMEN LIMITED RIGHT UPPER QUADRANT

[Series 1: us abdomen limited · 14 of 56 slices shown]
[im 1/56]
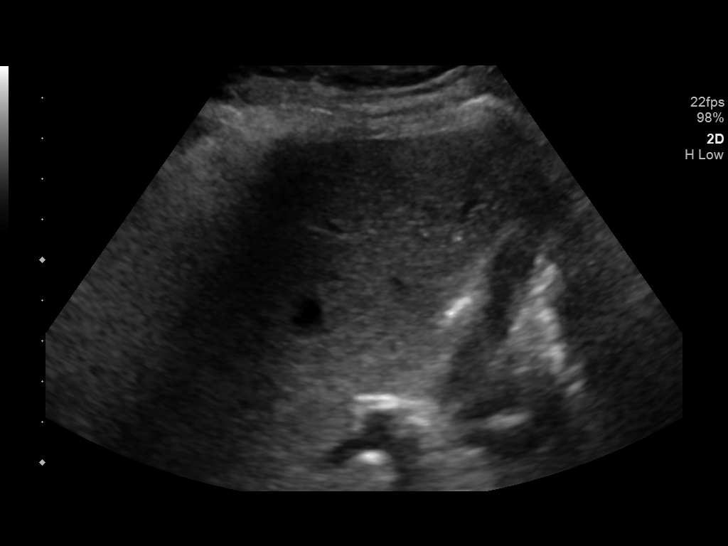
[im 5/56]
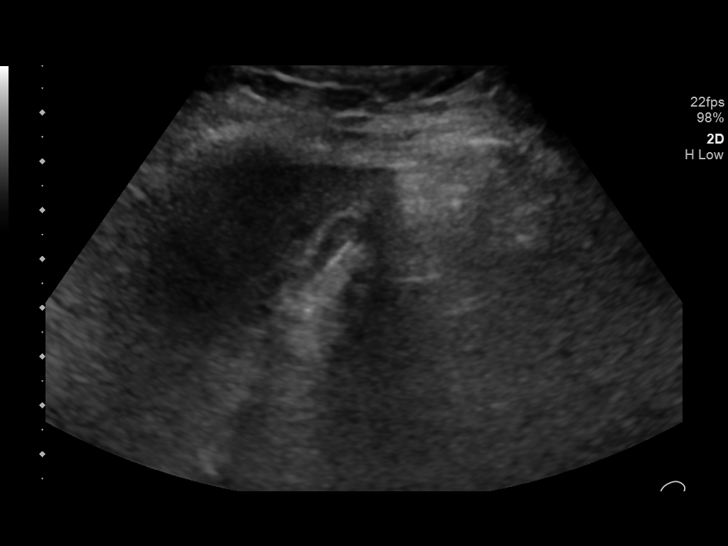
[im 10/56]
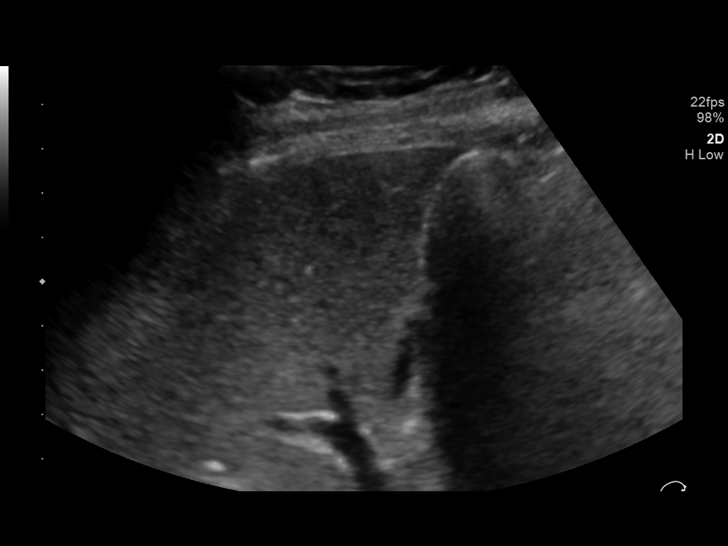
[im 14/56]
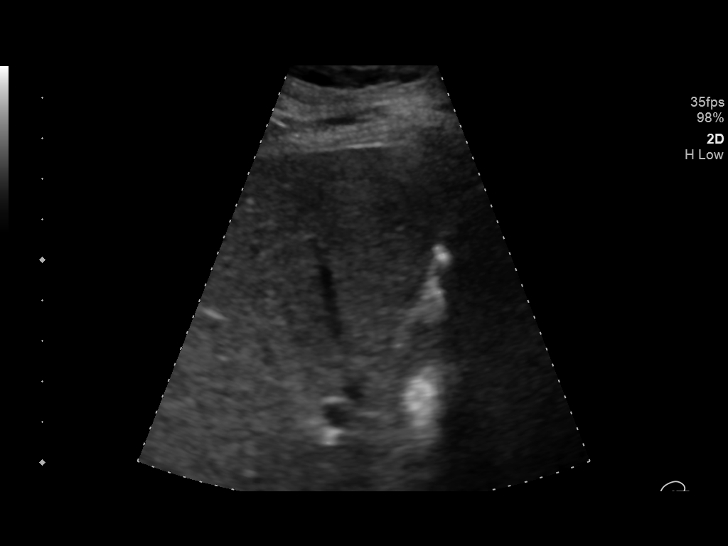
[im 19/56]
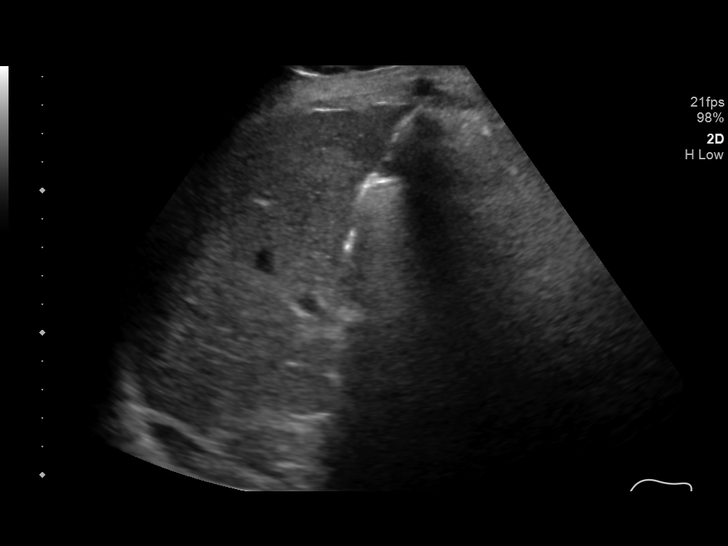
[im 21/56]
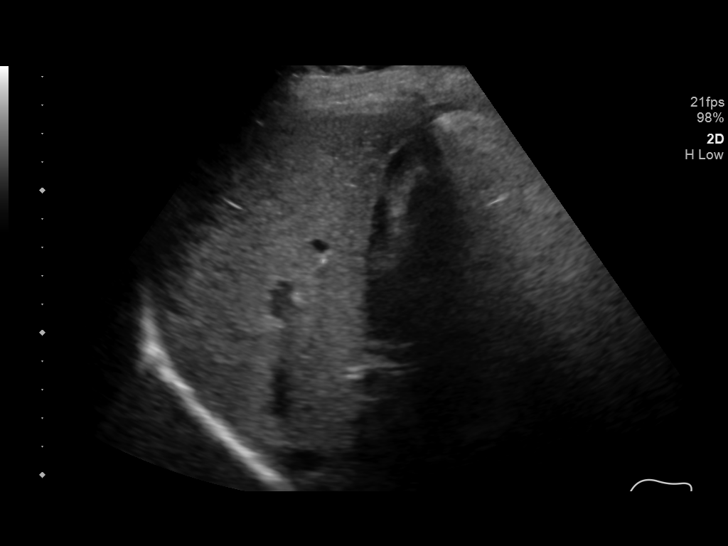
[im 26/56]
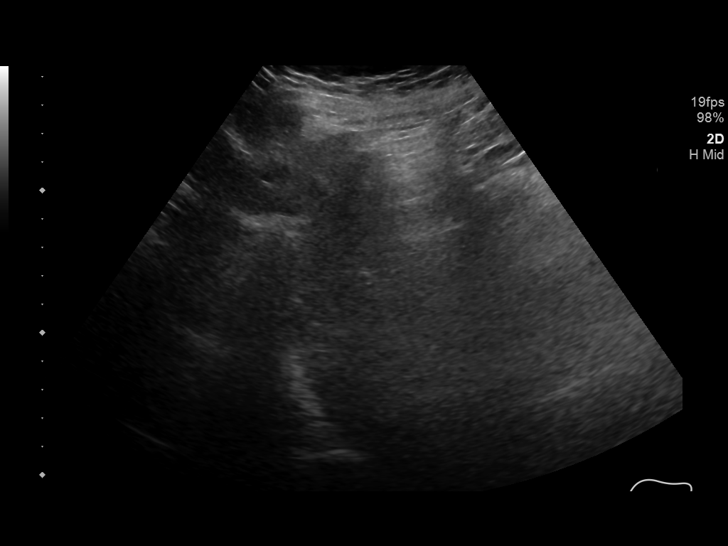
[im 30/56]
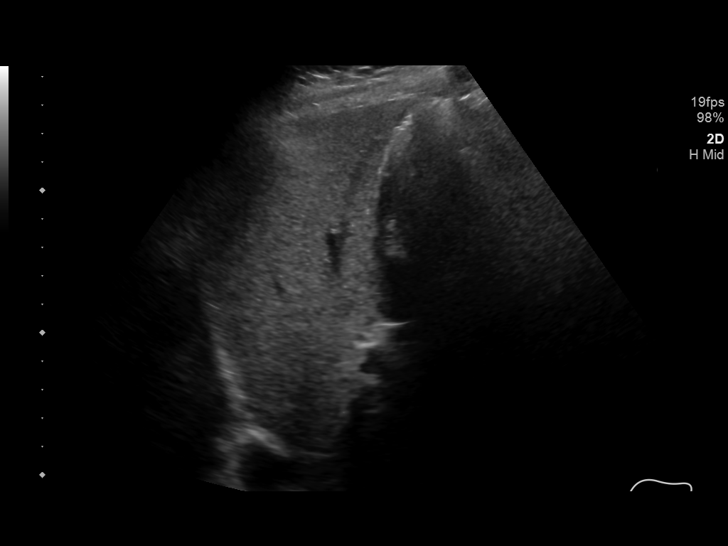
[im 35/56]
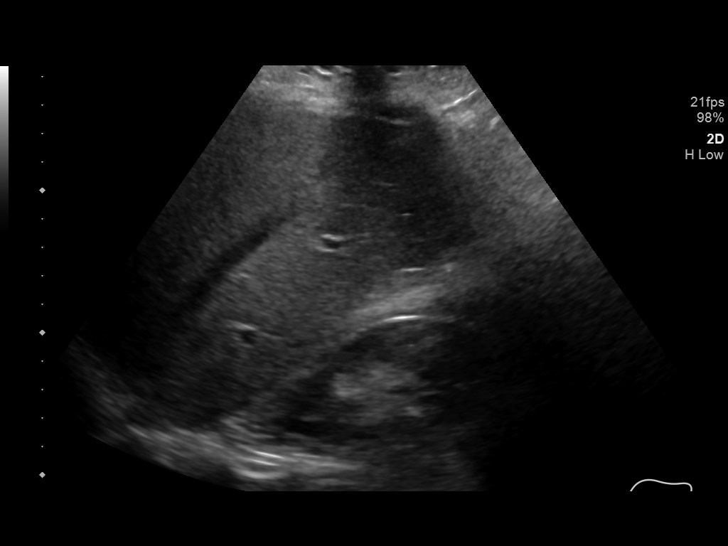
[im 37/56]
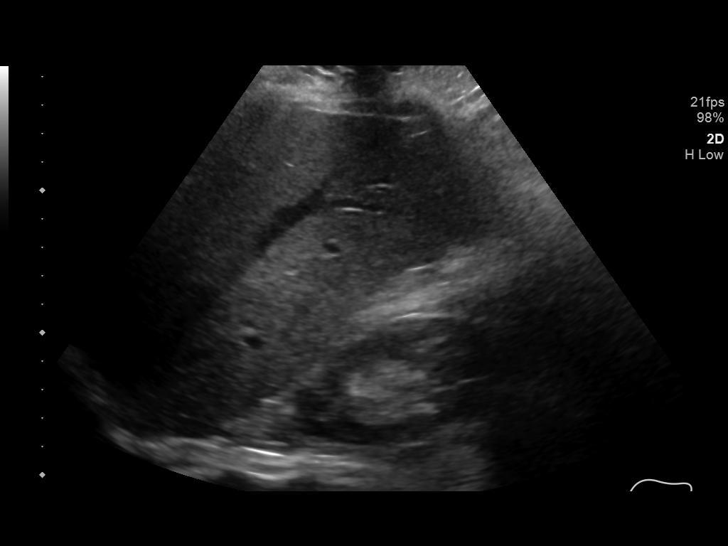
[im 42/56]
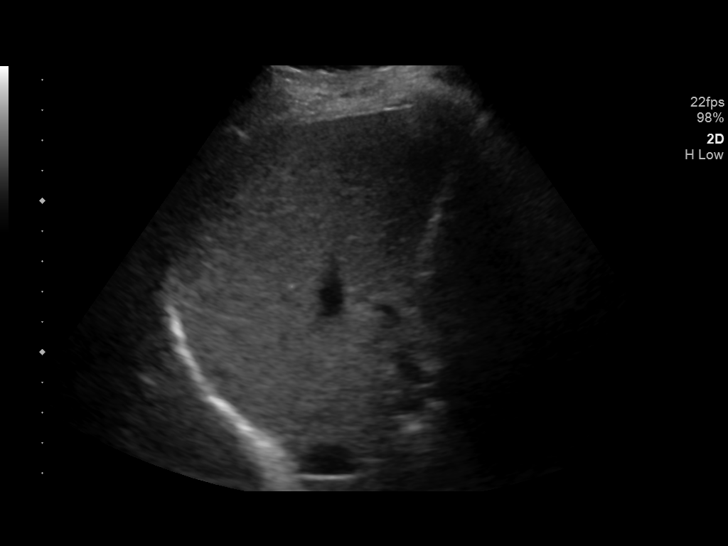
[im 46/56]
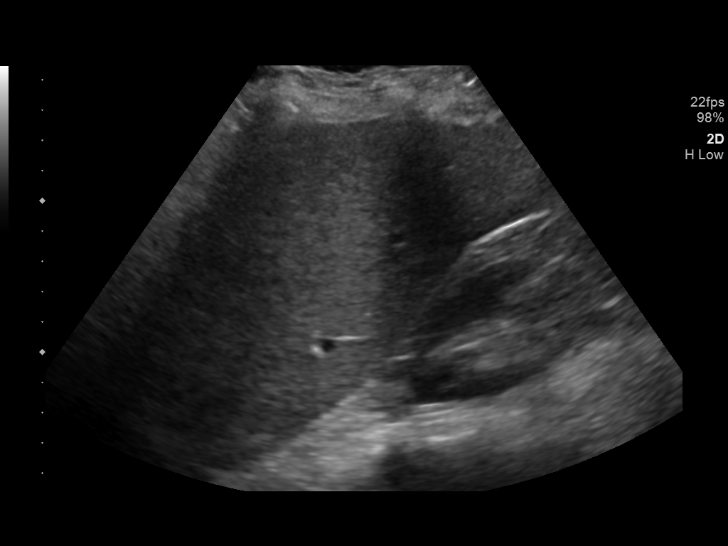
[im 51/56]
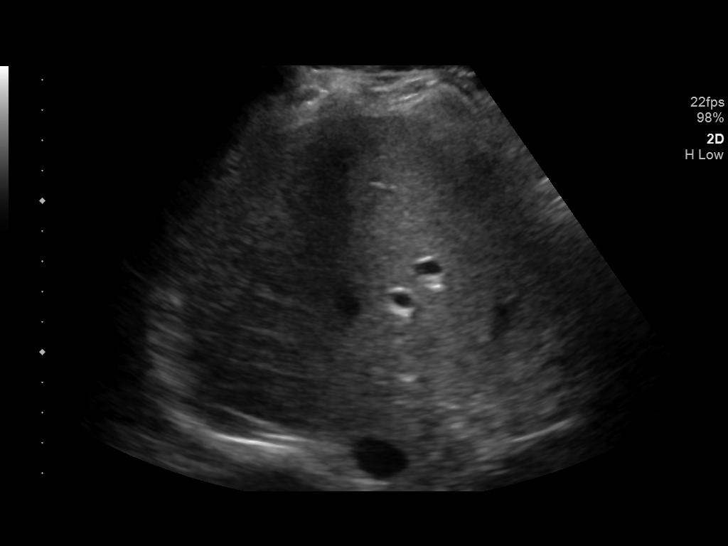
[im 56/56]
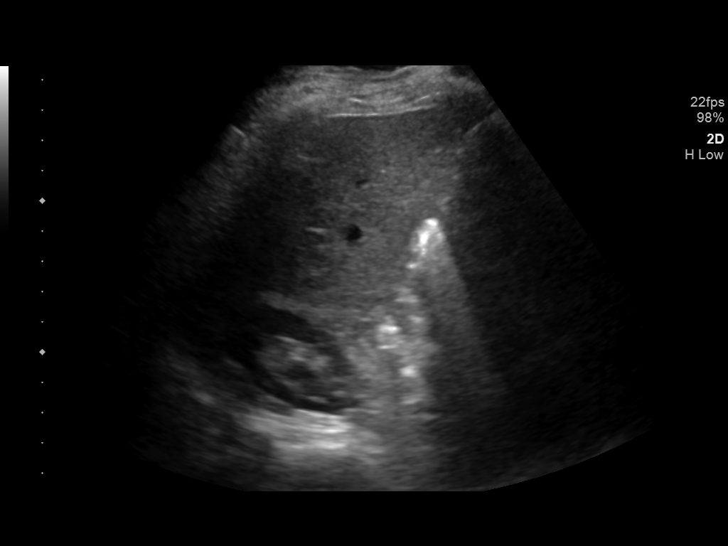

[14 of 25 positions shown; findings below may reference images not displayed]

FINDINGS: Gallbladder:

Gallbladder is contracted. No visible stones or wall thickening.
Negative sonographic Arceo.

Common bile duct:

Diameter: Not visualized

Liver:

Mildly increased echotexture suggesting fatty infiltration. No focal
hepatic abnormality or biliary ductal dilatation. Portal vein is
patent on color Doppler imaging with normal direction of blood flow
towards the liver.
IMPRESSION: Contracted gallbladder. No cholelithiasis or evidence of acute
cholecystitis.

Mild fatty infiltration of the liver.

## 2019-12-24 MED FILL — PARoxetine HCL 40 MG TABS: 40 | 90 days supply | Qty: 180 | Fill #1

## 2020-01-03 ENCOUNTER — Encounter (INDEPENDENT_AMBULATORY_CARE_PROVIDER_SITE_OTHER): Payer: Self-pay | Admitting: Family Medicine

## 2020-01-03 ENCOUNTER — Ambulatory Visit (INDEPENDENT_AMBULATORY_CARE_PROVIDER_SITE_OTHER): Payer: No Typology Code available for payment source | Admitting: Family Medicine

## 2020-01-03 ENCOUNTER — Other Ambulatory Visit: Payer: Self-pay

## 2020-01-03 VITALS — BP 97/66 | HR 77 | Temp 98.3°F | Ht 64.0 in | Wt 173.0 lb

## 2020-01-03 DIAGNOSIS — Z6830 Body mass index (BMI) 30.0-30.9, adult: Secondary | ICD-10-CM

## 2020-01-03 DIAGNOSIS — E782 Mixed hyperlipidemia: Secondary | ICD-10-CM | POA: Diagnosis not present

## 2020-01-03 DIAGNOSIS — E559 Vitamin D deficiency, unspecified: Secondary | ICD-10-CM

## 2020-01-03 DIAGNOSIS — Z9189 Other specified personal risk factors, not elsewhere classified: Secondary | ICD-10-CM

## 2020-01-03 DIAGNOSIS — E669 Obesity, unspecified: Secondary | ICD-10-CM

## 2020-01-03 MED ORDER — VITAMIN D (ERGOCALCIFEROL) 1.25 MG (50000 UNIT) PO CAPS: 50000.0000 [IU] | ORAL_CAPSULE | ORAL | 0 refills | Status: DC

## 2020-01-03 MED ORDER — WEGOVY 1 MG/0.5ML ~~LOC~~ SOAJ: 1.0000 mg | SUBCUTANEOUS | 0 refills | Status: DC

## 2020-01-03 MED ORDER — WEGOVY 0.5 MG/0.5ML ~~LOC~~ SOAJ: 0.5000 mg | SUBCUTANEOUS | 0 refills | Status: DC

## 2020-01-03 MED FILL — ARIPIPRAZOLE 5 MG TABS: 5 | 90 days supply | Qty: 90 | Fill #1

## 2020-01-03 MED FILL — VIT D2 1.25 MG (50,000 UNIT: 1.25 MG | 28 days supply | Qty: 4 | Fill #0

## 2020-01-03 MED FILL — TOPIRAMATE 50 MG TABLET: 50 | 90 days supply | Qty: 360 | Fill #1

## 2020-01-04 MED FILL — WEGOVY 0.25 MG/0.5ML SOAJ: 0.25 | 28 days supply | Qty: 2 | Fill #0

## 2020-01-08 NOTE — Progress Notes (Signed)
Chief Complaint:   OBESITY Brooke [name] is here to discuss her progress with her obesity treatment plan along with follow-up of her obesity related diagnoses. Brooke [name] is on the Category 1 Plan and states she is following her eating plan approximately 100% of the time. Brooke [name] states she is exercising for 0 minutes 0 times per week.  Today's visit was #: 2 Starting weight: 185 lbs Starting date: 12/13/2019 Today's weight: 173 lbs Today's date: 01/03/2020 Total lbs lost to date: 12 lbs Total lbs lost since last in-office visit: 12 lbs Total weight loss percentage to date: -6.49%  Interim History: Brooke [name] will be starting [facility] today.  She has lost 1 inch in her waist since our last visit. She is happy about her progress.  Assessment/Plan:   1. Vitamin D deficiency Current vitamin D is 19.0, tested on 12/13/2019. Not at goal. Optimal goal > 50 ng/dL. There is also evidence to support a goal of >70 ng/dL in patients with cancer and heart disease. Plan: Start Vitamin D @50 ,000 IU every week with follow-up for routine testing of Vitamin D at least 2-3 times per year to avoid over-replacement.  - Start Vitamin D, Ergocalciferol, (DRISDOL) 1.25 MG (50000 UNIT) CAPS capsule; Take 1 capsule (50,000 Units total) by mouth every 7 (seven) days.  Dispense: 4 capsule; Refill: 0  2. Mixed hyperlipidemia  Lab Results  Component Value Date   CHOL 216 (H) 12/13/2019   HDL 38 (L) 12/13/2019   LDLCALC 148 (H) 12/13/2019   LDLDIRECT 130.6 09/05/2009   TRIG 165 (H) 12/13/2019   CHOLHDL 4.5 11/24/2018   Lab Results  Component Value Date   ALT 29 12/13/2019   AST 22 12/13/2019   ALKPHOS 130 (H) 12/13/2019   BILITOT <0.2 12/13/2019   The 10-year ASCVD risk score [name] DC Jr., et al., 2013) is: 1.4%   Values used to calculate the score:     Age: 49 years     Sex: Female     Is Non-Hispanic African American: No     Diabetic: No     Tobacco smoker: No     Systolic Blood Pressure: 97 mmHg      Is BP treated: No     HDL Cholesterol: 38 mg/dL     Total Cholesterol: 216 mg/dL  Course: Not at goal. Plan: Dietary changes: Increase soluble fiber. Decrease simple carbohydrates. Exercise changes: An average 40 minutes of moderate to vigorous-intensity aerobic activity 3 or 4 times per week. Lipid-lowering medications: None.   3. At risk for heart disease Brooke [name] is at a higher than average risk for cardiovascular disease due to obesity and HLD. Lifestyle therapy is the foundation of all approaches to the management of dyslipidemia for ASDVD risk reduction.  4. Class 1 obesity with serious comorbidity and body mass index (BMI) of 30.0 to 30.9 in adult, unspecified obesity type I will go ahead and prescribe the advanced doses of the regimen in order to avoid delay in future prescriptions since there is a supply/demand issue with this medication currently.   - Semaglutide-Weight Management (WEGOVY) 0.5 MG/0.5ML SOAJ; Inject 0.5 mLs (0.5 mg total) into the skin once a week.  Dispense: 2 mL; Refill: 0 - Semaglutide-Weight Management (WEGOVY) 1 MG/0.5ML SOAJ; Inject 0.5 mLs (1 mg total) into the skin once a week.  Dispense: 2 mL; Refill: 0  Brooke [name] is currently in the action stage of change. As such, her goal is to continue with weight loss efforts. She has agreed to  keeping a food journal and adhering to recommended goals of 1000 calories and 85 grams of protein.   Exercise goals: Walking 2-3 times per week for 20 minutes.  Behavioral modification strategies: increasing lean protein intake and planning for success.  Brooke [name] has agreed to follow-up with our clinic in 3 weeks. She was informed of the importance of frequent follow-up visits to maximize her success with intensive lifestyle modifications for her multiple health conditions.   Objective:   Blood pressure 97/66, pulse 77, temperature 98.3 F (36.8 C), temperature source Oral, height 5\' 4"  (1.626 m), weight 173 lb (78.5 kg),  last menstrual period 04/03/2018, SpO2 98 %. Body mass index is 29.7 kg/m.  General: Cooperative, alert, well developed, in no acute distress. HEENT: Conjunctivae and lids unremarkable. Cardiovascular: Regular rhythm.  Lungs: Normal work of breathing. Neurologic: No focal deficits.   Lab Results  Component Value Date   CREATININE 0.83 12/13/2019   BUN 16 12/13/2019   NA 142 12/13/2019   K 4.4 12/13/2019   CL 109 (H) 12/13/2019   CO2 23 12/13/2019   Lab Results  Component Value Date   ALT 29 12/13/2019   AST 22 12/13/2019   ALKPHOS 130 (H) 12/13/2019   BILITOT <0.2 12/13/2019   Lab Results  Component Value Date   HGBA1C 5.5 12/13/2019   HGBA1C 5.5 03/05/2013   Lab Results  Component Value Date   INSULIN 12.0 12/13/2019   Lab Results  Component Value Date   TSH 3.960 12/13/2019   Lab Results  Component Value Date   CHOL 216 (H) 12/13/2019   HDL 38 (L) 12/13/2019   LDLCALC 148 (H) 12/13/2019   LDLDIRECT 130.6 09/05/2009   TRIG 165 (H) 12/13/2019   CHOLHDL 4.5 11/24/2018   Lab Results  Component Value Date   WBC 4.6 12/13/2019   HGB 14.7 12/13/2019   HCT 44.2 12/13/2019   MCV 92 12/13/2019   PLT 218 12/13/2019   Lab Results  Component Value Date   IRON 83 12/13/2019   TIBC 258 12/13/2019   FERRITIN 70 12/13/2019   Attestation Statements:   Reviewed by clinician on day of visit: allergies, medications, problem list, medical history, surgical history, family history, social history, and previous encounter notes.  I, Water quality scientist, CMA, am acting as transcriptionist for Briscoe Deutscher, DO  I have reviewed the above documentation for accuracy and completeness, and I agree with the above. Briscoe Deutscher, DO

## 2020-01-09 SURGERY — Surgical Case
Anesthesia: *Unknown

## 2020-01-29 ENCOUNTER — Other Ambulatory Visit: Payer: Self-pay

## 2020-01-29 ENCOUNTER — Encounter (INDEPENDENT_AMBULATORY_CARE_PROVIDER_SITE_OTHER): Payer: Self-pay | Admitting: Family Medicine

## 2020-01-29 ENCOUNTER — Ambulatory Visit (INDEPENDENT_AMBULATORY_CARE_PROVIDER_SITE_OTHER): Payer: No Typology Code available for payment source | Admitting: Family Medicine

## 2020-01-29 VITALS — BP 89/60 | HR 98 | Temp 98.0°F | Ht 64.0 in | Wt 169.0 lb

## 2020-01-29 DIAGNOSIS — E559 Vitamin D deficiency, unspecified: Secondary | ICD-10-CM

## 2020-01-29 DIAGNOSIS — E782 Mixed hyperlipidemia: Secondary | ICD-10-CM | POA: Diagnosis not present

## 2020-01-29 DIAGNOSIS — R632 Polyphagia: Secondary | ICD-10-CM | POA: Diagnosis not present

## 2020-01-29 DIAGNOSIS — E8881 Metabolic syndrome: Secondary | ICD-10-CM | POA: Diagnosis not present

## 2020-01-29 DIAGNOSIS — Z9189 Other specified personal risk factors, not elsewhere classified: Secondary | ICD-10-CM

## 2020-01-29 DIAGNOSIS — E663 Overweight: Secondary | ICD-10-CM

## 2020-01-29 DIAGNOSIS — Z6829 Body mass index (BMI) 29.0-29.9, adult: Secondary | ICD-10-CM

## 2020-01-29 NOTE — Progress Notes (Signed)
Chief Complaint:   OBESITY Brooke [name] is here to discuss her progress with her obesity treatment plan along with follow-up of her obesity related diagnoses. Brooke [name] is on the Category 1 Plan and states she is following her eating plan approximately 99% of the time. Brooke [name] states she is increasing her activity level on the beach.  Today's visit was #: 3 Starting weight: 185 lbs Starting date: 12/13/2019 Today's weight: 169.4 lbs Today's date: 01/29/2020 Total lbs lost to date: 16 lbs Total lbs lost since last in-office visit: 4 lbs Total weight loss percentage to date: -8.65%  Interim History: Brooke [name] recently got back from the beach.  She was there for 2 weeks.  Other than one breakfast, during which time she enjoyed pumpkin pancakes, she felt that she was on plan nearly 100% of the time.  She did get the walk on the beach and play with her grandkids.  She is tolerating the [facility] and finds that it is helpful for her polyphasia.  She admits that she is still not able to get enough water throughout the day.  She will continue working on that.  Now that she is home, she wants to focus on daily walks.  We will advance her medication as tolerated.  Assessment/Plan:   1. Polyphagia Improved with VPXTGG. Continue at current dose for now.   2. Mixed hyperlipidemia Lab Results  Component Value Date   CHOL 216 (H) 12/13/2019   HDL 38 (L) 12/13/2019   LDLCALC 148 (H) 12/13/2019   LDLDIRECT 130.6 09/05/2009   TRIG 165 (H) 12/13/2019   CHOLHDL 4.5 11/24/2018   Lab Results  Component Value Date   ALT 29 12/13/2019   AST 22 12/13/2019   ALKPHOS 130 (H) 12/13/2019   BILITOT <0.2 12/13/2019   The ASCVD Risk score [name] DC Jr., et al., 2013) failed to calculate for the following reasons:   The valid systolic blood pressure range is 90 to 200 mmHg  Course: Not at goal. Target levels for LDL are: < 100. Plan: Dietary changes: Increase soluble fiber. Decrease simple carbohydrates.  Exercise changes: An average 40 minutes of moderate to vigorous-intensity aerobic activity 3 or 4 times per week. Lipid-lowering medications: None. We will recheck labs in 3 months or 10% weight loss.   3. Vitamin D deficiency Current vitamin D is 19.0, tested on 12/13/2019. Not at goal. Optimal goal > 50 ng/dL. There is also evidence to support a goal of >70 ng/dL in patients with cancer and heart disease. Plan: Continue Vitamin D @50 ,000 IU every week with follow-up for routine testing of Vitamin D at least 2-3 times per year to avoid over-replacement.  4. Metabolic syndrome Improving. Goal: Lose 7-10% of starting weight. We discussed several lifestyle modifications today and she will continue to work on diet, exercise and weight loss efforts.   5. At risk for complication associated with hypotension [name] was given approximately 15 minutes of education and counseling today to help avoid hypotension. We discussed risks of hypotension with weight loss and signs of hypotension such as feeling lightheaded or unsteady.  6. Overweight with body mass index (BMI) of 29 to 29.9 in adult  Brooke [name] is currently in the action stage of change. As such, her goal is to continue with weight loss efforts. She has agreed to keeping a food journal and adhering to recommended goals of 1000 calories and 85 grams of protein.   Exercise goals: Walking 2-3 times per week for 20 minutes.  Behavioral modification  strategies: increasing lean protein intake and planning for success.  Brooke [name] has agreed to follow-up with our clinic in 3 weeks. She was informed of the importance of frequent follow-up visits to maximize her success with intensive lifestyle modifications for her multiple health conditions.   Objective:   Blood pressure (!) 89/60, pulse 98, temperature 98 F (36.7 C), temperature source Oral, height 5\' 4"  (1.626 m), weight 169 lb (76.7 kg), last menstrual period 04/03/2018, SpO2 97 %. Body mass  index is 29.01 kg/m.  General: Cooperative, alert, well developed, in no acute distress. HEENT: Conjunctivae and lids unremarkable. Cardiovascular: Regular rhythm.  Lungs: Normal work of breathing. Neurologic: No focal deficits.   Lab Results  Component Value Date   CREATININE 0.83 12/13/2019   BUN 16 12/13/2019   NA 142 12/13/2019   K 4.4 12/13/2019   CL 109 (H) 12/13/2019   CO2 23 12/13/2019   Lab Results  Component Value Date   ALT 29 12/13/2019   AST 22 12/13/2019   ALKPHOS 130 (H) 12/13/2019   BILITOT <0.2 12/13/2019   Lab Results  Component Value Date   HGBA1C 5.5 12/13/2019   HGBA1C 5.5 03/05/2013   Lab Results  Component Value Date   INSULIN 12.0 12/13/2019   Lab Results  Component Value Date   TSH 3.960 12/13/2019   Lab Results  Component Value Date   CHOL 216 (H) 12/13/2019   HDL 38 (L) 12/13/2019   LDLCALC 148 (H) 12/13/2019   LDLDIRECT 130.6 09/05/2009   TRIG 165 (H) 12/13/2019   CHOLHDL 4.5 11/24/2018   Lab Results  Component Value Date   WBC 4.6 12/13/2019   HGB 14.7 12/13/2019   HCT 44.2 12/13/2019   MCV 92 12/13/2019   PLT 218 12/13/2019   Lab Results  Component Value Date   IRON 83 12/13/2019   TIBC 258 12/13/2019   FERRITIN 70 12/13/2019   Attestation Statements:   Reviewed by clinician on day of visit: allergies, medications, problem list, medical history, surgical history, family history, social history, and previous encounter notes.  I, Water quality scientist, CMA, am acting as transcriptionist for Briscoe Deutscher, DO  I have reviewed the above documentation for accuracy and completeness, and I agree with the above. Briscoe Deutscher, DO

## 2020-01-30 MED FILL — WEGOVY 0.5 MG/0.5ML SOAJ: 0.5 | 28 days supply | Qty: 2 | Fill #0

## 2020-02-07 ENCOUNTER — Ambulatory Visit: Payer: No Typology Code available for payment source | Attending: Internal Medicine

## 2020-02-07 DIAGNOSIS — Z23 Encounter for immunization: Secondary | ICD-10-CM

## 2020-02-07 NOTE — Progress Notes (Signed)
° °  Covid-19 Vaccination Clinic  Name:  Brooke [name]    MRN: [id number] DOB: 26-Sep-1970  02/07/2020  [name] was observed post Covid-19 immunization for 15 minutes without incident. She was provided with Vaccine Information Sheet and instruction to access the V-Safe system.   [name] was instructed to call 911 with any severe reactions post vaccine:  Difficulty breathing   Swelling of face and throat   A fast heartbeat   A bad rash all over body   Dizziness and weakness

## 2020-02-19 ENCOUNTER — Encounter (INDEPENDENT_AMBULATORY_CARE_PROVIDER_SITE_OTHER): Payer: Self-pay | Admitting: Family Medicine

## 2020-02-19 ENCOUNTER — Other Ambulatory Visit: Payer: Self-pay

## 2020-02-19 ENCOUNTER — Ambulatory Visit: Payer: No Typology Code available for payment source | Admitting: Psychiatry

## 2020-02-19 ENCOUNTER — Ambulatory Visit (INDEPENDENT_AMBULATORY_CARE_PROVIDER_SITE_OTHER): Payer: No Typology Code available for payment source | Admitting: Family Medicine

## 2020-02-19 VITALS — BP 112/63 | HR 89 | Temp 98.1°F | Ht 64.0 in | Wt 162.0 lb

## 2020-02-19 DIAGNOSIS — E669 Obesity, unspecified: Secondary | ICD-10-CM | POA: Diagnosis not present

## 2020-02-19 DIAGNOSIS — R632 Polyphagia: Secondary | ICD-10-CM | POA: Diagnosis not present

## 2020-02-19 DIAGNOSIS — Z6830 Body mass index (BMI) 30.0-30.9, adult: Secondary | ICD-10-CM

## 2020-02-19 DIAGNOSIS — E559 Vitamin D deficiency, unspecified: Secondary | ICD-10-CM

## 2020-02-19 DIAGNOSIS — F411 Generalized anxiety disorder: Secondary | ICD-10-CM

## 2020-02-20 ENCOUNTER — Other Ambulatory Visit (INDEPENDENT_AMBULATORY_CARE_PROVIDER_SITE_OTHER): Payer: Self-pay | Admitting: Family Medicine

## 2020-02-20 MED ORDER — WEGOVY 1 MG/0.5ML ~~LOC~~ SOAJ: 1.0000 mg | SUBCUTANEOUS | 0 refills | Status: DC

## 2020-02-20 MED FILL — WEGOVY 1 MG/0.5ML SOAJ: 1 | 28 days supply | Qty: 2 | Fill #0

## 2020-02-20 NOTE — Progress Notes (Signed)
Chief Complaint:   OBESITY Brooke [name] is here to discuss her progress with her obesity treatment plan along with follow-up of her obesity related diagnoses.   Today's visit was #: 4 Starting weight: 185 lbs Starting date: 12/13/2019 Today's weight: 162 lbs Today's date: 02/19/2020 Total lbs lost to date: 23 lbs Body mass index is 27.81 kg/m.  Total weight loss percentage to date: -12.43%  Interim History: Doing well with meal planning and movement.  She will be going to the beach again before our next visit and is looking forward to having pumpkin pancakes again.  Plan:  Will refill Wegovy today. We discussed the importance of hydration with water and electrolytes. We also reviewed a bowel regimen.  Nutrition Plan: Journaling 1000 calories per day and 85 grams of protein 99% of the time. Anti-obesity medications: Wegovy 1 mg subcutaneously weekly. Reported side effects: Mild leg cramps and constipation. Hunger is well controlled controlled. Cravings are well controlled controlled.  Activity: Walking for 30 minutes 1 time per week. Sleep: Sleep is restful.  Stress: Work, but manageable.  Assessment/Plan:   1. Vitamin D deficiency Current vitamin D is 19.0, tested on 12/13/2019. Not at goal. Optimal goal > 50 ng/dL.   Plan: Continue Vitamin D @50 ,000 IU every week with follow-up for routine testing of Vitamin D at least 2-3 times per year to avoid over-replacement.  2. Polyphagia Controlled.  Hyperphagia, also called polyphagia, refers to excessive feelings of hunger, which are not relieved by eating. This is more likely to be an issues for people that have diabetes, prediabetes, or insulin resistance.   Plan:  She will continue to focus on protein-rich, low simple carbohydrate foods. We reviewed the importance of hydration, regular exercise for stress reduction, and restorative sleep.  3. GAD (generalized anxiety disorder) Stable.  Behavior modification techniques were  discussed today to help Brooke [name] deal with her anxiety.  Orders and follow up as documented in patient record.   4. Class 1 obesity with serious comorbidity and body mass index (BMI) of 30.0 to 30.9 in adult, unspecified obesity type  - Refill Semaglutide-Weight Management (WEGOVY) 1 MG/0.5ML SOAJ; Inject 1 mg into the skin once a week.  Dispense: 2 mL; Refill: 0  Brooke [name] is currently in the action stage of change. As such, her goal is to continue with weight loss efforts.   Nutrition goals: She has agreed to keeping a food journal and adhering to recommended goals of 1000 calories and 85 grams of protein.   Exercise goals: For substantial health benefits, adults should do at least 150 minutes (2 hours and 30 minutes) a week of moderate-intensity, or 75 minutes (1 hour and 15 minutes) a week of vigorous-intensity aerobic physical activity, or an equivalent combination of moderate- and vigorous-intensity aerobic activity. Aerobic activity should be performed in episodes of at least 10 minutes, and preferably, it should be spread throughout the week.  Behavioral modification strategies: increasing lean protein intake, decreasing simple carbohydrates, increasing vegetables and increasing water intake.  Brooke [name] has agreed to follow-up with our clinic in 3 weeks. She was informed of the importance of frequent follow-up visits to maximize her success with intensive lifestyle modifications for her multiple health conditions.   Objective:   Blood pressure 112/63, pulse 89, temperature 98.1 F (36.7 C), temperature source Oral, height 5\' 4"  (1.626 m), weight 162 lb (73.5 kg), last menstrual period 04/03/2018, SpO2 100 %. Body mass index is 27.81 kg/m.  General: Cooperative, alert, well developed, in no  acute distress. HEENT: Conjunctivae and lids unremarkable. Cardiovascular: Regular rhythm.  Lungs: Normal work of breathing. Neurologic: No focal deficits.   Lab Results  Component Value Date    CREATININE 0.83 12/13/2019   BUN 16 12/13/2019   NA 142 12/13/2019   K 4.4 12/13/2019   CL 109 (H) 12/13/2019   CO2 23 12/13/2019   Lab Results  Component Value Date   ALT 29 12/13/2019   AST 22 12/13/2019   ALKPHOS 130 (H) 12/13/2019   BILITOT <0.2 12/13/2019   Lab Results  Component Value Date   HGBA1C 5.5 12/13/2019   HGBA1C 5.5 03/05/2013   Lab Results  Component Value Date   INSULIN 12.0 12/13/2019   Lab Results  Component Value Date   TSH 3.960 12/13/2019   Lab Results  Component Value Date   CHOL 216 (H) 12/13/2019   HDL 38 (L) 12/13/2019   LDLCALC 148 (H) 12/13/2019   LDLDIRECT 130.6 09/05/2009   TRIG 165 (H) 12/13/2019   CHOLHDL 4.5 11/24/2018   Lab Results  Component Value Date   WBC 4.6 12/13/2019   HGB 14.7 12/13/2019   HCT 44.2 12/13/2019   MCV 92 12/13/2019   PLT 218 12/13/2019   Lab Results  Component Value Date   IRON 83 12/13/2019   TIBC 258 12/13/2019   FERRITIN 70 12/13/2019   Attestation Statements:   Reviewed by clinician on day of visit: allergies, medications, problem list, medical history, surgical history, family history, social history, and previous encounter notes.  I, Water quality scientist, CMA, am acting as transcriptionist for Briscoe Deutscher, DO  I have reviewed the above documentation for accuracy and completeness, and I agree with the above. Briscoe Deutscher, DO

## 2020-02-27 ENCOUNTER — Other Ambulatory Visit: Payer: Self-pay | Admitting: Psychiatry

## 2020-02-27 DIAGNOSIS — G4726 Circadian rhythm sleep disorder, shift work type: Secondary | ICD-10-CM

## 2020-02-27 DIAGNOSIS — F5105 Insomnia due to other mental disorder: Secondary | ICD-10-CM

## 2020-02-29 ENCOUNTER — Other Ambulatory Visit: Payer: Self-pay | Admitting: Psychiatry

## 2020-02-29 NOTE — Telephone Encounter (Signed)
Next appt is 04/10/20. Patient needs refill on Ambien sent to [facility]

## 2020-03-03 ENCOUNTER — Other Ambulatory Visit: Payer: Self-pay | Admitting: Psychiatry

## 2020-03-03 MED FILL — ZOLPIDEM TARTRATE 10 MG TAB: 10 | 90 days supply | Qty: 90 | Fill #0

## 2020-03-03 NOTE — Telephone Encounter (Signed)
Next apt 12/09 Pharmacy [facility]

## 2020-03-03 NOTE — Telephone Encounter (Signed)
Next apt 12/09

## 2020-03-11 ENCOUNTER — Encounter (INDEPENDENT_AMBULATORY_CARE_PROVIDER_SITE_OTHER): Payer: Self-pay | Admitting: Family Medicine

## 2020-03-11 ENCOUNTER — Ambulatory Visit (INDEPENDENT_AMBULATORY_CARE_PROVIDER_SITE_OTHER): Payer: No Typology Code available for payment source | Admitting: Family Medicine

## 2020-03-11 ENCOUNTER — Other Ambulatory Visit: Payer: Self-pay

## 2020-03-11 VITALS — BP 95/64 | HR 94 | Temp 98.0°F | Ht 64.0 in | Wt 156.0 lb

## 2020-03-11 DIAGNOSIS — E559 Vitamin D deficiency, unspecified: Secondary | ICD-10-CM | POA: Diagnosis not present

## 2020-03-11 DIAGNOSIS — R632 Polyphagia: Secondary | ICD-10-CM

## 2020-03-11 DIAGNOSIS — E782 Mixed hyperlipidemia: Secondary | ICD-10-CM | POA: Diagnosis not present

## 2020-03-11 DIAGNOSIS — E669 Obesity, unspecified: Secondary | ICD-10-CM | POA: Diagnosis not present

## 2020-03-11 DIAGNOSIS — Z6830 Body mass index (BMI) 30.0-30.9, adult: Secondary | ICD-10-CM

## 2020-03-12 ENCOUNTER — Other Ambulatory Visit (INDEPENDENT_AMBULATORY_CARE_PROVIDER_SITE_OTHER): Payer: Self-pay | Admitting: Family Medicine

## 2020-03-12 MED ORDER — WEGOVY 1 MG/0.5ML ~~LOC~~ SOAJ: 1.0000 mg | SUBCUTANEOUS | 0 refills | Status: DC

## 2020-03-13 NOTE — Progress Notes (Signed)
Chief Complaint:   OBESITY Brooke [name] is here to discuss her progress with her obesity treatment plan along with follow-up of her obesity related diagnoses.   Today's visit was #: 5 Starting weight: 185 lbs Starting date: 12/13/2019 Today's weight: 156 lbs Today's date: 03/11/2020 Total lbs lost to date: 29 lbs Body mass index is 26.78 kg/m.  Total weight loss percentage to date: -15.68%  Interim History: Brooke [name] is down 29 pounds today.  She needs a CPE and will be contacting her PCP about this soon.   Plan:  Will refill Wegovy today at 1 mg subcutaneously weekly.  Nutrition Plan: keeping a food journal and adhering to recommended goals of 1000 calories and 85 grams of protein for 80% of the time.  Anti-obesity medications: Wegovy. Reported side effects: None. Hunger is well controlled controlled. Cravings are well controlled controlled.  Activity: Walking for 30+ minutes 2-3 times per day. Sleep: Sleep is restful.   Assessment/Plan:   1. Mixed hyperlipidemia Course: Not at goal.  Lipid-lowering medications: None.   Plan: Dietary changes: Increase soluble fiber. Decrease simple carbohydrates. Exercise changes: An average 40 minutes of moderate to vigorous-intensity aerobic activity 3 or 4 times per week.  Will check labs at next visit.  Lab Results  Component Value Date   CHOL 216 (H) 12/13/2019   HDL 38 (L) 12/13/2019   LDLCALC 148 (H) 12/13/2019   LDLDIRECT 130.6 09/05/2009   TRIG 165 (H) 12/13/2019   CHOLHDL 4.5 11/24/2018   Lab Results  Component Value Date   ALT 29 12/13/2019   AST 22 12/13/2019   ALKPHOS 130 (H) 12/13/2019   BILITOT <0.2 12/13/2019   The 10-year ASCVD risk score Brooke [name] DC Jr., et al., 2013) is: 1.3%   Values used to calculate the score:     Age: 49 years     Sex: Female     Is Non-Hispanic African American: No     Diabetic: No     Tobacco smoker: No     Systolic Blood Pressure: 95 mmHg     Is BP treated: No     HDL Cholesterol: 38  mg/dL     Total Cholesterol: 216 mg/dL  2. Polyphagia Hyperphagia, also called polyphagia, refers to excessive feelings of hunger. This is more likely to be an issues for people that have diabetes, prediabetes, or insulin resistance. She will continue to focus on protein-rich, low simple carbohydrate foods. We reviewed the importance of hydration, regular exercise for stress reduction, and restorative sleep. Continue [id number].  3. Vitamin D deficiency Not at goal. Current vitamin D is 19.0, tested on 12/13/2019. Optimal goal > 50 ng/dL.   Plan:  [x]   Continue Vitamin D @50 ,000 IU every week. []   Continue home supplement daily. [x]   Follow-up for routine testing of Vitamin D at least 2-3 times per year to avoid over-replacement.  4. Class 1 obesity with serious comorbidity and body mass index (BMI) of 30.0 to 30.9 in adult, unspecified obesity type  - Refill Semaglutide-Weight Management (WEGOVY) 1 MG/0.5ML SOAJ; Inject 1 mg into the skin once a week.  Dispense: 2 mL; Refill: 0  Course: Brooke [name] is currently in the action stage of change. As such, her goal is to continue with weight loss efforts.   Nutrition goals: She has agreed to keeping a food journal and adhering to recommended goals of 1000 calories and 85 grams of protein.   Exercise goals: Band exercises.  Behavioral modification strategies: dealing with family or coworker  sabotage and travel eating strategies.  Brooke [name] has agreed to follow-up with our clinic in 3 weeks. She was informed of the importance of frequent follow-up visits to maximize her success with intensive lifestyle modifications for her multiple health conditions.   Objective:   Blood pressure 95/64, pulse 94, temperature 98 F (36.7 C), temperature source Oral, height 5\' 4"  (1.626 m), weight 156 lb (70.8 kg), last menstrual period 04/03/2018, SpO2 100 %. Body mass index is 26.78 kg/m.  General: Cooperative, alert, well developed, in no acute  distress. HEENT: Conjunctivae and lids unremarkable. Cardiovascular: Regular rhythm.  Lungs: Normal work of breathing. Neurologic: No focal deficits.   Lab Results  Component Value Date   CREATININE 0.83 12/13/2019   BUN 16 12/13/2019   NA 142 12/13/2019   K 4.4 12/13/2019   CL 109 (H) 12/13/2019   CO2 23 12/13/2019   Lab Results  Component Value Date   ALT 29 12/13/2019   AST 22 12/13/2019   ALKPHOS 130 (H) 12/13/2019   BILITOT <0.2 12/13/2019   Lab Results  Component Value Date   HGBA1C 5.5 12/13/2019   HGBA1C 5.5 03/05/2013   Lab Results  Component Value Date   INSULIN 12.0 12/13/2019   Lab Results  Component Value Date   TSH 3.960 12/13/2019   Lab Results  Component Value Date   CHOL 216 (H) 12/13/2019   HDL 38 (L) 12/13/2019   LDLCALC 148 (H) 12/13/2019   LDLDIRECT 130.6 09/05/2009   TRIG 165 (H) 12/13/2019   CHOLHDL 4.5 11/24/2018   Lab Results  Component Value Date   WBC 4.6 12/13/2019   HGB 14.7 12/13/2019   HCT 44.2 12/13/2019   MCV 92 12/13/2019   PLT 218 12/13/2019   Lab Results  Component Value Date   IRON 83 12/13/2019   TIBC 258 12/13/2019   FERRITIN 70 12/13/2019   Attestation Statements:   Reviewed by clinician on day of visit: allergies, medications, problem list, medical history, surgical history, family history, social history, and previous encounter notes.  I, Water quality scientist, CMA, am acting as transcriptionist for Briscoe Deutscher, DO  I have reviewed the above documentation for accuracy and completeness, and I agree with the above. Briscoe Deutscher, DO

## 2020-03-15 MED FILL — WEGOVY 1 MG/0.5ML SOAJ: 1 | 28 days supply | Qty: 2 | Fill #0

## 2020-03-15 MED FILL — ZOLPIDEM TARTRATE 10 MG TAB: 10 | 90 days supply | Qty: 90 | Fill #0

## 2020-03-20 ENCOUNTER — Other Ambulatory Visit: Payer: Self-pay | Admitting: Psychiatry

## 2020-03-20 DIAGNOSIS — F3342 Major depressive disorder, recurrent, in full remission: Secondary | ICD-10-CM

## 2020-03-20 DIAGNOSIS — F4001 Agoraphobia with panic disorder: Secondary | ICD-10-CM

## 2020-03-20 DIAGNOSIS — F411 Generalized anxiety disorder: Secondary | ICD-10-CM

## 2020-03-21 MED FILL — PARoxetine HCL 40 MG TABS: 40 | 90 days supply | Qty: 180 | Fill #0

## 2020-04-08 ENCOUNTER — Other Ambulatory Visit (INDEPENDENT_AMBULATORY_CARE_PROVIDER_SITE_OTHER): Payer: Self-pay | Admitting: Family Medicine

## 2020-04-08 ENCOUNTER — Other Ambulatory Visit: Payer: Self-pay

## 2020-04-08 ENCOUNTER — Ambulatory Visit (INDEPENDENT_AMBULATORY_CARE_PROVIDER_SITE_OTHER): Payer: No Typology Code available for payment source | Admitting: Family Medicine

## 2020-04-08 ENCOUNTER — Encounter (INDEPENDENT_AMBULATORY_CARE_PROVIDER_SITE_OTHER): Payer: Self-pay | Admitting: Family Medicine

## 2020-04-08 VITALS — BP 91/63 | HR 91 | Temp 98.2°F | Ht 64.0 in | Wt 152.0 lb

## 2020-04-08 DIAGNOSIS — R632 Polyphagia: Secondary | ICD-10-CM | POA: Diagnosis not present

## 2020-04-08 DIAGNOSIS — Z9189 Other specified personal risk factors, not elsewhere classified: Secondary | ICD-10-CM

## 2020-04-08 DIAGNOSIS — Z6830 Body mass index (BMI) 30.0-30.9, adult: Secondary | ICD-10-CM

## 2020-04-08 DIAGNOSIS — E782 Mixed hyperlipidemia: Secondary | ICD-10-CM | POA: Diagnosis not present

## 2020-04-08 DIAGNOSIS — E559 Vitamin D deficiency, unspecified: Secondary | ICD-10-CM

## 2020-04-08 DIAGNOSIS — E669 Obesity, unspecified: Secondary | ICD-10-CM | POA: Diagnosis not present

## 2020-04-08 MED ORDER — WEGOVY 1.7 MG/0.75ML ~~LOC~~ SOAJ: 1.7000 mg | SUBCUTANEOUS | 0 refills | Status: DC

## 2020-04-08 MED FILL — WEGOVY 1.7 MG/0.75ML SOAJ: 1.7 | 28 days supply | Qty: 3 | Fill #0

## 2020-04-09 LAB — COMPREHENSIVE METABOLIC PANEL
ALT: 17 IU/L (ref 0–32)
AST: 17 IU/L (ref 0–40)
Albumin/Globulin Ratio: 2.5 — ABNORMAL HIGH (ref 1.2–2.2)
Albumin: 4.5 g/dL (ref 3.8–4.9)
Alkaline Phosphatase: 107 IU/L (ref 44–121)
BUN/Creatinine Ratio: 13 (ref 9–23)
BUN: 11 mg/dL (ref 6–24)
Bilirubin Total: 0.3 mg/dL (ref 0.0–1.2)
CO2: 20 mmol/L (ref 20–29)
Calcium: 9.3 mg/dL (ref 8.7–10.2)
Chloride: 109 mmol/L — ABNORMAL HIGH (ref 96–106)
Creatinine, Ser: 0.87 mg/dL (ref 0.57–1.00)
GFR calc Af Amer: 89 mL/min/{1.73_m2} (ref >59)
GFR calc non Af Amer: 77 mL/min/{1.73_m2} (ref >59)
Globulin, Total: 1.8 g/dL (ref 1.5–4.5)
Glucose: 81 mg/dL (ref 65–99)
Potassium: 4.2 mmol/L (ref 3.5–5.2)
Sodium: 142 mmol/L (ref 134–144)
Total Protein: 6.3 g/dL (ref 6.0–8.5)

## 2020-04-09 LAB — LIPID PANEL
Chol/HDL Ratio: 4.5 ratio — ABNORMAL HIGH (ref 0.0–4.4)
Cholesterol, Total: 230 mg/dL — ABNORMAL HIGH (ref 100–199)
HDL: 51 mg/dL (ref >39)
LDL Chol Calc (NIH): 163 mg/dL — ABNORMAL HIGH (ref 0–99)
Triglycerides: 90 mg/dL (ref 0–149)
VLDL Cholesterol Cal: 16 mg/dL (ref 5–40)

## 2020-04-09 LAB — VITAMIN D 25 HYDROXY (VIT D DEFICIENCY, FRACTURES): Vit D, 25-Hydroxy: 28.5 ng/mL — ABNORMAL LOW (ref 30.0–100.0)

## 2020-04-09 NOTE — Progress Notes (Signed)
Chief Complaint:   OBESITY Brooke [name] is here to discuss her progress with her obesity treatment plan along with follow-up of her obesity related diagnoses.   Today's visit was #: 6 Starting weight: 185 lbs Starting date: 12/13/2019 Today's weight: 152 lbs Today's date: 04/08/2020 Total lbs lost to date: 33 lbs Body mass index is 26.09 kg/m.  Total weight loss percentage to date: -17.84%  Interim History: Brooke [name] is down 33 pounds today. Her BMI is nearing 25.  Her goal weight is 130 pounds.  Her renegotiated goal is around 140 pounds as long as her muscle mass is stable or increasing. Nutrition Plan: keeping a food journal and adhering to recommended goals of 1000 calories and 85 grams of protein for 95% of the time.  Anti-obesity medications: Wegovy. Activity: Walking for 30 minutes 1-3 times per week.  Assessment/Plan:   1. Polyphagia Controlled. She will continue to focus on protein-rich, low simple carbohydrate foods. We reviewed the importance of hydration, regular exercise for stress reduction, and restorative sleep.  - Refill Semaglutide-Weight Management (WEGOVY) 1.7 MG/0.75ML SOAJ; Inject 1.7 mg into the skin once a week.  Dispense: 3 mL; Refill: 0  2. Vitamin D deficiency Not at goal. Current vitamin D is 19.0, tested on 12/13/2019. Optimal goal > 50 ng/dL.   Plan:  []   Continue Vitamin D @50 ,000 IU every week. []   Continue home supplement daily. [x]   Follow-up for routine testing of Vitamin D at least 2-3 times per year to avoid over-replacement.  - VITAMIN D 25 Hydroxy (Vit-D Deficiency, Fractures)  3. Mixed hyperlipidemia Lipid-lowering medications: None.   Plan: Dietary changes: Increase soluble fiber. Decrease simple carbohydrates. Exercise changes: An average 40 minutes of moderate to vigorous-intensity aerobic activity 3 or 4 times per week.   Lab Results  Component Value Date   CHOL 230 (H) 04/08/2020   HDL 51 04/08/2020   LDLCALC 163 (H)  04/08/2020   LDLDIRECT 130.6 09/05/2009   TRIG 90 04/08/2020   CHOLHDL 4.5 (H) 04/08/2020   Lab Results  Component Value Date   ALT 17 04/08/2020   AST 17 04/08/2020   ALKPHOS 107 04/08/2020   BILITOT 0.3 04/08/2020   The 10-year ASCVD risk score [name] DC Jr., et al., 2013) is: 0.9%   Values used to calculate the score:     Age: 49 years     Sex: Female     Is Non-Hispanic African American: No     Diabetic: No     Tobacco smoker: No     Systolic Blood Pressure: 91 mmHg     Is BP treated: No     HDL Cholesterol: 51 mg/dL     Total Cholesterol: 230 mg/dL  - Comprehensive metabolic panel - Lipid panel  4. At risk for deficient intake of food Zola was given approximately 9 minutes of deficit intake of food prevention counseling today. Frida is at risk for eating too few calories due to increasing her dose of Wegovy.  She was encouraged to focus on meeting caloric and protein goals according to her recommended meal plan.   5. Class 1 obesity with serious comorbidity and body mass index (BMI) of 30.0 to 30.9 in adult, unspecified obesity type  Course: Brooke [name] is currently in the action stage of change. As such, her goal is to continue with weight loss efforts.   Nutrition goals: She has agreed to keeping a food journal and adhering to recommended goals of 1000 calories and 85 grams of  protein.   Exercise goals: For substantial health benefits, adults should do at least 150 minutes (2 hours and 30 minutes) a week of moderate-intensity, or 75 minutes (1 hour and 15 minutes) a week of vigorous-intensity aerobic physical activity, or an equivalent combination of moderate- and vigorous-intensity aerobic activity. Aerobic activity should be performed in episodes of at least 10 minutes, and preferably, it should be spread throughout the week.  Behavioral modification strategies: increasing lean protein intake, decreasing simple carbohydrates and increasing vegetables.  [name]  has agreed to follow-up with our clinic in 4 weeks. She was informed of the importance of frequent follow-up visits to maximize her success with intensive lifestyle modifications for her multiple health conditions.   Objective:   Blood pressure 91/63, pulse 91, temperature 98.2 F (36.8 C), temperature source Oral, height 5\' 4"  (1.626 m), weight 152 lb (68.9 kg), last menstrual period 04/03/2018, SpO2 99 %. Body mass index is 26.09 kg/m.  General: Cooperative, alert, well developed, in no acute distress. HEENT: Conjunctivae and lids unremarkable. Cardiovascular: Regular rhythm.  Lungs: Normal work of breathing. Neurologic: No focal deficits.   Lab Results  Component Value Date   CREATININE 0.87 04/08/2020   BUN 11 04/08/2020   NA 142 04/08/2020   K 4.2 04/08/2020   CL 109 (H) 04/08/2020   CO2 20 04/08/2020   Lab Results  Component Value Date   ALT 17 04/08/2020   AST 17 04/08/2020   ALKPHOS 107 04/08/2020   BILITOT 0.3 04/08/2020   Lab Results  Component Value Date   HGBA1C 5.5 12/13/2019   HGBA1C 5.5 03/05/2013   Lab Results  Component Value Date   INSULIN 12.0 12/13/2019   Lab Results  Component Value Date   TSH 3.960 12/13/2019   Lab Results  Component Value Date   CHOL 230 (H) 04/08/2020   HDL 51 04/08/2020   LDLCALC 163 (H) 04/08/2020   LDLDIRECT 130.6 09/05/2009   TRIG 90 04/08/2020   CHOLHDL 4.5 (H) 04/08/2020   Lab Results  Component Value Date   WBC 4.6 12/13/2019   HGB 14.7 12/13/2019   HCT 44.2 12/13/2019   MCV 92 12/13/2019   PLT 218 12/13/2019   Lab Results  Component Value Date   IRON 83 12/13/2019   TIBC 258 12/13/2019   FERRITIN 70 12/13/2019   Attestation Statements:   Reviewed by clinician on day of visit: allergies, medications, problem list, medical history, surgical history, family history, social history, and previous encounter notes.  I, Water quality scientist, CMA, am acting as transcriptionist for Briscoe Deutscher, DO  I have  reviewed the above documentation for accuracy and completeness, and I agree with the above. Briscoe Deutscher, DO

## 2020-04-10 ENCOUNTER — Encounter: Payer: Self-pay | Admitting: Psychiatry

## 2020-04-10 ENCOUNTER — Ambulatory Visit (INDEPENDENT_AMBULATORY_CARE_PROVIDER_SITE_OTHER): Payer: No Typology Code available for payment source | Admitting: Psychiatry

## 2020-04-10 ENCOUNTER — Other Ambulatory Visit: Payer: Self-pay | Admitting: Psychiatry

## 2020-04-10 ENCOUNTER — Other Ambulatory Visit: Payer: Self-pay

## 2020-04-10 DIAGNOSIS — F4001 Agoraphobia with panic disorder: Secondary | ICD-10-CM | POA: Diagnosis not present

## 2020-04-10 DIAGNOSIS — F411 Generalized anxiety disorder: Secondary | ICD-10-CM | POA: Diagnosis not present

## 2020-04-10 DIAGNOSIS — F5105 Insomnia due to other mental disorder: Secondary | ICD-10-CM | POA: Diagnosis not present

## 2020-04-10 DIAGNOSIS — G4726 Circadian rhythm sleep disorder, shift work type: Secondary | ICD-10-CM

## 2020-04-10 DIAGNOSIS — F3342 Major depressive disorder, recurrent, in full remission: Secondary | ICD-10-CM

## 2020-04-10 MED ORDER — ARIPIPRAZOLE 10 MG PO TABS: 10.0000 mg | ORAL_TABLET | Freq: Every day | ORAL | 1 refills | Status: DC

## 2020-04-10 MED ORDER — ZOLPIDEM TARTRATE 10 MG PO TABS: 10.0000 mg | ORAL_TABLET | Freq: Every evening | ORAL | 1 refills | Status: DC | PRN

## 2020-04-10 MED ORDER — TRAZODONE HCL 50 MG PO TABS: 100.0000 mg | ORAL_TABLET | Freq: Every day | ORAL | 1 refills | Status: DC

## 2020-04-10 MED ORDER — TOPIRAMATE 50 MG PO TABS: 100.0000 mg | ORAL_TABLET | Freq: Two times a day (BID) | ORAL | 1 refills | Status: DC

## 2020-04-10 MED ORDER — ALPRAZOLAM ER 0.5 MG PO TB24: ORAL_TABLET | ORAL | 0 refills | Status: DC

## 2020-04-10 MED ORDER — PAROXETINE HCL 40 MG PO TABS: 80.0000 mg | ORAL_TABLET | Freq: Every day | ORAL | 0 refills | Status: DC

## 2020-04-10 MED FILL — ALPRAZolam XR 0.5 MG TB24: 0.5 | 90 days supply | Qty: 270 | Fill #0

## 2020-04-10 MED FILL — traZODone HCL 50 MG TABS: 50 | 90 days supply | Qty: 180 | Fill #0

## 2020-04-10 MED FILL — TOPIRAMATE 50 MG TABLET: 50 | 90 days supply | Qty: 360 | Fill #0

## 2020-04-10 MED FILL — ARIPIPRAZOLE 10 MG TABS: 10 | 90 days supply | Qty: 90 | Fill #0

## 2020-04-10 NOTE — Progress Notes (Signed)
Brooke [name] [id number] 02-12-1971 49 y.o.  Subjective:   Patient ID:  Brooke [name] is a 49 y.o. (DOB [birth year]) female.  Chief Complaint:  Chief Complaint  Patient presents with  . Follow-up  . Depression  . Anxiety  . Sleeping Problem    HPI [name] presents to the office today for follow-up of anxiety and insomnia  seen December 2019.  For anxiety and weight gain topiramate was increased off label to 200 mg daily.  For anxiety propranolol 20 to 40 mg twice daily was added as needed.    Last seen November 2020: No meds were changed.  The following was noted: OK except for life events.  Still a lot of worry. Managed normally.  H agrees also.  Son's wife pregnant with another man's baby.  Real busy with 60 hours weekly.  Can't take Ambien at times bc of that.  Realizes she needs to take better self care.   Marland Kitchen August 23, 2019 appointment, the following is noted: Still doing well with meds.  Cont intermittent problems with work affecting sleep patterns and which sleep meds she can take.  Satisfied with meds.  No panic.  depression and anxiety manageable not gone.  Panic worse if overworked DT short staffed.  Patient reports stable mood and denies depressed or irritable moods.  Patient reports occasional difficulty with anxiety.  Patient reports occasionally difficulty with sleep initiation or maintenance. Denies appetite disturbance.  Patient reports that energy and motivation have been good.  Patient denies any difficulty with concentration.  Patient denies any suicidal ideation.  04/10/20 appt with following noted: Lost 33# at [facility] on 1000 calorie diet and Wegovy. A little extra anxiety lately without any specific reason for 2-3 mos.  No panic.  Mostly worry  Sx without avoidance.  Able to enjoy things normally.  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   Patient reports  stable mood and denies depressed or irritable moods.  Patient denies difficulty with sleep initiation or maintenance. Denies appetite disturbance.  Patient reports that energy and motivation have been good.  Patient denies any difficulty with concentration.  Patient denies any suicidal ideation.  Past Psychiatric Medication Trials: Citalopram, fluoxetine, fluvoxamine, and Anafranil, paroxetine 80, duloxetine Abilify 5 Buspirone Topiramate Ambien, Ambien CR, trazodone  Review of Systems:  Review of Systems  Cardiovascular: Negative for palpitations.  Musculoskeletal: Positive for arthralgias.  Neurological: Negative for dizziness, tremors and weakness.  Psychiatric/Behavioral: Positive for sleep disturbance. Negative for agitation, behavioral problems, confusion, decreased concentration, dysphoric mood, hallucinations, self-injury and suicidal ideas. The patient is nervous/anxious. The patient is not hyperactive.     Medications: I have reviewed the patient's current medications.  Current Outpatient Medications  Medication Sig Dispense Refill  . ibuprofen (ADVIL) 200 MG tablet Take 800 mg by mouth every 6 (six) hours as needed.    . propranolol (INDERAL) 10 MG tablet Take 5 mg by mouth daily as needed.    . Semaglutide-Weight Management (WEGOVY) 1.7 MG/0.75ML SOAJ Inject 1.7 mg into the skin once a week. 3 mL 0  . ALPRAZolam (XANAX XR) 0.5 MG 24 hr tablet 1 in am if needed for anxiety and 2 at night for anxiety and sleep 270 tablet 0  . ARIPiprazole (ABILIFY) 10 MG tablet Take 1 tablet (10 mg total) by mouth daily. 90 tablet 1  . PARoxetine (PAXIL) 40 MG tablet Take 2  tablets (80 mg total) by mouth at bedtime. 180 tablet 0  . topiramate (TOPAMAX) 50 MG tablet Take 2 tablets (100 mg total) by mouth 2 (two) times daily. 360 tablet 1  . traZODone (DESYREL) 50 MG tablet Take 2 tablets (100 mg total) by mouth at bedtime. 180 tablet 1  . zolpidem (AMBIEN) 10 MG tablet Take 1 tablet (10 mg total)  by mouth at bedtime as needed for sleep. 90 tablet 1   No current facility-administered medications for this visit.    Medication Side Effects: None  Allergies: No Known Allergies  Past Medical History:  Diagnosis Date  . Anxiety   . Bursitis   . Constipation   . Depression   . Fatty liver    seen on [place] 04-2018  . HLD (hyperlipidemia)   . Hyperglycemia 03/05/2013  . Incontinence   . Lower extremity edema   . Lumbar stress fracture   . Osteoporosis   . SOB (shortness of breath)     Family History  Problem Relation Age of Onset  . Thyroid disease Mother   . Hyperlipidemia Mother   . Diabetes Mother   . Hypertension Mother   . Diabetes Father   . Hypertension Father   . Hyperlipidemia Father   . Diabetes Paternal Grandmother   . Hypertension Paternal Grandmother   . Hyperlipidemia Paternal Grandmother   . Mental illness Sister   . Hypertension Paternal Uncle   . Diabetes Paternal Aunt     Social History   Socioeconomic History  . Marital status: Married    Spouse name: Not on file  . Number of children: Not on file  . Years of education: 2  . Highest education level: Not on file  Occupational History  . Occupation: RN/OR    Employer: [employer]  Tobacco Use  . Smoking status: Never Smoker  . Smokeless tobacco: Never Used  Vaping Use  . Vaping Use: Never used  Substance and Sexual Activity  . Alcohol use: No    Alcohol/week: 0.0 standard drinks  . Drug use: No  . Sexual activity: Yes    Partners: Male    Birth control/protection: Surgical    Comment: Husband's vasectomy  Other Topics Concern  . Not on file  Social History Narrative  . Not on file   Social Determinants of Health   Financial Resource Strain: Not on file  Food Insecurity: Not on file  Transportation Needs: Not on file  Physical Activity: Not on file  Stress: Not on file  Social Connections: Not on file  Intimate Partner Violence: Not on file    Past Medical History,  Surgical history, Social history, and Family history were reviewed and updated as appropriate.   Please see review of systems for further details on the patient's review from today.   Objective:   Physical Exam:  LMP 04/03/2018   Physical Exam Constitutional:      General: She is not in acute distress.    Appearance: Normal appearance. She is well-developed.  Musculoskeletal:        General: No deformity.  Neurological:     Mental Status: She is alert and oriented to person, place, and time.     Motor: No tremor.     Coordination: Coordination normal.     Gait: Gait normal.  Psychiatric:        Attention and Perception: She is attentive.        Mood and Affect: Mood is anxious. Mood is not depressed.  Affect is not labile, blunt, angry or inappropriate.        Speech: Speech normal.        Behavior: Behavior normal.        Thought Content: Thought content normal. Thought content does not include homicidal or suicidal ideation. Thought content does not include homicidal or suicidal plan.        Cognition and Memory: Cognition normal.        Judgment: Judgment normal.     Comments: Insight intact. No auditory or visual hallucinations. No delusions.      Lab Review:     Component Value Date/Time   NA 142 04/08/2020 0851   K 4.2 04/08/2020 0851   CL 109 (H) 04/08/2020 0851   CO2 20 04/08/2020 0851   GLUCOSE 81 04/08/2020 0851   GLUCOSE 90 04/10/2019 0930   BUN 11 04/08/2020 0851   CREATININE 0.87 04/08/2020 0851   CREATININE 0.80 11/24/2018 1503   CALCIUM 9.3 04/08/2020 0851   PROT 6.3 04/08/2020 0851   ALBUMIN 4.5 04/08/2020 0851   AST 17 04/08/2020 0851   ALT 17 04/08/2020 0851   ALKPHOS 107 04/08/2020 0851   BILITOT 0.3 04/08/2020 0851   GFRNONAA 77 04/08/2020 0851   GFRAA 89 04/08/2020 0851       Component Value Date/Time   WBC 4.6 12/13/2019 1155   WBC 4.6 04/10/2019 0930   RBC 4.79 12/13/2019 1155   RBC 4.68 04/10/2019 0930   HGB 14.7 12/13/2019 1155    HCT 44.2 12/13/2019 1155   PLT 218 12/13/2019 1155   MCV 92 12/13/2019 1155   MCH 30.7 12/13/2019 1155   MCH 30.6 04/10/2019 0930   MCHC 33.3 12/13/2019 1155   MCHC 31.8 04/10/2019 0930   RDW 12.4 12/13/2019 1155   LYMPHSABS 1.6 12/13/2019 1155   MONOABS 0.4 04/10/2019 0930   EOSABS 0.2 12/13/2019 1155   BASOSABS 0.0 12/13/2019 1155    No results found for: POCLITH, LITHIUM   No results found for: PHENYTOIN, PHENOBARB, VALPROATE, CBMZ   .res Assessment: Plan:    Generalized anxiety disorder - Plan: ALPRAZolam (XANAX XR) 0.5 MG 24 hr tablet, PARoxetine (PAXIL) 40 MG tablet, topiramate (TOPAMAX) 50 MG tablet  Panic disorder with agoraphobia - Plan: ALPRAZolam (XANAX XR) 0.5 MG 24 hr tablet, PARoxetine (PAXIL) 40 MG tablet  Insomnia due to mental condition - Plan: traZODone (DESYREL) 50 MG tablet, zolpidem (AMBIEN) 10 MG tablet  Major depression, recurrent, full remission ([place]) - Plan: ARIPiprazole (ABILIFY) 10 MG tablet, PARoxetine (PAXIL) 40 MG tablet  Shift work sleep disorder   Overall med combo has worked well for depression and anxiety but is better than last time...  Disc options for benefit more: increase Abilify, topiramate off label, prn propranolol. Disc SE in detail of each option.   Prefer to avoid increasing Xanax which can make HER sleepy in the daytime.    Discussed potential metabolic side effects associated with atypical antipsychotics, as well as potential risk for movement side effects. Advised pt to contact office if movement side effects occur.   We discussed the short-term risks associated with benzodiazepines including sedation and increased fall risk among others.  Discussed long-term side effect risk including dependence, potential withdrawal symptoms, and the potential eventual dose-related risk of dementia.  Newer studies published 2020 dispute risk of dementia with benzodiazepines.  And disc risk of Ambien amnesia.  Consider switch to Ativan from Xanax to  reduce the sedation from Bz.    Better self care  and more sleep would help. Protect SLEEP.  Option disc for weight loss Ozempic.  Gave JAMA study to discuss with doctor.  cONTINUE   topiramate bc help weight gain risk to 200mg  daily.  Disc SE.  Rx prn propranolol 20-40 mg BID for anxiety.   Disc polypharmacy is medcially necessary.  It has taken a lot of meds and combination of mechanisms in order to manage her depression and anxiety symptoms.  She tolerates the higher dose of paroxetine without side effects.  Panic was not managed with less paroxetine.  Disc SE in detail and SSRI withdrawal sx. Discussed serotonin syndrome.  Avoid dextromethorphan, tramadol which can increase risk. Continue alprazolam XR 0.5 mg every morning and 1 mg nightly increase aripiprazole 10 mg daily to see anxiety is better Continue paroxetine 80 mg daily Continue propranolol 20 mg tablets 1-2 twice daily as needed anxiety Continue topiramate 50 mg tablets 2 twice daily Continue trazodone 100 mg nightly Continue zolpidem 5 to 10 mg nightly as needed insomnia She typically will alternate trazodone or Ambien depending on her work schedule.  This appt was 30 mins.  FU 6 mos  Lynder Parents, MD, DFAPA     Please see After Visit Summary for patient specific instructions.  Future Appointments  Date Time Provider Cade  05/07/2020  2:20 PM Briscoe Deutscher, DO [facility] None    No orders of the defined types were placed in this encounter.     -------------------------------

## 2020-04-17 MED FILL — WEGOVY 1.7 MG/0.75ML SOAJ: 1.7 | 28 days supply | Qty: 3 | Fill #0

## 2020-05-07 ENCOUNTER — Encounter (INDEPENDENT_AMBULATORY_CARE_PROVIDER_SITE_OTHER): Payer: Self-pay | Admitting: Family Medicine

## 2020-05-07 ENCOUNTER — Other Ambulatory Visit: Payer: Self-pay

## 2020-05-07 ENCOUNTER — Ambulatory Visit (INDEPENDENT_AMBULATORY_CARE_PROVIDER_SITE_OTHER): Payer: No Typology Code available for payment source | Admitting: Family Medicine

## 2020-05-07 VITALS — BP 93/66 | HR 71 | Temp 97.9°F | Ht 64.0 in | Wt 145.0 lb

## 2020-05-07 DIAGNOSIS — E559 Vitamin D deficiency, unspecified: Secondary | ICD-10-CM | POA: Diagnosis not present

## 2020-05-07 DIAGNOSIS — Z9189 Other specified personal risk factors, not elsewhere classified: Secondary | ICD-10-CM

## 2020-05-07 DIAGNOSIS — M8580 Other specified disorders of bone density and structure, unspecified site: Secondary | ICD-10-CM | POA: Diagnosis not present

## 2020-05-07 DIAGNOSIS — E669 Obesity, unspecified: Secondary | ICD-10-CM

## 2020-05-07 DIAGNOSIS — E782 Mixed hyperlipidemia: Secondary | ICD-10-CM | POA: Diagnosis not present

## 2020-05-07 DIAGNOSIS — R632 Polyphagia: Secondary | ICD-10-CM

## 2020-05-07 DIAGNOSIS — Z6830 Body mass index (BMI) 30.0-30.9, adult: Secondary | ICD-10-CM

## 2020-05-08 ENCOUNTER — Other Ambulatory Visit (INDEPENDENT_AMBULATORY_CARE_PROVIDER_SITE_OTHER): Payer: Self-pay | Admitting: Family Medicine

## 2020-05-08 MED ORDER — WEGOVY 1.7 MG/0.75ML ~~LOC~~ SOAJ: 1.7000 mg | SUBCUTANEOUS | 0 refills | Status: DC

## 2020-05-08 MED ORDER — VITAMIN D (ERGOCALCIFEROL) 1.25 MG (50000 UNIT) PO CAPS: 50000.0000 [IU] | ORAL_CAPSULE | ORAL | 0 refills | Status: DC

## 2020-05-08 MED FILL — WEGOVY 1.7 MG/0.75ML SOAJ: 1.7 | 28 days supply | Qty: 3 | Fill #0

## 2020-05-08 MED FILL — VIT D2 1.25 MG (50,000 UNIT: 1.25 MG | 84 days supply | Qty: 12 | Fill #0

## 2020-05-08 NOTE — Progress Notes (Signed)
Chief Complaint:   OBESITY Brooke [name] is here to discuss her progress with her obesity treatment plan along with follow-up of her obesity related diagnoses.   Today's visit was #: 7 Starting weight: 185 lbs Starting date: 12/13/2019 Today's weight: 145 lbs Today's date: 05/07/2020 Total lbs lost to date: 40 lbs Body mass index is 24.89 kg/m.  Total weight loss percentage to date: -21.62%  Interim History: Brooke [name]'s BMI is at 25.  She would like to lose 5 more pounds and then work on maintenance.  Plan:  IC at next visit. Nutrition Plan: keeping a food journal and adhering to recommended goals of 1000 calories and 85 grams of protein 80% of the time.  Anti-obesity medications: Wegovy 1.7 mg subcutaneously weekly. Reported side effects: None. Hunger is well controlled. Cravings are well controlled.  Activity: Walking for 30 minutes 2 times per week.  Assessment/Plan:   1. Vitamin D deficiency Not at goal. Current vitamin D is 28.5, tested on 04/08/2020. Optimal goal > 50 ng/dL.   Plan:  [x]   Continue Vitamin D @50 ,000 IU every week. []   Continue home supplement daily. [x]   Follow-up for routine testing of Vitamin D at least 2-3 times per year to avoid over-replacement.  - Refill Vitamin D, Ergocalciferol, (DRISDOL) 1.25 MG (50000 UNIT) CAPS capsule; Take 1 capsule (50,000 Units total) by mouth every 7 (seven) days.  Dispense: 12 capsule; Refill: 0  2. Mixed hyperlipidemia Course: Improving.   Plan:  Will continue to monitor.  Dietary changes: Increase soluble fiber. Decrease simple carbohydrates. Exercise changes: An average 40 minutes of moderate to vigorous-intensity aerobic activity 3 or 4 times per week.   Lab Results  Component Value Date   CHOL 230 (H) 04/08/2020   HDL 51 04/08/2020   LDLCALC 163 (H) 04/08/2020   LDLDIRECT 130.6 09/05/2009   TRIG 90 04/08/2020   CHOLHDL 4.5 (H) 04/08/2020   Lab Results  Component Value Date   ALT 17 04/08/2020   AST 17  04/08/2020   ALKPHOS 107 04/08/2020   BILITOT 0.3 04/08/2020   The 10-year ASCVD risk score 14/11/2019 DC Jr., et al., 2013) is: 1%   Values used to calculate the score:     Age: 50 years     Sex: Female     Is Non-Hispanic African American: No     Diabetic: No     Tobacco smoker: No     Systolic Blood Pressure: 93 mmHg     Is BP treated: No     HDL Cholesterol: 51 mg/dL     Total Cholesterol: 230 mg/dL  3. Osteopenia She has a history of Depo Provera injections.  Plan:  Strength training, maintain calcium and vitamin D. Continue DEXA per routine screening.   4. Polyphagia Controlled on current treatment. She will continue to focus on protein-rich, low simple carbohydrate foods. We reviewed the importance of hydration, regular exercise for stress reduction, and restorative sleep.  - Refill Semaglutide-Weight Management (WEGOVY) 1.7 MG/0.75ML SOAJ; Inject 1.7 mg into the skin once a week.  Dispense: 3 mL; Refill: 0  5. At risk for osteoporosis Brooke [name] was given approximately 8 minutes of osteoporosis prevention counseling today. Brooke [name] is at risk for osteopenia and osteoporosis due to her Vitamin D deficiency and history of chronic Depo Provera use. She was encouraged to take her Vitamin D and follow her higher calcium diet and increase strengthening exercise to help strengthen her bones and decrease her risk of osteopenia and osteoporosis.  6.  Class 1 obesity with serious comorbidity and body mass index (BMI) of 30.0 to 30.9 in adult, unspecified obesity type  Course: Brooke [name] is currently in the action stage of change. As such, her goal is to continue with weight loss efforts.   Nutrition goals: She has agreed to keeping a food journal and adhering to recommended goals of 1000 calories and 85 grams of protein.   Exercise goals: For substantial health benefits, adults should do at least 150 minutes (2 hours and 30 minutes) a week of moderate-intensity, or 75 minutes (1 hour and 15  minutes) a week of vigorous-intensity aerobic physical activity, or an equivalent combination of moderate- and vigorous-intensity aerobic activity. Aerobic activity should be performed in episodes of at least 10 minutes, and preferably, it should be spread throughout the week.  Behavioral modification strategies: increasing lean protein intake, decreasing simple carbohydrates, increasing vegetables, increasing water intake and decreasing liquid calories.  Brooke [name] has agreed to follow-up with our clinic in 4 weeks. She was informed of the importance of frequent follow-up visits to maximize her success with intensive lifestyle modifications for her multiple health conditions.   Objective:   Blood pressure 93/66, pulse 71, temperature 97.9 F (36.6 C), temperature source Oral, height 5\' 4"  (1.626 m), weight 145 lb (65.8 kg), last menstrual period 04/03/2018, SpO2 96 %. Body mass index is 24.89 kg/m.  General: Cooperative, alert, well developed, in no acute distress. HEENT: Conjunctivae and lids unremarkable. Cardiovascular: Regular rhythm.  Lungs: Normal work of breathing. Neurologic: No focal deficits.   Lab Results  Component Value Date   CREATININE 0.87 04/08/2020   BUN 11 04/08/2020   NA 142 04/08/2020   K 4.2 04/08/2020   CL 109 (H) 04/08/2020   CO2 20 04/08/2020   Lab Results  Component Value Date   ALT 17 04/08/2020   AST 17 04/08/2020   ALKPHOS 107 04/08/2020   BILITOT 0.3 04/08/2020   Lab Results  Component Value Date   HGBA1C 5.5 12/13/2019   HGBA1C 5.5 03/05/2013   Lab Results  Component Value Date   INSULIN 12.0 12/13/2019   Lab Results  Component Value Date   TSH 3.960 12/13/2019   Lab Results  Component Value Date   CHOL 230 (H) 04/08/2020   HDL 51 04/08/2020   LDLCALC 163 (H) 04/08/2020   LDLDIRECT 130.6 09/05/2009   TRIG 90 04/08/2020   CHOLHDL 4.5 (H) 04/08/2020   Lab Results  Component Value Date   WBC 4.6 12/13/2019   HGB 14.7 12/13/2019    HCT 44.2 12/13/2019   MCV 92 12/13/2019   PLT 218 12/13/2019   Lab Results  Component Value Date   IRON 83 12/13/2019   TIBC 258 12/13/2019   FERRITIN 70 12/13/2019   Attestation Statements:   Reviewed by clinician on day of visit: allergies, medications, problem list, medical history, surgical history, family history, social history, and previous encounter notes.  I, Water quality scientist, CMA, am acting as transcriptionist for Briscoe Deutscher, DO  I have reviewed the above documentation for accuracy and completeness, and I agree with the above. Briscoe Deutscher, DO

## 2020-05-26 ENCOUNTER — Encounter (INDEPENDENT_AMBULATORY_CARE_PROVIDER_SITE_OTHER): Payer: Self-pay

## 2020-05-26 MED FILL — ZOLPIDEM TARTRATE 10 MG TAB: 10 | 90 days supply | Qty: 90 | Fill #0

## 2020-06-04 ENCOUNTER — Encounter (INDEPENDENT_AMBULATORY_CARE_PROVIDER_SITE_OTHER): Payer: Self-pay | Admitting: Family Medicine

## 2020-06-04 ENCOUNTER — Other Ambulatory Visit: Payer: Self-pay

## 2020-06-04 ENCOUNTER — Ambulatory Visit (INDEPENDENT_AMBULATORY_CARE_PROVIDER_SITE_OTHER): Payer: No Typology Code available for payment source | Admitting: Family Medicine

## 2020-06-04 VITALS — BP 88/52 | HR 106 | Temp 98.3°F | Ht 64.0 in | Wt 142.0 lb

## 2020-06-04 DIAGNOSIS — F411 Generalized anxiety disorder: Secondary | ICD-10-CM

## 2020-06-04 DIAGNOSIS — Z6830 Body mass index (BMI) 30.0-30.9, adult: Secondary | ICD-10-CM

## 2020-06-04 DIAGNOSIS — E782 Mixed hyperlipidemia: Secondary | ICD-10-CM

## 2020-06-04 DIAGNOSIS — R0602 Shortness of breath: Secondary | ICD-10-CM

## 2020-06-04 DIAGNOSIS — I9589 Other hypotension: Secondary | ICD-10-CM

## 2020-06-04 DIAGNOSIS — R632 Polyphagia: Secondary | ICD-10-CM

## 2020-06-04 DIAGNOSIS — E559 Vitamin D deficiency, unspecified: Secondary | ICD-10-CM | POA: Diagnosis not present

## 2020-06-04 DIAGNOSIS — E669 Obesity, unspecified: Secondary | ICD-10-CM

## 2020-06-04 DIAGNOSIS — Z9189 Other specified personal risk factors, not elsewhere classified: Secondary | ICD-10-CM

## 2020-06-04 NOTE — Progress Notes (Signed)
Chief Complaint:   OBESITY Brooke [name] is here to discuss her progress with her obesity treatment plan along with follow-up of her obesity related diagnoses.   Today's visit was #: 8 Starting weight: 185 lbs Starting date: 12/13/2019 Today's weight: 142 lbs Today's date: 06/04/2020 Total lbs lost to date: 43 lbs Body mass index is 24.37 kg/m.  Total weight loss percentage to date: -23.24%  Interim History: Brooke [name] admits to not drinking enough water.  Endorses orthostatic symptoms.Plan:  Will increase water with electrolytes.  Nutrition Plan: keeping a food journal and adhering to recommended goals of 1000 calories and 85 grams of protein for 90% of the time.  Anti-obesity medications: Wegovy 1.7 mg subcutaneously weekly. Reported side effects: None. Activity: Increased activity at work.  Assessment/Plan:   1. SOB (shortness of breath) on exertion IC performed today.  RMR went from 1398 to 1584.  2. Vitamin D deficiency Not at goal. Current vitamin D is 28.5, tested on 04/08/2020. Optimal goal > 50 ng/dL.   Plan:  [x]   Continue Vitamin D @50 ,000 IU every week. []   Continue home supplement daily. [x]   Follow-up for routine testing of Vitamin D at least 2-3 times per year to avoid over-replacement.  3. Mixed hyperlipidemia Lipid-lowering medications: None.   Plan: Dietary changes: Increase soluble fiber. Decrease simple carbohydrates. Exercise changes: An average 40 minutes of moderate to vigorous-intensity aerobic activity 3 or 4 times per week.   Lab Results  Component Value Date   CHOL 230 (H) 04/08/2020   HDL 51 04/08/2020   LDLCALC 163 (H) 04/08/2020   LDLDIRECT 130.6 09/05/2009   TRIG 90 04/08/2020   CHOLHDL 4.5 (H) 04/08/2020   Lab Results  Component Value Date   ALT 17 04/08/2020   AST 17 04/08/2020   ALKPHOS 107 04/08/2020   BILITOT 0.3 04/08/2020   4. Polyphagia Controlled with Wegovy 1 mg subcutaneously weekly.  Hyperphagia, also called  polyphagia, refers to excessive feelings of hunger. This is more likely to be an issues for people that have diabetes, prediabetes, or insulin resistance. She will continue to focus on protein-rich, low simple carbohydrate foods. We reviewed the importance of hydration, regular exercise for stress reduction, and restorative sleep.  5. Chronic low blood pressure Shikita will start drinking more water with electrolytes.  6. GAD (generalized anxiety disorder) She is taking Paxil and alprazolam for anxiety.  Behavior modification techniques were discussed today to help Tameca deal with her anxiety.  Orders and follow up as documented in patient record.   7. At risk for deficient intake of food Jenissa was given extensive education and counseling today of more than 11 minutes on risks associated with deficient food intake.  Counseled her on the importance of following our prescribed meal plan and eating adequate amounts of protein.  Discussed with Brooke [name] that inadequate food intake over longer periods of time can slow their metabolism down significantly.   Will increase to 1400-1500 calories per day with room for occasional social/celebration eating.  8. Class 1 obesity with serious comorbidity and body mass index (BMI) of 30.0 to 30.9 in adult, unspecified obesity type  Course: Brooke [name] is currently in the action stage of change. As such, her goal is to continue with weight loss efforts.   Nutrition goals: She has agreed to keeping a food journal and adhering to recommended goals of 1400-1500 calories and 95 grams of protein.   Exercise goals: As is.  Behavioral modification strategies: increasing lean protein intake  and increasing water intake.  Increase to maintenance mode.  Brooke [name] has agreed to follow-up with our clinic in 3 weeks. She was informed of the importance of frequent follow-up visits to maximize her success with intensive lifestyle modifications for her  multiple health conditions.   Objective:   Blood pressure (!) 88/52, pulse (!) 106, temperature 98.3 F (36.8 C), temperature source Oral, height 5\' 4"  (1.626 m), weight 142 lb (64.4 kg), last menstrual period 04/03/2018, SpO2 99 %. Body mass index is 24.37 kg/m.  General: Cooperative, alert, well developed, in no acute distress. HEENT: Conjunctivae and lids unremarkable. Cardiovascular: Regular rhythm.  Lungs: Normal work of breathing. Neurologic: No focal deficits.   Lab Results  Component Value Date   CREATININE 0.87 04/08/2020   BUN 11 04/08/2020   NA 142 04/08/2020   K 4.2 04/08/2020   CL 109 (H) 04/08/2020   CO2 20 04/08/2020   Lab Results  Component Value Date   ALT 17 04/08/2020   AST 17 04/08/2020   ALKPHOS 107 04/08/2020   BILITOT 0.3 04/08/2020   Lab Results  Component Value Date   HGBA1C 5.5 12/13/2019   HGBA1C 5.5 03/05/2013   Lab Results  Component Value Date   INSULIN 12.0 12/13/2019   Lab Results  Component Value Date   TSH 3.960 12/13/2019   Lab Results  Component Value Date   CHOL 230 (H) 04/08/2020   HDL 51 04/08/2020   LDLCALC 163 (H) 04/08/2020   LDLDIRECT 130.6 09/05/2009   TRIG 90 04/08/2020   CHOLHDL 4.5 (H) 04/08/2020   Lab Results  Component Value Date   WBC 4.6 12/13/2019   HGB 14.7 12/13/2019   HCT 44.2 12/13/2019   MCV 92 12/13/2019   PLT 218 12/13/2019   Lab Results  Component Value Date   IRON 83 12/13/2019   TIBC 258 12/13/2019   FERRITIN 70 12/13/2019   Attestation Statements:   Reviewed by clinician on day of visit: allergies, medications, problem list, medical history, surgical history, family history, social history, and previous encounter notes.  I, Water quality scientist, CMA, am acting as transcriptionist for Briscoe Deutscher, DO  I have reviewed the above documentation for accuracy and completeness, and I agree with the above. Briscoe Deutscher, DO

## 2020-06-17 ENCOUNTER — Encounter (INDEPENDENT_AMBULATORY_CARE_PROVIDER_SITE_OTHER): Payer: Self-pay

## 2020-06-17 ENCOUNTER — Other Ambulatory Visit (INDEPENDENT_AMBULATORY_CARE_PROVIDER_SITE_OTHER): Payer: Self-pay | Admitting: Family Medicine

## 2020-06-17 DIAGNOSIS — R632 Polyphagia: Secondary | ICD-10-CM

## 2020-06-17 NOTE — Telephone Encounter (Signed)
Message sent to pt.

## 2020-06-17 NOTE — Telephone Encounter (Signed)
Last seen Dr. Wallace

## 2020-06-25 ENCOUNTER — Other Ambulatory Visit (INDEPENDENT_AMBULATORY_CARE_PROVIDER_SITE_OTHER): Payer: Self-pay | Admitting: Family Medicine

## 2020-06-25 ENCOUNTER — Encounter (INDEPENDENT_AMBULATORY_CARE_PROVIDER_SITE_OTHER): Payer: Self-pay | Admitting: Family Medicine

## 2020-06-25 ENCOUNTER — Encounter: Payer: Self-pay | Admitting: Family Medicine

## 2020-06-25 DIAGNOSIS — Z1211 Encounter for screening for malignant neoplasm of colon: Secondary | ICD-10-CM

## 2020-06-25 MED ORDER — WEGOVY 1.7 MG/0.75ML ~~LOC~~ SOAJ: 1.7000 mg | SUBCUTANEOUS | 0 refills | Status: DC

## 2020-06-25 MED FILL — WEGOVY 1.7 MG/0.75ML SOAJ: 1.7 | 28 days supply | Qty: 3 | Fill #0

## 2020-06-25 NOTE — Telephone Encounter (Signed)
See other TE.

## 2020-07-01 ENCOUNTER — Ambulatory Visit (INDEPENDENT_AMBULATORY_CARE_PROVIDER_SITE_OTHER): Payer: No Typology Code available for payment source | Admitting: Family Medicine

## 2020-07-01 ENCOUNTER — Encounter (INDEPENDENT_AMBULATORY_CARE_PROVIDER_SITE_OTHER): Payer: Self-pay

## 2020-07-03 ENCOUNTER — Other Ambulatory Visit: Payer: Self-pay | Admitting: Psychiatry

## 2020-07-03 DIAGNOSIS — F411 Generalized anxiety disorder: Secondary | ICD-10-CM

## 2020-07-03 DIAGNOSIS — F4001 Agoraphobia with panic disorder: Secondary | ICD-10-CM

## 2020-07-03 DIAGNOSIS — F3342 Major depressive disorder, recurrent, in full remission: Secondary | ICD-10-CM

## 2020-07-03 MED FILL — PARoxetine HCL 40 MG TABS: 40 | 90 days supply | Qty: 180 | Fill #0

## 2020-07-09 ENCOUNTER — Encounter (INDEPENDENT_AMBULATORY_CARE_PROVIDER_SITE_OTHER): Payer: Self-pay | Admitting: Physician Assistant

## 2020-07-09 ENCOUNTER — Other Ambulatory Visit: Payer: Self-pay

## 2020-07-09 ENCOUNTER — Ambulatory Visit (INDEPENDENT_AMBULATORY_CARE_PROVIDER_SITE_OTHER): Payer: No Typology Code available for payment source | Admitting: Physician Assistant

## 2020-07-09 VITALS — BP 95/64 | HR 91 | Temp 97.5°F | Ht 64.0 in | Wt 139.0 lb

## 2020-07-09 DIAGNOSIS — Z6831 Body mass index (BMI) 31.0-31.9, adult: Secondary | ICD-10-CM

## 2020-07-09 DIAGNOSIS — E559 Vitamin D deficiency, unspecified: Secondary | ICD-10-CM | POA: Diagnosis not present

## 2020-07-09 DIAGNOSIS — E669 Obesity, unspecified: Secondary | ICD-10-CM

## 2020-07-09 DIAGNOSIS — Z9189 Other specified personal risk factors, not elsewhere classified: Secondary | ICD-10-CM | POA: Diagnosis not present

## 2020-07-09 DIAGNOSIS — I9589 Other hypotension: Secondary | ICD-10-CM | POA: Diagnosis not present

## 2020-07-09 DIAGNOSIS — R632 Polyphagia: Secondary | ICD-10-CM

## 2020-07-14 ENCOUNTER — Other Ambulatory Visit (INDEPENDENT_AMBULATORY_CARE_PROVIDER_SITE_OTHER): Payer: Self-pay | Admitting: Physician Assistant

## 2020-07-14 MED ORDER — WEGOVY 1.7 MG/0.75ML ~~LOC~~ SOAJ: 1.7000 mg | SUBCUTANEOUS | 0 refills | Status: DC

## 2020-07-15 NOTE — Progress Notes (Signed)
Chief Complaint:   OBESITY Brooke [name] is here to discuss her progress with her obesity treatment plan along with follow-up of her obesity related diagnoses. Brooke [name] is on keeping a food journal and adhering to recommended goals of 1400-1500 calories and 95 grams of protein daily and states she is following her eating plan approximately 60% of the time. Brooke [name] states she is walking for 30 minutes 3 times per week.  Today's visit was #: 9 Starting weight: 185 lbs Starting date: 12/13/2019 Today's weight: 139 lbs Today's date: 07/09/2020 Total lbs lost to date: 46 Total lbs lost since last in-office visit: 3  Interim History: Brooke [name] reports that she has not been journaling consistently. She is in a maintenance phrase, but lost 3 lbs. She has increased her water intake. She is unsure of how many calories that she is actually getting daily. We discussed decreasing Wegovy at her next visit and increasing her calories and protein starting today.  Subjective:   1. Vitamin D deficiency Brooke [name] is on Vit D, and she denies nausea, vomiting, or muscle weakness. She is due for labs soon.  2. Other specified hypotension Brooke [name] continue to have intermittent dizziness, lightheadedness, and fatigue. She does not check her blood pressure at home.  3. At risk for osteoporosis  Brooke [name] is at higher risk of osteopenia and osteoporosis due to Vitamin D deficiency.   Assessment/Plan:   1. Vitamin D deficiency Low Vitamin D level contributes to fatigue and are associated with obesity, breast, and colon cancer. Brooke [name] agreed to continue taking prescription Vitamin D 50,000 IU every week, and we will recheck labs at her next visit. She will follow-up for routine testing of Vitamin D, at least 2-3 times per year to avoid over-replacement.  2. Other specified hypotension Brooke [name] is to check her blood pressure at work record, and will follow up with her primary care physician. She will  continue working on healthy weight loss and exercise to improve blood pressure control. We will watch for signs of hypotension as she continues her lifestyle modifications.  3. At risk for osteoporosis Brooke [name] was given approximately 15 minutes of osteoporosis prevention counseling today. Brooke [name] is at risk for osteopenia and osteoporosis due to her Vitamin D deficiency. She was encouraged to take her Vitamin D and follow her higher calcium diet and increase strengthening exercise to help strengthen her bones and decrease her risk of osteopenia and osteoporosis.  Repetitive spaced learning was employed today to elicit superior memory formation and behavioral change.  4. Class 1 obesity with serious comorbidity and body mass index (BMI) of 31.0 to 31.9 in adult, unspecified obesity type Brooke [name] is currently in the action stage of change. As such, her goal is to continue with weight loss efforts. She has agreed to keeping a food journal and adhering to recommended goals of 1600-1700 calories and 100 grams of protein daily.   We discussed various medication options to help Brooke [name] with her weight loss efforts and we both agreed to continue Brooke [name], and we will refill for 1 month.  - Semaglutide-Weight Management (WEGOVY) 1.7 MG/0.75ML SOAJ; Inject 1.7 mg into the skin once a week.  Dispense: 3 mL; Refill: 0  We will recheck fasting labs at her next visit.  Exercise goals: As is.  Behavioral modification strategies: keeping a strict food journal.  Brooke [name] has agreed to follow-up with our clinic in 4 weeks. She was informed of the importance of frequent follow-up visits to maximize her success with intensive lifestyle modifications  for her multiple health conditions.   Objective:   Blood pressure 95/64, pulse 91, temperature (!) 97.5 F (36.4 C), height 5\' 4"  (1.626 m), weight 139 lb (63 kg), last menstrual period 04/03/2018, SpO2 96 %. Body mass index is 23.86 kg/m.  General:  Cooperative, alert, well developed, in no acute distress. HEENT: Conjunctivae and lids unremarkable. Cardiovascular: Regular rhythm.  Lungs: Normal work of breathing. Neurologic: No focal deficits.   Lab Results  Component Value Date   CREATININE 0.87 04/08/2020   BUN 11 04/08/2020   NA 142 04/08/2020   K 4.2 04/08/2020   CL 109 (H) 04/08/2020   CO2 20 04/08/2020   Lab Results  Component Value Date   ALT 17 04/08/2020   AST 17 04/08/2020   ALKPHOS 107 04/08/2020   BILITOT 0.3 04/08/2020   Lab Results  Component Value Date   HGBA1C 5.5 12/13/2019   HGBA1C 5.5 03/05/2013   Lab Results  Component Value Date   INSULIN 12.0 12/13/2019   Lab Results  Component Value Date   TSH 3.960 12/13/2019   Lab Results  Component Value Date   CHOL 230 (H) 04/08/2020   HDL 51 04/08/2020   LDLCALC 163 (H) 04/08/2020   LDLDIRECT 130.6 09/05/2009   TRIG 90 04/08/2020   CHOLHDL 4.5 (H) 04/08/2020   Lab Results  Component Value Date   WBC 4.6 12/13/2019   HGB 14.7 12/13/2019   HCT 44.2 12/13/2019   MCV 92 12/13/2019   PLT 218 12/13/2019   Lab Results  Component Value Date   IRON 83 12/13/2019   TIBC 258 12/13/2019   FERRITIN 70 12/13/2019   Attestation Statements:   Reviewed by clinician on day of visit: allergies, medications, problem list, medical history, surgical history, family history, social history, and previous encounter notes.   Wilhemena Durie, am acting as transcriptionist for [employer], PA-C.  I have reviewed the above documentation for accuracy and completeness, and I agree with the above. Abby Potash, PA-C

## 2020-07-23 MED FILL — WEGOVY 1.7 MG/0.75ML SOAJ: 1.7 | 28 days supply | Qty: 3 | Fill #0

## 2020-07-24 ENCOUNTER — Encounter: Payer: Self-pay | Admitting: Physician Assistant

## 2020-08-06 ENCOUNTER — Encounter: Payer: Self-pay | Admitting: Physician Assistant

## 2020-08-06 ENCOUNTER — Ambulatory Visit (INDEPENDENT_AMBULATORY_CARE_PROVIDER_SITE_OTHER): Payer: No Typology Code available for payment source | Admitting: Physician Assistant

## 2020-08-06 VITALS — BP 92/60 | HR 48 | Ht 63.5 in | Wt 143.0 lb

## 2020-08-06 DIAGNOSIS — K5900 Constipation, unspecified: Secondary | ICD-10-CM

## 2020-08-06 DIAGNOSIS — Z1212 Encounter for screening for malignant neoplasm of rectum: Secondary | ICD-10-CM | POA: Diagnosis not present

## 2020-08-06 DIAGNOSIS — Z1211 Encounter for screening for malignant neoplasm of colon: Secondary | ICD-10-CM | POA: Diagnosis not present

## 2020-08-06 DIAGNOSIS — K625 Hemorrhage of anus and rectum: Secondary | ICD-10-CM

## 2020-08-06 NOTE — Patient Instructions (Signed)
If you are age 52 or older, your body mass index should be between 23-30. Your Body mass index is 24.93 kg/m. If this is out of the aforementioned range listed, please consider follow up with your Primary Care Provider.  If you are age 63 or younger, your body mass index should be between 19-25. Your Body mass index is 24.93 kg/m. If this is out of the aformentioned range listed, please consider follow up with your Primary Care Provider.   You have been scheduled for a colonoscopy. Please follow written instructions given to you at your visit today.  Please pick up your prep supplies at the pharmacy within the next 1-3 days. If you use inhalers (even only as needed), please bring them with you on the day of your procedure.  Start Miralax once daily.  It was a pleasure to see you today!  Ellouise Newer, PA-C

## 2020-08-06 NOTE — Progress Notes (Signed)
Reviewed.  Yee Joss L. Jenina Moening, MD, MPH  

## 2020-08-06 NOTE — Progress Notes (Signed)
Chief Complaint: Constipation, rectal bleeding  HPI:    Brooke [name] is a 50 year old female with a past medical history as listed below, who was referred to me by Carollee Herter, Alferd Apa, * for a complaint of constipation and rectal bleeding.      Today, the patient tells me that she is due for her screening colonoscopy.  Explains that she is chronically constipated and typically uses a stimulant laxative, which is just generic from [place], about once a week in order to relieve herself.  In between she will have no movements.  This has been going on for years but seems somewhat worse recently.  Tells me that when she is constipated she will pass hard, large stool and typically sees some bright red blood mixed in with the stool and on the toilet paper.  This has been occurring for some time now.  Associated symptoms include some abdominal discomfort during constipation.    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    Denies fever, chills, weight loss or symptoms that awaken her from sleep.  Past Medical History:  Diagnosis Date  . Anxiety   . Bursitis   . Constipation   . Depression   . Fatty liver    seen on [place] 04-2018  . HLD (hyperlipidemia)   . Hyperglycemia 03/05/2013  . Incontinence   . Lower extremity edema   . Lumbar stress fracture   . Osteoporosis   . SOB (shortness of breath)     Past Surgical History:  Procedure Laterality Date  . EXCISION/RELEASE BURSA HIP Right 04/11/2019   Procedure: Right hip bursectomy;  Surgeon: [name], MD;  Location: [facility];  Service: Orthopedics;  Laterality: Right;  62min  . RHINOPLASTY    . SINUS ENDO W/FUSION    . TONSILLECTOMY      Current Outpatient Medications  Medication Sig Dispense Refill  . ALPRAZolam (XANAX XR) 0.5 MG 24 hr tablet TAKE ONE TABLET BY MOUTH IN THE MORNING IF NEEDED FOR ANXIETY AND TWO TABLETS AT NIGHT FOR ANXIETY AND SLEEP 270 tablet 0  . ARIPiprazole (ABILIFY) 10 MG tablet TAKE 1 TABLET (10 MG TOTAL) BY  MOUTH DAILY. 90 tablet 1  . ibuprofen (ADVIL) 200 MG tablet Take 800 mg by mouth every 6 (six) hours as needed.    Marland Kitchen PARoxetine (PAXIL) 40 MG tablet TAKE 2 TABLETS (80 MG TOTAL) BY MOUTH AT BEDTIME. 180 tablet 0  . propranolol (INDERAL) 10 MG tablet Take 5 mg by mouth daily as needed.    . Semaglutide-Weight Management 1.7 MG/0.75ML SOAJ INJECT 1.7 MG INTO THE SKIN ONCE A WEEK. 3 mL 0  . topiramate (TOPAMAX) 50 MG tablet TAKE 2 TABLETS (100 MG TOTAL) BY MOUTH 2 (TWO) TIMES DAILY. 360 tablet 1  . traZODone (DESYREL) 50 MG tablet TAKE 2 TABLETS (100 MG TOTAL) BY MOUTH AT BEDTIME. 180 tablet 1  . Vitamin D, Ergocalciferol, (DRISDOL) 1.25 MG (50000 UNIT) CAPS capsule TAKE 1 CAPSULE BY MOUTH EVERY 7 DAYS 12 capsule 0  . zolpidem (AMBIEN) 10 MG tablet TAKE 1 TABLET (10 MG TOTAL) BY MOUTH AT BEDTIME AS NEEDED FOR SLEEP. 90 tablet 1   No current facility-administered medications for this visit.    Allergies as of 08/06/2020  . (No Known Allergies)    Family History  Problem Relation Age of Onset  . Thyroid disease Mother   . Hyperlipidemia Mother   . Diabetes Mother   . Hypertension Mother   .  Diabetes Father   . Hypertension Father   . Hyperlipidemia Father   . Diabetes Paternal Grandmother   . Hypertension Paternal Grandmother   . Hyperlipidemia Paternal Grandmother   . Mental illness Sister   . Hypertension Paternal Uncle   . Diabetes Paternal Aunt     Social History   Socioeconomic History  . Marital status: Married    Spouse name: Not on file  . Number of children: Not on file  . Years of education: 24  . Highest education level: Not on file  Occupational History  . Occupation: RN/OR    Employer: [employer]  Tobacco Use  . Smoking status: Never Smoker  . Smokeless tobacco: Never Used  Vaping Use  . Vaping Use: Never used  Substance and Sexual Activity  . Alcohol use: No    Alcohol/week: 0.0 standard drinks  . Drug use: No  . Sexual activity: Yes    Partners: Male     Birth control/protection: Surgical    Comment: Husband's vasectomy  Other Topics Concern  . Not on file  Social History Narrative  . Not on file   Social Determinants of Health   Financial Resource Strain: Not on file  Food Insecurity: Not on file  Transportation Needs: Not on file  Physical Activity: Not on file  Stress: Not on file  Social Connections: Not on file  Intimate Partner Violence: Not on file    Review of Systems:    Constitutional: No weight loss, fever or chills Skin: No rash Cardiovascular: No chest pain  Respiratory: No SOB Gastrointestinal: See HPI and otherwise negative Genitourinary: No dysuria  Neurological: No headache, dizziness or syncope Musculoskeletal: No new muscle or joint pain Hematologic: No bruising Psychiatric: No history of depression or anxiety   Physical Exam:  Vital signs: BP 92/60   Pulse (!) 48   Ht 5' 3.5" (1.613 m)   Wt 143 lb (64.9 kg)   LMP 04/03/2018   BMI 24.93 kg/m   Constitutional:   Pleasant Caucasian female appears to be in NAD, Well developed, Well nourished, alert and cooperative Head:  Normocephalic and atraumatic. Eyes:   PEERL, EOMI. No icterus. Conjunctiva pink. Ears:  Normal auditory acuity. Neck:  Supple Throat: Oral cavity and pharynx without inflammation, swelling or lesion.  Respiratory: Respirations even and unlabored. Lungs clear to auscultation bilaterally.   No wheezes, crackles, or rhonchi.  Cardiovascular: Normal S1, S2. No MRG. Regular rate and rhythm. No peripheral edema, cyanosis or pallor.  Gastrointestinal:  Soft, nondistended, mild LLQ ttp. No rebound or guarding. Normal bowel sounds. No appreciable masses or hepatomegaly. Rectal:  Not performed.  Msk:  Symmetrical without gross deformities. Without edema, no deformity or joint abnormality.  Neurologic:  Alert and  oriented x4;  grossly normal neurologically.  Skin:   Dry and intact without significant lesions or rashes. Psychiatric:   Demonstrates good judgement and reason without abnormal affect or behaviors.  No recent labs or imaging.  Assessment: 1.  Screening for colorectal cancer: Patient is 59 and never had a screening colonoscopy 2.  Chronic constipation: For years, relieved with a stimulant laxative once a week; consider IBS-C were slow transit 3.  Rectal bleeding: Typically when passing a constipated stool; most likely hemorrhoids  Plan: 1.  Scheduled patient for a screening colonoscopy with Dr. Tarri Glenn in the [facility].  Did provide the patient with a detailed list of risks for the procedure and she agrees to proceed. 2.  Recommend the patient get on a  better bowel regimen.  We discussed using MiraLAX once daily.  Discussed titration of this up to 4 times a day if needed. 3.  Patient will complete a 2-day bowel prep before time of procedure. 4.  Patient to follow in clinic per recommendations from Dr. Tarri Glenn after time of procedure.  Ellouise Newer, PA-C [facility] 08/06/2020, 1:48 PM  Cc: Carollee Herter, Alferd Apa, *

## 2020-08-09 ENCOUNTER — Other Ambulatory Visit: Payer: Self-pay

## 2020-08-09 ENCOUNTER — Encounter (HOSPITAL_COMMUNITY): Payer: Self-pay

## 2020-08-09 ENCOUNTER — Ambulatory Visit (HOSPITAL_COMMUNITY)
Admission: EM | Admit: 2020-08-09 | Discharge: 2020-08-09 | Disposition: A | Discharge status: Home / Self Care | Payer: No Typology Code available for payment source | Attending: Student | Admitting: Student

## 2020-08-09 DIAGNOSIS — J0101 Acute recurrent maxillary sinusitis: Secondary | ICD-10-CM | POA: Diagnosis not present

## 2020-08-09 DIAGNOSIS — R059 Cough, unspecified: Secondary | ICD-10-CM | POA: Diagnosis present

## 2020-08-09 DIAGNOSIS — J301 Allergic rhinitis due to pollen: Secondary | ICD-10-CM | POA: Insufficient documentation

## 2020-08-09 DIAGNOSIS — Z79899 Other long term (current) drug therapy: Secondary | ICD-10-CM | POA: Insufficient documentation

## 2020-08-09 DIAGNOSIS — Z9889 Other specified postprocedural states: Secondary | ICD-10-CM | POA: Diagnosis not present

## 2020-08-09 DIAGNOSIS — Z20822 Contact with and (suspected) exposure to covid-19: Secondary | ICD-10-CM | POA: Insufficient documentation

## 2020-08-09 DIAGNOSIS — J029 Acute pharyngitis, unspecified: Secondary | ICD-10-CM | POA: Insufficient documentation

## 2020-08-09 LAB — SARS CORONAVIRUS 2 (TAT 6-24 HRS): SARS Coronavirus 2: NEGATIVE

## 2020-08-09 MED ORDER — AMOXICILLIN-POT CLAVULANATE 875-125 MG PO TABS: 1.0000 | ORAL_TABLET | Freq: Two times a day (BID) | ORAL | 0 refills | Status: DC

## 2020-08-09 MED ORDER — FLUTICASONE PROPIONATE 50 MCG/ACT NA SUSP: 1.0000 | Freq: Every day | NASAL | 2 refills | Status: DC

## 2020-08-09 NOTE — ED Triage Notes (Signed)
Pt c/o sinus drainage, nasal congestion, sore throat and cough x 1 week. Pt states she had nose bleeds last night.

## 2020-08-09 NOTE — Discharge Instructions (Addendum)
-  Start the antibiotic- Augmentin (amoxicillin-clavulanate), twice daily for 7 days. You can take this with food if you have a sensitive stomach.  -Also try zyrtec once daily for at least 2 weeks, continue this for longer as needed -Try flonase nasal steroid for improved control of nasal congestion -Seek additional medical attention if your symptoms persist despite treatment, like new fever/chills, shortness of breath, dizziness etc.

## 2020-08-09 NOTE — ED Provider Notes (Signed)
[place]    CSN: [id number] Arrival date & time: 08/09/20  1507      History   Chief Complaint Chief Complaint  Patient presents with  . Nasal Congestion  . Sore Throat  . Cough    HPI Brooke [name] is a 50 y.o. female presenting with sinus issue. Medical history recurrent sinus infections and sinus surgery in the past per patient.  Also with history of allergic rhinitis which she is taking no medications..  Few weeks of nasal congestion.  Notes 1 week of sinus pressure, copious green nasal drainage, postnasal drip, sore throat, frequently clearing her throat.  States that there has been some blood in her nasal sputum.  Denies fever/chills, states she has not taken an antipyretic today.  Denies cough, shortness of breath, ear pain, nausea/vomiting/diarrhea. Per patient has been followed by ENT in the past and they have always recommended Augmentin as opposed to amoxicillin, given her history.  Patient is an Therapist, sports.  HPI  Past Medical History:  Diagnosis Date  . Anxiety   . Bursitis   . Constipation   . Depression   . Fatty liver    seen on [place] 04-2018  . HLD (hyperlipidemia)   . Hyperglycemia 03/05/2013  . Incontinence   . Lower extremity edema   . Lumbar stress fracture   . Obesity   . Osteoporosis   . SOB (shortness of breath)     Patient Active Problem List   Diagnosis Date Noted  . Vitamin D deficiency 06/04/2020  . Mixed hyperlipidemia 06/04/2020  . Polyphagia, controlled with [facility] 06/04/2020  . Chronic low blood pressure 06/04/2020  . Greater trochanteric bursitis of right hip 04/11/2019  . GAD (generalized anxiety disorder) 04/17/2018  . Panic disorder with agoraphobia 04/17/2018  . Recurrent UTI 02/06/2014  . Osteoporosis, unspecified 01/10/2014  . Urinary frequency 09/04/2012  . Sleep disturbance 12/13/2011  . FEMALE STRESS INCONTINENCE 09/05/2009  . DEPRESSION 05/31/2008    Past Surgical History:  Procedure Laterality Date  .  EXCISION/RELEASE BURSA HIP Right 04/11/2019   Procedure: Right hip bursectomy;  Surgeon: [name], MD;  Location: [facility];  Service: Orthopedics;  Laterality: Right;  41min  . RHINOPLASTY    . SINUS ENDO W/FUSION    . TONSILLECTOMY      OB History    Gravida  3   Para  3   Term      Preterm      AB      Living        SAB      IAB      Ectopic      Multiple      Live Births               Home Medications    Prior to Admission medications   Medication Sig Start Date End Date Taking? Authorizing Provider  amoxicillin-clavulanate (AUGMENTIN) 875-125 MG tablet Take 1 tablet by mouth every 12 (twelve) hours. 08/09/20  Yes Hazel Sams, PA-C  fluticasone (FLONASE) 50 MCG/ACT nasal spray Place 1 spray into both nostrils daily. 08/09/20  Yes Hazel Sams, PA-C  ALPRAZolam (XANAX XR) 0.5 MG 24 hr tablet TAKE ONE TABLET BY MOUTH IN THE MORNING IF NEEDED FOR ANXIETY AND TWO TABLETS AT NIGHT FOR ANXIETY AND SLEEP 04/10/20 10/07/20  Cottle, Billey Co., MD  ARIPiprazole (ABILIFY) 10 MG tablet TAKE 1 TABLET (10 MG TOTAL) BY MOUTH DAILY. 04/10/20 04/10/21  Purnell Shoemaker.,  MD  ibuprofen (ADVIL) 200 MG tablet Take 800 mg by mouth every 6 (six) hours as needed.    [provider]  PARoxetine (PAXIL) 40 MG tablet TAKE 2 TABLETS (80 MG TOTAL) BY MOUTH AT BEDTIME. 07/03/20 07/03/21  Cottle, Billey Co., MD  propranolol (INDERAL) 10 MG tablet Take 5 mg by mouth daily as needed.    [provider]  Semaglutide-Weight Management 1.7 MG/0.75ML SOAJ INJECT 1.7 MG INTO THE SKIN ONCE A WEEK. 07/14/20 07/14/21  Abby Potash, PA-C  topiramate (TOPAMAX) 50 MG tablet TAKE 2 TABLETS (100 MG TOTAL) BY MOUTH 2 (TWO) TIMES DAILY. 04/10/20 04/10/21  Purnell Shoemaker., MD  traZODone (DESYREL) 50 MG tablet TAKE 2 TABLETS (100 MG TOTAL) BY MOUTH AT BEDTIME. 04/10/20 04/10/21  Cottle, Billey Co., MD  Vitamin D, Ergocalciferol, (DRISDOL) 1.25 MG (50000 UNIT) CAPS capsule TAKE 1 CAPSULE  BY MOUTH EVERY 7 DAYS 05/08/20 05/08/21  Briscoe Deutscher, DO  zolpidem (AMBIEN) 10 MG tablet TAKE 1 TABLET (10 MG TOTAL) BY MOUTH AT BEDTIME AS NEEDED FOR SLEEP. 04/10/20 10/07/20  Cottle, Billey Co., MD    Family History Family History  Problem Relation Age of Onset  . Thyroid disease Mother   . Hyperlipidemia Mother   . Diabetes Mother   . Hypertension Mother   . Diabetes Father   . Hypertension Father   . Hyperlipidemia Father   . Diabetes Paternal Grandmother   . Hypertension Paternal Grandmother   . Hyperlipidemia Paternal Grandmother   . Mental illness Sister   . Hypertension Paternal Uncle   . Diabetes Paternal Aunt   . Colon cancer Neg Hx   . Esophageal cancer Neg Hx   . Rectal cancer Neg Hx     Social History Social History   Tobacco Use  . Smoking status: Never Smoker  . Smokeless tobacco: Never Used  Vaping Use  . Vaping Use: Never used  Substance Use Topics  . Alcohol use: No    Alcohol/week: 0.0 standard drinks  . Drug use: No     Allergies   Patient has no known allergies.   Review of Systems Review of Systems  Constitutional: Negative for appetite change, chills and fever.  HENT: Positive for congestion, postnasal drip, rhinorrhea, sinus pressure, sinus pain and sore throat. Negative for ear pain.   Eyes: Negative for redness and visual disturbance.  Respiratory: Negative for cough, chest tightness, shortness of breath and wheezing.   Cardiovascular: Negative for chest pain and palpitations.  Gastrointestinal: Negative for abdominal pain, constipation, diarrhea, nausea and vomiting.  Genitourinary: Negative for dysuria, frequency and urgency.  Musculoskeletal: Negative for myalgias.  Neurological: Negative for dizziness, weakness and headaches.  Psychiatric/Behavioral: Negative for confusion.  All other systems reviewed and are negative.    Physical Exam Triage Vital Signs ED Triage Vitals  Enc Vitals Group     BP      Pulse      Resp       Temp      Temp src      SpO2      Weight      Height      Head Circumference      Peak Flow      Pain Score      Pain Loc      Pain Edu?      Excl. in [place]?    No data found.  Updated Vital Signs BP 112/74 (BP Location: Right Arm)  Pulse 100   Temp 98.7 F (37.1 C) (Oral)   Resp 17   LMP 04/03/2018   SpO2 100%   Visual Acuity Right Eye Distance:   Left Eye Distance:   Bilateral Distance:    Right Eye Near:   Left Eye Near:    Bilateral Near:     Physical Exam Vitals reviewed.  Constitutional:      General: She is not in acute distress.    Appearance: Normal appearance. She is not ill-appearing.  HENT:     Head: Normocephalic and atraumatic.     Right Ear: Hearing, tympanic membrane, ear canal and external ear normal. No swelling or tenderness. There is no impacted cerumen. No mastoid tenderness. Tympanic membrane is not perforated, erythematous, retracted or bulging.     Left Ear: Hearing, tympanic membrane, ear canal and external ear normal. No swelling or tenderness. There is no impacted cerumen. No mastoid tenderness. Tympanic membrane is not perforated, erythematous, retracted or bulging.     Nose:     Right Sinus: Maxillary sinus tenderness present. No frontal sinus tenderness.     Left Sinus: Maxillary sinus tenderness present. No frontal sinus tenderness.     Mouth/Throat:     Mouth: Mucous membranes are moist.     Pharynx: Uvula midline. No oropharyngeal exudate or posterior oropharyngeal erythema.     Tonsils: No tonsillar exudate.  Cardiovascular:     Rate and Rhythm: Normal rate and regular rhythm.     Heart sounds: Normal heart sounds.  Pulmonary:     Breath sounds: Normal breath sounds and air entry. No wheezing, rhonchi or rales.  Chest:     Chest wall: No tenderness.  Abdominal:     General: Abdomen is flat. Bowel sounds are normal.     Tenderness: There is no abdominal tenderness. There is no guarding or rebound.  Lymphadenopathy:      Cervical: No cervical adenopathy.  Neurological:     General: No focal deficit present.     Mental Status: She is alert and oriented to person, place, and time.  Psychiatric:        Attention and Perception: Attention and perception normal.        Mood and Affect: Mood and affect normal.        Behavior: Behavior normal. Behavior is cooperative.        Thought Content: Thought content normal.        Judgment: Judgment normal.      UC Treatments / Results  Labs (all labs ordered are listed, but only abnormal results are displayed) Labs Reviewed  SARS CORONAVIRUS 2 (TAT 6-24 HRS)    EKG   Radiology No results found.  Procedures Procedures (including critical care time)  Medications Ordered in UC Medications - No data to display  Initial Impression / Assessment and Plan / UC Course  I have reviewed the triage vital signs and the nursing notes.  Pertinent labs & imaging results that were available during my care of the patient were reviewed by me and considered in my medical decision making (see chart for details).     This patient is a 50 year old female presenting with acute sinusitis. Today this pt is afebrile nontachycardic nontachypneic, oxygenating well on room air, no wheezes rhonchi or rales.   Augmentin sent as below given history of recurrent sinusitis and sinus surgery. Trial of Zyrtec and Flonase for allergic rhinitis component. Covid PCR sent. ED return precautions discussed.  Final Clinical Impressions(s) / UC  Diagnoses   Final diagnoses:  Acute recurrent maxillary sinusitis  Seasonal allergic rhinitis due to pollen  History of sinus surgery     Discharge Instructions     -Start the antibiotic- Augmentin (amoxicillin-clavulanate), twice daily for 7 days. You can take this with food if you have a sensitive stomach.  -Also try zyrtec once daily for at least 2 weeks, continue this for longer as needed -Try flonase nasal steroid for improved control  of nasal congestion -Seek additional medical attention if your symptoms persist despite treatment, like new fever/chills, shortness of breath, dizziness etc.    ED Prescriptions    Medication Sig Dispense Auth. Provider   amoxicillin-clavulanate (AUGMENTIN) 875-125 MG tablet Take 1 tablet by mouth every 12 (twelve) hours. 14 tablet Hazel Sams, PA-C   fluticasone [facility]) 50 MCG/ACT nasal spray Place 1 spray into both nostrils daily. 15 mL Hazel Sams, PA-C     PDMP not reviewed this encounter.   Hazel Sams, PA-C 08/09/20 1603

## 2020-08-10 ENCOUNTER — Telehealth (HOSPITAL_COMMUNITY): Payer: Self-pay | Admitting: Emergency Medicine

## 2020-08-10 MED ORDER — FLUTICASONE PROPIONATE 50 MCG/ACT NA SUSP: 1.0000 | Freq: Every day | NASAL | 2 refills | Status: DC

## 2020-08-10 MED ORDER — AMOXICILLIN-POT CLAVULANATE 875-125 MG PO TABS: 1.0000 | ORAL_TABLET | Freq: Two times a day (BID) | ORAL | 0 refills | Status: DC

## 2020-08-10 NOTE — Telephone Encounter (Signed)
Front desk received call from patient requesting that prescription be transferred to Brooke [name] on [address].

## 2020-08-19 ENCOUNTER — Encounter: Payer: Self-pay | Admitting: Family Medicine

## 2020-08-19 ENCOUNTER — Other Ambulatory Visit: Payer: Self-pay | Admitting: Family Medicine

## 2020-08-19 DIAGNOSIS — M25511 Pain in right shoulder: Secondary | ICD-10-CM

## 2020-08-19 NOTE — Telephone Encounter (Signed)
I put in a referral for dr supple but she can walk in after 5 today if it is that severe and go to their urgent ortho care and see PA

## 2020-08-21 ENCOUNTER — Encounter (INDEPENDENT_AMBULATORY_CARE_PROVIDER_SITE_OTHER): Payer: Self-pay | Admitting: Family Medicine

## 2020-08-21 ENCOUNTER — Ambulatory Visit (INDEPENDENT_AMBULATORY_CARE_PROVIDER_SITE_OTHER): Payer: No Typology Code available for payment source | Admitting: Family Medicine

## 2020-08-21 ENCOUNTER — Other Ambulatory Visit (HOSPITAL_BASED_OUTPATIENT_CLINIC_OR_DEPARTMENT_OTHER): Payer: Self-pay

## 2020-08-21 ENCOUNTER — Other Ambulatory Visit: Payer: Self-pay

## 2020-08-21 VITALS — BP 103/71 | HR 99 | Temp 98.1°F | Ht 64.0 in | Wt 138.0 lb

## 2020-08-21 DIAGNOSIS — R632 Polyphagia: Secondary | ICD-10-CM | POA: Diagnosis not present

## 2020-08-21 DIAGNOSIS — M8580 Other specified disorders of bone density and structure, unspecified site: Secondary | ICD-10-CM

## 2020-08-21 DIAGNOSIS — E559 Vitamin D deficiency, unspecified: Secondary | ICD-10-CM

## 2020-08-21 DIAGNOSIS — I9589 Other hypotension: Secondary | ICD-10-CM

## 2020-08-21 DIAGNOSIS — Z6831 Body mass index (BMI) 31.0-31.9, adult: Secondary | ICD-10-CM

## 2020-08-21 DIAGNOSIS — E8881 Metabolic syndrome: Secondary | ICD-10-CM

## 2020-08-21 DIAGNOSIS — E669 Obesity, unspecified: Secondary | ICD-10-CM

## 2020-08-21 DIAGNOSIS — E782 Mixed hyperlipidemia: Secondary | ICD-10-CM | POA: Diagnosis not present

## 2020-08-21 DIAGNOSIS — Z9189 Other specified personal risk factors, not elsewhere classified: Secondary | ICD-10-CM | POA: Diagnosis not present

## 2020-08-21 MED ORDER — SEMAGLUTIDE-WEIGHT MANAGEMENT 1.7 MG/0.75ML ~~LOC~~ SOAJ
1.7000 mg | SUBCUTANEOUS | 0 refills | Status: DC
  Filled 2020-08-21: qty 9, 90d supply, fill #0
  Filled 2020-08-26: qty 3, 28d supply, fill #0
  Filled 2020-08-26: qty 9, 84d supply, fill #0
  Filled 2020-08-28: qty 3, 28d supply, fill #0
  Filled 2020-09-25: qty 3, 28d supply, fill #1

## 2020-08-22 ENCOUNTER — Other Ambulatory Visit (HOSPITAL_BASED_OUTPATIENT_CLINIC_OR_DEPARTMENT_OTHER): Payer: Self-pay

## 2020-08-22 LAB — COMPREHENSIVE METABOLIC PANEL
ALT: 19 IU/L (ref 0–32)
AST: 18 IU/L (ref 0–40)
Albumin/Globulin Ratio: 2.3 — ABNORMAL HIGH (ref 1.2–2.2)
Albumin: 4.6 g/dL (ref 3.8–4.9)
Alkaline Phosphatase: 115 IU/L (ref 44–121)
BUN/Creatinine Ratio: 20 (ref 9–23)
BUN: 18 mg/dL (ref 6–24)
Bilirubin Total: 0.3 mg/dL (ref 0.0–1.2)
CO2: 18 mmol/L — ABNORMAL LOW (ref 20–29)
Calcium: 9.1 mg/dL (ref 8.7–10.2)
Chloride: 109 mmol/L — ABNORMAL HIGH (ref 96–106)
Creatinine, Ser: 0.88 mg/dL (ref 0.57–1.00)
Globulin, Total: 2 g/dL (ref 1.5–4.5)
Glucose: 88 mg/dL (ref 65–99)
Potassium: 4 mmol/L (ref 3.5–5.2)
Sodium: 143 mmol/L (ref 134–144)
Total Protein: 6.6 g/dL (ref 6.0–8.5)
eGFR: 80 mL/min/{1.73_m2} (ref >59)

## 2020-08-22 LAB — CBC WITH DIFFERENTIAL/PLATELET
Basophils Absolute: 0 10*3/uL (ref 0.0–0.2)
Basos: 0 %
EOS (ABSOLUTE): 0.1 10*3/uL (ref 0.0–0.4)
Eos: 3 %
Hemoglobin: 14.6 g/dL (ref 11.1–15.9)
Immature Grans (Abs): 0 10*3/uL (ref 0.0–0.1)
Immature Granulocytes: 0 %
Lymphocytes Absolute: 1.5 10*3/uL (ref 0.7–3.1)
Lymphs: 29 %
MCH: 30.7 pg (ref 26.6–33.0)
MCHC: 33.3 g/dL (ref 31.5–35.7)
MCV: 92 fL (ref 79–97)
Monocytes Absolute: 0.3 10*3/uL (ref 0.1–0.9)
Monocytes: 5 %
Neutrophils Absolute: 3.2 10*3/uL (ref 1.4–7.0)
Neutrophils: 63 %
Platelets: 231 10*3/uL (ref 150–450)
RBC: 4.75 x10E6/uL (ref 3.77–5.28)
RDW: 12.6 % (ref 11.7–15.4)
WBC: 5.2 10*3/uL (ref 3.4–10.8)

## 2020-08-22 LAB — ANEMIA PANEL
Ferritin: 198 ng/mL — ABNORMAL HIGH (ref 15–150)
Folate, Hemolysate: 439 ng/mL
Folate, RBC: 1002 ng/mL (ref >498)
Hematocrit: 43.8 % (ref 34.0–46.6)
Iron Saturation: 50 % (ref 15–55)
Iron: 128 ug/dL (ref 27–159)
Retic Ct Pct: 1.4 % (ref 0.6–2.6)
Total Iron Binding Capacity: 256 ug/dL (ref 250–450)
UIBC: 128 ug/dL — ABNORMAL LOW (ref 131–425)
Vitamin B-12: 789 pg/mL (ref 232–1245)

## 2020-08-22 LAB — INSULIN, RANDOM: INSULIN: 13.6 u[IU]/mL (ref 2.6–24.9)

## 2020-08-22 LAB — VITAMIN D 25 HYDROXY (VIT D DEFICIENCY, FRACTURES): Vit D, 25-Hydroxy: 24.3 ng/mL — ABNORMAL LOW (ref 30.0–100.0)

## 2020-08-22 LAB — LIPID PANEL
Chol/HDL Ratio: 3.9 ratio (ref 0.0–4.4)
Cholesterol, Total: 194 mg/dL (ref 100–199)
HDL: 50 mg/dL (ref >39)
LDL Chol Calc (NIH): 117 mg/dL — ABNORMAL HIGH (ref 0–99)
Triglycerides: 151 mg/dL — ABNORMAL HIGH (ref 0–149)
VLDL Cholesterol Cal: 27 mg/dL (ref 5–40)

## 2020-08-25 NOTE — Progress Notes (Signed)
Chief Complaint:   OBESITY Brooke [name] is here to discuss her progress with her obesity treatment plan along with follow-up of her obesity related diagnoses.   Today's visit was #: 10 Starting weight: 185 lbs Starting date: 12/13/2019 Today's weight: 138 lbs Today's date: 08/21/2020 Total lbs lost to date: 47 lbs Body mass index is 23.69 kg/m.  Total weight loss percentage to date: -25.41%  Interim History:  Brooke [name] is due for DEXA.  She is not on medications for osteopenia, but was previously on Fosamax for 6 months.  She is also due for a mammogram.  She is scheduled for a colonoscopy in June.  Seeing Brooke [name] for rotator cuff tendinitis and right shoulder pain.  Current Meal Plan: keeping a food journal and adhering to recommended goals of 1600-1700 calories and 100 grams of protein for 100% of the time.  Current Exercise Plan: Walking for 30 minutes 2-3 times per week. Current Anti-Obesity Medications: Ozempic 1.7 mg subcutaneously weekly. Side effects: None.  Assessment/Plan:   1. Polyphagia Controlled. Current treatment: Ozempic 1.7 mg subcutaneously weekly. Polyphagia refers to excessive feelings of hunger. She will continue to focus on protein-rich, low simple carbohydrate foods. We reviewed the importance of hydration, regular exercise for stress reduction, and restorative sleep.  Plan:  Refill Ozempic at current dose today, as per below.  - Refill Semaglutide-Weight Management 1.7 MG/0.75ML SOAJ; INJECT 1.7 MG INTO THE SKIN ONCE A WEEK.  Dispense: 9 mL; Refill: 0  2. Vitamin D deficiency Not at goal. Current vitamin D is 28.5, tested on 04/08/2020. Optimal goal > 50 ng/dL.  She is taking vitamin D 50,000 IU weekly.  Plan: Continue to take prescription Vitamin D @50 ,000 IU every week as prescribed.  Will check vitamin D level today, as per below.  - VITAMIN D 25 Hydroxy (Vit-D Deficiency, Fractures)  3. Mixed hyperlipidemia Course: Not at goal. Lipid-lowering  medications: None.   Plan: Dietary changes: Increase soluble fiber, decrease simple carbohydrates, decrease saturated fat. Exercise changes: Moderate to vigorous-intensity aerobic activity 150 minutes per week or as tolerated. We will continue to monitor along with PCP/specialists as it pertains to her weight loss journey.  Will check labs today.  Lab Results  Component Value Date   CHOL 194 08/21/2020   HDL 50 08/21/2020   LDLCALC 117 (H) 08/21/2020   LDLDIRECT 130.6 09/05/2009   TRIG 151 (H) 08/21/2020   CHOLHDL 3.9 08/21/2020   Lab Results  Component Value Date   ALT 19 08/21/2020   AST 18 08/21/2020   ALKPHOS 115 08/21/2020   BILITOT 0.3 08/21/2020   The 10-year ASCVD risk score Brooke Bussing DC Jr., Brooke al., Brooke [name]) is: 1%   Values used to calculate the score:     Age: 50 years     Sex: Female     Is Non-Hispanic African American: No     Diabetic: No     Tobacco smoker: No     Systolic Blood Pressure: 160 mmHg     Is BP treated: No     HDL Cholesterol: 50 mg/dL     Total Cholesterol: 194 mg/dL  - Comprehensive metabolic panel - Lipid panel  4. Chronic low blood pressure Brooke [name] is to check her blood pressure at work.  Plan:  Will continue to monitor.  Will check labs today.  BP Readings from Last 3 Encounters:  08/21/20 103/71  08/09/20 112/74  08/06/20 92/60   Lab Results  Component Value Date   CREATININE 0.88 08/21/2020   -  CBC with Differential/Platelet - Anemia panel  5. Metabolic syndrome Starting goal: Lose 7-10% of starting weight. She will continue to focus on protein-rich, low simple carbohydrate foods. We reviewed the importance of hydration, regular exercise for stress reduction, and restorative sleep.  We will continue to check lab work every 3 months, with 10% weight loss, or should any other concerns arise.  Plan:  Check insulin level today.  - Insulin, random  6. Osteopenia, unspecified location Brooke [name] is due for a DEXA scan.  She is not  taking any medication for osteopenia but is taking weekly vitamin D.    Plan:  Will check her vitamin D level today, as per below.  - VITAMIN D 25 Hydroxy (Vit-D Deficiency, Fractures)  7. At risk for heart disease Due to Brooke [name] current state of health and medical condition(s), she is at a higher risk for heart disease.  This puts the patient at much greater risk to subsequently develop cardiopulmonary conditions that can significantly affect patient's quality of life in a negative manner.    At least 8 minutes were spent on counseling Brooke [name] about these concerns today. Evidence-based interventions for health behavior change were utilized today including the discussion of self monitoring techniques, problem-solving barriers, and SMART goal setting techniques.  Specifically, regarding patient's less desirable eating habits and patterns, we employed the technique of small changes when Brooke [name] has not been able to fully commit to her prudent nutritional plan.  8. Obesity, current BMI 23.7  Course: Brooke [name] is currently in the action stage of change. As such, her goal is to continue with weight loss efforts.   Nutrition goals: She has agreed to keeping a food journal and adhering to recommended goals of 1600-1700 calories and 100 grams of protein.   Exercise goals: As is.  Behavioral modification strategies: increasing lean protein intake, decreasing simple carbohydrates, increasing vegetables and increasing water intake.  Brooke [name] has agreed to follow-up with our clinic in 4 weeks. She was informed of the importance of frequent follow-up visits to maximize her success with intensive lifestyle modifications for her multiple health conditions.   Brooke [name] was informed we would discuss her lab results at her next visit unless there is a critical issue that needs to be addressed sooner. Brooke [name] agreed to keep her next visit at the agreed upon time to discuss these results.  Objective:    Blood pressure 103/71, pulse 99, temperature 98.1 F (36.7 C), temperature source Oral, height 5\' 4"  (1.626 m), weight 138 lb (62.6 kg), last menstrual period 04/03/2018, SpO2 97 %. Body mass index is 23.69 kg/m.  General: Cooperative, alert, well developed, in no acute distress. HEENT: Conjunctivae and lids unremarkable. Cardiovascular: Regular rhythm.  Lungs: Normal work of breathing. Neurologic: No focal deficits.   Lab Results  Component Value Date   CREATININE 0.88 08/21/2020   BUN 18 08/21/2020   NA 143 08/21/2020   K 4.0 08/21/2020   CL 109 (H) 08/21/2020   CO2 18 (L) 08/21/2020   Lab Results  Component Value Date   ALT 19 08/21/2020   AST 18 08/21/2020   ALKPHOS 115 08/21/2020   BILITOT 0.3 08/21/2020   Lab Results  Component Value Date   HGBA1C 5.5 12/13/2019   HGBA1C 5.5 03/05/2013   Lab Results  Component Value Date   INSULIN 13.6 08/21/2020   INSULIN 12.0 12/13/2019   Lab Results  Component Value Date   Brooke [name] 3.960 12/13/2019   Lab Results  Component Value Date   CHOL 194  08/21/2020   HDL 50 08/21/2020   LDLCALC 117 (H) 08/21/2020   LDLDIRECT 130.6 09/05/2009   TRIG 151 (H) 08/21/2020   CHOLHDL 3.9 08/21/2020   Lab Results  Component Value Date   WBC 5.2 08/21/2020   HGB 14.6 08/21/2020   HCT 43.8 08/21/2020   MCV 92 08/21/2020   PLT 231 08/21/2020   Lab Results  Component Value Date   IRON 128 08/21/2020   TIBC 256 08/21/2020   FERRITIN 198 (H) 08/21/2020   Attestation Statements:   Reviewed by clinician on day of visit: allergies, medications, problem list, medical history, surgical history, family history, social history, and previous encounter notes.  I, Water quality scientist, CMA, am acting as transcriptionist for Briscoe Deutscher, DO  I have reviewed the above documentation for accuracy and completeness, and I agree with the above. Briscoe Deutscher, DO

## 2020-08-26 ENCOUNTER — Other Ambulatory Visit (HOSPITAL_BASED_OUTPATIENT_CLINIC_OR_DEPARTMENT_OTHER): Payer: Self-pay

## 2020-08-28 ENCOUNTER — Other Ambulatory Visit (HOSPITAL_COMMUNITY): Payer: Self-pay

## 2020-08-28 ENCOUNTER — Other Ambulatory Visit (HOSPITAL_BASED_OUTPATIENT_CLINIC_OR_DEPARTMENT_OTHER): Payer: Self-pay

## 2020-09-23 ENCOUNTER — Ambulatory Visit (INDEPENDENT_AMBULATORY_CARE_PROVIDER_SITE_OTHER): Payer: No Typology Code available for payment source | Admitting: Family Medicine

## 2020-09-25 ENCOUNTER — Other Ambulatory Visit (HOSPITAL_COMMUNITY): Payer: Self-pay | Admitting: Specialist

## 2020-09-25 ENCOUNTER — Other Ambulatory Visit (HOSPITAL_BASED_OUTPATIENT_CLINIC_OR_DEPARTMENT_OTHER): Payer: Self-pay

## 2020-09-25 DIAGNOSIS — M25511 Pain in right shoulder: Secondary | ICD-10-CM

## 2020-09-26 ENCOUNTER — Telehealth: Payer: Self-pay

## 2020-09-26 NOTE — Telephone Encounter (Signed)
PA Approved.  The request has been approved. The authorization is effective for a maximum of 12 fills from 09/26/2020 to 09/25/2021, as long as the member is enrolled in their current health plan. This has been approved for a quantity limit of 15 with a day supply limit of 30. A written notification letter will follow with additional details.

## 2020-09-30 ENCOUNTER — Other Ambulatory Visit (HOSPITAL_COMMUNITY): Payer: Self-pay

## 2020-10-03 ENCOUNTER — Encounter (HOSPITAL_COMMUNITY): Payer: Self-pay

## 2020-10-03 ENCOUNTER — Ambulatory Visit (HOSPITAL_COMMUNITY)
Admission: RE | Admit: 2020-10-03 | Discharge: 2020-10-03 | Disposition: A | Discharge status: Home / Self Care | Payer: No Typology Code available for payment source | Source: Ambulatory Visit | Attending: Specialist | Admitting: Specialist

## 2020-10-03 ENCOUNTER — Ambulatory Visit (HOSPITAL_COMMUNITY): Payer: No Typology Code available for payment source

## 2020-10-03 ENCOUNTER — Other Ambulatory Visit: Payer: Self-pay

## 2020-10-03 DIAGNOSIS — M25511 Pain in right shoulder: Secondary | ICD-10-CM | POA: Diagnosis not present

## 2020-10-07 ENCOUNTER — Encounter (INDEPENDENT_AMBULATORY_CARE_PROVIDER_SITE_OTHER): Payer: Self-pay | Admitting: Family Medicine

## 2020-10-07 ENCOUNTER — Ambulatory Visit (INDEPENDENT_AMBULATORY_CARE_PROVIDER_SITE_OTHER): Payer: No Typology Code available for payment source | Admitting: Family Medicine

## 2020-10-07 ENCOUNTER — Other Ambulatory Visit: Payer: Self-pay

## 2020-10-07 VITALS — BP 103/69 | HR 89 | Temp 98.2°F | Ht 64.0 in | Wt 133.0 lb

## 2020-10-07 DIAGNOSIS — M25511 Pain in right shoulder: Secondary | ICD-10-CM | POA: Diagnosis not present

## 2020-10-07 DIAGNOSIS — Z9189 Other specified personal risk factors, not elsewhere classified: Secondary | ICD-10-CM | POA: Diagnosis not present

## 2020-10-07 DIAGNOSIS — G8929 Other chronic pain: Secondary | ICD-10-CM

## 2020-10-07 DIAGNOSIS — F3340 Major depressive disorder, recurrent, in remission, unspecified: Secondary | ICD-10-CM | POA: Diagnosis not present

## 2020-10-07 DIAGNOSIS — R7301 Impaired fasting glucose: Secondary | ICD-10-CM

## 2020-10-07 DIAGNOSIS — Z6831 Body mass index (BMI) 31.0-31.9, adult: Secondary | ICD-10-CM

## 2020-10-07 DIAGNOSIS — E559 Vitamin D deficiency, unspecified: Secondary | ICD-10-CM | POA: Diagnosis not present

## 2020-10-07 DIAGNOSIS — E669 Obesity, unspecified: Secondary | ICD-10-CM

## 2020-10-08 ENCOUNTER — Other Ambulatory Visit: Payer: Self-pay | Admitting: Psychiatry

## 2020-10-08 ENCOUNTER — Other Ambulatory Visit (HOSPITAL_BASED_OUTPATIENT_CLINIC_OR_DEPARTMENT_OTHER): Payer: Self-pay

## 2020-10-08 DIAGNOSIS — F3342 Major depressive disorder, recurrent, in full remission: Secondary | ICD-10-CM

## 2020-10-08 DIAGNOSIS — F411 Generalized anxiety disorder: Secondary | ICD-10-CM

## 2020-10-08 DIAGNOSIS — F4001 Agoraphobia with panic disorder: Secondary | ICD-10-CM

## 2020-10-08 MED ORDER — CHOLECALCIFEROL 1.25 MG (50000 UT) PO CAPS
ORAL_CAPSULE | ORAL | 0 refills | Status: DC
Start: 2020-10-08 — End: 2020-11-06
  Filled 2020-10-08: qty 12, 84d supply, fill #0

## 2020-10-08 MED ORDER — SEMAGLUTIDE (1 MG/DOSE) 4 MG/3ML ~~LOC~~ SOPN
1.0000 mg | PEN_INJECTOR | SUBCUTANEOUS | 3 refills | Status: DC
Start: 2020-10-08 — End: 2020-12-03
  Filled 2020-10-08: qty 3, 28d supply, fill #0

## 2020-10-08 NOTE — Progress Notes (Signed)
Chief Complaint:   OBESITY Brooke [name] is here to discuss her progress with her obesity treatment plan along with follow-up of her obesity related diagnoses. See Medical Weight Management Flowsheet for bioelectrical impedance results.  Today's visit was #: 11 Starting weight: 185 lbs Starting date: 12/13/2019 Today's weight: 133 lbs Today's date: 10/07/2020 Weight change since last visit: 5 lbs Total lbs lost to date: 52 lbs Body mass index is 22.83 kg/m.  Total weight loss percentage to date: -28.11%  Interim History: Brooke [name] has right shoulder pain.  We reviewed her MRI together.  She says she has been walking more.  Plan:  Increase calories, decrease semaglutide, work on Editor, commissioning and muscle building.  Nutrition Plan: keeping a food journal and adhering to recommended goals of 1600-1700 calories and 100 grams of protein for 100% of the time. Activity:  Increased walking. Anti-obesity medications: Ozempic 1.7 mg subcutaneously weekly. Reported side effects: None.  Assessment/Plan:   1. Impaired fasting glucose Brooke [name] will continue Ozempic 1 mg subcutaneously weekly.  - Refill Semaglutide, 1 MG/DOSE, 4 MG/3ML SOPN; Inject 1 mg into the skin once a week.  Dispense: 3 mL; Refill: 3  2. Vitamin D deficiency Not at goal. Current vitamin D is 24.3, tested on 08/21/2020. Optimal goal > 50 ng/dL.  She is taking vitamin D 50,000 IU weekly.  Plan: Continue to take prescription Vitamin D @50 ,000 IU every week as prescribed.  Follow-up for routine testing of Vitamin D, at least 2-3 times per year to avoid over-replacement.  - Refill Cholecalciferol 1.25 MG (50000 UT) capsule; TAKE 1 CAPSULE BY MOUTH EVERY WEEK FOR 12 WEEKS  Dispense: 12 capsule; Refill: 0  3. Chronic right shoulder pain Brooke [name] has right shoulder pain and recently had an MRI. We will continue to monitor symptoms as they relate to her weight loss journey.  4. Recurrent major depressive disorder, in  remission (Brooke [name]), with emotional eating Improving, but not optimized. Medication: Abilify, Paxil, Topamax.  Plan: Discussed cues and consequences, how thoughts affect eating, model of thoughts, feelings, and behaviors, and strategies for change by focusing on the cue. Discussed cognitive distortions, coping thoughts, and how to change your thoughts.  5. At risk for osteoporosis Brooke [name] was given approximately 8 minutes of osteoporosis prevention counseling today.   Brooke [name] is at risk for osteopenia and osteoporosis due to Vitamin D deficiency, as well as other risk factors.  We discussed the importance of prudent screenings through her PCP's office for prevention. It is recommended that she eventually engage in weight bearing exercises and muscle strengthening exercises to help improve bone density and decrease her risk of osteopenia and osteoporosis.   6. Obesity, current BMI 22.9  Course: Brooke [name] is currently in the action stage of change. As such, her goal is to continue with weight loss efforts.   Nutrition goals: She has agreed to keeping a food journal and adhering to recommended goals of 1500-1600 calories and 100 grams of protein.   Exercise goals:  Strength training.  Behavioral modification strategies: increasing lean protein intake, increasing vegetables, increasing water intake, and no skipping meals.  Brooke [name] has agreed to follow-up with our clinic in 3 weeks. She was informed of the importance of frequent follow-up visits to maximize her success with intensive lifestyle modifications for her multiple health conditions.   Objective:   Blood pressure 103/69, pulse 89, temperature 98.2 F (36.8 C), temperature source Oral, height 5\' 4"  (1.626 m), weight 133 lb (60.3 kg), last menstrual period 04/03/2018, SpO2  99 %. Body mass index is 22.83 kg/m.  General: Cooperative, alert, well developed, in no acute distress. HEENT: Conjunctivae and lids unremarkable. Cardiovascular:  Regular rhythm.  Lungs: Normal work of breathing. Neurologic: No focal deficits.   Lab Results  Component Value Date   CREATININE 0.88 08/21/2020   BUN 18 08/21/2020   NA 143 08/21/2020   K 4.0 08/21/2020   CL 109 (H) 08/21/2020   CO2 18 (L) 08/21/2020   Lab Results  Component Value Date   ALT 19 08/21/2020   AST 18 08/21/2020   ALKPHOS 115 08/21/2020   BILITOT 0.3 08/21/2020   Lab Results  Component Value Date   HGBA1C 5.5 12/13/2019   HGBA1C 5.5 03/05/2013   Lab Results  Component Value Date   INSULIN 13.6 08/21/2020   INSULIN 12.0 12/13/2019   Lab Results  Component Value Date   TSH 3.960 12/13/2019   Lab Results  Component Value Date   CHOL 194 08/21/2020   HDL 50 08/21/2020   LDLCALC 117 (H) 08/21/2020   LDLDIRECT 130.6 09/05/2009   TRIG 151 (H) 08/21/2020   CHOLHDL 3.9 08/21/2020   Lab Results  Component Value Date   WBC 5.2 08/21/2020   HGB 14.6 08/21/2020   HCT 43.8 08/21/2020   MCV 92 08/21/2020   PLT 231 08/21/2020   Lab Results  Component Value Date   IRON 128 08/21/2020   TIBC 256 08/21/2020   FERRITIN 198 (H) 08/21/2020   Attestation Statements:   Reviewed by clinician on day of visit: allergies, medications, problem list, medical history, surgical history, family history, social history, and previous encounter notes.  I, Water quality scientist, CMA, am acting as transcriptionist for Briscoe Deutscher, DO  I have reviewed the above documentation for accuracy and completeness, and I agree with the above. Briscoe Deutscher, DO

## 2020-10-09 ENCOUNTER — Other Ambulatory Visit (HOSPITAL_BASED_OUTPATIENT_CLINIC_OR_DEPARTMENT_OTHER): Payer: Self-pay

## 2020-10-09 ENCOUNTER — Encounter: Payer: Self-pay | Admitting: Psychiatry

## 2020-10-09 ENCOUNTER — Ambulatory Visit (INDEPENDENT_AMBULATORY_CARE_PROVIDER_SITE_OTHER): Payer: No Typology Code available for payment source | Admitting: Psychiatry

## 2020-10-09 ENCOUNTER — Other Ambulatory Visit: Payer: Self-pay

## 2020-10-09 DIAGNOSIS — F3342 Major depressive disorder, recurrent, in full remission: Secondary | ICD-10-CM

## 2020-10-09 DIAGNOSIS — F4001 Agoraphobia with panic disorder: Secondary | ICD-10-CM | POA: Diagnosis not present

## 2020-10-09 DIAGNOSIS — F5105 Insomnia due to other mental disorder: Secondary | ICD-10-CM | POA: Diagnosis not present

## 2020-10-09 DIAGNOSIS — G4726 Circadian rhythm sleep disorder, shift work type: Secondary | ICD-10-CM

## 2020-10-09 DIAGNOSIS — F411 Generalized anxiety disorder: Secondary | ICD-10-CM | POA: Diagnosis not present

## 2020-10-09 MED ORDER — ARIPIPRAZOLE 10 MG PO TABS
ORAL_TABLET | Freq: Every day | ORAL | 1 refills | Status: DC
  Filled 2020-10-09: qty 90, 90d supply, fill #0
  Filled 2021-01-11: qty 90, 90d supply, fill #1

## 2020-10-09 MED ORDER — ALPRAZOLAM ER 1 MG PO TB24
ORAL_TABLET | ORAL | 1 refills | Status: DC
  Filled 2020-10-09: qty 270, 90d supply, fill #0

## 2020-10-09 MED ORDER — TRAZODONE HCL 50 MG PO TABS
ORAL_TABLET | ORAL | 1 refills | Status: DC
  Filled 2020-10-09: qty 180, 90d supply, fill #0
  Filled 2021-10-02: qty 180, 90d supply, fill #1

## 2020-10-09 MED ORDER — PAROXETINE HCL 40 MG PO TABS
ORAL_TABLET | ORAL | 1 refills | Status: DC
Start: 2020-10-09 — End: 2021-04-09
  Filled 2020-10-09: qty 180, 90d supply, fill #0
  Filled 2021-01-11: qty 180, 90d supply, fill #1

## 2020-10-09 MED ORDER — ZOLPIDEM TARTRATE 10 MG PO TABS
ORAL_TABLET | Freq: Every evening | ORAL | 1 refills | Status: DC | PRN
  Filled 2020-10-09: qty 90, 90d supply, fill #0
  Filled 2021-01-11: qty 90, 90d supply, fill #1

## 2020-10-09 NOTE — Progress Notes (Signed)
Brooke [name] [id number] 20-Oct-1970 50 y.o.  Subjective:   Patient ID:  Brooke [name] is a 50 y.o. (DOB 1971/03/29) female.  Chief Complaint:  Chief Complaint  Patient presents with   Follow-up   Generalized anxiety disorder   Anxiety   Sleeping Problem    HPI Brooke [name] presents to the office today for follow-up of anxiety and insomnia  seen December 2019.  For anxiety and weight gain topiramate was increased off label to 200 mg daily.  For anxiety propranolol 20 to 40 mg twice daily was added as needed.    Last seen November 2020: No meds were changed.  The following was noted: OK except for life events.  Still a lot of worry. Managed normally.  H agrees also.  Son's wife pregnant with another man's baby.  Real busy with 60 hours weekly.  Can't take Ambien at times bc of that.  Realizes she needs to take better self care.   Marland Kitchen August 23, 2019 appointment, the following is noted: Still doing well with meds.  Cont intermittent problems with work affecting sleep patterns and which sleep meds she can take.  Satisfied with meds.  No panic.  depression and anxiety manageable not gone.  Panic worse if overworked DT short staffed.  Patient reports stable mood and denies depressed or irritable moods.  Patient reports occasional difficulty with anxiety.  Patient reports occasionally difficulty with sleep initiation or maintenance. Denies appetite disturbance.  Patient reports that energy and motivation have been good.  Patient denies any difficulty with concentration.  Patient denies any suicidal ideation.  04/10/20 appt with following noted: Lost 33# at [facility] on 1000 calorie diet and [place]. A little extra anxiety lately without any specific reason for 2-3 mos.  No panic.  Mostly worry  Sx without avoidance.  Able to enjoy things normally.  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    Patient reports stable mood and denies depressed or irritable moods.  Patient denies difficulty with sleep initiation or maintenance. Denies appetite disturbance.  Patient reports that energy and motivation have been good.  Patient denies any difficulty with concentration.  Patient denies any suicidal ideation. Plan: Continue alprazolam XR 0.5 mg every morning and 1 mg nightly increase aripiprazole 10 mg daily to see anxiety is better Continue paroxetine 80 mg daily Continue propranolol 20 mg tablets 1-2 twice daily as needed anxiety Continue topiramate 50 mg tablets 2 twice daily Continue trazodone 100 mg nightly Continue zolpidem 5 to 10 mg nightly as needed insomnia She typically will alternate trazodone or Ambien depending on Brooke [name] work schedule.  10/09/20 appt noted: Good.  Lost 50# pounds and feels a whole lot better. Using [facility] Potential shoulder surgery. No panic.  Anxiety has been controlled.  Not depressed.   Increase in Abilify helped that a lot. No SE. Still shift work.  Low vitamin D.   No concerns about meds and no reason for changes. Doing well.  Past Psychiatric Medication Trials: Citalopram, fluoxetine, fluvoxamine, and Anafranil, paroxetine 80, duloxetine Abilify 10 Buspirone Topiramate Ambien, Ambien CR, trazodone Xanax XR  Review of Systems:  Review of Systems  Cardiovascular:  Negative for palpitations.  Musculoskeletal:  Positive for arthralgias.       Pending shoulder surgery  Neurological:  Negative for dizziness, tremors and weakness.  Psychiatric/Behavioral:  Positive for sleep disturbance. Negative for agitation, behavioral problems, confusion, decreased  concentration, dysphoric mood, hallucinations, self-injury and suicidal ideas. The patient is nervous/anxious. The patient is not hyperactive.    Medications: I have reviewed the patient's current medications.  Current Outpatient Medications  Medication Sig Dispense Refill   Cholecalciferol 1.25 MG  (50000 UT) capsule TAKE 1 CAPSULE BY MOUTH EVERY WEEK FOR 12 WEEKS 12 capsule 0   ibuprofen (ADVIL) 200 MG tablet Take 800 mg by mouth every 6 (six) hours as needed.     propranolol (INDERAL) 10 MG tablet Take 5 mg by mouth daily as needed.     Semaglutide, 1 MG/DOSE, 4 MG/3ML SOPN Inject 1 mg into the skin once a week. 3 mL 3   topiramate (TOPAMAX) 50 MG tablet TAKE 2 TABLETS (100 MG TOTAL) BY MOUTH 2 (TWO) TIMES DAILY. 360 tablet 1   ALPRAZolam (XANAX XR) 1 MG 24 hr tablet 1 in the AM and 2 in PM 270 tablet 1   amoxicillin-clavulanate (AUGMENTIN) 875-125 MG tablet Take 1 tablet by mouth every 12 (twelve) hours. (Patient not taking: Reported on 10/09/2020) 14 tablet 0   ARIPiprazole (ABILIFY) 10 MG tablet TAKE 1 TABLET (10 MG TOTAL) BY MOUTH DAILY. 90 tablet 1   fluticasone (FLONASE) 50 MCG/ACT nasal spray Place 1 spray into both nostrils daily. (Patient not taking: Reported on 10/09/2020) 15 mL 2   PARoxetine (PAXIL) 40 MG tablet TAKE 2 TABLETS (80 MG TOTAL) BY MOUTH AT BEDTIME. 180 tablet 1   traZODone (DESYREL) 50 MG tablet TAKE 2 TABLETS (100 MG TOTAL) BY MOUTH AT BEDTIME. 180 tablet 1   zolpidem (AMBIEN) 10 MG tablet TAKE 1 TABLET (10 MG TOTAL) BY MOUTH AT BEDTIME AS NEEDED FOR SLEEP. 90 tablet 1   No current facility-administered medications for this visit.    Medication Side Effects: None  Allergies: No Known Allergies  Past Medical History:  Diagnosis Date   Anxiety    Bursitis    Constipation    Depression    Fatty liver    seen on [place] 04-2018   HLD (hyperlipidemia)    Hyperglycemia 03/05/2013   Incontinence    Lower extremity edema    Lumbar stress fracture    Obesity    Osteoporosis    SOB (shortness of breath)     Family History  Problem Relation Age of Onset   Thyroid disease Mother    Hyperlipidemia Mother    Diabetes Mother    Hypertension Mother    Diabetes Father    Hypertension Father    Hyperlipidemia Father    Diabetes Paternal Grandmother     Hypertension Paternal Grandmother    Hyperlipidemia Paternal Grandmother    Mental illness Sister    Hypertension Paternal Uncle    Diabetes Paternal Aunt    Colon cancer Neg Hx    Esophageal cancer Neg Hx    Rectal cancer Neg Hx     Social History   Socioeconomic History   Marital status: Married    Spouse name: John   Number of children: Not on file   Years of education: 16   Highest education level: Not on file  Occupational History   Occupation: RN/short stay    Employer: [employer]  Tobacco Use   Smoking status: Never   Smokeless tobacco: Never  Vaping Use   Vaping Use: Never used  Substance and Sexual Activity   Alcohol use: No    Alcohol/week: 0.0 standard drinks   Drug use: No   Sexual activity: Yes    Partners:  Male    Birth control/protection: Surgical    Comment: Husband's vasectomy  Other Topics Concern   Not on file  Social History Narrative   Not on file   Social Determinants of Health   Financial Resource Strain: Not on file  Food Insecurity: Not on file  Transportation Needs: Not on file  Physical Activity: Not on file  Stress: Not on file  Social Connections: Not on file  Intimate Partner Violence: Not on file    Past Medical History, Surgical history, Social history, and Family history were reviewed and updated as appropriate.   Please see review of systems for further details on the patient's review from today.   Objective:   Physical Exam:  LMP 04/03/2018   Physical Exam Constitutional:      General: She is not in acute distress.    Appearance: Normal appearance.  Musculoskeletal:        General: No deformity.  Neurological:     Mental Status: She is alert and oriented to person, place, and time.     Coordination: Coordination normal.     Gait: Gait normal.  Psychiatric:        Attention and Perception: She is attentive.        Mood and Affect: Mood is anxious. Mood is not depressed. Affect is not labile, blunt, angry or  inappropriate.        Speech: Speech normal.        Behavior: Behavior normal.        Thought Content: Thought content normal. Thought content does not include homicidal or suicidal ideation. Thought content does not include homicidal or suicidal plan.        Cognition and Memory: Cognition normal.        Judgment: Judgment normal.     Comments: Insight intact. No auditory or visual hallucinations. No delusions.     Lab Review:     Component Value Date/Time   NA 143 08/21/2020 0804   K 4.0 08/21/2020 0804   CL 109 (H) 08/21/2020 0804   CO2 18 (L) 08/21/2020 0804   GLUCOSE 88 08/21/2020 0804   GLUCOSE 90 04/10/2019 0930   BUN 18 08/21/2020 0804   CREATININE 0.88 08/21/2020 0804   CREATININE 0.80 11/24/2018 1503   CALCIUM 9.1 08/21/2020 0804   PROT 6.6 08/21/2020 0804   ALBUMIN 4.6 08/21/2020 0804   AST 18 08/21/2020 0804   ALT 19 08/21/2020 0804   ALKPHOS 115 08/21/2020 0804   BILITOT 0.3 08/21/2020 0804   GFRNONAA 77 04/08/2020 0851   GFRAA 89 04/08/2020 0851       Component Value Date/Time   WBC 5.2 08/21/2020 0804   WBC 4.6 04/10/2019 0930   RBC 4.75 08/21/2020 0804   RBC 4.68 04/10/2019 0930   HGB 14.6 08/21/2020 0804   HCT 43.8 08/21/2020 0804   PLT 231 08/21/2020 0804   MCV 92 08/21/2020 0804   MCH 30.7 08/21/2020 0804   MCH 30.6 04/10/2019 0930   MCHC 33.3 08/21/2020 0804   MCHC 31.8 04/10/2019 0930   RDW 12.6 08/21/2020 0804   LYMPHSABS 1.5 08/21/2020 0804   MONOABS 0.4 04/10/2019 0930   EOSABS 0.1 08/21/2020 0804   BASOSABS 0.0 08/21/2020 0804    No results found for: POCLITH, LITHIUM   No results found for: PHENYTOIN, PHENOBARB, VALPROATE, CBMZ   .res Assessment: Plan:    Generalized anxiety disorder - Plan: PARoxetine (PAXIL) 40 MG tablet  Panic disorder with agoraphobia - Plan: PARoxetine (PAXIL)  40 MG tablet, ALPRAZolam (XANAX XR) 1 MG 24 hr tablet  Insomnia due to mental condition - Plan: traZODone (DESYREL) 50 MG tablet, zolpidem (AMBIEN)  10 MG tablet  Major depression, recurrent, full remission (HCC) - Plan: ARIPiprazole (ABILIFY) 10 MG tablet, PARoxetine (PAXIL) 40 MG tablet  Shift work sleep disorder   Overall med combo has worked well for depression and anxiety but is better than last time...  Disc options for benefit more: increase Abilify, topiramate off label, prn propranolol. Disc SE in detail of each option.   Prefer to avoid increasing Xanax which can make Brooke [name] sleepy in the daytime.    Discussed potential metabolic side effects associated with atypical antipsychotics, as well as potential risk for movement side effects. Advised pt to contact office if movement side effects occur.   We discussed the short-term risks associated with benzodiazepines including sedation and increased fall risk among others.  Discussed long-term side effect risk including dependence, potential withdrawal symptoms, and the potential eventual dose-related risk of dementia.  Newer studies published 2020 dispute risk of dementia with benzodiazepines.  And disc risk of Ambien amnesia.  Consider switch to Ativan from Xanax to reduce the sedation from Bz.    Better self care and more sleep would help. Protect SLEEP.  cONTINUE   topiramate bc help weight gain risk to 200mg  daily.  Disc SE.  Rx prn propranolol 20-40 mg BID for anxiety.   Disc polypharmacy is medcially necessary.  It has taken a lot of meds and combination of mechanisms in order to manage Brooke [name] depression and anxiety symptoms.  She tolerates the higher dose of paroxetine without side effects.  Panic was not managed with less paroxetine.  Disc SE in detail and SSRI withdrawal sx. Discussed serotonin syndrome.  Avoid dextromethorphan, tramadol which can increase risk. Continue alprazolam XR 0.5 mg every morning and 1 mg nightly Continue aripiprazole 10 mg daily  the increase helped anxiety is better Continue paroxetine 80 mg daily Continue propranolol 20 mg tablets 1-2 twice daily as  needed anxiety Continue topiramate 50 mg tablets 2 twice daily Continue trazodone 100 mg nightly Continue zolpidem 5 to 10 mg nightly as needed insomnia She typically will alternate trazodone or Ambien depending on Brooke [name] work schedule. No med changes today  This appt was 30 mins.  FU 6 mos  Lynder Parents, MD, DFAPA     Please see After Visit Summary for patient specific instructions.  Future Appointments  Date Time Provider [place]  10/22/2020  2:30 PM Thornton Park, MD [facility]  11/05/2020  8:00 AM Abby Potash, PA-C [facility] None    No orders of the defined types were placed in this encounter.     -------------------------------

## 2020-10-10 ENCOUNTER — Other Ambulatory Visit (HOSPITAL_BASED_OUTPATIENT_CLINIC_OR_DEPARTMENT_OTHER): Payer: Self-pay

## 2020-10-14 ENCOUNTER — Other Ambulatory Visit: Payer: Self-pay | Admitting: Psychiatry

## 2020-10-14 DIAGNOSIS — F411 Generalized anxiety disorder: Secondary | ICD-10-CM

## 2020-10-14 MED ORDER — TOPIRAMATE 50 MG PO TABS
ORAL_TABLET | ORAL | 1 refills | Status: DC
  Filled 2020-10-14: qty 360, 90d supply, fill #0
  Filled 2021-01-26: qty 360, 90d supply, fill #1

## 2020-10-15 ENCOUNTER — Other Ambulatory Visit (HOSPITAL_BASED_OUTPATIENT_CLINIC_OR_DEPARTMENT_OTHER): Payer: Self-pay

## 2020-10-21 NOTE — Patient Instructions (Addendum)
DUE TO COVID-19 ONLY ONE VISITOR IS ALLOWED TO COME WITH YOU AND STAY IN THE WAITING ROOM ONLY DURING PRE OP AND PROCEDURE.   **NO VISITORS ARE ALLOWED IN THE SHORT STAY AREA OR RECOVERY ROOM!!**        Your procedure is scheduled on:  Wednesday, 11-19-20   Report to [facility] Main  Entrance   Report to admitting at 8:30 AM   Call this number if you have problems the morning of surgery [phone]   Do not eat food :After Midnight.   May have liquids until 7:30 AM day of surgery  CLEAR LIQUID DIET  Foods Allowed                                                                     Foods Excluded  Water, Black Coffee and tea, regular and decaf                liquids that you cannot  Plain Jell-O in any flavor  (No red)                                      see through such as: Fruit ices (not with fruit pulp)                                      milk, soups, orange juice              Iced Popsicles (No red)                                      All solid food                                   Apple juices Sports drinks like Gatorade (No red) Lightly seasoned clear broth or consume(fat free) Sugar, honey syrup        Oral Hygiene is also important to reduce your risk of infection.                                    Remember - BRUSH YOUR TEETH THE MORNING OF SURGERY WITH YOUR REGULAR TOOTHPASTE   Do NOT smoke after Midnight   Take these medicines the morning of surgery with A SIP OF WATER: Alprazolam, Abilify, Propranolol, Topiramate  THE MORNING OF SURGERY:  Do not take Semaglutide.    Reviewed and Endorsed by [facility] Patient Education Committee, August 2015                               You may not have any metal on your body including hair pins, jewelry, and body piercing             Do not wear make-up, lotions, powders, perfumes or deodorant  Do not  wear nail polish including gel and S&S, artificial/acrylic nails, or any other type of covering on natural  nails including finger and toenails. If you have artificial nails, gel coating, etc. that needs to be removed by a nail salon please have this removed prior to surgery or surgery may need to be canceled/ delayed if the surgeon/ anesthesia feels like they are unable to be safely monitored.   Do not shave  48 hours prior to surgery.                 Do not bring valuables to the hospital. [place].   Contacts, dentures or bridgework may not be worn into surgery.   Patients discharged the day of surgery will not be allowed to drive home.   Please read over the following fact sheets you were given: IF YOU HAVE QUESTIONS ABOUT YOUR PRE OP INSTRUCTIONS PLEASE CALL [phone]   [facility] - Preparing for Surgery Before surgery, you can play an important role.  Because skin is not sterile, your skin needs to be as free of germs as possible.  You can reduce the number of germs on your skin by washing with CHG (chlorahexidine gluconate) soap before surgery.  CHG is an antiseptic cleaner which kills germs and bonds with the skin to continue killing germs even after washing. Please DO NOT use if you have an allergy to CHG or antibacterial soaps.  If your skin becomes reddened/irritated stop using the CHG and inform your nurse when you arrive at Short Stay. Do not shave (including legs and underarms) for at least 48 hours prior to the first CHG shower.  You may shave your face/neck.  Please follow these instructions carefully:  1.  Shower with CHG Soap the night before surgery and the  morning of surgery.  2.  If you choose to wash your hair, wash your hair first as usual with your normal  shampoo.  3.  After you shampoo, rinse your hair and body thoroughly to remove the shampoo.                             4.  Use CHG as you would any other liquid soap.  You can apply chg directly to the skin and wash.  Gently with a scrungie or clean washcloth.  5.  Apply the CHG  Soap to your body ONLY FROM THE NECK DOWN.   Do   not use on face/ open                           Wound or open sores. Avoid contact with eyes, ears mouth and   genitals (private parts).                       Wash face,  Genitals (private parts) with your normal soap.             6.  Wash thoroughly, paying special attention to the area where your    surgery  will be performed.  7.  Thoroughly rinse your body with warm water from the neck down.  8.  DO NOT shower/wash with your normal soap after using and rinsing off the CHG Soap.                9.  Pat yourself dry with a clean towel.  10.  Wear clean pajamas.            11.  Place clean sheets on your bed the night of your first shower and do not  sleep with pets. Day of Surgery : Do not apply any lotions/deodorants the morning of surgery.  Please wear clean clothes to the hospital/surgery center.  FAILURE TO FOLLOW THESE INSTRUCTIONS MAY RESULT IN THE CANCELLATION OF YOUR SURGERY  PATIENT SIGNATURE_________________________________  NURSE SIGNATURE__________________________________  ________________________________________________________________________

## 2020-10-21 NOTE — H&P (View-Only) (Signed)
COVID Vaccine Completed: Yes x3 Date COVID Vaccine completed: Has received booster: 02-07-20 COVID vaccine manufacturer: Pfizer     Date of COVID positive in last 90 days: N/A  PCP - Roma Schanz, DO Cardiologist - N/A  Chest x-ray - N/A EKG - 12-13-19 Epic Stress Test - N/A ECHO - N/A Cardiac Cath - N/A Pacemaker/ICD device last checked: Spinal Cord Stimulator:  Sleep Study - 2013 Epic, neg sleep apnea CPAP - No  Fasting Blood Sugar -  Checks Blood Sugar _____ times a day  Blood Thinner Instructions: N/A Aspirin Instructions: Last Dose:  Activity level:  Can go up a flight of stairs and perform activities of daily living without stopping and without symptoms of chest pain or shortness of breath.      Anesthesia review: N/A  Patient denies shortness of breath, fever, cough and chest pain at PAT appointment   Patient verbalized understanding of instructions that were given to them at the PAT appointment. Patient was also instructed that they will need to review over the PAT instructions again at home before surgery.

## 2020-10-21 NOTE — Progress Notes (Addendum)
COVID Vaccine Completed: Yes x3 Date COVID Vaccine completed: Has received booster: 02-07-20 COVID vaccine manufacturer: Pfizer     Date of COVID positive in last 90 days: N/A  PCP - Roma Schanz, DO Cardiologist - N/A  Chest x-ray - N/A EKG - 12-13-19 Epic Stress Test - N/A ECHO - N/A Cardiac Cath - N/A Pacemaker/ICD device last checked: Spinal Cord Stimulator:  Sleep Study - 2013 Epic, neg sleep apnea CPAP - No  Fasting Blood Sugar -  Checks Blood Sugar _____ times a day  Blood Thinner Instructions: N/A Aspirin Instructions: Last Dose:  Activity level:  Can go up a flight of stairs and perform activities of daily living without stopping and without symptoms of chest pain or shortness of breath.      Anesthesia review: N/A  Patient denies shortness of breath, fever, cough and chest pain at PAT appointment   Patient verbalized understanding of instructions that were given to them at the PAT appointment. Patient was also instructed that they will need to review over the PAT instructions again at home before surgery.

## 2020-10-22 ENCOUNTER — Ambulatory Visit (AMBULATORY_SURGERY_CENTER): Payer: No Typology Code available for payment source | Admitting: Gastroenterology

## 2020-10-22 ENCOUNTER — Encounter: Payer: Self-pay | Admitting: Gastroenterology

## 2020-10-22 ENCOUNTER — Other Ambulatory Visit: Payer: Self-pay

## 2020-10-22 VITALS — BP 109/71 | HR 75 | Temp 97.1°F | Resp 12 | Ht 63.5 in | Wt 143.0 lb

## 2020-10-22 DIAGNOSIS — K625 Hemorrhage of anus and rectum: Secondary | ICD-10-CM | POA: Diagnosis not present

## 2020-10-22 DIAGNOSIS — K5900 Constipation, unspecified: Secondary | ICD-10-CM

## 2020-10-22 DIAGNOSIS — Z1211 Encounter for screening for malignant neoplasm of colon: Secondary | ICD-10-CM

## 2020-10-22 DIAGNOSIS — K602 Anal fissure, unspecified: Secondary | ICD-10-CM

## 2020-10-22 DIAGNOSIS — D120 Benign neoplasm of cecum: Secondary | ICD-10-CM | POA: Diagnosis not present

## 2020-10-22 MED ORDER — SODIUM CHLORIDE 0.9 % IV SOLN: 500.0000 mL | Freq: Once | INTRAVENOUS | Status: DC

## 2020-10-22 MED ORDER — AMBULATORY NON FORMULARY MEDICATION
0 refills | Status: AC
Start: 2020-10-22 — End: ?

## 2020-10-22 NOTE — Op Note (Signed)
[facility] Patient Name: Brooke [name] Procedure Date: 10/22/2020 2:07 PM MRN: [id number] Endoscopist: Thornton Park MD, MD Age: 50 Referring MD:  Date of Birth: Jul 09, 1970 Gender: Female Account #: [id number] Procedure:                Colonoscopy Indications:              Screening for colorectal malignant neoplasm, This                            is the patient's first colonoscopy                           Incidental report of chronic constipation and                            rectal bleeding with straining Medicines:                Monitored Anesthesia Care Procedure:                Pre-Anesthesia Assessment:                           - Prior to the procedure, a History and Physical                            was performed, and patient medications and                            allergies were reviewed. The patient's tolerance of                            previous anesthesia was also reviewed. The risks                            and benefits of the procedure and the sedation                            options and risks were discussed with the patient.                            All questions were answered, and informed consent                            was obtained. Prior Anticoagulants: The patient has                            taken no previous anticoagulant or antiplatelet                            agents. ASA Grade Assessment: II - A patient with                            mild systemic disease. After reviewing the risks  and benefits, the patient was deemed in                            satisfactory condition to undergo the procedure.                           After obtaining informed consent, the colonoscope                            was passed under direct vision. Throughout the                            procedure, the patient's blood pressure, pulse, and                            oxygen saturations were monitored  continuously. The                            Olympus CF-[id number]) Colonoscope was                            introduced through the anus and advanced to the the                            cecum, identified by appendiceal orifice and                            ileocecal valve. The colonoscopy was technically                            difficult and complex due to a redundant colon and                            a tortuous colon. Successful completion of the                            procedure was aided by changing the patient's                            position, withdrawing and reinserting the scope and                            applying abdominal pressure. The patient tolerated                            the procedure well. The quality of the bowel                            preparation was adequate. The terminal ileum,                            ileocecal valve, appendiceal orifice, and rectum  were photographed. Scope In: 2:14:24 PM Scope Out: 2:37:57 PM Scope Withdrawal Time: 0 hours 15 minutes 46 seconds  Total Procedure Duration: 0 hours 23 minutes 33 seconds  Findings:                 Posterior and anterior anal fissures were found on                            perianal exam.                           Liquid stool and food residue was found in the                            entire colon.                           A 1 mm polyp was found in the cecum. The polyp was                            sessile. The polyp was removed with a cold snare.                            Resection and retrieval were complete. Estimated                            blood loss was minimal.                           The colon (entire examined portion) was tortuous.                           The exam was otherwise without abnormality on                            direct and retroflexion views. Complications:            No immediate complications. Estimated blood loss:                             Minimal. Estimated Blood Loss:     Estimated blood loss was minimal. Impression:               - Posterior and anterior anal fissure found on                            perianal exam.                           - One 1 mm polyp in the cecum, removed with a cold                            snare. Resected and retrieved.                           - Tortuous colon.                           -  The examination was otherwise normal on direct                            and retroflexion views. Recommendation:           - Patient has a contact number available for                            emergencies. The signs and symptoms of potential                            delayed complications were discussed with the                            patient. Return to normal activities tomorrow.                            Written discharge instructions were provided to the                            patient.                           - High fiber diet. Increase dietary fiber to 35                            grams daily and drink at least 64 ounces of water                            daily.                           - Continue present medications.                           - Add a daily dose of psyllium or methylcellulose                            to keep your stools soft.                           - Add Miralax 17 grams daily as needed to keep your                            stools soft.                           - Diltiazem 2% compared with lidocaine 5% ointement                            applied TID to the rectum for 6-8 weeks.                           - Await pathology results.                           -  Repeat colonoscopy date to be determined after                            pending pathology results are reviewed for                            surveillance. Use a different 2 day bowel prep in                            the future.                           - Follow a high fiber  diet. Drink at least 64                            ounces of water daily. Add a daily stool bulking                            agent such as psyllium (an exampled would be                            Metamucil).                           - Emerging evidence supports eating a diet of                            fruits, vegetables, grains, calcium, and yogurt                            while reducing red meat and alcohol may reduce the                            risk of colon cancer.                           - Follow-up in the office with Dr. Tarri Glenn or                            Ellouise Newer in 8 weeks, earlier if needed.                           - Thank you for allowing me to be involved in your                            colon cancer prevention. Thornton Park MD, MD 10/22/2020 2:46:48 PM This report has been signed electronically.

## 2020-10-22 NOTE — Progress Notes (Signed)
Called to room to assist during endoscopic procedure.  Patient ID and intended procedure confirmed with present staff. Received instructions for my participation in the procedure from the performing physician.  

## 2020-10-22 NOTE — Progress Notes (Signed)
To PACU, VSS. Report to [email or web address] 

## 2020-10-22 NOTE — Progress Notes (Signed)
VS-CW 

## 2020-10-22 NOTE — Patient Instructions (Addendum)
You should apply a pea size amount to the rectum 3 times a day for 6-8 weeks.  Please take your printed prescription to [facility].  [facility]'s information is below:  Address: [address] [place], [place] [address]  [phone]    Please read handouts provided. Continue present medications. Await pathology results. High Fiber Diet.Add a daily dose of psyllium or methylcellulose to keep your stools soft. Add Miralax 17 grams daily as needed to keep stools soft. Diltiazem 2% compounded with lidocaine ointment three times daily to rectum for 6-8 weeks. Follow-up in the office with Dr. Tarri Glenn or Ellouise Newer in 8 weeks, sooner if needed.    YOU HAD AN ENDOSCOPIC PROCEDURE TODAY AT [facility]:   Refer to the procedure report that was given to you for any specific questions about what was found during the examination.  If the procedure report does not answer your questions, please call your gastroenterologist to clarify.  If you requested that your care partner not be given the details of your procedure findings, then the procedure report has been included in a sealed envelope for you to review at your convenience later.  YOU SHOULD EXPECT: Some feelings of bloating in the abdomen. Passage of more gas than usual.  Walking can help get rid of the air that was put into your GI tract during the procedure and reduce the bloating. If you had a lower endoscopy (such as a colonoscopy or flexible sigmoidoscopy) you may notice spotting of blood in your stool or on the toilet paper. If you underwent a bowel prep for your procedure, you may not have a normal bowel movement for a few days.  Please Note:  You might notice some irritation and congestion in your nose or some drainage.  This is from the oxygen used during your procedure.  There is no need for concern and it should clear up in a day or so.  SYMPTOMS TO REPORT IMMEDIATELY:  Following lower  endoscopy (colonoscopy or flexible sigmoidoscopy):  Excessive amounts of blood in the stool  Significant tenderness or worsening of abdominal pains  Swelling of the abdomen that is new, acute  Fever of 100F or higher  Following upper endoscopy (EGD)  Vomiting of blood or coffee ground material  New chest pain or pain under the shoulder blades  Painful or persistently difficult swallowing  New shortness of breath  Fever of 100F or higher  Black, tarry-looking stools  For urgent or emergent issues, a gastroenterologist can be reached at any hour by calling [phone]. Do not use MyChart messaging for urgent concerns.    DIET:  We do recommend a small meal at first, but then you may proceed to your regular diet.  Drink plenty of fluids but you should avoid alcoholic beverages for 24 hours.  ACTIVITY:  You should plan to take it easy for the rest of today and you should NOT DRIVE or use heavy machinery until tomorrow (because of the sedation medicines used during the test).    FOLLOW UP: Our staff will call the number listed on your records 48-72 hours following your procedure to check on you and address any questions or concerns that you may have regarding the information given to you following your procedure. If we do not reach you, we will leave a message.  We will attempt to reach you two times.  During this call, we will ask if you have developed any symptoms of COVID  19. If you develop any symptoms (ie: fever, flu-like symptoms, shortness of breath, cough etc.) before then, please call [phone].  If you test positive for Covid 19 in the 2 weeks post procedure, please call and report this information to [place].    If any biopsies were taken you will be contacted by phone or by letter within the next 1-3 weeks.  Please call us at [phone] if you have not heard about the biopsies in 3 weeks.    SIGNATURES/CONFIDENTIALITY: You and/or your care partner have signed paperwork  which will be entered into your electronic medical record.  These signatures attest to the fact that that the information above on your After Visit Summary has been reviewed and is understood.  Full responsibility of the confidentiality of this discharge information lies with you and/or your care-partner.

## 2020-10-24 ENCOUNTER — Telehealth: Payer: Self-pay

## 2020-10-24 NOTE — Telephone Encounter (Signed)
  Follow up Call-  Call back number 10/22/2020  Post procedure Call Back phone  # [phone]  Permission to leave phone message Yes  Some recent data might be hidden     Patient questions:  Do you have a fever, pain , or abdominal swelling? No. Pain Score  0 *  Have you tolerated food without any problems? Yes.    Have you been able to return to your normal activities? Yes.    Do you have any questions about your discharge instructions: Diet   No. Medications  No. Follow up visit  No.  Do you have questions or concerns about your Care? No.  Actions: * If pain score is 4 or above: No action needed, pain <4.   Have you developed a fever since your procedure? No   2.   Have you had an respiratory symptoms (SOB or cough) since your procedure? No   3.   Have you tested positive for COVID 19 since your procedure no   4.   Have you had any family members/close contacts diagnosed with the COVID 19 since your procedure?  No    If yes to any of these questions please route to Joylene John, RN and Joella Prince, RN

## 2020-10-28 ENCOUNTER — Encounter: Payer: Self-pay | Admitting: Gastroenterology

## 2020-11-04 ENCOUNTER — Encounter (HOSPITAL_COMMUNITY)
Admission: RE | Admit: 2020-11-04 | Discharge: 2020-11-04 | Disposition: A | Discharge status: Home / Self Care | Payer: No Typology Code available for payment source | Source: Ambulatory Visit | Attending: Specialist | Admitting: Specialist

## 2020-11-04 ENCOUNTER — Encounter (HOSPITAL_COMMUNITY): Payer: Self-pay

## 2020-11-04 ENCOUNTER — Other Ambulatory Visit: Payer: Self-pay

## 2020-11-04 DIAGNOSIS — Z01812 Encounter for preprocedural laboratory examination: Secondary | ICD-10-CM | POA: Diagnosis present

## 2020-11-04 LAB — CBC
HCT: 44.2 % (ref 36.0–46.0)
Hemoglobin: 14.5 g/dL (ref 12.0–15.0)
MCH: 30.9 pg (ref 26.0–34.0)
MCHC: 32.8 g/dL (ref 30.0–36.0)
MCV: 94 fL (ref 80.0–100.0)
Platelets: 201 10*3/uL (ref 150–400)
RBC: 4.7 MIL/uL (ref 3.87–5.11)
RDW: 12.2 % (ref 11.5–15.5)
WBC: 4.5 10*3/uL (ref 4.0–10.5)
nRBC: 0 % (ref 0.0–0.2)

## 2020-11-04 NOTE — Progress Notes (Signed)
Please enter orders for surgery scheduled for 11-19-20.

## 2020-11-05 ENCOUNTER — Encounter (INDEPENDENT_AMBULATORY_CARE_PROVIDER_SITE_OTHER): Payer: Self-pay | Admitting: Family Medicine

## 2020-11-05 ENCOUNTER — Ambulatory Visit (INDEPENDENT_AMBULATORY_CARE_PROVIDER_SITE_OTHER): Payer: No Typology Code available for payment source | Admitting: Family Medicine

## 2020-11-05 VITALS — BP 96/65 | HR 89 | Temp 97.7°F | Ht 64.0 in | Wt 137.0 lb

## 2020-11-05 DIAGNOSIS — E782 Mixed hyperlipidemia: Secondary | ICD-10-CM | POA: Diagnosis not present

## 2020-11-05 DIAGNOSIS — Z9189 Other specified personal risk factors, not elsewhere classified: Secondary | ICD-10-CM | POA: Diagnosis not present

## 2020-11-05 DIAGNOSIS — M25511 Pain in right shoulder: Secondary | ICD-10-CM | POA: Diagnosis not present

## 2020-11-05 DIAGNOSIS — E559 Vitamin D deficiency, unspecified: Secondary | ICD-10-CM

## 2020-11-05 DIAGNOSIS — Z6831 Body mass index (BMI) 31.0-31.9, adult: Secondary | ICD-10-CM

## 2020-11-05 DIAGNOSIS — R632 Polyphagia: Secondary | ICD-10-CM | POA: Diagnosis not present

## 2020-11-05 DIAGNOSIS — E669 Obesity, unspecified: Secondary | ICD-10-CM

## 2020-11-05 DIAGNOSIS — G8929 Other chronic pain: Secondary | ICD-10-CM

## 2020-11-05 NOTE — Progress Notes (Signed)
Chief Complaint:   OBESITY Brooke [name] is here to discuss her progress with her obesity treatment plan along with follow-up of her obesity related diagnoses. See Medical Weight Management Flowsheet for complete bioelectrical impedance results.  Today's visit was #: 12 Starting weight: 185 lbs Starting date: 12/13/2019 Today's weight: 137 lbs Today's date: 11/05/2020 Weight change since last visit: +4 lbs Total lbs lost to date: 48 lbs Body mass index is 23.52 kg/m.  Total weight loss percentage to date: -25.95%  Interim History:  Brooke [name] is scheduled for shoulder arthroscopy on 11/19/2020.  She says she is happy with some weight gain.  She has been home, so she has been a little bored.  Nutrition Plan: keeping a food journal and adhering to recommended goals of 1700 calories and 100 grams of protein for 80% of the time. Activity:  Walking for 30-40 minutes 2 times per week. Anti-obesity medications: Ozempic 1 mg subcutaneously weekly. Reported side effects: None.  Assessment/Plan:   1. Vitamin D deficiency Not at goal.  She is taking vitamin D 50,000 IU weekly.  Plan: Continue to take prescription Vitamin D @50 ,000 IU every week as prescribed.  Follow-up for routine testing of Vitamin D, at least 2-3 times per year to avoid over-replacement.  Lab Results  Component Value Date   VD25OH 24.3 (L) 08/21/2020   VD25OH 28.5 (L) 04/08/2020   VD25OH 19.0 (L) 12/13/2019   - Refill Cholecalciferol 1.25 MG (50000 UT) capsule; TAKE 1 CAPSULE BY MOUTH EVERY WEEK FOR 12 WEEKS  Dispense: 12 capsule; Refill: 0  2. Mixed hyperlipidemia Course: Not at goal. Lipid-lowering medications: None.   Plan: Dietary changes: Increase soluble fiber, decrease simple carbohydrates, decrease saturated fat. Exercise changes: Moderate to vigorous-intensity aerobic activity 150 minutes per week or as tolerated. We will continue to monitor along with PCP/specialists as it pertains to her weight loss  journey.  Lab Results  Component Value Date   CHOL 194 08/21/2020   HDL 50 08/21/2020   LDLCALC 117 (H) 08/21/2020   LDLDIRECT 130.6 09/05/2009   TRIG 151 (H) 08/21/2020   CHOLHDL 3.9 08/21/2020   Lab Results  Component Value Date   ALT 19 08/21/2020   AST 18 08/21/2020   ALKPHOS 115 08/21/2020   BILITOT 0.3 08/21/2020   The 10-year ASCVD risk score Brooke Bussing DC Jr., et al., 2013) is: 0.9%   Values used to calculate the score:     Age: 50 years     Sex: Female     Is Non-Hispanic African American: No     Diabetic: No     Tobacco smoker: No     Systolic Blood Pressure: 96 mmHg     Is BP treated: No     HDL Cholesterol: 50 mg/dL     Total Cholesterol: 194 mg/dL  3. Chronic right shoulder pain Marshell is scheduled for shoulder arthroscopy on 11/19/2020.  4. Polyphagia Controlled. Current treatment: Ozempic 1 mg subcutaneously weekly.Brooke [name] refers to excessive feelings of hunger. She will continue to focus on protein-rich, low simple carbohydrate foods. We reviewed the importance of hydration, regular exercise for stress reduction, and restorative sleep.  5. At risk for impaired metabolic function Due to Brooke [name]'s current state of health and medical condition(s), she is at a significantly higher risk for impaired metabolic function.   At least 8 minutes was spent on counseling [name] about these concerns today.  This places the patient at a much greater risk to subsequently develop cardio-pulmonary conditions that can  negatively affect the patient's quality of life.  I stressed the importance of reversing these risks factors.  6. Obesity, current BMI 23.5  Course: Brooke [name] is currently in the action stage of change. As such, her goal is to continue with weight loss efforts.   Nutrition goals: She has agreed to keeping a food journal and adhering to recommended goals of 1700 calories and 100 grams of protein.   Exercise goals:  As is.  Behavioral modification  strategies: increasing lean protein intake, decreasing simple carbohydrates, increasing vegetables, increasing water intake, and decreasing liquid calories.  [name] has agreed to follow-up with our clinic in 4 weeks. She was informed of the importance of frequent follow-up visits to maximize her success with intensive lifestyle modifications for her multiple health conditions.   Objective:   Blood pressure 96/65, pulse 89, temperature 97.7 F (36.5 C), temperature source Oral, height 5\' 4"  (1.626 m), weight 137 lb (62.1 kg), last menstrual period 04/03/2018, SpO2 98 %. Body mass index is 23.52 kg/m.  General: Cooperative, alert, well developed, in no acute distress. HEENT: Conjunctivae and lids unremarkable. Cardiovascular: Regular rhythm.  Lungs: Normal work of breathing. Neurologic: No focal deficits.   Lab Results  Component Value Date   CREATININE 0.88 08/21/2020   BUN 18 08/21/2020   NA 143 08/21/2020   K 4.0 08/21/2020   CL 109 (H) 08/21/2020   CO2 18 (L) 08/21/2020   Lab Results  Component Value Date   ALT 19 08/21/2020   AST 18 08/21/2020   ALKPHOS 115 08/21/2020   BILITOT 0.3 08/21/2020   Lab Results  Component Value Date   HGBA1C 5.5 12/13/2019   HGBA1C 5.5 03/05/2013   Lab Results  Component Value Date   INSULIN 13.6 08/21/2020   INSULIN 12.0 12/13/2019   Lab Results  Component Value Date   TSH 3.960 12/13/2019   Lab Results  Component Value Date   CHOL 194 08/21/2020   HDL 50 08/21/2020   LDLCALC 117 (H) 08/21/2020   LDLDIRECT 130.6 09/05/2009   TRIG 151 (H) 08/21/2020   CHOLHDL 3.9 08/21/2020   Lab Results  Component Value Date   VD25OH 24.3 (L) 08/21/2020   VD25OH 28.5 (L) 04/08/2020   VD25OH 19.0 (L) 12/13/2019   Lab Results  Component Value Date   WBC 4.5 11/04/2020   HGB 14.5 11/04/2020   HCT 44.2 11/04/2020   MCV 94.0 11/04/2020   PLT 201 11/04/2020   Lab Results  Component Value Date   IRON 128 08/21/2020   TIBC 256  08/21/2020   FERRITIN 198 (H) 08/21/2020   Attestation Statements:   Reviewed by clinician on day of visit: allergies, medications, problem list, medical history, surgical history, family history, social history, and previous encounter notes.  I, Water quality scientist, CMA, am acting as transcriptionist for Briscoe Deutscher, DO  I have reviewed the above documentation for accuracy and completeness, and I agree with the above. Briscoe Deutscher, DO

## 2020-11-06 ENCOUNTER — Other Ambulatory Visit (HOSPITAL_BASED_OUTPATIENT_CLINIC_OR_DEPARTMENT_OTHER): Payer: Self-pay

## 2020-11-06 MED ORDER — CHOLECALCIFEROL 1.25 MG (50000 UT) PO CAPS
ORAL_CAPSULE | ORAL | 0 refills | Status: DC
  Filled 2020-11-06: qty 12, fill #0

## 2020-11-19 ENCOUNTER — Ambulatory Visit (HOSPITAL_COMMUNITY)
Admission: RE | Admit: 2020-11-19 | Discharge: 2020-11-19 | Disposition: A | Discharge status: Home / Self Care | Payer: No Typology Code available for payment source | Attending: Specialist | Admitting: Specialist

## 2020-11-19 ENCOUNTER — Encounter (HOSPITAL_COMMUNITY)
Admission: RE | Disposition: A | Discharge status: Home / Self Care | Payer: Self-pay | Source: Home / Self Care | Attending: Specialist

## 2020-11-19 ENCOUNTER — Ambulatory Visit (HOSPITAL_COMMUNITY): Payer: No Typology Code available for payment source | Admitting: Anesthesiology

## 2020-11-19 ENCOUNTER — Other Ambulatory Visit (HOSPITAL_BASED_OUTPATIENT_CLINIC_OR_DEPARTMENT_OTHER): Payer: Self-pay

## 2020-11-19 ENCOUNTER — Encounter (HOSPITAL_COMMUNITY): Payer: Self-pay | Admitting: Specialist

## 2020-11-19 DIAGNOSIS — S43081A Other subluxation of right shoulder joint, initial encounter: Secondary | ICD-10-CM | POA: Insufficient documentation

## 2020-11-19 DIAGNOSIS — Z79899 Other long term (current) drug therapy: Secondary | ICD-10-CM | POA: Diagnosis not present

## 2020-11-19 DIAGNOSIS — M7501 Adhesive capsulitis of right shoulder: Secondary | ICD-10-CM | POA: Insufficient documentation

## 2020-11-19 DIAGNOSIS — X58XXXA Exposure to other specified factors, initial encounter: Secondary | ICD-10-CM | POA: Insufficient documentation

## 2020-11-19 DIAGNOSIS — M7541 Impingement syndrome of right shoulder: Secondary | ICD-10-CM | POA: Insufficient documentation

## 2020-11-19 DIAGNOSIS — M19011 Primary osteoarthritis, right shoulder: Secondary | ICD-10-CM | POA: Diagnosis not present

## 2020-11-19 LAB — CBC
HCT: 38.7 % (ref 36.0–46.0)
Hemoglobin: 12.9 g/dL (ref 12.0–15.0)
MCH: 31.4 pg (ref 26.0–34.0)
MCHC: 33.3 g/dL (ref 30.0–36.0)
MCV: 94.2 fL (ref 80.0–100.0)
Platelets: 177 10*3/uL (ref 150–400)
RBC: 4.11 MIL/uL (ref 3.87–5.11)
RDW: 12.5 % (ref 11.5–15.5)
WBC: 5.5 10*3/uL (ref 4.0–10.5)
nRBC: 0 % (ref 0.0–0.2)

## 2020-11-19 LAB — COMPREHENSIVE METABOLIC PANEL
ALT: 27 U/L (ref 0–44)
AST: 20 U/L (ref 15–41)
Albumin: 3.9 g/dL (ref 3.5–5.0)
Alkaline Phosphatase: 81 U/L (ref 38–126)
Anion gap: 7 (ref 5–15)
BUN: 18 mg/dL (ref 6–20)
CO2: 25 mmol/L (ref 22–32)
Calcium: 9 mg/dL (ref 8.9–10.3)
Chloride: 110 mmol/L (ref 98–111)
Creatinine, Ser: 0.88 mg/dL (ref 0.44–1.00)
GFR, Estimated: >60 mL/min (ref >60)
Glucose, Bld: 84 mg/dL (ref 70–99)
Potassium: 4 mmol/L (ref 3.5–5.1)
Sodium: 142 mmol/L (ref 135–145)
Total Bilirubin: 0.4 mg/dL (ref 0.3–1.2)
Total Protein: 6.2 g/dL — ABNORMAL LOW (ref 6.5–8.1)

## 2020-11-19 SURGERY — SHOULDER ARTHROSCOPY WITH SUBACROMIAL DECOMPRESSION AND DISTAL CLAVICLE EXCISION
Anesthesia: General | Site: Shoulder | Laterality: Right

## 2020-11-19 MED ORDER — LACTATED RINGERS IV SOLN: INTRAVENOUS | Status: DC

## 2020-11-19 MED ORDER — LIDOCAINE 2% (20 MG/ML) 5 ML SYRINGE
INTRAMUSCULAR | Status: AC
  Filled 2020-11-19: qty 5

## 2020-11-19 MED ORDER — ORAL CARE MOUTH RINSE: 15.0000 mL | Freq: Once | OROMUCOSAL | Status: DC

## 2020-11-19 MED ORDER — METHOCARBAMOL 500 MG PO TABS
500.0000 mg | ORAL_TABLET | Freq: Four times a day (QID) | ORAL | 0 refills | Status: DC
  Filled 2020-11-19: qty 40, 10d supply, fill #0

## 2020-11-19 MED ORDER — EPHEDRINE 5 MG/ML INJ
INTRAVENOUS | Status: AC
  Filled 2020-11-19: qty 5

## 2020-11-19 MED ORDER — ONDANSETRON HCL 4 MG/2ML IJ SOLN: 4.0000 mg | Freq: Once | INTRAMUSCULAR | Status: DC | PRN

## 2020-11-19 MED ORDER — SODIUM CHLORIDE 0.9 % IV SOLN
2.0000 g | INTRAVENOUS | Status: AC
  Administered 2020-11-19: 2 g via INTRAVENOUS
  Filled 2020-11-19: qty 2

## 2020-11-19 MED ORDER — CEPHALEXIN 500 MG PO CAPS
500.0000 mg | ORAL_CAPSULE | Freq: Four times a day (QID) | ORAL | 0 refills | Status: AC
  Filled 2020-11-19: qty 40, 10d supply, fill #0

## 2020-11-19 MED ORDER — OXYCODONE HCL 5 MG PO TABS
5.0000 mg | ORAL_TABLET | ORAL | 0 refills | Status: AC | PRN
  Filled 2020-11-19: qty 40, 7d supply, fill #0

## 2020-11-19 MED ORDER — MIDAZOLAM HCL 2 MG/2ML IJ SOLN
INTRAMUSCULAR | Status: AC
  Filled 2020-11-19: qty 2

## 2020-11-19 MED ORDER — HYDROMORPHONE HCL 1 MG/ML IJ SOLN: 0.2500 mg | INTRAMUSCULAR | Status: DC | PRN

## 2020-11-19 MED ORDER — OXYCODONE HCL 5 MG/5ML PO SOLN: 5.0000 mg | Freq: Once | ORAL | Status: DC | PRN

## 2020-11-19 MED ORDER — PROPOFOL 10 MG/ML IV BOLUS
INTRAVENOUS | Status: AC
  Filled 2020-11-19: qty 20

## 2020-11-19 MED ORDER — FENTANYL CITRATE (PF) 100 MCG/2ML IJ SOLN
50.0000 ug | INTRAMUSCULAR | Status: AC
  Administered 2020-11-19: 50 ug via INTRAVENOUS
  Filled 2020-11-19: qty 2

## 2020-11-19 MED ORDER — ROCURONIUM BROMIDE 10 MG/ML (PF) SYRINGE
PREFILLED_SYRINGE | INTRAVENOUS | Status: AC
  Filled 2020-11-19: qty 10

## 2020-11-19 MED ORDER — CHLORHEXIDINE GLUCONATE 0.12 % MT SOLN: 15.0000 mL | Freq: Once | OROMUCOSAL | Status: DC

## 2020-11-19 MED ORDER — KETOROLAC TROMETHAMINE 30 MG/ML IJ SOLN: 30.0000 mg | Freq: Once | INTRAMUSCULAR | Status: DC | PRN

## 2020-11-19 MED ORDER — PROPOFOL 10 MG/ML IV BOLUS
INTRAVENOUS | Status: DC | PRN
  Administered 2020-11-19: 150 mg via INTRAVENOUS

## 2020-11-19 MED ORDER — ONDANSETRON HCL 4 MG PO TABS
4.0000 mg | ORAL_TABLET | Freq: Every day | ORAL | 1 refills | Status: AC | PRN
  Filled 2020-11-19: qty 30, 30d supply, fill #0
  Filled 2021-10-23: qty 30, 30d supply, fill #1

## 2020-11-19 MED ORDER — DEXAMETHASONE SODIUM PHOSPHATE 10 MG/ML IJ SOLN
INTRAMUSCULAR | Status: DC | PRN
  Administered 2020-11-19: 10 mg via INTRAVENOUS

## 2020-11-19 MED ORDER — PHENYLEPHRINE 40 MCG/ML (10ML) SYRINGE FOR IV PUSH (FOR BLOOD PRESSURE SUPPORT)
PREFILLED_SYRINGE | INTRAVENOUS | Status: AC
  Filled 2020-11-19: qty 10

## 2020-11-19 MED ORDER — BUPIVACAINE HCL (PF) 0.5 % IJ SOLN
INTRAMUSCULAR | Status: DC | PRN
  Administered 2020-11-19: 15 mL via PERINEURAL

## 2020-11-19 MED ORDER — EPINEPHRINE PF 1 MG/ML IJ SOLN
INTRAMUSCULAR | Status: DC | PRN
  Administered 2020-11-19: .02 mL

## 2020-11-19 MED ORDER — LACTATED RINGERS IR SOLN
Status: DC | PRN
  Administered 2020-11-19: 9000 mL

## 2020-11-19 MED ORDER — ONDANSETRON HCL 4 MG/2ML IJ SOLN
INTRAMUSCULAR | Status: DC | PRN
  Administered 2020-11-19: 4 mg via INTRAVENOUS

## 2020-11-19 MED ORDER — BUPIVACAINE LIPOSOME 1.3 % IJ SUSP
INTRAMUSCULAR | Status: DC | PRN
  Administered 2020-11-19: 10 mL

## 2020-11-19 MED ORDER — TRIAMCINOLONE ACETONIDE 40 MG/ML IJ SUSP
INTRAMUSCULAR | Status: DC | PRN
  Administered 2020-11-19: 80 mg

## 2020-11-19 MED ORDER — MIDAZOLAM HCL 2 MG/2ML IJ SOLN
1.0000 mg | INTRAMUSCULAR | Status: AC
  Administered 2020-11-19: 2 mg via INTRAVENOUS
  Filled 2020-11-19: qty 2

## 2020-11-19 MED ORDER — FENTANYL CITRATE (PF) 100 MCG/2ML IJ SOLN
INTRAMUSCULAR | Status: AC
  Filled 2020-11-19: qty 2

## 2020-11-19 MED ORDER — SODIUM CHLORIDE (PF) 0.9 % IJ SOLN
INTRAMUSCULAR | Status: DC | PRN
  Administered 2020-11-19: 30 mL

## 2020-11-19 MED ORDER — PHENYLEPHRINE HCL (PRESSORS) 10 MG/ML IV SOLN
INTRAVENOUS | Status: AC
  Filled 2020-11-19: qty 1

## 2020-11-19 MED ORDER — ROCURONIUM BROMIDE 10 MG/ML (PF) SYRINGE
PREFILLED_SYRINGE | INTRAVENOUS | Status: DC | PRN
  Administered 2020-11-19: 60 mg via INTRAVENOUS
  Administered 2020-11-19: 20 mg via INTRAVENOUS

## 2020-11-19 MED ORDER — SODIUM CHLORIDE 0.9 % IR SOLN
Status: DC | PRN
  Administered 2020-11-19: 1000 mL

## 2020-11-19 MED ORDER — FENTANYL CITRATE (PF) 250 MCG/5ML IJ SOLN
INTRAMUSCULAR | Status: DC | PRN
  Administered 2020-11-19 (×2): 50 ug via INTRAVENOUS

## 2020-11-19 MED ORDER — LIDOCAINE 2% (20 MG/ML) 5 ML SYRINGE
INTRAMUSCULAR | Status: DC | PRN
  Administered 2020-11-19: 60 mg via INTRAVENOUS

## 2020-11-19 MED ORDER — OXYCODONE HCL 5 MG PO TABS: 5.0000 mg | ORAL_TABLET | Freq: Once | ORAL | Status: DC | PRN

## 2020-11-19 MED ORDER — SUGAMMADEX SODIUM 200 MG/2ML IV SOLN
INTRAVENOUS | Status: DC | PRN
  Administered 2020-11-19: 150 mg via INTRAVENOUS

## 2020-11-19 MED ORDER — TRAMADOL HCL 50 MG PO TABS
50.0000 mg | ORAL_TABLET | Freq: Four times a day (QID) | ORAL | 0 refills | Status: AC | PRN
  Filled 2020-11-19: qty 30, 8d supply, fill #0

## 2020-11-19 MED ORDER — EPINEPHRINE PF 1 MG/ML IJ SOLN
INTRAMUSCULAR | Status: AC
  Filled 2020-11-19: qty 1

## 2020-11-19 SURGICAL SUPPLY — 56 items
APL PRP STRL LF DISP 70% ISPRP (MISCELLANEOUS) ×1
BAG COUNTER SPONGE SURGICOUNT (BAG) IMPLANT
BAG SPEC THK2 15X12 ZIP CLS (MISCELLANEOUS) ×1
BAG SPNG CNTER NS LX DISP (BAG)
BAG ZIPLOCK 12X15 (MISCELLANEOUS) ×2 IMPLANT
BLADE EXCALIBUR 4.0X13 (MISCELLANEOUS) ×1 IMPLANT
BLADE EXCALIBUR 5.0X13 (MISCELLANEOUS) IMPLANT
BLADE SURG SZ11 CARB STEEL (BLADE) ×2 IMPLANT
BNDG COHESIVE 4X5 TAN STRL (GAUZE/BANDAGES/DRESSINGS) ×2 IMPLANT
BOOTIES KNEE HIGH SLOAN (MISCELLANEOUS) ×4 IMPLANT
BURR OVAL 8 FLU 5.0X13 (MISCELLANEOUS) ×1 IMPLANT
CANNULA 5.75X7 CRYSTAL CLEAR (CANNULA) ×1 IMPLANT
CANNULA 5.75X71 LONG (CANNULA) ×2 IMPLANT
CHLORAPREP W/TINT 26 (MISCELLANEOUS) ×2 IMPLANT
DRAPE ORTHO 2.5IN SPLIT 77X108 (DRAPES) IMPLANT
DRAPE ORTHO SPLIT 77X108 STRL (DRAPES)
DRAPE POUCH INSTRU U-SHP 10X18 (DRAPES) ×4 IMPLANT
DRAPE STERI 35X30 U-POUCH (DRAPES) ×2 IMPLANT
DRAPE SURG 17X11 SM STRL (DRAPES) ×2 IMPLANT
DRAPE U-SHAPE 47X51 STRL (DRAPES) ×2 IMPLANT
DRSG PAD ABDOMINAL 8X10 ST (GAUZE/BANDAGES/DRESSINGS) ×3 IMPLANT
DW OUTFLOW CASSETTE/TUBE SET (MISCELLANEOUS) ×2 IMPLANT
ELECT REM PT RETURN 15FT ADLT (MISCELLANEOUS) IMPLANT
EXCALIBUR 3.8MM X 13CM (MISCELLANEOUS) ×2 IMPLANT
FIBERSTICK 2 (SUTURE) IMPLANT
GAUZE SPONGE 4X4 12PLY STRL (GAUZE/BANDAGES/DRESSINGS) ×1 IMPLANT
GAUZE XEROFORM 1X8 LF (GAUZE/BANDAGES/DRESSINGS) ×1 IMPLANT
GLOVE SRG 8 PF TXTR STRL LF DI (GLOVE) ×1 IMPLANT
GLOVE SURG POLYISO LF SZ7 (GLOVE) ×2 IMPLANT
GLOVE SURG UNDER POLY LF SZ7.5 (GLOVE) ×2 IMPLANT
GLOVE SURG UNDER POLY LF SZ8 (GLOVE) ×2
GOWN STRL REUS W/TWL XL LVL3 (GOWN DISPOSABLE) ×4 IMPLANT
KIT BASIN OR (CUSTOM PROCEDURE TRAY) ×2 IMPLANT
KIT STABILIZATION SHOULDER (MISCELLANEOUS) ×2 IMPLANT
KIT TURNOVER KIT A (KITS) ×2 IMPLANT
MANIFOLD NEPTUNE II (INSTRUMENTS) ×2 IMPLANT
NDL SCORPION MULTI FIRE (NEEDLE) IMPLANT
NDL SPNL 18GX3.5 QUINCKE PK (NEEDLE) ×1 IMPLANT
NEEDLE HYPO 22GX1.5 SAFETY (NEEDLE) ×2 IMPLANT
NEEDLE SCORPION MULTI FIRE (NEEDLE) IMPLANT
NEEDLE SPNL 18GX3.5 QUINCKE PK (NEEDLE) ×2 IMPLANT
NS IRRIG 1000ML POUR BTL (IV SOLUTION) ×2 IMPLANT
PACK SHOULDER (CUSTOM PROCEDURE TRAY) ×2 IMPLANT
PORT APPOLLO RF 90DEGREE MULTI (SURGICAL WAND) ×2 IMPLANT
PROTECTOR NERVE ULNAR (MISCELLANEOUS) ×2 IMPLANT
SLEEVE ARM SUSPENSION SYSTEM (MISCELLANEOUS) ×1 IMPLANT
SLING ARM FOAM STRAP MED (SOFTGOODS) ×1 IMPLANT
SLING ARM IMMOBILIZER LRG (SOFTGOODS) IMPLANT
SLING ARM IMMOBILIZER MED (SOFTGOODS) IMPLANT
SUT ETHILON 3 0 PS 1 (SUTURE) ×2 IMPLANT
SUT PDS AB 0 CT 36 (SUTURE) IMPLANT
SYR 20ML LL LF (SYRINGE) ×2 IMPLANT
TOWEL OR 17X26 10 PK STRL BLUE (TOWEL DISPOSABLE) ×4 IMPLANT
TUBING ARTHROSCOPY IRRIG 16FT (MISCELLANEOUS) ×2 IMPLANT
TUBING CONNECTING 10 (TUBING) ×2 IMPLANT
WATER STERILE IRR 1000ML POUR (IV SOLUTION) ×2 IMPLANT

## 2020-11-19 NOTE — Anesthesia Preprocedure Evaluation (Signed)
Anesthesia Evaluation  Patient identified by MRN, date of birth, ID band Patient awake    Reviewed: Allergy & Precautions, NPO status , Patient's Chart, lab work & pertinent test results  Airway Mallampati: II  TM Distance: >3 FB Neck ROM: Full    Dental no notable dental hx.    Pulmonary neg pulmonary ROS,    Pulmonary exam normal breath sounds clear to auscultation       Cardiovascular negative cardio ROS Normal cardiovascular exam Rhythm:Regular Rate:Normal     Neuro/Psych Anxiety Depression negative neurological ROS     GI/Hepatic negative GI ROS, Neg liver ROS,   Endo/Other  negative endocrine ROS  Renal/GU negative Renal ROS  negative genitourinary   Musculoskeletal negative musculoskeletal ROS (+)   Abdominal   Peds negative pediatric ROS (+)  Hematology negative hematology ROS (+)   Anesthesia Other Findings   Reproductive/Obstetrics negative OB ROS                             Anesthesia Physical Anesthesia Plan  ASA: 2  Anesthesia Plan: General   Post-op Pain Management:  Regional for Post-op pain   Induction: Intravenous  PONV Risk Score and Plan: 3 and Ondansetron, Dexamethasone and Treatment may vary due to age or medical condition  Airway Management Planned: Oral ETT  Additional Equipment:   Intra-op Plan:   Post-operative Plan: Extubation in OR  Informed Consent: I have reviewed the patients History and Physical, chart, labs and discussed the procedure including the risks, benefits and alternatives for the proposed anesthesia with the patient or authorized representative who has indicated his/her understanding and acceptance.     Dental advisory given  Plan Discussed with: CRNA and Surgeon  Anesthesia Plan Comments:         Anesthesia Quick Evaluation

## 2020-11-19 NOTE — Anesthesia Postprocedure Evaluation (Signed)
Anesthesia Post Note  Patient: Brooke [name]  Procedure(s) Performed: SHOULDER ARTHROSCOPY WITH SUBACROMIAL DECOMPRESSION AND DISTAL CLAVICLE EXCISION BICEPS TENOTOMY (Right: Shoulder)     Patient location during evaluation: PACU Anesthesia Type: General Level of consciousness: awake and alert Pain management: pain level controlled Vital Signs Assessment: post-procedure vital signs reviewed and stable Respiratory status: spontaneous breathing, nonlabored ventilation, respiratory function stable and patient connected to nasal cannula oxygen Cardiovascular status: blood pressure returned to baseline and stable Postop Assessment: no apparent nausea or vomiting Anesthetic complications: no   No notable events documented.  Last Vitals:  Vitals:   11/19/20 1300 11/19/20 1315  BP: 100/67 98/72  Pulse: 82 76  Resp: 15 11  Temp:  36.6 C  SpO2: 91% 93%    Last Pain:  Vitals:   11/19/20 1315  TempSrc:   PainSc: 0-No pain                 Alante Tolan S

## 2020-11-19 NOTE — Op Note (Signed)
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  Take the operating room placed in supine position under general anesthesia.  PAS stockings were applied for DVT prophylaxis.  No significant limitations of the shoulder were noted while prepping prep with ChloraPrep draped into a sterile fashion utilized Arthrex sterile shoulder holder 30 degrees abduction 30 degrees of forward flexion 10 to 15 pounds longitudinal traction making sure to not over distract the extremity.  After timeout posterior portal was created arthroscope placed into the glenohumeral joint immediately identifiable was synovitis biceps subluxation biceps tendinopathy labrum intact articular's and had glenohumeral ligaments normal synovium capsule rotator cuff intact.  Anterior portal was made from outside in technique through the rotator cuff interval cautery was introduced biceps tenotomy was successfully performed biceps retracted to the level of the upper bicipital groove superior labrum was smoothed down nicely.  Scope placed in subacromial region thick subacromial subdeltoid bursa was encountered lateral portal established neurovascular structures were protected axillary nerve subacromial subdeltoid bursectomy was performed.  She had scuffing of the rotator  cuff underneath the CA arch consistent with extrinsic impingement cautery utilized released periosteum CA ligament burs and placed posteriorly light anterior inferior lateral acromioplasty was performed converting to a type I acromion morphology.  This appeared to significantly decrease the impingement increase the volume of the subacromial space.  AC joint was found to be markedly osteoarthritic burs placed in anterior lateral 5 to 8 mm of the distal clavicle was removed leaving the superior capsule intact the clavicles palpated found be stable debris was removed hemostasis obtained.  18-gauge spinal needle was placed to the shoulder joint 80 mg of Kenalog was placed in the shoulder joint needle was removed portals closed 4 nylon suture sterile dressing applied placed into a sling turned supine awakened taken operating PACU stable condition.  She was stabilized in PACU discharged home she will start physical therapy soon as possible without further restrictions no biceps loading for 8 weeks recheck in 2 weeks x-rays at that time  To decrease surgical time help with retraction assistance surgical decision making wound closure application dressing and sling Ms. Leanne Hoss, PA-C assistance was needed throughout the entire case.

## 2020-11-19 NOTE — Anesthesia Procedure Notes (Signed)

## 2020-11-19 NOTE — Anesthesia Procedure Notes (Signed)
Anesthesia Regional Block: Interscalene brachial plexus block   Pre-Anesthetic Checklist: , timeout performed,  Correct Patient, Correct Site, Correct Laterality,  Correct Procedure, Correct Position, site marked,  Risks and benefits discussed,  Surgical consent,  Pre-op evaluation,  At surgeon's request and post-op pain management  Laterality: Right  Prep: chloraprep       Needles:  Injection technique: Single-shot  Needle Type: Echogenic Needle     Needle Length: 9cm      Additional Needles:   Procedures:,,,, ultrasound used (permanent image in chart),,    Narrative:  Start time: 11/19/2020 9:45 AM End time: 11/19/2020 9:52 AM Injection made incrementally with aspirations every 5 mL.  Performed by: Personally  Anesthesiologist: Myrtie Soman, MD  Additional Notes: Patient tolerated the procedure well without complications

## 2020-11-19 NOTE — Interval H&P Note (Signed)
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  The various methods of treatment have been discussed with the patient and family. After consideration of risks, benefits and other options for treatment, the patient has consented to  Procedure(s) with comments: SHOULDER ARTHROSCOPY WITH SUBACROMIAL DECOMPRESSION AND DISTAL CLAVICLE EXCISION biceps tenotomy possible rotator cuff repair (Right) - with interscalene block as a surgical intervention.  The patient's history has been reviewed, patient examined, no change in status, stable for surgery.  I have reviewed the patient's chart and labs.  Questions were answered to the patient's satisfaction.     Hilberto Burzynski ANDREW

## 2020-11-19 NOTE — Progress Notes (Signed)
Assisted Dr. Rose with right, ultrasound guided, interscalene  block. Side rails up, monitors on throughout procedure. See vital signs in flow sheet. Tolerated Procedure well. 

## 2020-11-19 NOTE — H&P (Signed)
Brooke [name] is an 50 y.o. female.   Chief Complaint: Right shoulder pain HPI: Pleasant 50 year old female with right shoulder pain that has been progressivly worsening over the past few months. She has failed conservative management at this time. She has a partial rotator cuff tear. She has elected to proceed with surgical management.  Past Medical History:  Diagnosis Date   Anxiety    Arthritis    right shoulder   Bursitis    Constipation    Depression    Fatty liver    seen on [place] 04-2018   HLD (hyperlipidemia)    Hyperglycemia 03/05/2013   Hypotension    Incontinence    Lower extremity edema    Lumbar stress fracture    Obesity    Osteoporosis    SOB (shortness of breath)     Past Surgical History:  Procedure Laterality Date   COLONOSCOPY     EXCISION/RELEASE BURSA HIP Right 04/11/2019   Procedure: Right hip bursectomy;  Surgeon: [name], MD;  Location: [facility];  Service: Orthopedics;  Laterality: Right;  63min   RHINOPLASTY     SINUS ENDO W/FUSION     TONSILLECTOMY      Family History  Problem Relation Age of Onset   Colon polyps Mother    Thyroid disease Mother    Hyperlipidemia Mother    Diabetes Mother    Hypertension Mother    Diabetes Father    Hypertension Father    Hyperlipidemia Father    Mental illness Sister    Diabetes Paternal Aunt    Hypertension Paternal Uncle    Diabetes Paternal Grandmother    Hypertension Paternal Grandmother    Hyperlipidemia Paternal Grandmother    Colon cancer Neg Hx    Esophageal cancer Neg Hx    Rectal cancer Neg Hx    Stomach cancer Neg Hx    Social History:  reports that she has never smoked. She has never used smokeless tobacco. She reports that she does not drink alcohol and does not use drugs.  Allergies: No Known Allergies  Medications Prior to Admission  Medication Sig Dispense Refill   acetaminophen (TYLENOL) 500 MG tablet Take 1,000 mg by mouth every 6 (six) hours as needed for moderate  pain.     ALPRAZolam (XANAX XR) 1 MG 24 hr tablet Take 1 tablet every morning and 2 tablets every evening (Patient taking differently: Take 1 mg by mouth See admin instructions. Take 1 mg by mouth in the evening, may take second 1 mg dose during the day as needed for anxiety) 270 tablet 1   AMBULATORY NON FORMULARY MEDICATION Medication Name: Diltiazem 2% compound with Lidocaine 5% (Patient taking differently: Place 1 application rectally 3 (three) times daily. Medication Name: Diltiazem 2% compound with Lidocaine 5%) 30 g 0   ARIPiprazole (ABILIFY) 10 MG tablet TAKE 1 TABLET (10 MG TOTAL) BY MOUTH DAILY. (Patient taking differently: Take 10 mg by mouth daily.) 90 tablet 1   Calcium Carb-Cholecalciferol (CALCIUM 600+D) 600-800 MG-UNIT TABS Take 2 tablets by mouth daily.     ibuprofen (ADVIL) 200 MG tablet Take 800 mg by mouth every 6 (six) hours as needed for moderate pain or headache.     Melatonin 5 MG CHEW Chew 10 mg by mouth daily.     PARoxetine (PAXIL) 40 MG tablet TAKE 2 TABLETS (80 MG TOTAL) BY MOUTH AT BEDTIME. (Patient taking differently: Take 80 mg by mouth at bedtime.) 180 tablet 1   polyethylene  glycol (MIRALAX / GLYCOLAX) 17 g packet Take 17 g by mouth daily.     Semaglutide, 1 MG/DOSE, 4 MG/3ML SOPN Inject 1 mg into the skin once a week. (Patient taking differently: Inject 1 mg into the skin every Sunday.) 3 mL 3   topiramate (TOPAMAX) 50 MG tablet TAKE 2 TABLETS (100 MG TOTAL) BY MOUTH 2 (TWO) TIMES DAILY. (Patient taking differently: Take 100 mg by mouth 2 (two) times daily.) 360 tablet 1   traZODone (DESYREL) 50 MG tablet TAKE 2 TABLETS (100 MG TOTAL) BY MOUTH AT BEDTIME. (Patient taking differently: Take 100 mg by mouth at bedtime as needed for sleep.) 180 tablet 1   Cholecalciferol 1.25 MG (50000 UT) capsule TAKE 1 CAPSULE BY MOUTH EVERY WEEK FOR 12 WEEKS 12 capsule 0   fluticasone (FLONASE) 50 MCG/ACT nasal spray Place 1 spray into both nostrils daily. 15 mL 2   propranolol  (INDERAL) 10 MG tablet Take 5 mg by mouth daily as needed (anxiety).     zolpidem (AMBIEN) 10 MG tablet TAKE 1 TABLET (10 MG TOTAL) BY MOUTH AT BEDTIME AS NEEDED FOR SLEEP. (Patient taking differently: Take 10 mg by mouth at bedtime as needed for sleep.) 90 tablet 1    Results for orders placed or performed during the hospital encounter of 11/19/20 (from the past 48 hour(s))  Comprehensive metabolic panel     Status: Abnormal   Collection Time: 11/19/20  9:07 AM  Result Value Ref Range   Sodium 142 135 - 145 mmol/L   Potassium 4.0 3.5 - 5.1 mmol/L   Chloride 110 98 - 111 mmol/L   CO2 25 22 - 32 mmol/L   Glucose, Bld 84 70 - 99 mg/dL    Comment: Glucose reference range applies only to samples taken after fasting for at least 8 hours.   BUN 18 6 - 20 mg/dL   Creatinine, Ser 0.88 0.44 - 1.00 mg/dL   Calcium 9.0 8.9 - 10.3 mg/dL   Total Protein 6.2 (L) 6.5 - 8.1 g/dL   Albumin 3.9 3.5 - 5.0 g/dL   AST 20 15 - 41 U/L   ALT 27 0 - 44 U/L   Alkaline Phosphatase 81 38 - 126 U/L   Total Bilirubin 0.4 0.3 - 1.2 mg/dL   GFR, Estimated >60 >60 mL/min    Comment: (NOTE) Calculated using the CKD-EPI Creatinine Equation (2021)    Anion gap 7 5 - 15    Comment: Performed at [facility], [place] [address], [place], [place] [address]   No results found.  Review of Systems  All other systems reviewed and are negative.  Blood pressure 111/78, pulse 73, temperature 97.9 F (36.6 C), temperature source Oral, resp. rate (!) 22, height 5\' 4"  (1.626 m), weight 62.1 kg, last menstrual period 04/03/2018, SpO2 100 %. Physical Exam Constitutional:      Appearance: Normal appearance.  HENT:     Head: Atraumatic.  Musculoskeletal:     Comments: On examination of the right shoulder, there are normal contours and landmarks. AC joint is tender to touch. Painful provocative maneuvers. Biceps is positive. Labral sides are equivocal. She has good strength with external rotation, but she does  give way at the end. Active range of motion, forward flexion 120 degrees, abduction 110 degrees. Passive range of motion, forward flexion 160 degrees, abduction 160 degrees.   Skin:    General: Skin is warm and dry.  Neurological:     Mental Status: She is alert.  MRI scan of the right shoulder is reviewed. She has got some AC joint arthritis, type II acromion, but she is downsloping laterally. She has some subscap tendinosis. Biceps is subluxed medially.  Assessment/Plan Right shoulder AC OA, RCIS, tendonosis: Surgical vs nonsurgical management discussed. Patient has failed conservative management. She would like to proceed with surgery at this time. All risks and benefits discussed.We will proceed with surgery.  Drue Novel, PA 11/19/2020, 10:54 AM

## 2020-11-19 NOTE — Anesthesia Procedure Notes (Signed)
Anesthesia Procedure Image    

## 2020-11-19 NOTE — Transfer of Care (Signed)
Immediate Anesthesia Transfer of Care Note  Patient: QUINTAVIA ROGSTAD  Procedure(s) Performed: SHOULDER ARTHROSCOPY WITH SUBACROMIAL DECOMPRESSION AND DISTAL CLAVICLE EXCISION BICEPS TENOTOMY (Right: Shoulder)  Patient Location: PACU  Anesthesia Type:GA combined with regional for post-op pain  Level of Consciousness: awake, drowsy and responds to stimulation  Airway & Oxygen Therapy: Patient Spontanous Breathing and Patient connected to face mask oxygen  Post-op Assessment: Report given to RN and Post -op Vital signs reviewed and stable  Post vital signs: Reviewed and stable  Last Vitals:  Vitals Value Taken Time  BP 104/63 11/19/20 1222  Temp    Pulse 71 11/19/20 1223  Resp 5 11/19/20 1223  SpO2 98 % 11/19/20 1223  Vitals shown include unvalidated device data.  Last Pain:  Vitals:   11/19/20 0852  TempSrc:   PainSc: 3       Patients Stated Pain Goal: 5 (49/75/30 0511)  Complications: No notable events documented.

## 2020-12-03 ENCOUNTER — Other Ambulatory Visit (HOSPITAL_BASED_OUTPATIENT_CLINIC_OR_DEPARTMENT_OTHER): Payer: Self-pay

## 2020-12-03 ENCOUNTER — Encounter (INDEPENDENT_AMBULATORY_CARE_PROVIDER_SITE_OTHER): Payer: Self-pay | Admitting: Family Medicine

## 2020-12-03 ENCOUNTER — Other Ambulatory Visit: Payer: Self-pay

## 2020-12-03 ENCOUNTER — Ambulatory Visit (INDEPENDENT_AMBULATORY_CARE_PROVIDER_SITE_OTHER): Payer: No Typology Code available for payment source | Admitting: Family Medicine

## 2020-12-03 VITALS — BP 99/70 | HR 86 | Temp 98.0°F | Ht 64.0 in | Wt 136.0 lb

## 2020-12-03 DIAGNOSIS — M25511 Pain in right shoulder: Secondary | ICD-10-CM

## 2020-12-03 DIAGNOSIS — Z6831 Body mass index (BMI) 31.0-31.9, adult: Secondary | ICD-10-CM

## 2020-12-03 DIAGNOSIS — Z9189 Other specified personal risk factors, not elsewhere classified: Secondary | ICD-10-CM

## 2020-12-03 DIAGNOSIS — G8929 Other chronic pain: Secondary | ICD-10-CM

## 2020-12-03 DIAGNOSIS — E669 Obesity, unspecified: Secondary | ICD-10-CM

## 2020-12-03 DIAGNOSIS — E559 Vitamin D deficiency, unspecified: Secondary | ICD-10-CM

## 2020-12-03 DIAGNOSIS — R7301 Impaired fasting glucose: Secondary | ICD-10-CM

## 2020-12-03 MED ORDER — SEMAGLUTIDE (1 MG/DOSE) 4 MG/3ML ~~LOC~~ SOPN
1.0000 mg | PEN_INJECTOR | SUBCUTANEOUS | 3 refills | Status: DC
  Filled 2020-12-03: qty 3, 28d supply, fill #0
  Filled 2020-12-21: qty 9, 84d supply, fill #0

## 2020-12-03 MED ORDER — CHOLECALCIFEROL 1.25 MG (50000 UT) PO CAPS
ORAL_CAPSULE | ORAL | 0 refills | Status: DC
  Filled 2020-12-03: qty 12, fill #0

## 2020-12-08 NOTE — Progress Notes (Signed)
Chief Complaint:   OBESITY Brooke [name] is here to discuss her progress with her obesity treatment plan along with follow-up of her obesity related diagnoses.   Today's visit was #: 19 Starting weight: 185 lbs Starting date: 12/13/2019 Today's weight: 136 lbs Today's date: 12/03/2020 Weight change since last visit: 1 lb Total lbs lost to date: 49 lbs Body mass index is 23.34 kg/m.  Total weight loss percentage to date: -26.45%  Interim History:  Brooke [name] is status post right shoulder surgery x 2 weeks ago.  She has a follow-up with her surgeon today.  She says she is maintaining her calories/protein.  Current Meal Plan: keeping a food journal and adhering to recommended goals of 1700 calories and 100 grams of protein for 95% of the time.  Current Exercise Plan: None at this time. Current Anti-Obesity Medications: Ozempic 1 mg subcutaneously weekly. Side effects: None.  Assessment/Plan:   1. Impaired fasting glucose with polyphagia Current treatment: Ozempic 1 mg subcutaneously weekly. She will continue to focus on protein-rich, low simple carbohydrate foods. We reviewed the importance of hydration, regular exercise for stress reduction, and restorative sleep.  Plan:  Continue Ozempic 1 mg subcutaneously weekly.  - Refill Semaglutide, 1 MG/DOSE, 4 MG/3ML SOPN; Inject 1 mg into the skin once a week.  Dispense: 3 mL; Refill: 3  2. Vitamin D deficiency Not at goal.  She is taking vitamin D 50,000 IU weekly.  Plan: Continue to take prescription Vitamin D '@50'$ ,000 IU every week as prescribed.  Follow-up for routine testing of Vitamin D, at least 2-3 times per year to avoid over-replacement.  Lab Results  Component Value Date   VD25OH 24.3 (L) 08/21/2020   VD25OH 28.5 (L) 04/08/2020   VD25OH 19.0 (L) 12/13/2019   - Refill Cholecalciferol 1.25 MG (50000 UT) capsule; TAKE 1 CAPSULE BY MOUTH EVERY WEEK FOR 12 WEEKS  Dispense: 12 capsule; Refill: 0  3. Chronic right shoulder  pain Brooke [name] had right shoulder surgery 2 weeks ago and will be following up with her surgeon today. We will continue to monitor symptoms as they relate to her weight loss journey.  4. At risk for heart disease Due to Brooke [name]'s current state of health and medical condition(s), she is at a higher risk for heart disease.  This puts the patient at much greater risk to subsequently develop cardiopulmonary conditions that can significantly affect patient's quality of life in a negative manner.    At least 8 minutes were spent on counseling Brooke [name] about these concerns today. Evidence-based interventions for health behavior change were utilized today including the discussion of self monitoring techniques, problem-solving barriers, and SMART goal setting techniques.  Specifically, regarding patient's less desirable eating habits and patterns, we employed the technique of small changes when Brooke [name] has not been able to fully commit to her prudent nutritional plan.  5. Obesity, current BMI 23.5  Course: Brooke [name] is currently in the action stage of change. As such, her goal is to continue with weight loss efforts.   Nutrition goals: She has agreed to keeping a food journal and adhering to recommended goals of 1700 calories and 100 grams of protein.   Exercise goals:  As is.  Behavioral modification strategies: increasing lean protein intake and increasing water intake.  Brooke [name] has agreed to follow-up with our clinic in 4-6 weeks. She was informed of the importance of frequent follow-up visits to maximize her success with intensive lifestyle modifications for her multiple health conditions.   Objective:  Blood pressure 99/70, pulse 86, temperature 98 F (36.7 C), temperature source Oral, height '5\' 4"'$  (1.626 m), weight 136 lb (61.7 kg), last menstrual period 04/03/2018, SpO2 98 %. Body mass index is 23.34 kg/m.  General: Cooperative, alert, well developed, in no acute distress. HEENT:  Conjunctivae and lids unremarkable. Cardiovascular: Regular rhythm.  Lungs: Normal work of breathing. Neurologic: No focal deficits.   Lab Results  Component Value Date   CREATININE 0.88 11/19/2020   BUN 18 11/19/2020   NA 142 11/19/2020   K 4.0 11/19/2020   CL 110 11/19/2020   CO2 25 11/19/2020   Lab Results  Component Value Date   ALT 27 11/19/2020   AST 20 11/19/2020   ALKPHOS 81 11/19/2020   BILITOT 0.4 11/19/2020   Lab Results  Component Value Date   HGBA1C 5.5 12/13/2019   HGBA1C 5.5 03/05/2013   Lab Results  Component Value Date   INSULIN 13.6 08/21/2020   INSULIN 12.0 12/13/2019   Lab Results  Component Value Date   TSH 3.960 12/13/2019   Lab Results  Component Value Date   CHOL 194 08/21/2020   HDL 50 08/21/2020   LDLCALC 117 (H) 08/21/2020   LDLDIRECT 130.6 09/05/2009   TRIG 151 (H) 08/21/2020   CHOLHDL 3.9 08/21/2020   Lab Results  Component Value Date   VD25OH 24.3 (L) 08/21/2020   VD25OH 28.5 (L) 04/08/2020   VD25OH 19.0 (L) 12/13/2019   Lab Results  Component Value Date   WBC 5.5 11/19/2020   HGB 12.9 11/19/2020   HCT 38.7 11/19/2020   MCV 94.2 11/19/2020   PLT 177 11/19/2020   Lab Results  Component Value Date   IRON 128 08/21/2020   TIBC 256 08/21/2020   FERRITIN 198 (H) 08/21/2020   Attestation Statements:   Reviewed by clinician on day of visit: allergies, medications, problem list, medical history, surgical history, family history, social history, and previous encounter notes.  I, Water quality scientist, CMA, am acting as transcriptionist for Briscoe Deutscher, DO  I have reviewed the above documentation for accuracy and completeness, and I agree with the above. Briscoe Deutscher, DO

## 2020-12-11 ENCOUNTER — Other Ambulatory Visit (HOSPITAL_BASED_OUTPATIENT_CLINIC_OR_DEPARTMENT_OTHER): Payer: Self-pay

## 2020-12-22 ENCOUNTER — Other Ambulatory Visit (HOSPITAL_BASED_OUTPATIENT_CLINIC_OR_DEPARTMENT_OTHER): Payer: Self-pay

## 2021-01-02 ENCOUNTER — Other Ambulatory Visit (HOSPITAL_BASED_OUTPATIENT_CLINIC_OR_DEPARTMENT_OTHER): Payer: Self-pay

## 2021-01-02 MED ORDER — METHOCARBAMOL 500 MG PO TABS
ORAL_TABLET | ORAL | 0 refills | Status: DC
  Filled 2021-01-02: qty 30, 8d supply, fill #0

## 2021-01-12 ENCOUNTER — Other Ambulatory Visit (HOSPITAL_BASED_OUTPATIENT_CLINIC_OR_DEPARTMENT_OTHER): Payer: Self-pay

## 2021-01-15 ENCOUNTER — Ambulatory Visit (INDEPENDENT_AMBULATORY_CARE_PROVIDER_SITE_OTHER): Payer: No Typology Code available for payment source | Admitting: Family Medicine

## 2021-01-27 ENCOUNTER — Other Ambulatory Visit (HOSPITAL_BASED_OUTPATIENT_CLINIC_OR_DEPARTMENT_OTHER): Payer: Self-pay

## 2021-02-23 ENCOUNTER — Encounter (INDEPENDENT_AMBULATORY_CARE_PROVIDER_SITE_OTHER): Payer: Self-pay

## 2021-02-23 ENCOUNTER — Ambulatory Visit (INDEPENDENT_AMBULATORY_CARE_PROVIDER_SITE_OTHER): Payer: No Typology Code available for payment source | Admitting: Family Medicine

## 2021-03-24 ENCOUNTER — Other Ambulatory Visit (HOSPITAL_BASED_OUTPATIENT_CLINIC_OR_DEPARTMENT_OTHER): Payer: Self-pay

## 2021-03-24 ENCOUNTER — Other Ambulatory Visit: Payer: Self-pay | Admitting: Psychiatry

## 2021-03-24 DIAGNOSIS — F411 Generalized anxiety disorder: Secondary | ICD-10-CM

## 2021-03-24 DIAGNOSIS — F4001 Agoraphobia with panic disorder: Secondary | ICD-10-CM

## 2021-03-24 NOTE — Telephone Encounter (Signed)
Pt called reporting Xanax Rx was sent as 1 mg. S/B 0.5 mg. Stated she was wondering why she was so groggy.

## 2021-03-25 ENCOUNTER — Other Ambulatory Visit (HOSPITAL_BASED_OUTPATIENT_CLINIC_OR_DEPARTMENT_OTHER): Payer: Self-pay

## 2021-03-25 MED ORDER — ALPRAZOLAM ER 0.5 MG PO TB24
ORAL_TABLET | ORAL | 0 refills | Status: DC
Start: 2021-03-25 — End: 2021-12-14
  Filled 2021-03-25: qty 270, 90d supply, fill #0

## 2021-03-31 ENCOUNTER — Ambulatory Visit (INDEPENDENT_AMBULATORY_CARE_PROVIDER_SITE_OTHER): Payer: No Typology Code available for payment source | Admitting: Family Medicine

## 2021-03-31 ENCOUNTER — Other Ambulatory Visit (HOSPITAL_BASED_OUTPATIENT_CLINIC_OR_DEPARTMENT_OTHER): Payer: Self-pay

## 2021-03-31 ENCOUNTER — Other Ambulatory Visit: Payer: Self-pay

## 2021-03-31 ENCOUNTER — Encounter (INDEPENDENT_AMBULATORY_CARE_PROVIDER_SITE_OTHER): Payer: Self-pay | Admitting: Family Medicine

## 2021-03-31 VITALS — BP 103/67 | HR 105 | Temp 98.0°F | Ht 64.0 in | Wt 140.0 lb

## 2021-03-31 DIAGNOSIS — E782 Mixed hyperlipidemia: Secondary | ICD-10-CM

## 2021-03-31 DIAGNOSIS — E669 Obesity, unspecified: Secondary | ICD-10-CM

## 2021-03-31 DIAGNOSIS — R7301 Impaired fasting glucose: Secondary | ICD-10-CM

## 2021-03-31 DIAGNOSIS — Z6831 Body mass index (BMI) 31.0-31.9, adult: Secondary | ICD-10-CM

## 2021-03-31 DIAGNOSIS — R5383 Other fatigue: Secondary | ICD-10-CM

## 2021-03-31 DIAGNOSIS — E559 Vitamin D deficiency, unspecified: Secondary | ICD-10-CM

## 2021-03-31 MED ORDER — WEGOVY 2.4 MG/0.75ML ~~LOC~~ SOAJ
2.4000 mg | SUBCUTANEOUS | 0 refills | Status: DC
  Filled 2021-03-31: qty 9, 84d supply, fill #0

## 2021-04-01 ENCOUNTER — Encounter (INDEPENDENT_AMBULATORY_CARE_PROVIDER_SITE_OTHER): Payer: Self-pay

## 2021-04-01 ENCOUNTER — Telehealth (INDEPENDENT_AMBULATORY_CARE_PROVIDER_SITE_OTHER): Payer: Self-pay | Admitting: Family Medicine

## 2021-04-01 ENCOUNTER — Other Ambulatory Visit (HOSPITAL_BASED_OUTPATIENT_CLINIC_OR_DEPARTMENT_OTHER): Payer: Self-pay

## 2021-04-01 NOTE — Telephone Encounter (Signed)
Prior authorization approved for [facility]. Patient sent mychart message.

## 2021-04-02 ENCOUNTER — Other Ambulatory Visit (HOSPITAL_BASED_OUTPATIENT_CLINIC_OR_DEPARTMENT_OTHER): Payer: Self-pay

## 2021-04-02 NOTE — Progress Notes (Signed)
Chief Complaint:   OBESITY Brooke [name] is here to discuss her progress with her obesity treatment plan along with follow-up of her obesity related diagnoses. See Medical Weight Management Flowsheet for complete bioelectrical impedance results.  Today's visit was #: 14 Starting weight: 185 lbs Starting date: 12/13/2019 Weight change since last visit: +4 lbs Total lbs lost to date: 45 lbs Total weight loss percentage to date: -24.32%  Nutrition Plan: Practicing portion control and making smarter food choices, such as increasing vegetables and decreasing simple carbohydrates for 90% of the time.  Activity: Walking for 30 minutes 1 time per week.  Anti-obesity medications: semaglutide 1 mg subcutaneously weekly. Reported side effects: None.  Interim History: Brooke [name] is up in weight and endorses hyperphagia.  She is due for labs and will be able to come in during the next 1-2 weeks.  Assessment/Plan:   1. Impaired fasting glucose with polyphagia Not at goal. Current treatment: semaglutide 1 mg subcutaneously weekly.    Plan: She will continue to focus on protein-rich, low simple carbohydrate foods. We reviewed the importance of hydration, regular exercise for stress reduction, and restorative sleep.  Will check future labs.  - Start Semaglutide-Weight Management (WEGOVY) 2.4 MG/0.75ML SOAJ; Inject 2.4 mg into the skin once a week.  Dispense: 9 mL; Refill: 0 - Comprehensive metabolic panel - Insulin, random  2. Vitamin D deficiency Not at goal. She is taking vitamin D 50,000 IU weekly.  Plan: Continue to take prescription Vitamin D @50 ,000 IU every week as prescribed.  Will check vitamin D level.  Lab Results  Component Value Date   VD25OH 24.3 (L) 08/21/2020   VD25OH 28.5 (L) 04/08/2020   VD25OH 19.0 (L) 12/13/2019   - VITAMIN D 25 Hydroxy (Vit-D Deficiency, Fractures)  3. Mixed hyperlipidemia Course: Not at goal. Lipid-lowering medications: None.   Plan: Dietary  changes: Increase soluble fiber, decrease simple carbohydrates, decrease saturated fat. Exercise changes: Moderate to vigorous-intensity aerobic activity 150 minutes per week or as tolerated. We will continue to monitor along with PCP as it pertains to her weight loss journey.  Lab Results  Component Value Date   CHOL 194 08/21/2020   HDL 50 08/21/2020   LDLCALC 117 (H) 08/21/2020   LDLDIRECT 130.6 09/05/2009   TRIG 151 (H) 08/21/2020   CHOLHDL 3.9 08/21/2020   Lab Results  Component Value Date   ALT 27 11/19/2020   AST 20 11/19/2020   ALKPHOS 81 11/19/2020   BILITOT 0.4 11/19/2020   The 10-year ASCVD risk score (Arnett DK, et al., 2019) is: 1%   Values used to calculate the score:     Age: 50 years     Sex: Female     Is Non-Hispanic African American: No     Diabetic: No     Tobacco smoker: No     Systolic Blood Pressure: 595 mmHg     Is BP treated: No     HDL Cholesterol: 50 mg/dL     Total Cholesterol: 194 mg/dL  - Lipid panel  4. Other fatigue Will check labs, as per below.  - Anemia panel - CBC with Differential/Platelet - TSH  5. Obesity, current BMI 24.1  Course: Brooke [name] is currently in the action stage of change. As such, her goal is to continue with weight loss efforts.   Nutrition goals: She has agreed to practicing portion control and making smarter food choices, such as increasing vegetables and decreasing simple carbohydrates.   Exercise goals:  As is.  Behavioral modification strategies: increasing lean protein intake, decreasing simple carbohydrates, increasing vegetables, increasing water intake, and decreasing liquid calories.  Brooke [name] has agreed to follow-up with our clinic in 4 weeks. She was informed of the importance of frequent follow-up visits to maximize her success with intensive lifestyle modifications for her multiple health conditions.   Objective:   Blood pressure 103/67, pulse (!) 105, temperature 98 F (36.7 C), temperature  source Oral, height 5\' 4"  (1.626 m), weight 140 lb (63.5 kg), last menstrual period 04/03/2018, SpO2 100 %. Body mass index is 24.03 kg/m.  General: Cooperative, alert, well developed, in no acute distress. HEENT: Conjunctivae and lids unremarkable. Cardiovascular: Regular rhythm.  Lungs: Normal work of breathing. Neurologic: No focal deficits.   Lab Results  Component Value Date   CREATININE 0.88 11/19/2020   BUN 18 11/19/2020   NA 142 11/19/2020   K 4.0 11/19/2020   CL 110 11/19/2020   CO2 25 11/19/2020   Lab Results  Component Value Date   ALT 27 11/19/2020   AST 20 11/19/2020   ALKPHOS 81 11/19/2020   BILITOT 0.4 11/19/2020   Lab Results  Component Value Date   HGBA1C 5.5 12/13/2019   HGBA1C 5.5 03/05/2013   Lab Results  Component Value Date   INSULIN 13.6 08/21/2020   INSULIN 12.0 12/13/2019   Lab Results  Component Value Date   TSH 3.960 12/13/2019   Lab Results  Component Value Date   CHOL 194 08/21/2020   HDL 50 08/21/2020   LDLCALC 117 (H) 08/21/2020   LDLDIRECT 130.6 09/05/2009   TRIG 151 (H) 08/21/2020   CHOLHDL 3.9 08/21/2020   Lab Results  Component Value Date   VD25OH 24.3 (L) 08/21/2020   VD25OH 28.5 (L) 04/08/2020   VD25OH 19.0 (L) 12/13/2019   Lab Results  Component Value Date   WBC 5.5 11/19/2020   HGB 12.9 11/19/2020   HCT 38.7 11/19/2020   MCV 94.2 11/19/2020   PLT 177 11/19/2020   Lab Results  Component Value Date   IRON 128 08/21/2020   TIBC 256 08/21/2020   FERRITIN 198 (H) 08/21/2020   Attestation Statements:   Reviewed by clinician on day of visit: allergies, medications, problem list, medical history, surgical history, family history, social history, and previous encounter notes.  I, Water quality scientist, CMA, am acting as transcriptionist for Briscoe Deutscher, DO  I have reviewed the above documentation for accuracy and completeness, and I agree with the above. -  Briscoe Deutscher, DO, MS, FAAFP, DABOM - Family and Bariatric  Medicine.

## 2021-04-09 ENCOUNTER — Encounter: Payer: Self-pay | Admitting: Psychiatry

## 2021-04-09 ENCOUNTER — Ambulatory Visit (INDEPENDENT_AMBULATORY_CARE_PROVIDER_SITE_OTHER): Payer: No Typology Code available for payment source | Admitting: Psychiatry

## 2021-04-09 ENCOUNTER — Other Ambulatory Visit: Payer: Self-pay

## 2021-04-09 ENCOUNTER — Other Ambulatory Visit (HOSPITAL_BASED_OUTPATIENT_CLINIC_OR_DEPARTMENT_OTHER): Payer: Self-pay

## 2021-04-09 DIAGNOSIS — F3342 Major depressive disorder, recurrent, in full remission: Secondary | ICD-10-CM

## 2021-04-09 DIAGNOSIS — F5105 Insomnia due to other mental disorder: Secondary | ICD-10-CM | POA: Diagnosis not present

## 2021-04-09 DIAGNOSIS — F411 Generalized anxiety disorder: Secondary | ICD-10-CM | POA: Diagnosis not present

## 2021-04-09 DIAGNOSIS — F4001 Agoraphobia with panic disorder: Secondary | ICD-10-CM

## 2021-04-09 DIAGNOSIS — G4726 Circadian rhythm sleep disorder, shift work type: Secondary | ICD-10-CM

## 2021-04-09 MED ORDER — TOPIRAMATE 50 MG PO TABS
ORAL_TABLET | ORAL | 1 refills | Status: DC
  Filled 2021-04-09: qty 360, 90d supply, fill #0
  Filled 2021-07-29: qty 360, 90d supply, fill #1

## 2021-04-09 MED ORDER — PROPRANOLOL HCL 20 MG PO TABS
20.0000 mg | ORAL_TABLET | Freq: Every day | ORAL | 2 refills | Status: DC | PRN
Start: 2021-04-09 — End: 2022-07-05
  Filled 2021-04-09: qty 60, 30d supply, fill #0
  Filled 2021-12-08: qty 60, 30d supply, fill #1
  Filled 2022-03-29: qty 60, 30d supply, fill #2

## 2021-04-09 MED ORDER — PAROXETINE HCL 40 MG PO TABS
ORAL_TABLET | ORAL | 1 refills | Status: DC
  Filled 2021-04-09: qty 180, 90d supply, fill #0
  Filled 2021-07-16: qty 180, 90d supply, fill #1

## 2021-04-09 MED ORDER — ARIPIPRAZOLE 10 MG PO TABS
ORAL_TABLET | Freq: Every day | ORAL | 1 refills | Status: DC
  Filled 2021-04-09: qty 90, 90d supply, fill #0
  Filled 2021-07-29: qty 90, 90d supply, fill #1

## 2021-04-09 NOTE — Progress Notes (Signed)
IVAL BASQUEZ [id number] 12/14/1970 50 y.o.  Subjective:   Patient ID:  Brooke [name] is a 50 y.o. (DOB 23-Jul-1970) female.  Chief Complaint:  Chief Complaint  Patient presents with   Follow-up   Anxiety   Sleeping Problem    HPI Brooke [name] presents to the office today for follow-up of anxiety and insomnia  seen December 2019.  For anxiety and weight gain topiramate was increased off label to 200 mg daily.  For anxiety propranolol 20 to 40 mg twice daily was added as needed.    seen November 2020: No meds were changed.  The following was noted: OK except for life events.  Still a lot of worry. Managed normally.  H agrees also.  Son's wife pregnant with another man's baby.  Real busy with 60 hours weekly.  Can't take Ambien at times bc of that.  Realizes she needs to take better self care.   Marland Kitchen August 23, 2019 appointment, the following is noted: Still doing well with meds.  Cont intermittent problems with work affecting sleep patterns and which sleep meds she can take.  Satisfied with meds.  No panic.  depression and anxiety manageable not gone.  Panic worse if overworked DT short staffed.  Patient reports stable mood and denies depressed or irritable moods.  Patient reports occasional difficulty with anxiety.  Patient reports occasionally difficulty with sleep initiation or maintenance. Denies appetite disturbance.  Patient reports that energy and motivation have been good.  Patient denies any difficulty with concentration.  Patient denies any suicidal ideation.  04/10/20 appt with following noted: Lost 33# at [facility] on 1000 calorie diet and Wegovy. A little extra anxiety lately without any specific reason for 2-3 mos.  No panic.  Mostly worry  Sx without avoidance.  Able to enjoy things normally.  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   Patient reports stable mood and denies  depressed or irritable moods.  Patient denies difficulty with sleep initiation or maintenance. Denies appetite disturbance.  Patient reports that energy and motivation have been good.  Patient denies any difficulty with concentration.  Patient denies any suicidal ideation. Plan: Continue alprazolam XR 0.5 mg every morning and 1 mg nightly increase aripiprazole 10 mg daily to see anxiety is better Continue paroxetine 80 mg daily Continue propranolol 20 mg tablets 1-2 twice daily as needed anxiety Continue topiramate 50 mg tablets 2 twice daily Continue trazodone 100 mg nightly Continue zolpidem 5 to 10 mg nightly as needed insomnia She typically will alternate trazodone or Ambien depending on her work schedule.  10/09/20 appt noted: Good.  Lost 50# pounds and feels a whole lot better. Using [facility] Potential shoulder surgery. No panic.  Anxiety has been controlled.  Not depressed.   Increase in Abilify helped that a lot. No SE. Still shift work.  Low vitamin D.   No concerns about meds and no reason for changes. Doing well. Plan: No med changes  04/09/2021 appointment with the following noted: Good normal stressors worse this time of year. Propranolol used prn is helpful for anxiety physically and tolerated. No SE with meds. Still She typically will alternate trazodone or Ambien depending on her work schedule. Sleep ok with meds mostly. Working dayshift OR, Youth worker.  Long hours. Lost down to 137 from 190#.  Makes her feel better about herself.  On Wegovy.  Dr. Juleen China. No desire for  med changes Will get labs done.  Past Psychiatric Medication Trials: Citalopram, fluoxetine, fluvoxamine, and Anafranil, paroxetine 80, duloxetine Abilify 10 Buspirone Topiramate Ambien, Ambien CR, trazodone Xanax XR  Review of Systems:  Review of Systems  Musculoskeletal:  Positive for arthralgias.       Pending shoulder surgery  Neurological:  Negative for dizziness, tremors and weakness.   Psychiatric/Behavioral:  Positive for sleep disturbance. Negative for agitation, behavioral problems, confusion, decreased concentration, dysphoric mood, hallucinations, self-injury and suicidal ideas. The patient is nervous/anxious. The patient is not hyperactive.    Medications: I have reviewed the patient's current medications.  Current Outpatient Medications  Medication Sig Dispense Refill   acetaminophen (TYLENOL) 500 MG tablet Take 1,000 mg by mouth every 6 (six) hours as needed for moderate pain.     ALPRAZolam (ALPRAZOLAM XR) 0.5 MG 24 hr tablet TAKE ONE TABLET BY MOUTH IN THE MORNING IF NEEDED FOR ANXIETY AND TWO TABLETS AT NIGHT FOR ANXIETY AND SLEEP 270 tablet 0   AMBULATORY NON FORMULARY MEDICATION Medication Name: Diltiazem 2% compound with Lidocaine 5% 30 g 0   Calcium Carb-Cholecalciferol (CALCIUM 600+D) 600-800 MG-UNIT TABS Take 2 tablets by mouth daily.     Cholecalciferol 1.25 MG (50000 UT) capsule TAKE 1 CAPSULE BY MOUTH EVERY WEEK FOR 12 WEEKS 12 capsule 0   ibuprofen (ADVIL) 200 MG tablet Take 800 mg by mouth every 6 (six) hours as needed for moderate pain or headache.     Melatonin 5 MG CHEW Chew 10 mg by mouth daily.     polyethylene glycol (MIRALAX / GLYCOLAX) 17 g packet Take 17 g by mouth daily.     Semaglutide-Weight Management (WEGOVY) 2.4 MG/0.75ML SOAJ Inject 2.4 mg into the skin once a week. 9 mL 0   traZODone (DESYREL) 50 MG tablet TAKE 2 TABLETS (100 MG TOTAL) BY MOUTH AT BEDTIME. 180 tablet 1   zolpidem (AMBIEN) 10 MG tablet TAKE 1 TABLET (10 MG TOTAL) BY MOUTH AT BEDTIME AS NEEDED FOR SLEEP. 90 tablet 1   ARIPiprazole (ABILIFY) 10 MG tablet TAKE 1 TABLET (10 MG TOTAL) BY MOUTH DAILY. 90 tablet 1   methocarbamol (ROBAXIN) 500 MG tablet Take 1 tablet by mouth 4 times daily (Patient not taking: Reported on 04/09/2021) 30 tablet 0   ondansetron (ZOFRAN) 4 MG tablet Take 1 tablet (4 mg total) by mouth daily as needed for nausea or vomiting. (Patient not taking:  Reported on 04/09/2021) 30 tablet 1   PARoxetine (PAXIL) 40 MG tablet TAKE 2 TABLETS (80 MG TOTAL) BY MOUTH AT BEDTIME. 180 tablet 1   propranolol (INDERAL) 20 MG tablet Take 1-2 tablets (20-40 mg total) by mouth daily as needed (anxiety). 60 tablet 2   topiramate (TOPAMAX) 50 MG tablet TAKE 2 TABLETS (100 MG TOTAL) BY MOUTH 2 (TWO) TIMES DAILY. 360 tablet 1   No current facility-administered medications for this visit.    Medication Side Effects: None  Allergies: No Known Allergies  Past Medical History:  Diagnosis Date   Anxiety    Arthritis    right shoulder   Bursitis    Constipation    Depression    Fatty liver    seen on [place] 04-2018   HLD (hyperlipidemia)    Hyperglycemia 03/05/2013   Hypotension    Incontinence    Lower extremity edema    Lumbar stress fracture    Obesity    Osteoporosis    SOB (shortness of breath)     Family History  Problem Relation  Age of Onset   Colon polyps Mother    Thyroid disease Mother    Hyperlipidemia Mother    Diabetes Mother    Hypertension Mother    Diabetes Father    Hypertension Father    Hyperlipidemia Father    Mental illness Sister    Diabetes Paternal Aunt    Hypertension Paternal Uncle    Diabetes Paternal Grandmother    Hypertension Paternal Grandmother    Hyperlipidemia Paternal Grandmother    Colon cancer Neg Hx    Esophageal cancer Neg Hx    Rectal cancer Neg Hx    Stomach cancer Neg Hx     Social History   Socioeconomic History   Marital status: Married    Spouse name: John   Number of children: Not on file   Years of education: 16   Highest education level: Not on file  Occupational History   Occupation: RN/short stay    Employer: [employer]  Tobacco Use   Smoking status: Never   Smokeless tobacco: Never  Vaping Use   Vaping Use: Never used  Substance and Sexual Activity   Alcohol use: No    Alcohol/week: 0.0 standard drinks   Drug use: No   Sexual activity: Yes    Partners: Male    Birth  control/protection: Surgical    Comment: Husband's vasectomy  Other Topics Concern   Not on file  Social History Narrative   Not on file   Social Determinants of Health   Financial Resource Strain: Not on file  Food Insecurity: Not on file  Transportation Needs: Not on file  Physical Activity: Not on file  Stress: Not on file  Social Connections: Not on file  Intimate Partner Violence: Not on file    Past Medical History, Surgical history, Social history, and Family history were reviewed and updated as appropriate.   Please see review of systems for further details on the patient's review from today.   Objective:   Physical Exam:  LMP 04/03/2018   Physical Exam Constitutional:      General: She is not in acute distress.    Appearance: Normal appearance.  Musculoskeletal:        General: No deformity.  Neurological:     Mental Status: She is alert and oriented to person, place, and time.     Coordination: Coordination normal.     Gait: Gait normal.  Psychiatric:        Attention and Perception: She is attentive.        Mood and Affect: Mood is anxious. Mood is not depressed. Affect is not labile, blunt, angry or inappropriate.        Speech: Speech normal.        Behavior: Behavior normal.        Thought Content: Thought content normal. Thought content does not include homicidal or suicidal ideation. Thought content does not include homicidal or suicidal plan.        Cognition and Memory: Cognition normal.        Judgment: Judgment normal.     Comments: Insight intact. No auditory or visual hallucinations. No delusions.     Lab Review:     Component Value Date/Time   NA 142 11/19/2020 0907   NA 143 08/21/2020 0804   K 4.0 11/19/2020 0907   CL 110 11/19/2020 0907   CO2 25 11/19/2020 0907   GLUCOSE 84 11/19/2020 0907   BUN 18 11/19/2020 0907   BUN 18 08/21/2020 0804  CREATININE 0.88 11/19/2020 0907   CREATININE 0.80 11/24/2018 1503   CALCIUM 9.0 11/19/2020  0907   PROT 6.2 (L) 11/19/2020 0907   PROT 6.6 08/21/2020 0804   ALBUMIN 3.9 11/19/2020 0907   ALBUMIN 4.6 08/21/2020 0804   AST 20 11/19/2020 0907   ALT 27 11/19/2020 0907   ALKPHOS 81 11/19/2020 0907   BILITOT 0.4 11/19/2020 0907   BILITOT 0.3 08/21/2020 0804   GFRNONAA >60 11/19/2020 0907   GFRAA 89 04/08/2020 0851       Component Value Date/Time   WBC 5.5 11/19/2020 1305   RBC 4.11 11/19/2020 1305   HGB 12.9 11/19/2020 1305   HGB 14.6 08/21/2020 0804   HCT 38.7 11/19/2020 1305   HCT 43.8 08/21/2020 0804   PLT 177 11/19/2020 1305   PLT 231 08/21/2020 0804   MCV 94.2 11/19/2020 1305   MCV 92 08/21/2020 0804   MCH 31.4 11/19/2020 1305   MCHC 33.3 11/19/2020 1305   RDW 12.5 11/19/2020 1305   RDW 12.6 08/21/2020 0804   LYMPHSABS 1.5 08/21/2020 0804   MONOABS 0.4 04/10/2019 0930   EOSABS 0.1 08/21/2020 0804   BASOSABS 0.0 08/21/2020 0804    No results found for: POCLITH, LITHIUM   No results found for: PHENYTOIN, PHENOBARB, VALPROATE, CBMZ   .res Assessment: Plan:    Generalized anxiety disorder - Plan: PARoxetine (PAXIL) 40 MG tablet, propranolol (INDERAL) 20 MG tablet, topiramate (TOPAMAX) 50 MG tablet  Panic disorder with agoraphobia - Plan: PARoxetine (PAXIL) 40 MG tablet, propranolol (INDERAL) 20 MG tablet  Insomnia due to mental condition  Major depression, recurrent, full remission ([place]) - Plan: ARIPiprazole (ABILIFY) 10 MG tablet, PARoxetine (PAXIL) 40 MG tablet  Shift work sleep disorder   Overall med combo has worked well for depression and anxiety but is better than last time...  Disc options for benefit more: increase Abilify, topiramate off label, prn propranolol. Disc SE in detail of each option.   Prefer to avoid increasing Xanax which can make HER sleepy in the daytime.    Discussed potential metabolic side effects associated with atypical antipsychotics, as well as potential risk for movement side effects. Advised pt to contact office if movement  side effects occur.   We discussed the short-term risks associated with benzodiazepines including sedation and increased fall risk among others.  Discussed long-term side effect risk including dependence, potential withdrawal symptoms, and the potential eventual dose-related risk of dementia.  Newer studies published 2020 dispute risk of dementia with benzodiazepines.  And disc risk of Ambien amnesia.  Consider switch to Ativan from Xanax to reduce the sedation from Bz.    Better self care and more sleep would help. Protect SLEEP.  cONTINUE topiramate bc help weight gain risk to 200mg  daily.  Disc SE.  Rx prn propranolol 20-40 mg BID for anxiety.   Disc polypharmacy is medcially necessary.  It has taken a lot of meds and combination of mechanisms in order to manage her depression and anxiety symptoms.  She tolerates the higher dose of paroxetine without side effects.  Panic was not managed with less paroxetine.  Disc SE in detail and SSRI withdrawal sx. Discussed serotonin syndrome.  Avoid dextromethorphan, tramadol which can increase risk. Continue alprazolam XR 0.5 mg every morning and 1 mg nightly Continue aripiprazole 10 mg daily  the increase helped anxiety is better Continue paroxetine 80 mg daily Continue propranolol 20 mg tablets 1-2 twice daily as needed anxiety Continue topiramate 50 mg tablets 2 twice daily Continue trazodone  100 mg nightly Continue zolpidem 5 to 10 mg nightly as needed insomnia She typically will alternate trazodone or Ambien depending on her work schedule. No med changes today  This appt was 30 mins.  FU 6 mos  Lynder Parents, MD, DFAPA     Please see After Visit Summary for patient specific instructions.  Future Appointments  Date Time Provider [place]  05/12/2021  2:40 PM Briscoe Deutscher, DO [facility] None    No orders of the defined types were placed in this encounter.     -------------------------------

## 2021-04-10 ENCOUNTER — Other Ambulatory Visit (HOSPITAL_BASED_OUTPATIENT_CLINIC_OR_DEPARTMENT_OTHER): Payer: Self-pay

## 2021-05-12 ENCOUNTER — Ambulatory Visit (INDEPENDENT_AMBULATORY_CARE_PROVIDER_SITE_OTHER): Payer: No Typology Code available for payment source | Admitting: Family Medicine

## 2021-05-19 ENCOUNTER — Other Ambulatory Visit: Payer: Self-pay | Admitting: Psychiatry

## 2021-05-19 DIAGNOSIS — F5105 Insomnia due to other mental disorder: Secondary | ICD-10-CM

## 2021-05-19 MED ORDER — ZOLPIDEM TARTRATE 10 MG PO TABS
ORAL_TABLET | Freq: Every evening | ORAL | 1 refills | Status: DC | PRN
Start: 2021-05-19 — End: 2021-10-08
  Filled 2021-05-19: qty 90, 90d supply, fill #0

## 2021-05-20 ENCOUNTER — Other Ambulatory Visit (HOSPITAL_BASED_OUTPATIENT_CLINIC_OR_DEPARTMENT_OTHER): Payer: Self-pay

## 2021-06-23 ENCOUNTER — Ambulatory Visit (INDEPENDENT_AMBULATORY_CARE_PROVIDER_SITE_OTHER): Payer: No Typology Code available for payment source | Admitting: Family Medicine

## 2021-06-23 ENCOUNTER — Encounter (INDEPENDENT_AMBULATORY_CARE_PROVIDER_SITE_OTHER): Payer: Self-pay

## 2021-07-08 ENCOUNTER — Other Ambulatory Visit (INDEPENDENT_AMBULATORY_CARE_PROVIDER_SITE_OTHER): Payer: Self-pay

## 2021-07-08 ENCOUNTER — Ambulatory Visit (INDEPENDENT_AMBULATORY_CARE_PROVIDER_SITE_OTHER): Payer: No Typology Code available for payment source | Admitting: Family Medicine

## 2021-07-08 ENCOUNTER — Other Ambulatory Visit (HOSPITAL_COMMUNITY): Payer: Self-pay

## 2021-07-08 ENCOUNTER — Encounter (INDEPENDENT_AMBULATORY_CARE_PROVIDER_SITE_OTHER): Payer: Self-pay

## 2021-07-08 ENCOUNTER — Other Ambulatory Visit (HOSPITAL_BASED_OUTPATIENT_CLINIC_OR_DEPARTMENT_OTHER): Payer: Self-pay

## 2021-07-08 DIAGNOSIS — R7301 Impaired fasting glucose: Secondary | ICD-10-CM

## 2021-07-08 MED ORDER — WEGOVY 2.4 MG/0.75ML ~~LOC~~ SOAJ
2.4000 mg | SUBCUTANEOUS | 0 refills | Status: DC
  Filled 2021-07-08 (×2): qty 9, 84d supply, fill #0

## 2021-07-10 LAB — LIPID PANEL
Chol/HDL Ratio: 4.4 ratio (ref 0.0–4.4)
Cholesterol, Total: 204 mg/dL — ABNORMAL HIGH (ref 100–199)
HDL: 46 mg/dL (ref >39)
LDL Chol Calc (NIH): 135 mg/dL — ABNORMAL HIGH (ref 0–99)
Triglycerides: 128 mg/dL (ref 0–149)
VLDL Cholesterol Cal: 23 mg/dL (ref 5–40)

## 2021-07-10 LAB — ANEMIA PANEL
Ferritin: 201 ng/mL — ABNORMAL HIGH (ref 15–150)
Folate, Hemolysate: 425 ng/mL
Folate, RBC: 1042 ng/mL (ref >498)
Hematocrit: 40.8 % (ref 34.0–46.6)
Iron Saturation: 55 % (ref 15–55)
Iron: 129 ug/dL (ref 27–159)
Retic Ct Pct: 1.8 % (ref 0.6–2.6)
Total Iron Binding Capacity: 234 ug/dL — ABNORMAL LOW (ref 250–450)
UIBC: 105 ug/dL — ABNORMAL LOW (ref 131–425)
Vitamin B-12: 600 pg/mL (ref 232–1245)

## 2021-07-10 LAB — VITAMIN D 25 HYDROXY (VIT D DEFICIENCY, FRACTURES): Vit D, 25-Hydroxy: 32.3 ng/mL (ref 30.0–100.0)

## 2021-07-10 LAB — CBC WITH DIFFERENTIAL/PLATELET
Basophils Absolute: 0 10*3/uL (ref 0.0–0.2)
Basos: 0 %
EOS (ABSOLUTE): 0.1 10*3/uL (ref 0.0–0.4)
Eos: 2 %
Hemoglobin: 14.1 g/dL (ref 11.1–15.9)
Immature Grans (Abs): 0 10*3/uL (ref 0.0–0.1)
Immature Granulocytes: 0 %
Lymphocytes Absolute: 1.2 10*3/uL (ref 0.7–3.1)
Lymphs: 38 %
MCH: 31.9 pg (ref 26.6–33.0)
MCHC: 34.6 g/dL (ref 31.5–35.7)
MCV: 92 fL (ref 79–97)
Monocytes Absolute: 0.2 10*3/uL (ref 0.1–0.9)
Monocytes: 8 %
Neutrophils Absolute: 1.6 10*3/uL (ref 1.4–7.0)
Neutrophils: 52 %
Platelets: 206 10*3/uL (ref 150–450)
RBC: 4.42 x10E6/uL (ref 3.77–5.28)
RDW: 12.9 % (ref 11.7–15.4)
WBC: 3.1 10*3/uL — ABNORMAL LOW (ref 3.4–10.8)

## 2021-07-10 LAB — COMPREHENSIVE METABOLIC PANEL
ALT: 21 IU/L (ref 0–32)
AST: 21 IU/L (ref 0–40)
Albumin/Globulin Ratio: 2.5 — ABNORMAL HIGH (ref 1.2–2.2)
Albumin: 4.2 g/dL (ref 3.8–4.9)
Alkaline Phosphatase: 97 IU/L (ref 44–121)
BUN/Creatinine Ratio: 21 (ref 9–23)
BUN: 20 mg/dL (ref 6–24)
Bilirubin Total: <0.2 mg/dL (ref 0.0–1.2)
CO2: 21 mmol/L (ref 20–29)
Calcium: 9 mg/dL (ref 8.7–10.2)
Chloride: 113 mmol/L — ABNORMAL HIGH (ref 96–106)
Creatinine, Ser: 0.94 mg/dL (ref 0.57–1.00)
Globulin, Total: 1.7 g/dL (ref 1.5–4.5)
Glucose: 85 mg/dL (ref 70–99)
Potassium: 4.6 mmol/L (ref 3.5–5.2)
Sodium: 147 mmol/L — ABNORMAL HIGH (ref 134–144)
Total Protein: 5.9 g/dL — ABNORMAL LOW (ref 6.0–8.5)
eGFR: 73 mL/min/{1.73_m2} (ref >59)

## 2021-07-10 LAB — TSH: TSH: 3.31 u[IU]/mL (ref 0.450–4.500)

## 2021-07-10 LAB — INSULIN, RANDOM: INSULIN: 4.3 u[IU]/mL (ref 2.6–24.9)

## 2021-07-16 ENCOUNTER — Encounter (INDEPENDENT_AMBULATORY_CARE_PROVIDER_SITE_OTHER): Payer: Self-pay | Admitting: Family Medicine

## 2021-07-16 ENCOUNTER — Encounter: Payer: Self-pay | Admitting: Family Medicine

## 2021-07-17 ENCOUNTER — Other Ambulatory Visit (HOSPITAL_BASED_OUTPATIENT_CLINIC_OR_DEPARTMENT_OTHER): Payer: Self-pay

## 2021-07-22 ENCOUNTER — Ambulatory Visit (INDEPENDENT_AMBULATORY_CARE_PROVIDER_SITE_OTHER): Payer: No Typology Code available for payment source | Admitting: Family Medicine

## 2021-07-27 ENCOUNTER — Other Ambulatory Visit (HOSPITAL_BASED_OUTPATIENT_CLINIC_OR_DEPARTMENT_OTHER): Payer: Self-pay

## 2021-07-29 ENCOUNTER — Other Ambulatory Visit (HOSPITAL_BASED_OUTPATIENT_CLINIC_OR_DEPARTMENT_OTHER): Payer: Self-pay

## 2021-07-30 ENCOUNTER — Other Ambulatory Visit (HOSPITAL_BASED_OUTPATIENT_CLINIC_OR_DEPARTMENT_OTHER): Payer: Self-pay

## 2021-08-06 ENCOUNTER — Encounter (INDEPENDENT_AMBULATORY_CARE_PROVIDER_SITE_OTHER): Payer: Self-pay | Admitting: Family Medicine

## 2021-08-06 ENCOUNTER — Ambulatory Visit (INDEPENDENT_AMBULATORY_CARE_PROVIDER_SITE_OTHER): Payer: No Typology Code available for payment source | Admitting: Family Medicine

## 2021-08-06 ENCOUNTER — Other Ambulatory Visit (HOSPITAL_BASED_OUTPATIENT_CLINIC_OR_DEPARTMENT_OTHER): Payer: Self-pay

## 2021-08-06 VITALS — BP 97/65 | HR 87 | Temp 97.8°F | Ht 64.0 in | Wt 140.0 lb

## 2021-08-06 DIAGNOSIS — D72818 Other decreased white blood cell count: Secondary | ICD-10-CM | POA: Diagnosis not present

## 2021-08-06 DIAGNOSIS — E559 Vitamin D deficiency, unspecified: Secondary | ICD-10-CM

## 2021-08-06 DIAGNOSIS — E669 Obesity, unspecified: Secondary | ICD-10-CM

## 2021-08-06 DIAGNOSIS — R632 Polyphagia: Secondary | ICD-10-CM | POA: Diagnosis not present

## 2021-08-06 DIAGNOSIS — G4709 Other insomnia: Secondary | ICD-10-CM

## 2021-08-06 DIAGNOSIS — Z6824 Body mass index (BMI) 24.0-24.9, adult: Secondary | ICD-10-CM

## 2021-08-06 DIAGNOSIS — E782 Mixed hyperlipidemia: Secondary | ICD-10-CM

## 2021-08-06 DIAGNOSIS — E878 Other disorders of electrolyte and fluid balance, not elsewhere classified: Secondary | ICD-10-CM

## 2021-08-06 MED ORDER — CHOLECALCIFEROL 1.25 MG (50000 UT) PO CAPS
50000.0000 [IU] | ORAL_CAPSULE | ORAL | 0 refills | Status: DC
  Filled 2021-08-06 – 2021-12-14 (×3): qty 12, 84d supply, fill #0

## 2021-08-06 MED ORDER — WEGOVY 2.4 MG/0.75ML ~~LOC~~ SOAJ
2.4000 mg | SUBCUTANEOUS | 2 refills | Status: AC
  Filled 2021-08-06: qty 9, 84d supply, fill #0
  Filled 2021-10-21 (×2): qty 3, 28d supply, fill #0
  Filled 2021-11-15: qty 3, 28d supply, fill #1
  Filled 2021-12-14: qty 3, 28d supply, fill #2

## 2021-08-07 LAB — CBC WITH DIFFERENTIAL/PLATELET
Basophils Absolute: 0 10*3/uL (ref 0.0–0.2)
Basos: 0 %
EOS (ABSOLUTE): 0 10*3/uL (ref 0.0–0.4)
Eos: 1 %
Hematocrit: 44.5 % (ref 34.0–46.6)
Hemoglobin: 14.7 g/dL (ref 11.1–15.9)
Immature Grans (Abs): 0 10*3/uL (ref 0.0–0.1)
Immature Granulocytes: 0 %
Lymphocytes Absolute: 1.2 10*3/uL (ref 0.7–3.1)
Lymphs: 37 %
MCH: 30.6 pg (ref 26.6–33.0)
MCHC: 33 g/dL (ref 31.5–35.7)
MCV: 93 fL (ref 79–97)
Monocytes Absolute: 0.3 10*3/uL (ref 0.1–0.9)
Monocytes: 9 %
Neutrophils Absolute: 1.7 10*3/uL (ref 1.4–7.0)
Neutrophils: 53 %
Platelets: 210 10*3/uL (ref 150–450)
RBC: 4.81 x10E6/uL (ref 3.77–5.28)
RDW: 12.7 % (ref 11.7–15.4)
WBC: 3.2 10*3/uL — ABNORMAL LOW (ref 3.4–10.8)

## 2021-08-07 LAB — COMPREHENSIVE METABOLIC PANEL
ALT: 24 IU/L (ref 0–32)
AST: 22 IU/L (ref 0–40)
Albumin/Globulin Ratio: 2.4 — ABNORMAL HIGH (ref 1.2–2.2)
Albumin: 4.3 g/dL (ref 3.8–4.9)
Alkaline Phosphatase: 114 IU/L (ref 44–121)
BUN/Creatinine Ratio: 16 (ref 9–23)
BUN: 14 mg/dL (ref 6–24)
Bilirubin Total: 0.2 mg/dL (ref 0.0–1.2)
CO2: 19 mmol/L — ABNORMAL LOW (ref 20–29)
Calcium: 9.3 mg/dL (ref 8.7–10.2)
Chloride: 110 mmol/L — ABNORMAL HIGH (ref 96–106)
Creatinine, Ser: 0.9 mg/dL (ref 0.57–1.00)
Globulin, Total: 1.8 g/dL (ref 1.5–4.5)
Glucose: 80 mg/dL (ref 70–99)
Potassium: 4.4 mmol/L (ref 3.5–5.2)
Sodium: 144 mmol/L (ref 134–144)
Total Protein: 6.1 g/dL (ref 6.0–8.5)
eGFR: 77 mL/min/{1.73_m2} (ref >59)

## 2021-08-11 ENCOUNTER — Ambulatory Visit (INDEPENDENT_AMBULATORY_CARE_PROVIDER_SITE_OTHER): Payer: No Typology Code available for payment source | Admitting: Family Medicine

## 2021-08-11 NOTE — Progress Notes (Signed)
Chief Complaint:   OBESITY Brooke [name] is here to discuss her progress with her obesity treatment plan along with follow-up of her obesity related diagnoses. See Medical Weight Management Flowsheet for complete bioelectrical impedance results.  Today's visit was #: 15 Starting weight: 185 lbs Starting date: 12/13/2019 Weight change since last visit: 0 Total lbs lost to date: 45 lbs Total weight loss percentage to date: -24.32%  Nutrition Plan: Practicing portion control and making smarter food choices, such as increasing vegetables and decreasing simple carbohydrates for 100% of the time. Activity: Walking for 30 minutes 3-4 times per week. Anti-obesity medications: Wegovy 2.4 mg subcutaneously weekly. Reported side effects: None.  Interim History: Brooke [name] says that she would like to lose 10 more pounds.  She reports that her hunger levels are controlled.  Labs reviewed.  Assessment/Plan:   1. Other decreased white blood cell (WBC) count Will recheck CBC today.  - CBC with Differential/Platelet  2. Polyphagia Controlled. Current treatment: Wegovy 2.4 mg subcutaneously weekly. She will continue to focus on protein-rich, low simple carbohydrate foods. We reviewed the importance of hydration, regular exercise for stress reduction, and restorative sleep.  - Refill Semaglutide-Weight Management (WEGOVY) 2.4 MG/0.75ML SOAJ; Inject 2.4 mg into the skin once a week.  Dispense: 9 mL; Refill: 2  3. Vitamin D deficiency Not at goal. She is taking vitamin D 50,000 IU weekly.  Plan: Continue to take prescription Vitamin D '@50'$ ,000 IU every week as prescribed.  Will check vitamin D level today.  Lab Results  Component Value Date   VD25OH 32.3 07/08/2021   VD25OH 24.3 (L) 08/21/2020   VD25OH 28.5 (L) 04/08/2020   - Refill Cholecalciferol 1.25 MG (50000 UT) capsule; Take 1 capsule (50,000 Units total) by mouth once a week for 12 weeks.  Dispense: 12 capsule; Refill: 0  4. Other  insomnia Rick says she rotates between trazodone and Ambien depending on work.  5. Mixed hyperlipidemia Course: Not at goal. Lipid-lowering medications: None.   Plan: Dietary changes: Increase soluble fiber, decrease simple carbohydrates, decrease saturated fat. Exercise changes: Moderate to vigorous-intensity aerobic activity 150 minutes per week or as tolerated. We will continue to monitor along with PCP/specialists as it pertains to her weight loss journey.  Lab Results  Component Value Date   CHOL 204 (H) 07/08/2021   HDL 46 07/08/2021   LDLCALC 135 (H) 07/08/2021   LDLDIRECT 130.6 09/05/2009   TRIG 128 07/08/2021   CHOLHDL 4.4 07/08/2021   Lab Results  Component Value Date   ALT 24 08/06/2021   AST 22 08/06/2021   ALKPHOS 114 08/06/2021   BILITOT 0.2 08/06/2021   The 10-year ASCVD risk score (Arnett DK, et al., 2019) is: 1.1%   Values used to calculate the score:     Age: 6 years     Sex: Female     Is Non-Hispanic African American: No     Diabetic: No     Tobacco smoker: No     Systolic Blood Pressure: 97 mmHg     Is BP treated: No     HDL Cholesterol: 46 mg/dL     Total Cholesterol: 204 mg/dL  6. Abnormal blood electrolyte level Will check CMP today.  - Comprehensive metabolic panel  7. Obesity, current BMI 24  - Refill Semaglutide-Weight Management (WEGOVY) 2.4 MG/0.75ML SOAJ; Inject 2.4 mg into the skin once a week.  Dispense: 9 mL; Refill: 2  Course: Brooke [name] is currently in the action stage of change. As such, her  goal is to continue with weight loss efforts.   Nutrition goals: She has agreed to practicing portion control and making smarter food choices, such as increasing vegetables and decreasing simple carbohydrates.   Exercise goals:  As is.  Behavioral modification strategies: increasing lean protein intake, decreasing simple carbohydrates, increasing vegetables, and increasing water intake.  [name] has agreed to follow-up with our clinic  in 6 weeks. She was informed of the importance of frequent follow-up visits to maximize her success with intensive lifestyle modifications for her multiple health conditions.   Objective:   Blood pressure 97/65, pulse 87, temperature 97.8 F (36.6 C), temperature source Oral, height '5\' 4"'$  (1.626 m), weight 140 lb (63.5 kg), last menstrual period 04/03/2018, SpO2 97 %. Body mass index is 24.03 kg/m.  General: Cooperative, alert, well developed, in no acute distress. HEENT: Conjunctivae and lids unremarkable. Cardiovascular: Regular rhythm.  Lungs: Normal work of breathing. Neurologic: No focal deficits.   Lab Results  Component Value Date   CREATININE 0.90 08/06/2021   BUN 14 08/06/2021   NA 144 08/06/2021   K 4.4 08/06/2021   CL 110 (H) 08/06/2021   CO2 19 (L) 08/06/2021   Lab Results  Component Value Date   ALT 24 08/06/2021   AST 22 08/06/2021   ALKPHOS 114 08/06/2021   BILITOT 0.2 08/06/2021   Lab Results  Component Value Date   HGBA1C 5.5 12/13/2019   HGBA1C 5.5 03/05/2013   Lab Results  Component Value Date   INSULIN 4.3 07/08/2021   INSULIN 13.6 08/21/2020   INSULIN 12.0 12/13/2019   Lab Results  Component Value Date   TSH 3.310 07/08/2021   Lab Results  Component Value Date   CHOL 204 (H) 07/08/2021   HDL 46 07/08/2021   LDLCALC 135 (H) 07/08/2021   LDLDIRECT 130.6 09/05/2009   TRIG 128 07/08/2021   CHOLHDL 4.4 07/08/2021   Lab Results  Component Value Date   VD25OH 32.3 07/08/2021   VD25OH 24.3 (L) 08/21/2020   VD25OH 28.5 (L) 04/08/2020   Lab Results  Component Value Date   WBC 3.2 (L) 08/06/2021   HGB 14.7 08/06/2021   HCT 44.5 08/06/2021   MCV 93 08/06/2021   PLT 210 08/06/2021   Lab Results  Component Value Date   IRON 129 07/08/2021   TIBC 234 (L) 07/08/2021   FERRITIN 201 (H) 07/08/2021   Attestation Statements:   Reviewed by clinician on day of visit: allergies, medications, problem list, medical history, surgical history,  family history, social history, and previous encounter notes.  I, Water quality scientist, CMA, am acting as transcriptionist for Briscoe Deutscher, DO  I have reviewed the above documentation for accuracy and completeness, and I agree with the above. -  Briscoe Deutscher, DO, MS, FAAFP, DABOM - Family and Bariatric Medicine.

## 2021-08-14 ENCOUNTER — Other Ambulatory Visit (HOSPITAL_BASED_OUTPATIENT_CLINIC_OR_DEPARTMENT_OTHER): Payer: Self-pay

## 2021-08-17 ENCOUNTER — Other Ambulatory Visit (HOSPITAL_BASED_OUTPATIENT_CLINIC_OR_DEPARTMENT_OTHER): Payer: Self-pay

## 2021-10-02 ENCOUNTER — Other Ambulatory Visit (HOSPITAL_BASED_OUTPATIENT_CLINIC_OR_DEPARTMENT_OTHER): Payer: Self-pay

## 2021-10-08 ENCOUNTER — Other Ambulatory Visit (HOSPITAL_BASED_OUTPATIENT_CLINIC_OR_DEPARTMENT_OTHER): Payer: Self-pay

## 2021-10-08 ENCOUNTER — Ambulatory Visit (INDEPENDENT_AMBULATORY_CARE_PROVIDER_SITE_OTHER): Payer: No Typology Code available for payment source | Admitting: Psychiatry

## 2021-10-08 ENCOUNTER — Encounter: Payer: Self-pay | Admitting: Psychiatry

## 2021-10-08 DIAGNOSIS — F3342 Major depressive disorder, recurrent, in full remission: Secondary | ICD-10-CM | POA: Diagnosis not present

## 2021-10-08 DIAGNOSIS — F411 Generalized anxiety disorder: Secondary | ICD-10-CM | POA: Diagnosis not present

## 2021-10-08 DIAGNOSIS — F5105 Insomnia due to other mental disorder: Secondary | ICD-10-CM | POA: Diagnosis not present

## 2021-10-08 DIAGNOSIS — F4001 Agoraphobia with panic disorder: Secondary | ICD-10-CM | POA: Diagnosis not present

## 2021-10-08 DIAGNOSIS — E559 Vitamin D deficiency, unspecified: Secondary | ICD-10-CM

## 2021-10-08 DIAGNOSIS — G4726 Circadian rhythm sleep disorder, shift work type: Secondary | ICD-10-CM

## 2021-10-08 MED ORDER — TOPIRAMATE 50 MG PO TABS
ORAL_TABLET | ORAL | 1 refills | Status: DC
  Filled 2021-10-08: qty 360, fill #0
  Filled 2021-12-08: qty 360, 90d supply, fill #0
  Filled 2022-03-29: qty 360, 90d supply, fill #1

## 2021-10-08 MED ORDER — PAROXETINE HCL 40 MG PO TABS
ORAL_TABLET | ORAL | 1 refills | Status: DC
  Filled 2021-10-08: qty 180, 90d supply, fill #0
  Filled 2022-01-11: qty 180, 90d supply, fill #1

## 2021-10-08 MED ORDER — ARIPIPRAZOLE 10 MG PO TABS
ORAL_TABLET | Freq: Every day | ORAL | 1 refills | Status: DC
  Filled 2021-10-08: qty 90, fill #0
  Filled 2021-11-04: qty 90, 90d supply, fill #0
  Filled 2021-12-08: qty 90, 90d supply, fill #1
  Filled 2022-01-26: qty 60, 60d supply, fill #1
  Filled 2022-01-28: qty 30, 30d supply, fill #1

## 2021-10-08 MED ORDER — TRAZODONE HCL 50 MG PO TABS
ORAL_TABLET | ORAL | 1 refills | Status: DC
  Filled 2021-10-08: qty 180, fill #0
  Filled 2021-12-31: qty 180, 90d supply, fill #0
  Filled 2022-03-29 – 2022-03-30 (×2): qty 180, 90d supply, fill #1

## 2021-10-08 MED ORDER — ZOLPIDEM TARTRATE 10 MG PO TABS
ORAL_TABLET | Freq: Every evening | ORAL | 1 refills | Status: DC | PRN
Start: 2021-10-08 — End: 2022-04-27
  Filled 2021-10-08: qty 90, 90d supply, fill #0
  Filled 2022-01-26: qty 90, 90d supply, fill #1

## 2021-10-08 NOTE — Progress Notes (Signed)
Brooke [name] [id number] 02-28-1971 51 y.o.  Subjective:   Patient ID:  Brooke [name] is a 51 y.o. (DOB 1970/07/16) female.  Chief Complaint:  Chief Complaint  Patient presents with   Follow-up   Anxiety   Depression    HPI Brooke [name] presents to the office today for follow-up of anxiety and insomnia  seen December 2019.  For anxiety and weight gain topiramate was increased off label to 200 mg daily.  For anxiety propranolol 20 to 40 mg twice daily was added as needed.    seen November 2020: No meds were changed.  The following was noted: OK except for life events.  Still a lot of worry. Managed normally.  H agrees also.  Son's wife pregnant with another man's baby.  Real busy with 60 hours weekly.  Can't take Ambien at times bc of that.  Realizes she needs to take better self care.   Marland Kitchen August 23, 2019 appointment, the following is noted: Still doing well with meds.  Cont intermittent problems with work affecting sleep patterns and which sleep meds she can take.  Satisfied with meds.  No panic.  depression and anxiety manageable not gone.  Panic worse if overworked DT short staffed.  Patient reports stable mood and denies depressed or irritable moods.  Patient reports occasional difficulty with anxiety.  Patient reports occasionally difficulty with sleep initiation or maintenance. Denies appetite disturbance.  Patient reports that energy and motivation have been good.  Patient denies any difficulty with concentration.  Patient denies any suicidal ideation.  04/10/20 appt with following noted: Lost 33# at [facility] on 1000 calorie diet and Wegovy. A little extra anxiety lately without any specific reason for 2-3 mos.  No panic.  Mostly worry  Sx without avoidance.  Able to enjoy things normally.  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   Patient reports stable mood and denies  depressed or irritable moods.  Patient denies difficulty with sleep initiation or maintenance. Denies appetite disturbance.  Patient reports that energy and motivation have been good.  Patient denies any difficulty with concentration.  Patient denies any suicidal ideation. Plan: Continue alprazolam XR 0.5 mg every morning and 1 mg nightly increase aripiprazole 10 mg daily to see anxiety is better Continue paroxetine 80 mg daily Continue propranolol 20 mg tablets 1-2 twice daily as needed anxiety Continue topiramate 50 mg tablets 2 twice daily Continue trazodone 100 mg nightly Continue zolpidem 5 to 10 mg nightly as needed insomnia She typically will alternate trazodone or Ambien depending on her work schedule.  10/09/20 appt noted: Good.  Lost 50# pounds and feels a whole lot better. Using [facility] Potential shoulder surgery. No panic.  Anxiety has been controlled.  Not depressed.   Increase in Abilify helped that a lot. No SE. Still shift work.  Low vitamin D.   No concerns about meds and no reason for changes. Doing well. Plan: No med changes  04/09/2021 appointment with the following noted: Good normal stressors worse this time of year. Propranolol used prn is helpful for anxiety physically and tolerated. No SE with meds. Still She typically will alternate trazodone or Ambien depending on her work schedule. Sleep ok with meds mostly. Working dayshift OR, Youth worker.  Long hours. Lost down to 137 from 190#.  Makes her feel better about herself.  On Wegovy.  Dr. Juleen China. No desire for med  changes Will get labs done. Plan: Continue alprazolam XR 0.5 mg every morning and 1 mg nightly Continue aripiprazole 10 mg daily  the increase helped anxiety is better Continue paroxetine 80 mg daily Continue propranolol 20 mg tablets 1-2 twice daily as needed anxiety helpful prn Continue topiramate 50 mg tablets 2 twice daily Continue trazodone 100 mg nightly Continue zolpidem 5 to 10 mg nightly  as needed insomnia  10/08/21 appt noted: Good overall .  Marland KitchenNormal anxiety with stressors.  No panic. Patient reports stable mood and denies depressed or irritable moods.   Patient denies difficulty with sleep initiation or maintenance. Denies appetite disturbance.  Patient reports that energy and motivation have been good.  Patient denies any difficulty with concentration.  Patient denies any suicidal ideation. Dayshift generally. Propranolol helps No SE.  No excessive sleepiness with Xanax  and sometimes only 2 daily.  Past Psychiatric Medication Trials: Citalopram, fluoxetine, fluvoxamine, and Anafranil, paroxetine 80, duloxetine Abilify 10 Buspirone Topiramate Ambien, Ambien CR, trazodone Xanax XR  Review of Systems:  Review of Systems  Musculoskeletal:  Negative for arthralgias.       Pending shoulder surgery  Neurological:  Negative for dizziness, tremors and weakness.  Psychiatric/Behavioral:  Negative for agitation, behavioral problems, confusion, decreased concentration, dysphoric mood, hallucinations, self-injury, sleep disturbance and suicidal ideas. The patient is nervous/anxious. The patient is not hyperactive.     Medications: I have reviewed the patient's current medications.  Current Outpatient Medications  Medication Sig Dispense Refill   acetaminophen (TYLENOL) 500 MG tablet Take 1,000 mg by mouth every 6 (six) hours as needed for moderate pain.     AMBULATORY NON FORMULARY MEDICATION Medication Name: Diltiazem 2% compound with Lidocaine 5% 30 g 0   Calcium Carb-Cholecalciferol (CALCIUM 600+D) 600-800 MG-UNIT TABS Take 2 tablets by mouth daily.     Cholecalciferol 1.25 MG (50000 UT) capsule Take 1 capsule (50,000 Units total) by mouth once a week for 12 weeks. 12 capsule 0   ibuprofen (ADVIL) 200 MG tablet Take 800 mg by mouth every 6 (six) hours as needed for moderate pain or headache.     Melatonin 5 MG CHEW Chew 10 mg by mouth daily.     methocarbamol (ROBAXIN) 500  MG tablet Take 1 tablet by mouth 4 times daily 30 tablet 0   ondansetron (ZOFRAN) 4 MG tablet Take 1 tablet (4 mg total) by mouth daily as needed for nausea or vomiting. 30 tablet 1   polyethylene glycol (MIRALAX / GLYCOLAX) 17 g packet Take 17 g by mouth daily.     propranolol (INDERAL) 20 MG tablet Take 1-2 tablets (20-40 mg total) by mouth daily as needed (anxiety). 60 tablet 2   Semaglutide-Weight Management (WEGOVY) 2.4 MG/0.75ML SOAJ Inject 2.4 mg into the skin once a week. 9 mL 2   ARIPiprazole (ABILIFY) 10 MG tablet TAKE 1 TABLET (10 MG TOTAL) BY MOUTH DAILY. 90 tablet 1   PARoxetine (PAXIL) 40 MG tablet TAKE 2 TABLETS (80 MG TOTAL) BY MOUTH AT BEDTIME. 180 tablet 1   topiramate (TOPAMAX) 50 MG tablet TAKE 2 TABLETS (100 MG TOTAL) BY MOUTH 2 (TWO) TIMES DAILY. 360 tablet 1   traZODone (DESYREL) 50 MG tablet TAKE 2 TABLETS (100 MG TOTAL) BY MOUTH AT BEDTIME. 180 tablet 1   zolpidem (AMBIEN) 10 MG tablet TAKE 1 TABLET (10 MG TOTAL) BY MOUTH AT BEDTIME AS NEEDED FOR SLEEP. 90 tablet 1   No current facility-administered medications for this visit.    Medication Side Effects:  None  Allergies: No Known Allergies  Past Medical History:  Diagnosis Date   Anxiety    Arthritis    right shoulder   Bursitis    Constipation    Depression    Fatty liver    seen on [place] 04-2018   HLD (hyperlipidemia)    Hyperglycemia 03/05/2013   Hypotension    Incontinence    Lower extremity edema    Lumbar stress fracture    Obesity    Osteoporosis    SOB (shortness of breath)     Family History  Problem Relation Age of Onset   Colon polyps Mother    Thyroid disease Mother    Hyperlipidemia Mother    Diabetes Mother    Hypertension Mother    Diabetes Father    Hypertension Father    Hyperlipidemia Father    Mental illness Sister    Diabetes Paternal Aunt    Hypertension Paternal Uncle    Diabetes Paternal Grandmother    Hypertension Paternal Grandmother    Hyperlipidemia Paternal  Grandmother    Colon cancer Neg Hx    Esophageal cancer Neg Hx    Rectal cancer Neg Hx    Stomach cancer Neg Hx     Social History   Socioeconomic History   Marital status: Married    Spouse name: John   Number of children: Not on file   Years of education: 16   Highest education level: Not on file  Occupational History   Occupation: RN/short stay    Employer: [employer]  Tobacco Use   Smoking status: Never   Smokeless tobacco: Never  Vaping Use   Vaping Use: Never used  Substance and Sexual Activity   Alcohol use: No    Alcohol/week: 0.0 standard drinks of alcohol   Drug use: No   Sexual activity: Yes    Partners: Male    Birth control/protection: Surgical    Comment: Husband's vasectomy  Other Topics Concern   Not on file  Social History Narrative   Not on file   Social Determinants of Health   Financial Resource Strain: Not on file  Food Insecurity: Not on file  Transportation Needs: Not on file  Physical Activity: Not on file  Stress: Not on file  Social Connections: Not on file  Intimate Partner Violence: Not on file    Past Medical History, Surgical history, Social history, and Family history were reviewed and updated as appropriate.   Please see review of systems for further details on the patient's review from today.   Objective:   Physical Exam:  LMP 04/03/2018   Physical Exam Constitutional:      General: She is not in acute distress.    Appearance: Normal appearance.  Musculoskeletal:        General: No deformity.  Neurological:     Mental Status: She is alert and oriented to person, place, and time.     Coordination: Coordination normal.     Gait: Gait normal.  Psychiatric:        Attention and Perception: She is attentive.        Mood and Affect: Mood is anxious. Mood is not depressed. Affect is not labile, blunt, angry or inappropriate.        Speech: Speech normal.        Behavior: Behavior normal.        Thought Content: Thought  content normal. Thought content does not include homicidal or suicidal ideation. Thought content does not  include homicidal or suicidal plan.        Cognition and Memory: Cognition normal.        Judgment: Judgment normal.     Comments: Insight intact. No auditory or visual hallucinations. No delusions.      Lab Review:     Component Value Date/Time   NA 144 08/06/2021 1022   K 4.4 08/06/2021 1022   CL 110 (H) 08/06/2021 1022   CO2 19 (L) 08/06/2021 1022   GLUCOSE 80 08/06/2021 1022   GLUCOSE 84 11/19/2020 0907   BUN 14 08/06/2021 1022   CREATININE 0.90 08/06/2021 1022   CREATININE 0.80 11/24/2018 1503   CALCIUM 9.3 08/06/2021 1022   PROT 6.1 08/06/2021 1022   ALBUMIN 4.3 08/06/2021 1022   AST 22 08/06/2021 1022   ALT 24 08/06/2021 1022   ALKPHOS 114 08/06/2021 1022   BILITOT 0.2 08/06/2021 1022   GFRNONAA >60 11/19/2020 0907   GFRAA 89 04/08/2020 0851       Component Value Date/Time   WBC 3.2 (L) 08/06/2021 1022   WBC 5.5 11/19/2020 1305   RBC 4.81 08/06/2021 1022   RBC 4.11 11/19/2020 1305   HGB 14.7 08/06/2021 1022   HCT 44.5 08/06/2021 1022   PLT 210 08/06/2021 1022   MCV 93 08/06/2021 1022   MCH 30.6 08/06/2021 1022   MCH 31.4 11/19/2020 1305   MCHC 33.0 08/06/2021 1022   MCHC 33.3 11/19/2020 1305   RDW 12.7 08/06/2021 1022   LYMPHSABS 1.2 08/06/2021 1022   MONOABS 0.4 04/10/2019 0930   EOSABS 0.0 08/06/2021 1022   BASOSABS 0.0 08/06/2021 1022    No results found for: "POCLITH", "LITHIUM"   No results found for: "PHENYTOIN", "PHENOBARB", "VALPROATE", "CBMZ"   .res Assessment: Plan:    Generalized anxiety disorder - Plan: PARoxetine (PAXIL) 40 MG tablet, topiramate (TOPAMAX) 50 MG tablet  Panic disorder with agoraphobia - Plan: PARoxetine (PAXIL) 40 MG tablet  Major depression, recurrent, full remission ([place]) - Plan: ARIPiprazole (ABILIFY) 10 MG tablet, PARoxetine (PAXIL) 40 MG tablet  Insomnia due to mental condition - Plan: traZODone (DESYREL) 50  MG tablet, zolpidem (AMBIEN) 10 MG tablet  Shift work sleep disorder  Vitamin D deficiency   Overall med combo has worked well for depression and anxiety but is better than last time...  Disc options for benefit more: increase Abilify, topiramate off label, prn propranolol. Disc SE in detail of each option.   Prefer to avoid increasing Xanax which can make HER sleepy in the daytime.    Discussed potential metabolic side effects associated with atypical antipsychotics, as well as potential risk for movement side effects. Advised pt to contact office if movement side effects occur.  No AIM  We discussed the short-term risks associated with benzodiazepines including sedation and increased fall risk among others.  Discussed long-term side effect risk including dependence, potential withdrawal symptoms, and the potential eventual dose-related risk of dementia.  Newer studies published 2020 dispute risk of dementia with benzodiazepines.  And disc risk of Ambien amnesia.  Consider switch to Ativan from Xanax to reduce the sedation from Bz.    Better self care and more sleep would help. Protect SLEEP.  Disc borderline low vitamin D levels and psych literature and immune studies suggesting goal of mid range vit d levels.  cONTINUE topiramate bc help weight gain risk to '200mg'$  daily.  Disc SE.  Rx prn propranolol 20-40 mg BID for anxiety.   Disc polypharmacy is medcially necessary and has been helpful.  It has taken a lot of meds and combination of mechanisms in order to manage her depression and anxiety symptoms.  She tolerates the higher dose of paroxetine without side effects.  Panic was not managed with less paroxetine. Disc purpose of each med.  Disc SE in detail and SSRI withdrawal sx. Discussed serotonin syndrome.  Avoid dextromethorphan, tramadol which can increase risk. Continue alprazolam XR 0.5 mg every morning and 1 mg nightly Continue aripiprazole 10 mg daily  the increase helped anxiety  is better Continue paroxetine 80 mg daily Continue propranolol 20 mg tablets 1-2 twice daily as needed anxiety Continue topiramate 50 mg tablets 2 twice daily Continue trazodone 100 mg nightly Continue zolpidem 5 to 10 mg nightly as needed insomnia She typically will alternate trazodone or Ambien depending on her work schedule. No med changes today  This appt was 30 mins.  FU 6 mos  Lynder Parents, MD, DFAPA     Please see After Visit Summary for patient specific instructions.  No future appointments.   No orders of the defined types were placed in this encounter.     -------------------------------

## 2021-10-21 ENCOUNTER — Other Ambulatory Visit (HOSPITAL_BASED_OUTPATIENT_CLINIC_OR_DEPARTMENT_OTHER): Payer: Self-pay

## 2021-10-21 MED ORDER — WEGOVY 2.4 MG/0.75ML ~~LOC~~ SOAJ
SUBCUTANEOUS | 3 refills | Status: DC
  Filled 2021-11-04 – 2021-12-14 (×3): qty 3, 28d supply, fill #0
  Filled 2022-01-11: qty 3, 28d supply, fill #1

## 2021-10-23 ENCOUNTER — Other Ambulatory Visit (HOSPITAL_BASED_OUTPATIENT_CLINIC_OR_DEPARTMENT_OTHER): Payer: Self-pay

## 2021-11-04 ENCOUNTER — Encounter: Payer: Self-pay | Admitting: Family Medicine

## 2021-11-05 ENCOUNTER — Other Ambulatory Visit (HOSPITAL_BASED_OUTPATIENT_CLINIC_OR_DEPARTMENT_OTHER): Payer: Self-pay

## 2021-11-16 ENCOUNTER — Other Ambulatory Visit (HOSPITAL_BASED_OUTPATIENT_CLINIC_OR_DEPARTMENT_OTHER): Payer: Self-pay

## 2021-12-08 ENCOUNTER — Other Ambulatory Visit (HOSPITAL_BASED_OUTPATIENT_CLINIC_OR_DEPARTMENT_OTHER): Payer: Self-pay

## 2021-12-09 ENCOUNTER — Encounter (INDEPENDENT_AMBULATORY_CARE_PROVIDER_SITE_OTHER): Payer: Self-pay

## 2021-12-14 ENCOUNTER — Other Ambulatory Visit: Payer: Self-pay | Admitting: Psychiatry

## 2021-12-14 ENCOUNTER — Other Ambulatory Visit (HOSPITAL_BASED_OUTPATIENT_CLINIC_OR_DEPARTMENT_OTHER): Payer: Self-pay

## 2021-12-14 DIAGNOSIS — F411 Generalized anxiety disorder: Secondary | ICD-10-CM

## 2021-12-14 DIAGNOSIS — F4001 Agoraphobia with panic disorder: Secondary | ICD-10-CM

## 2021-12-14 MED ORDER — ALPRAZOLAM ER 0.5 MG PO TB24
ORAL_TABLET | ORAL | 0 refills | Status: DC
  Filled 2021-12-14: qty 270, 90d supply, fill #0

## 2021-12-15 ENCOUNTER — Other Ambulatory Visit (HOSPITAL_BASED_OUTPATIENT_CLINIC_OR_DEPARTMENT_OTHER): Payer: Self-pay

## 2021-12-17 ENCOUNTER — Ambulatory Visit (HOSPITAL_COMMUNITY)
Admission: RE | Admit: 2021-12-17 | Discharge: 2021-12-17 | Disposition: A | Discharge status: Home / Self Care | Payer: No Typology Code available for payment source | Source: Ambulatory Visit | Attending: Emergency Medicine | Admitting: Emergency Medicine

## 2021-12-17 ENCOUNTER — Encounter (HOSPITAL_COMMUNITY): Payer: Self-pay

## 2021-12-17 ENCOUNTER — Ambulatory Visit (INDEPENDENT_AMBULATORY_CARE_PROVIDER_SITE_OTHER): Payer: No Typology Code available for payment source

## 2021-12-17 VITALS — BP 114/79 | HR 84 | Temp 97.9°F | Resp 18

## 2021-12-17 DIAGNOSIS — R079 Chest pain, unspecified: Secondary | ICD-10-CM | POA: Diagnosis not present

## 2021-12-17 DIAGNOSIS — R109 Unspecified abdominal pain: Secondary | ICD-10-CM

## 2021-12-17 LAB — POCT URINALYSIS DIPSTICK, ED / UC
Bilirubin Urine: NEGATIVE
Glucose, UA: NEGATIVE mg/dL
Hgb urine dipstick: NEGATIVE
Ketones, ur: NEGATIVE mg/dL
Nitrite: NEGATIVE
Protein, ur: NEGATIVE mg/dL
Specific Gravity, Urine: 1.02 (ref 1.005–1.030)
Urobilinogen, UA: 0.2 mg/dL (ref 0.0–1.0)
pH: 6.5 (ref 5.0–8.0)

## 2021-12-17 MED ORDER — IBUPROFEN 800 MG PO TABS
ORAL_TABLET | ORAL | Status: AC
  Filled 2021-12-17: qty 1

## 2021-12-17 MED ORDER — IBUPROFEN 800 MG PO TABS
800.0000 mg | ORAL_TABLET | Freq: Once | ORAL | Status: AC
  Administered 2021-12-17: 800 mg via ORAL

## 2021-12-17 NOTE — ED Triage Notes (Signed)
Patient complains of right sided pain X 3 days. She has taken IBU to help with the pain but no relief.

## 2021-12-17 NOTE — ED Provider Notes (Signed)
[place]    CSN: [id number] Arrival date & time: 12/17/21  1434     History   Chief Complaint Chief Complaint  Patient presents with   Abdominal Pain    Right upper abdominal pain - Entered by patient    HPI Brooke [name] is a 51 y.o. female.  Since with 3-day history of abdominal pain. Reports right upper abdomen 4/10 pain.  Does not notice an association between food/eating and her pain.  Pain is not worse with movement.  It does not radiate anywhere. She has tried ibuprofen 800 mg for her symptoms.  Last medicine was very early this morning. Denies any fever, chills, chest pain, shortness of breath, pain with deep breath, nausea, vomiting/diarrhea, rash, urinary symptoms, vaginal discharge.  She is postmenopausal.  Denies vaginal bleeding.  History of fatty liver, osteoporosis, vitamin D deficiency  Past Medical History:  Diagnosis Date   Anxiety    Arthritis    right shoulder   Bursitis    Constipation    Depression    Fatty liver    seen on [place] 04-2018   HLD (hyperlipidemia)    Hyperglycemia 03/05/2013   Hypotension    Incontinence    Lower extremity edema    Lumbar stress fracture    Obesity    Osteoporosis    SOB (shortness of breath)     Patient Active Problem List   Diagnosis Date Noted   Vitamin D deficiency 06/04/2020   Mixed hyperlipidemia 06/04/2020   Polyphagia, controlled with [facility] 06/04/2020   Chronic low blood pressure 06/04/2020   Greater trochanteric bursitis of right hip 04/11/2019   GAD (generalized anxiety disorder) 04/17/2018   Panic disorder with agoraphobia 04/17/2018   Recurrent UTI 02/06/2014   Osteoporosis, unspecified 01/10/2014   Urinary frequency 09/04/2012   Sleep disturbance 12/13/2011   FEMALE STRESS INCONTINENCE 09/05/2009   DEPRESSION 05/31/2008    Past Surgical History:  Procedure Laterality Date   COLONOSCOPY     EXCISION/RELEASE BURSA HIP Right 04/11/2019   Procedure: Right hip bursectomy;   Surgeon: [name], MD;  Location: [facility];  Service: Orthopedics;  Laterality: Right;  67mn   RHINOPLASTY     SINUS ENDO W/FUSION     TONSILLECTOMY      OB History     Gravida  3   Para  3   Term      Preterm      AB      Living         SAB      IAB      Ectopic      Multiple      Live Births               Home Medications    Prior to Admission medications   Medication Sig Start Date End Date Taking? Authorizing Provider  acetaminophen (TYLENOL) 500 MG tablet Take 1,000 mg by mouth every 6 (six) hours as needed for moderate pain.   Yes [provider]  ALPRAZolam (ALPRAZOLAM XR) 0.5 MG 24 hr tablet TAKE ONE TABLET BY MOUTH IN THE MORNING IF NEEDED FOR ANXIETY AND TWO TABLETS AT NIGHT FOR ANXIETY AND SLEEP 12/14/21 06/12/22 Yes Cottle, CBilley Co, MD  AMBULATORY NON FORMULARY MEDICATION Medication Name: Diltiazem 2% compound with Lidocaine 5% 10/22/20  Yes BThornton Park MD  ARIPiprazole (ABILIFY) 10 MG tablet TAKE 1 TABLET (10 MG TOTAL) BY MOUTH DAILY. 10/08/21 10/08/22 Yes Cottle, CBilley Co, MD  Calcium Carb-Cholecalciferol (CALCIUM 600+D) 600-800 MG-UNIT TABS Take 2 tablets by mouth daily.   Yes [provider]  Cholecalciferol 1.25 MG (50000 UT) capsule Take 1 capsule (50,000 Units total) by mouth once a week for 12 weeks. 08/06/21  Yes Briscoe Deutscher, DO  ibuprofen (ADVIL) 200 MG tablet Take 800 mg by mouth every 6 (six) hours as needed for moderate pain or headache.   Yes [provider]  Melatonin 5 MG CHEW Chew 10 mg by mouth daily.   Yes [provider]  PARoxetine (PAXIL) 40 MG tablet TAKE 2 TABLETS (80 MG TOTAL) BY MOUTH AT BEDTIME. 10/08/21 10/08/22 Yes Cottle, Billey Co., MD  polyethylene glycol (MIRALAX / GLYCOLAX) 17 g packet Take 17 g by mouth daily.   Yes [provider]  propranolol (INDERAL) 20 MG tablet Take 1-2 tablets (20-40 mg total) by mouth daily as needed (anxiety). 04/09/21  Yes Cottle, Billey Co., MD  Semaglutide-Weight Management [facility]) 2.4 MG/0.75ML SOAJ Inject 2.4 mg into the skin once a week. 08/06/21 12/18/21 Yes Briscoe Deutscher, DO  topiramate (TOPAMAX) 50 MG tablet TAKE 2 TABLETS (100 MG TOTAL) BY MOUTH 2 (TWO) TIMES DAILY. 10/08/21 10/08/22 Yes Cottle, Billey Co., MD  traZODone (DESYREL) 50 MG tablet TAKE 2 TABLETS (100 MG TOTAL) BY MOUTH AT BEDTIME. 10/08/21 10/08/22 Yes Cottle, Billey Co., MD  zolpidem (AMBIEN) 10 MG tablet TAKE 1 TABLET (10 MG TOTAL) BY MOUTH AT BEDTIME AS NEEDED FOR SLEEP. 10/08/21 04/06/22 Yes Cottle, Billey Co., MD  methocarbamol (ROBAXIN) 500 MG tablet Take 1 tablet by mouth 4 times daily 01/02/21     Semaglutide-Weight Management (WEGOVY) 2.4 MG/0.75ML SOAJ Inject 0.75 mL under the skin once a week 10/21/21       Family History Family History  Problem Relation Age of Onset   Colon polyps Mother    Thyroid disease Mother    Hyperlipidemia Mother    Diabetes Mother    Hypertension Mother    Diabetes Father    Hypertension Father    Hyperlipidemia Father    Mental illness Sister    Diabetes Paternal Aunt    Hypertension Paternal Uncle    Diabetes Paternal Grandmother    Hypertension Paternal Grandmother    Hyperlipidemia Paternal Grandmother    Colon cancer Neg Hx    Esophageal cancer Neg Hx    Rectal cancer Neg Hx    Stomach cancer Neg Hx     Social History Social History   Tobacco Use   Smoking status: Never   Smokeless tobacco: Never  Vaping Use   Vaping Use: Never used  Substance Use Topics   Alcohol use: No    Alcohol/week: 0.0 standard drinks of alcohol   Drug use: No     Allergies   Patient has no known allergies.   Review of Systems Review of Systems  Gastrointestinal:  Positive for abdominal pain.   Per HPI  Physical Exam Triage Vital Signs ED Triage Vitals  Enc Vitals Group     BP 12/17/21 1455 114/79     Pulse Rate 12/17/21 1455 84     Resp 12/17/21 1455 18     Temp 12/17/21 1455 97.9 F (36.6 C)     Temp  Source 12/17/21 1455 Oral     SpO2 12/17/21 1455 96 %     Weight --      Height --      Head Circumference --      Peak Flow --  Pain Score 12/17/21 1452 4     Pain Loc --      Pain Edu? --      Excl. in [place]? --    No data found.  Updated Vital Signs BP 114/79 (BP Location: Right Arm)   Pulse 84   Temp 97.9 F (36.6 C) (Oral)   Resp 18   LMP 04/03/2018   SpO2 96%   Physical Exam Vitals and nursing note reviewed.  Constitutional:      Appearance: Normal appearance.  HENT:     Mouth/Throat:     Mouth: Mucous membranes are moist.     Pharynx: Oropharynx is clear.  Eyes:     Conjunctiva/sclera: Conjunctivae normal.  Cardiovascular:     Rate and Rhythm: Normal rate and regular rhythm.     Heart sounds: Normal heart sounds.  Pulmonary:     Effort: Pulmonary effort is normal. No respiratory distress.     Breath sounds: Normal breath sounds.  Abdominal:     General: Bowel sounds are normal.     Palpations: Abdomen is soft.     Tenderness: There is abdominal tenderness in the right upper quadrant. There is no right CVA tenderness, left CVA tenderness, guarding or rebound. Negative signs include Murphy's sign.     Comments: Point tenderness in the right upper quadrant just below the 12th rib, laterally.  Some tenderness along the rib as well  Musculoskeletal:        General: Normal range of motion.  Skin:    General: Skin is warm and dry.     Findings: No rash.  Neurological:     Mental Status: She is alert and oriented to person, place, and time.     UC Treatments / Results  Labs (all labs ordered are listed, but only abnormal results are displayed) Labs Reviewed  POCT URINALYSIS DIPSTICK, ED / UC - Abnormal; Notable for the following components:      Result Value   Leukocytes,Ua TRACE (*)    All other components within normal limits    EKG  Radiology DG Ribs Unilateral Right  Result Date: 12/17/2021 CLINICAL DATA:  Right chest pain EXAM: RIGHT RIBS - 2  VIEW COMPARISON:  Chest radiographs done on 02/16/2018 FINDINGS: No displaced fractures are seen in right ribs. Right lung is clear. There is no definite demonstrable pleural effusion or pneumothorax. IMPRESSION: No fracture is seen in right ribs. Right lung is clear of any focal infiltrates. Electronically Signed   By: Elmer Picker M.D.   On: 12/17/2021 15:52    Procedures Procedures   Medications Ordered in UC Medications  ibuprofen (ADVIL) tablet 800 mg (800 mg Oral Given 12/17/21 1523)    Initial Impression / Assessment and Plan / UC Course  I have reviewed the triage vital signs and the nursing notes.  Pertinent labs & imaging results that were available during my care of the patient were reviewed by me and considered in my medical decision making (see chart for details).  Urinalysis with trace leuks. Patient has one area of tenderness on the right lateral abdomen just under the 12th rib.  As rib was somewhat tender, obtained x-ray which was negative for abnormality. Ibuprofen dose given for discomfort, without change in 4/10 pain. May need ultrasound for further evaluation. Recommend follow up with PCP or GI specialist if symptoms persist. Provided [place] [facility] info. She does have primary care appointment scheduled for tomorrow afternoon. Strict ED precautions for worsening symptoms.   Final Clinical  Impressions(s) / UC Diagnoses   Final diagnoses:  Right sided abdominal pain     Discharge Instructions      Your x-ray was negative.  I recommend continuing ibuprofen every 6 hours as needed for pain.  You can also try ice or hot pad to the area.  If symptoms persist, you can follow-up with the gastroenterologist or your primary care provider.  Please go to the emergency department if symptoms worsen.     ED Prescriptions   None    PDMP not reviewed this encounter.   Nobie Alleyne, [place], [place] 12/17/21 1626

## 2021-12-17 NOTE — Discharge Instructions (Addendum)
Your x-ray was negative.  I recommend continuing ibuprofen every 6 hours as needed for pain.  You can also try ice or hot pad to the area.  If symptoms persist, you can follow-up with the gastroenterologist or your primary care provider.  Please go to the emergency department if symptoms worsen.

## 2021-12-18 ENCOUNTER — Ambulatory Visit (HOSPITAL_BASED_OUTPATIENT_CLINIC_OR_DEPARTMENT_OTHER)
Admission: RE | Admit: 2021-12-18 | Discharge: 2021-12-18 | Disposition: A | Discharge status: Home / Self Care | Payer: No Typology Code available for payment source | Source: Ambulatory Visit | Attending: Family Medicine | Admitting: Family Medicine

## 2021-12-18 ENCOUNTER — Ambulatory Visit (INDEPENDENT_AMBULATORY_CARE_PROVIDER_SITE_OTHER): Payer: No Typology Code available for payment source | Admitting: Family Medicine

## 2021-12-18 ENCOUNTER — Encounter: Payer: Self-pay | Admitting: Family Medicine

## 2021-12-18 VITALS — BP 130/78 | HR 98 | Temp 98.4°F | Resp 18 | Ht 64.0 in | Wt 147.8 lb

## 2021-12-18 DIAGNOSIS — R1011 Right upper quadrant pain: Secondary | ICD-10-CM

## 2021-12-18 NOTE — Patient Instructions (Signed)

## 2021-12-18 NOTE — Assessment & Plan Note (Signed)
Check labs  Check [place] If pain worsens to go ER

## 2021-12-18 NOTE — Progress Notes (Signed)
Established Patient Office Visit  Subjective   Patient ID: Brooke [name], female    DOB: 1971/02/01  Age: 51 y.o. MRN: [id number]  Chief Complaint  Patient presents with   Abdominal Pain    X4 days, Pt went to UC yesterday. They told her she may need a ultrasoung of gallbladder and liver.    HPI Pt is here c/o ruq pain x 4 days.  [facility] and xray done--- both neg.  She was told she needed an [place].   No NVD   no constipation Patient Active Problem List   Diagnosis Date Noted   RUQ abdominal pain 12/18/2021   Vitamin D deficiency 06/04/2020   Mixed hyperlipidemia 06/04/2020   Polyphagia, controlled with [facility] 06/04/2020   Chronic low blood pressure 06/04/2020   Greater trochanteric bursitis of right hip 04/11/2019   GAD (generalized anxiety disorder) 04/17/2018   Panic disorder with agoraphobia 04/17/2018   Recurrent UTI 02/06/2014   Osteoporosis, unspecified 01/10/2014   Urinary frequency 09/04/2012   Sleep disturbance 12/13/2011   FEMALE STRESS INCONTINENCE 09/05/2009   DEPRESSION 05/31/2008   Past Medical History:  Diagnosis Date   Anxiety    Arthritis    right shoulder   Bursitis    Constipation    Depression    Fatty liver    seen on [place] 04-2018   HLD (hyperlipidemia)    Hyperglycemia 03/05/2013   Hypotension    Incontinence    Lower extremity edema    Lumbar stress fracture    Obesity    Osteoporosis    SOB (shortness of breath)    Past Surgical History:  Procedure Laterality Date   COLONOSCOPY     EXCISION/RELEASE BURSA HIP Right 04/11/2019   Procedure: Right hip bursectomy;  Surgeon: [name], MD;  Location: [facility];  Service: Orthopedics;  Laterality: Right;  69mn   RHINOPLASTY     SINUS ENDO W/FUSION     TONSILLECTOMY     Social History   Tobacco Use   Smoking status: Never   Smokeless tobacco: Never  Vaping Use   Vaping Use: Never used  Substance Use Topics   Alcohol use: No    Alcohol/week: 0.0 standard  drinks of alcohol   Drug use: No   Social History   Socioeconomic History   Marital status: Married    Spouse name: John   Number of children: Not on file   Years of education: 16   Highest education level: Not on file  Occupational History   Occupation: RN/short stay    Employer: [employer]  Tobacco Use   Smoking status: Never   Smokeless tobacco: Never  Vaping Use   Vaping Use: Never used  Substance and Sexual Activity   Alcohol use: No    Alcohol/week: 0.0 standard drinks of alcohol   Drug use: No   Sexual activity: Yes    Partners: Male    Birth control/protection: Surgical    Comment: Husband's vasectomy  Other Topics Concern   Not on file  Social History Narrative   Not on file   Social Determinants of Health   Financial Resource Strain: Not on file  Food Insecurity: Not on file  Transportation Needs: Not on file  Physical Activity: Not on file  Stress: Not on file  Social Connections: Not on file  Intimate Partner Violence: Not on file   Family Status  Relation Name Status   Mother  Alive   Father  Alive   Sister  Alive   Sister  Deceased   Brother  Alive   Brother  Alive   Field seismologist  (Not Specified)   Psychiatrist  (Not Specified)   PGM  Alive   PGF  Deceased   Neg Hx  (Not Specified)   Family History  Problem Relation Age of Onset   Colon polyps Mother    Thyroid disease Mother    Hyperlipidemia Mother    Diabetes Mother    Hypertension Mother    Diabetes Father    Hypertension Father    Hyperlipidemia Father    Mental illness Sister    Diabetes Paternal Aunt    Hypertension Paternal Uncle    Diabetes Paternal Grandmother    Hypertension Paternal Grandmother    Hyperlipidemia Paternal Grandmother    Colon cancer Neg Hx    Esophageal cancer Neg Hx    Rectal cancer Neg Hx    Stomach cancer Neg Hx    No Known Allergies    ROS    Objective:     BP 130/78 (BP Location: Left Arm, Patient Position: Sitting, Cuff Size: Normal)    Pulse 98   Temp 98.4 F (36.9 C) (Oral)   Resp 18   Ht '5\' 4"'  (1.626 m)   Wt 147 lb 12.8 oz (67 kg)   LMP 04/03/2018   SpO2 100%   BMI 25.37 kg/m  BP Readings from Last 3 Encounters:  12/18/21 130/78  12/17/21 114/79  08/06/21 97/65   Wt Readings from Last 3 Encounters:  12/18/21 147 lb 12.8 oz (67 kg)  08/06/21 140 lb (63.5 kg)  03/31/21 140 lb (63.5 kg)   SpO2 Readings from Last 3 Encounters:  12/18/21 100%  12/17/21 96%  08/06/21 97%      Physical Exam   No results found for any visits on 12/18/21.  Last CBC Lab Results  Component Value Date   WBC 3.2 (L) 08/06/2021   HGB 14.7 08/06/2021   HCT 44.5 08/06/2021   MCV 93 08/06/2021   MCH 30.6 08/06/2021   RDW 12.7 08/06/2021   PLT 210 15/40/0867   Last metabolic panel Lab Results  Component Value Date   GLUCOSE 80 08/06/2021   NA 144 08/06/2021   K 4.4 08/06/2021   CL 110 (H) 08/06/2021   CO2 19 (L) 08/06/2021   BUN 14 08/06/2021   CREATININE 0.90 08/06/2021   EGFR 77 08/06/2021   CALCIUM 9.3 08/06/2021   PHOS 2.6 01/10/2014   PROT 6.1 08/06/2021   ALBUMIN 4.3 08/06/2021   LABGLOB 1.8 08/06/2021   AGRATIO 2.4 (H) 08/06/2021   BILITOT 0.2 08/06/2021   ALKPHOS 114 08/06/2021   AST 22 08/06/2021   ALT 24 08/06/2021   ANIONGAP 7 11/19/2020   Last lipids Lab Results  Component Value Date   CHOL 204 (H) 07/08/2021   HDL 46 07/08/2021   LDLCALC 135 (H) 07/08/2021   LDLDIRECT 130.6 09/05/2009   TRIG 128 07/08/2021   CHOLHDL 4.4 07/08/2021   Last hemoglobin A1c Lab Results  Component Value Date   HGBA1C 5.5 12/13/2019   Last thyroid functions Lab Results  Component Value Date   TSH 3.310 07/08/2021   Last vitamin D Lab Results  Component Value Date   VD25OH 32.3 07/08/2021   Last vitamin B12 and Folate Lab Results  Component Value Date   VITAMINB12 600 07/08/2021      The 10-year ASCVD risk score (Arnett DK, et al., 2019) is: 2.1%  Assessment & Plan:   Problem List Items  Addressed This Visit       Unprioritized   RUQ abdominal pain - Primary    Check labs  Check [place] If pain worsens to go ER       Relevant Orders   US Abdomen Complete   CBC with Differential/Platelet   Comprehensive metabolic panel    Return if symptoms worsen or fail to improve.    Ann Held, DO

## 2021-12-19 LAB — CBC WITH DIFFERENTIAL/PLATELET
Absolute Monocytes: 372 cells/uL (ref 200–950)
Basophils Absolute: 10 cells/uL (ref 0–200)
Basophils Relative: 0.2 %
Eosinophils Absolute: 88 cells/uL (ref 15–500)
Eosinophils Relative: 1.8 %
HCT: 39.6 % (ref 35.0–45.0)
Hemoglobin: 13.8 g/dL (ref 11.7–15.5)
Lymphs Abs: 1891 cells/uL (ref 850–3900)
MCH: 31.9 pg (ref 27.0–33.0)
MCHC: 34.8 g/dL (ref 32.0–36.0)
MCV: 91.7 fL (ref 80.0–100.0)
MPV: 10.4 fL (ref 7.5–12.5)
Monocytes Relative: 7.6 %
Neutro Abs: 2538 cells/uL (ref 1500–7800)
Neutrophils Relative %: 51.8 %
Platelets: 217 10*3/uL (ref 140–400)
RBC: 4.32 10*6/uL (ref 3.80–5.10)
RDW: 12.2 % (ref 11.0–15.0)
Total Lymphocyte: 38.6 %
WBC: 4.9 10*3/uL (ref 3.8–10.8)

## 2021-12-19 LAB — COMPREHENSIVE METABOLIC PANEL
AG Ratio: 2.3 (calc) (ref 1.0–2.5)
ALT: 17 U/L (ref 6–29)
AST: 18 U/L (ref 10–35)
Albumin: 4.3 g/dL (ref 3.6–5.1)
Alkaline phosphatase (APISO): 107 U/L (ref 37–153)
BUN/Creatinine Ratio: 13 (calc) (ref 6–22)
BUN: 14 mg/dL (ref 7–25)
CO2: 23 mmol/L (ref 20–32)
Calcium: 9 mg/dL (ref 8.6–10.4)
Chloride: 108 mmol/L (ref 98–110)
Creat: 1.1 mg/dL — ABNORMAL HIGH (ref 0.50–1.03)
Globulin: 1.9 g/dL (calc) (ref 1.9–3.7)
Glucose, Bld: 80 mg/dL (ref 65–99)
Potassium: 3.7 mmol/L (ref 3.5–5.3)
Sodium: 141 mmol/L (ref 135–146)
Total Bilirubin: 0.4 mg/dL (ref 0.2–1.2)
Total Protein: 6.2 g/dL (ref 6.1–8.1)

## 2021-12-22 ENCOUNTER — Other Ambulatory Visit: Payer: Self-pay | Admitting: Family Medicine

## 2021-12-22 ENCOUNTER — Encounter: Payer: Self-pay | Admitting: Family Medicine

## 2021-12-22 DIAGNOSIS — R1011 Right upper quadrant pain: Secondary | ICD-10-CM

## 2021-12-22 NOTE — Addendum Note (Signed)
Addended byDamita Dunnings D on: 12/22/2021 11:27 AM   Modules accepted: Orders

## 2021-12-23 ENCOUNTER — Other Ambulatory Visit (HOSPITAL_BASED_OUTPATIENT_CLINIC_OR_DEPARTMENT_OTHER): Payer: Self-pay

## 2021-12-23 MED ORDER — MOUNJARO 5 MG/0.5ML ~~LOC~~ SOAJ
SUBCUTANEOUS | 0 refills | Status: DC
  Filled 2021-12-23: qty 2, 28d supply, fill #0
  Filled 2021-12-23: qty 2, 30d supply, fill #0
  Filled 2021-12-31: qty 2, 28d supply, fill #0
  Filled 2021-12-31: qty 2, 7d supply, fill #0
  Filled 2022-01-11: qty 2, 28d supply, fill #0

## 2021-12-25 ENCOUNTER — Encounter (HOSPITAL_COMMUNITY): Payer: No Typology Code available for payment source

## 2021-12-25 ENCOUNTER — Encounter (HOSPITAL_COMMUNITY): Payer: Self-pay

## 2021-12-31 ENCOUNTER — Ambulatory Visit (INDEPENDENT_AMBULATORY_CARE_PROVIDER_SITE_OTHER): Payer: No Typology Code available for payment source | Admitting: Family Medicine

## 2021-12-31 ENCOUNTER — Other Ambulatory Visit (HOSPITAL_BASED_OUTPATIENT_CLINIC_OR_DEPARTMENT_OTHER): Payer: Self-pay

## 2021-12-31 ENCOUNTER — Other Ambulatory Visit (HOSPITAL_COMMUNITY)
Admission: RE | Admit: 2021-12-31 | Discharge: 2021-12-31 | Disposition: A | Discharge status: Home / Self Care | Payer: No Typology Code available for payment source | Source: Ambulatory Visit | Attending: Family Medicine | Admitting: Family Medicine

## 2021-12-31 ENCOUNTER — Encounter: Payer: Self-pay | Admitting: Family Medicine

## 2021-12-31 ENCOUNTER — Ambulatory Visit (HOSPITAL_COMMUNITY)
Admission: RE | Admit: 2021-12-31 | Discharge: 2021-12-31 | Disposition: A | Discharge status: Home / Self Care | Payer: No Typology Code available for payment source | Source: Ambulatory Visit | Attending: Family Medicine | Admitting: Family Medicine

## 2021-12-31 VITALS — BP 98/60 | HR 91 | Temp 98.8°F | Ht 63.0 in | Wt 152.2 lb

## 2021-12-31 DIAGNOSIS — R1011 Right upper quadrant pain: Secondary | ICD-10-CM | POA: Insufficient documentation

## 2021-12-31 DIAGNOSIS — Z0000 Encounter for general adult medical examination without abnormal findings: Secondary | ICD-10-CM

## 2021-12-31 DIAGNOSIS — Z6831 Body mass index (BMI) 31.0-31.9, adult: Secondary | ICD-10-CM | POA: Diagnosis not present

## 2021-12-31 DIAGNOSIS — E669 Obesity, unspecified: Secondary | ICD-10-CM

## 2021-12-31 DIAGNOSIS — E2839 Other primary ovarian failure: Secondary | ICD-10-CM

## 2021-12-31 DIAGNOSIS — Z1159 Encounter for screening for other viral diseases: Secondary | ICD-10-CM

## 2021-12-31 DIAGNOSIS — E782 Mixed hyperlipidemia: Secondary | ICD-10-CM

## 2021-12-31 LAB — COMPREHENSIVE METABOLIC PANEL
ALT: 23 U/L (ref 0–35)
AST: 20 U/L (ref 0–37)
Albumin: 4.1 g/dL (ref 3.5–5.2)
Alkaline Phosphatase: 111 U/L (ref 39–117)
BUN: 17 mg/dL (ref 6–23)
CO2: 29 mEq/L (ref 19–32)
Calcium: 9.2 mg/dL (ref 8.4–10.5)
Chloride: 108 mEq/L (ref 96–112)
Creatinine, Ser: 0.93 mg/dL (ref 0.40–1.20)
GFR: 70.3 mL/min (ref >60.00)
Glucose, Bld: 55 mg/dL — ABNORMAL LOW (ref 70–99)
Potassium: 3.9 mEq/L (ref 3.5–5.1)
Sodium: 142 mEq/L (ref 135–145)
Total Bilirubin: 0.3 mg/dL (ref 0.2–1.2)
Total Protein: 6.2 g/dL (ref 6.0–8.3)

## 2021-12-31 LAB — LIPID PANEL
Cholesterol: 220 mg/dL — ABNORMAL HIGH (ref 0–200)
HDL: 48.8 mg/dL (ref >39.00)
LDL Cholesterol: 133 mg/dL — ABNORMAL HIGH (ref 0–99)
NonHDL: 171.26
Total CHOL/HDL Ratio: 5
Triglycerides: 193 mg/dL — ABNORMAL HIGH (ref 0.0–149.0)
VLDL: 38.6 mg/dL (ref 0.0–40.0)

## 2021-12-31 LAB — CBC WITH DIFFERENTIAL/PLATELET
Basophils Absolute: 0 10*3/uL (ref 0.0–0.1)
Basophils Relative: 0.1 % (ref 0.0–3.0)
Eosinophils Absolute: 0.1 10*3/uL (ref 0.0–0.7)
Eosinophils Relative: 2.1 % (ref 0.0–5.0)
HCT: 43.5 % (ref 36.0–46.0)
Hemoglobin: 14.3 g/dL (ref 12.0–15.0)
Lymphocytes Relative: 30.9 % (ref 12.0–46.0)
Lymphs Abs: 1.5 10*3/uL (ref 0.7–4.0)
MCHC: 33 g/dL (ref 30.0–36.0)
MCV: 92.9 fl (ref 78.0–100.0)
Monocytes Absolute: 0.3 10*3/uL (ref 0.1–1.0)
Monocytes Relative: 6.7 % (ref 3.0–12.0)
Neutro Abs: 2.9 10*3/uL (ref 1.4–7.7)
Neutrophils Relative %: 60.2 % (ref 43.0–77.0)
Platelets: 238 10*3/uL (ref 150.0–400.0)
RBC: 4.68 Mil/uL (ref 3.87–5.11)
RDW: 13.1 % (ref 11.5–15.5)
WBC: 4.8 10*3/uL (ref 4.0–10.5)

## 2021-12-31 LAB — TSH: TSH: 2.08 u[IU]/mL (ref 0.35–5.50)

## 2021-12-31 LAB — HEMOGLOBIN A1C: Hgb A1c MFr Bld: 5.4 % (ref 4.6–6.5)

## 2021-12-31 MED ORDER — TECHNETIUM TC 99M MEBROFENIN IV KIT
5.3000 | PACK | Freq: Once | INTRAVENOUS | Status: AC
  Administered 2021-12-31: 5.3 via INTRAVENOUS

## 2021-12-31 NOTE — Patient Instructions (Signed)
Preventive Care 53-53 Years Old, Female Preventive care refers to lifestyle choices and visits with your health care provider that can promote health and wellness. Preventive care visits are also called wellness exams. What can I expect for my preventive care visit? Counseling Your health care provider may ask you questions about your: Medical history, including: Past medical problems. Family medical history. Pregnancy history. Current health, including: Menstrual cycle. Method of birth control. Emotional well-being. Home life and relationship well-being. Sexual activity and sexual health. Lifestyle, including: Alcohol, nicotine or tobacco, and drug use. Access to firearms. Diet, exercise, and sleep habits. Work and work environment. Sunscreen use. Safety issues such as seatbelt and bike helmet use. Physical exam Your health care provider will check your: Height and weight. These may be used to calculate your BMI (body mass index). BMI is a measurement that tells if you are at a healthy weight. Waist circumference. This measures the distance around your waistline. This measurement also tells if you are at a healthy weight and may help predict your risk of certain diseases, such as type 2 diabetes and high blood pressure. Heart rate and blood pressure. Body temperature. Skin for abnormal spots. What immunizations do I need?  Vaccines are usually given at various ages, according to a schedule. Your health care provider will recommend vaccines for you based on your age, medical history, and lifestyle or other factors, such as travel or where you work. What tests do I need? Screening Your health care provider may recommend screening tests for certain conditions. This may include: Lipid and cholesterol levels. Diabetes screening. This is done by checking your blood sugar (glucose) after you have not eaten for a while (fasting). Pelvic exam and Pap test. Hepatitis B test. Hepatitis C  test. HIV (human immunodeficiency virus) test. STI (sexually transmitted infection) testing, if you are at risk. Lung cancer screening. Colorectal cancer screening. Mammogram. Talk with your health care provider about when you should start having regular mammograms. This may depend on whether you have a family history of breast cancer. BRCA-related cancer screening. This may be done if you have a family history of breast, ovarian, tubal, or peritoneal cancers. Bone density scan. This is done to screen for osteoporosis. Talk with your health care provider about your test results, treatment options, and if necessary, the need for more tests. Follow these instructions at home: Eating and drinking  Eat a diet that includes fresh fruits and vegetables, whole grains, lean protein, and low-fat dairy products. Take vitamin and mineral supplements as recommended by your health care provider. Do not drink alcohol if: Your health care provider tells you not to drink. You are pregnant, may be pregnant, or are planning to become pregnant. If you drink alcohol: Limit how much you have to 0-1 drink a day. Know how much alcohol is in your drink. In the [place] one drink equals one 12 oz bottle of beer (355 mL), one 5 oz glass of wine (148 mL), or one 1 oz glass of hard liquor (44 mL). Lifestyle Brush your teeth every morning and night with fluoride toothpaste. Floss one time each day. Exercise for at least 30 minutes 5 or more days each week. Do not use any products that contain nicotine or tobacco. These products include cigarettes, chewing tobacco, and vaping devices, such as e-cigarettes. If you need help quitting, ask your health care provider. Do not use drugs. If you are sexually active, practice safe sex. Use a condom or other form of protection to   prevent STIs. If you do not wish to become pregnant, use a form of birth control. If you plan to become pregnant, see your health care provider for a  prepregnancy visit. Take aspirin only as told by your health care provider. Make sure that you understand how much to take and what form to take. Work with your health care provider to find out whether it is safe and beneficial for you to take aspirin daily. Find healthy ways to manage stress, such as: Meditation, yoga, or listening to music. Journaling. Talking to a trusted person. Spending time with friends and family. Minimize exposure to UV radiation to reduce your risk of skin cancer. Safety Always wear your seat belt while driving or riding in a vehicle. Do not drive: If you have been drinking alcohol. Do not ride with someone who has been drinking. When you are tired or distracted. While texting. If you have been using any mind-altering substances or drugs. Wear a helmet and other protective equipment during sports activities. If you have firearms in your house, make sure you follow all gun safety procedures. Seek help if you have been physically or sexually abused. What's next? Visit your health care provider once a year for an annual wellness visit. Ask your health care provider how often you should have your eyes and teeth checked. Stay up to date on all vaccines. This information is not intended to replace advice given to you by your health care provider. Make sure you discuss any questions you have with your health care provider. Document Revised: 10/15/2020 Document Reviewed: 10/15/2020 Elsevier Patient Education  [place].

## 2021-12-31 NOTE — Progress Notes (Signed)
Subjective:     Brooke [name] is a 51 y.o. female and is here for a comprehensive physical exam. The patient reports no problems.  Social History   Socioeconomic History   Marital status: Married    Spouse name: John   Number of children: Not on file   Years of education: 16   Highest education level: Not on file  Occupational History   Occupation: RN/short stay    Employer: [employer]  Tobacco Use   Smoking status: Never   Smokeless tobacco: Never  Vaping Use   Vaping Use: Never used  Substance and Sexual Activity   Alcohol use: No    Alcohol/week: 0.0 standard drinks of alcohol   Drug use: No   Sexual activity: Yes    Partners: Male    Birth control/protection: Surgical    Comment: Husband's vasectomy  Other Topics Concern   Not on file  Social History Narrative   Not on file   Social Determinants of Health   Financial Resource Strain: Not on file  Food Insecurity: Not on file  Transportation Needs: Not on file  Physical Activity: Not on file  Stress: Not on file  Social Connections: Not on file  Intimate Partner Violence: Not on file   Health Maintenance  Topic Date Due   HIV Screening  Never done   Hepatitis C Screening  Never done   Zoster Vaccines- Shingrix (2 of 2) 01/19/2019   COVID-19 Vaccine (2 - Pfizer series) 04/03/2020   MAMMOGRAM  12/18/2020   PAP SMEAR-Modifier  11/23/2021   INFLUENZA VACCINE  12/01/2021   TETANUS/TDAP  03/06/2023   COLONOSCOPY (Pts 45-61yr Insurance coverage will need to be confirmed)  10/23/2027   HPV VACCINES  Aged Out    The following portions of the patient's history were reviewed and updated as appropriate: She  has a past medical history of Anxiety, Arthritis, Bursitis, Constipation, Depression, Fatty liver, HLD (hyperlipidemia), Hyperglycemia (03/05/2013), Hypotension, Incontinence, Lower extremity edema, Lumbar stress fracture, Obesity, Osteoporosis, and SOB (shortness of breath). She does not have any  pertinent problems on file. She  has a past surgical history that includes Rhinoplasty; Tonsillectomy; Sinus endo w/fusion; Excision/release bursa hip (Right, 04/11/2019); and Colonoscopy. Her family history includes Colon polyps in her mother; Diabetes in her father, mother, paternal aunt, and paternal grandmother; Hyperlipidemia in her father, mother, and paternal grandmother; Hypertension in her father, mother, paternal grandmother, and paternal uncle; Mental illness in her sister; Thyroid disease in her mother. She  reports that she has never smoked. She has never used smokeless tobacco. She reports that she does not drink alcohol and does not use drugs. She has a current medication list which includes the following prescription(s): acetaminophen, alprazolam, AMBULATORY NON FORMULARY MEDICATION, aripiprazole, calcium 600+d, cholecalciferol, ibuprofen, melatonin, paroxetine, polyethylene glycol, propranolol, wegovy, topiramate, trazodone, zolpidem, and mounjaro. Current Outpatient Medications on File Prior to Visit  Medication Sig Dispense Refill   acetaminophen (TYLENOL) 500 MG tablet Take 1,000 mg by mouth every 6 (six) hours as needed for moderate pain.     ALPRAZolam (ALPRAZOLAM XR) 0.5 MG 24 hr tablet TAKE ONE TABLET BY MOUTH IN THE MORNING IF NEEDED FOR ANXIETY AND TWO TABLETS AT NIGHT FOR ANXIETY AND SLEEP 270 tablet 0   AMBULATORY NON FORMULARY MEDICATION Medication Name: Diltiazem 2% compound with Lidocaine 5% 30 g 0   ARIPiprazole (ABILIFY) 10 MG tablet TAKE 1 TABLET (10 MG TOTAL) BY MOUTH DAILY. 90 tablet 1   Calcium Carb-Cholecalciferol (CALCIUM 600+D)  600-800 MG-UNIT TABS Take 2 tablets by mouth daily.     Cholecalciferol 1.25 MG (50000 UT) capsule Take 1 capsule (50,000 Units total) by mouth once a week for 12 weeks. 12 capsule 0   ibuprofen (ADVIL) 200 MG tablet Take 800 mg by mouth every 6 (six) hours as needed for moderate pain or headache.     Melatonin 5 MG CHEW Chew 10 mg by  mouth daily.     PARoxetine (PAXIL) 40 MG tablet TAKE 2 TABLETS (80 MG TOTAL) BY MOUTH AT BEDTIME. 180 tablet 1   polyethylene glycol (MIRALAX / GLYCOLAX) 17 g packet Take 17 g by mouth daily.     propranolol (INDERAL) 20 MG tablet Take 1-2 tablets (20-40 mg total) by mouth daily as needed (anxiety). 60 tablet 2   Semaglutide-Weight Management (WEGOVY) 2.4 MG/0.75ML SOAJ Inject 0.75 mL under the skin once a week 3 mL 3   topiramate (TOPAMAX) 50 MG tablet TAKE 2 TABLETS (100 MG TOTAL) BY MOUTH 2 (TWO) TIMES DAILY. 360 tablet 1   traZODone (DESYREL) 50 MG tablet TAKE 2 TABLETS (100 MG TOTAL) BY MOUTH AT BEDTIME. 180 tablet 1   zolpidem (AMBIEN) 10 MG tablet TAKE 1 TABLET (10 MG TOTAL) BY MOUTH AT BEDTIME AS NEEDED FOR SLEEP. 90 tablet 1   tirzepatide (MOUNJARO) 5 MG/0.5ML Pen Inject 5 mg under the skin once a week (Patient not taking: Reported on 12/31/2021) 2 mL 0   No current facility-administered medications on file prior to visit.   She has No Known Allergies..  Review of Systems Review of Systems  Constitutional: Negative for activity change, appetite change and fatigue.  HENT: Negative for hearing loss, congestion, tinnitus and ear discharge.  dentist q101mEyes: Negative for visual disturbance (see optho q1y -- vision corrected to 20/20 with glasses).  Respiratory: Negative for cough, chest tightness and shortness of breath.   Cardiovascular: Negative for chest pain, palpitations and leg swelling.  Gastrointestinal: Negative for abdominal pain, diarrhea, constipation and abdominal distention.  Genitourinary: Negative for urgency, frequency, decreased urine volume and difficulty urinating.  Musculoskeletal: Negative for back pain, arthralgias and gait problem.  Skin: Negative for color change, pallor and rash.  Neurological: Negative for dizziness, light-headedness, numbness and headaches.  Hematological: Negative for adenopathy. Does not bruise/bleed easily.  Psychiatric/Behavioral:  Negative for suicidal ideas, confusion, sleep disturbance, self-injury, dysphoric mood, decreased concentration and agitation.      Objective:    BP 98/60   Pulse 91   Temp 98.8 F (37.1 C) (Oral)   Ht '5\' 3"'$  (1.6 m)   Wt 152 lb 3.2 oz (69 kg)   LMP 04/03/2018   SpO2 99%   BMI 26.96 kg/m  General appearance: alert, cooperative, appears stated age, and no distress Head: Normocephalic, without obvious abnormality, atraumatic Eyes: conjunctivae/corneas clear. PERRL, EOM's intact. Fundi benign. Ears: normal TM's and external ear canals both ears Nose: Nares normal. Septum midline. Mucosa normal. No drainage or sinus tenderness. Throat: lips, mucosa, and tongue normal; teeth and gums normal Neck: no adenopathy, no carotid bruit, no JVD, supple, symmetrical, trachea midline, and thyroid not enlarged, symmetric, no tenderness/mass/nodules Back: symmetric, no curvature. ROM normal. No CVA tenderness. Lungs: clear to auscultation bilaterally Heart: regular rate and rhythm, S1, S2 normal, no murmur, click, rub or gallop Abdomen: soft, non-tender; bowel sounds normal; no masses,  no organomegaly Extremities: extremities normal, atraumatic, no cyanosis or edema Pulses: 2+ and symmetric Skin: Skin color, texture, turgor normal. No rashes or lesions Lymph nodes:  Cervical, supraclavicular, and axillary nodes normal. Neurologic: Alert and oriented X 3, normal strength and tone. Normal symmetric reflexes. Normal coordination and gait    Assessment:    Healthy female exam.      Plan:    Ghm utd Check labs  See After Visit Summary for Counseling Recommendations    1. Mixed hyperlipidemia Encourage heart healthy diet such as MIND or DASH diet, increase exercise, avoid trans fats, simple carbohydrates and processed foods, consider a krill or fish or flaxseed oil cap daily.   - Comprehensive metabolic panel - Lipid panel  2. Preventative health care Ghm utd Check labs  See avs - CBC  with Differential/Platelet - Comprehensive metabolic panel - Lipid panel - Microalbumin / creatinine urine ratio - TSH - Cytology - PAP( [place])  3. Class 1 obesity with serious comorbidity and body mass index (BMI) of 31.0 to 31.9 in adult, unspecified obesity type   - Hemoglobin A1c - Insulin, random  4. Need for hepatitis C screening test   - Hepatitis C antibody  5. Estrogen deficiency   - DG Bone Density; Future

## 2022-01-01 LAB — INSULIN, RANDOM: Insulin: 8.7 u[IU]/mL

## 2022-01-01 LAB — HEPATITIS C ANTIBODY: Hepatitis C Ab: NONREACTIVE

## 2022-01-01 LAB — MICROALBUMIN / CREATININE URINE RATIO
Creatinine,U: 83.4 mg/dL
Microalb Creat Ratio: 0.8 mg/g (ref 0.0–30.0)
Microalb, Ur: <0.7 mg/dL (ref 0.0–1.9)

## 2022-01-05 ENCOUNTER — Other Ambulatory Visit: Payer: Self-pay | Admitting: Family Medicine

## 2022-01-05 DIAGNOSIS — E782 Mixed hyperlipidemia: Secondary | ICD-10-CM

## 2022-01-07 LAB — CYTOLOGY - PAP
Comment: NEGATIVE
Diagnosis: NEGATIVE
High risk HPV: NEGATIVE

## 2022-01-11 ENCOUNTER — Other Ambulatory Visit (HOSPITAL_BASED_OUTPATIENT_CLINIC_OR_DEPARTMENT_OTHER): Payer: Self-pay

## 2022-01-12 ENCOUNTER — Encounter: Payer: Self-pay | Admitting: Family Medicine

## 2022-01-12 ENCOUNTER — Other Ambulatory Visit (HOSPITAL_BASED_OUTPATIENT_CLINIC_OR_DEPARTMENT_OTHER): Payer: Self-pay

## 2022-01-12 ENCOUNTER — Other Ambulatory Visit: Payer: Self-pay | Admitting: Family Medicine

## 2022-01-14 ENCOUNTER — Other Ambulatory Visit (HOSPITAL_BASED_OUTPATIENT_CLINIC_OR_DEPARTMENT_OTHER): Payer: Self-pay

## 2022-01-27 ENCOUNTER — Other Ambulatory Visit (HOSPITAL_BASED_OUTPATIENT_CLINIC_OR_DEPARTMENT_OTHER): Payer: Self-pay

## 2022-01-28 ENCOUNTER — Other Ambulatory Visit (HOSPITAL_BASED_OUTPATIENT_CLINIC_OR_DEPARTMENT_OTHER): Payer: Self-pay

## 2022-02-15 ENCOUNTER — Other Ambulatory Visit (HOSPITAL_BASED_OUTPATIENT_CLINIC_OR_DEPARTMENT_OTHER): Payer: Self-pay

## 2022-02-15 MED ORDER — WEGOVY 2.4 MG/0.75ML ~~LOC~~ SOAJ
SUBCUTANEOUS | 0 refills | Status: DC
  Filled 2022-02-15: qty 3, 28d supply, fill #0

## 2022-02-15 MED ORDER — QSYMIA 15-92 MG PO CP24
1.0000 | ORAL_CAPSULE | Freq: Every day | ORAL | 1 refills | Status: DC
  Filled 2022-02-15 – 2022-04-27 (×2): qty 30, 30d supply, fill #0

## 2022-02-16 ENCOUNTER — Other Ambulatory Visit (HOSPITAL_COMMUNITY): Payer: Self-pay

## 2022-02-16 ENCOUNTER — Other Ambulatory Visit: Payer: Self-pay | Admitting: Family Medicine

## 2022-02-16 ENCOUNTER — Other Ambulatory Visit (HOSPITAL_BASED_OUTPATIENT_CLINIC_OR_DEPARTMENT_OTHER): Payer: Self-pay

## 2022-02-16 DIAGNOSIS — Z1231 Encounter for screening mammogram for malignant neoplasm of breast: Secondary | ICD-10-CM

## 2022-02-18 ENCOUNTER — Ambulatory Visit
Admission: RE | Admit: 2022-02-18 | Discharge: 2022-02-18 | Disposition: A | Discharge status: Home / Self Care | Payer: No Typology Code available for payment source | Source: Ambulatory Visit | Attending: Family Medicine | Admitting: Family Medicine

## 2022-02-18 ENCOUNTER — Other Ambulatory Visit (HOSPITAL_BASED_OUTPATIENT_CLINIC_OR_DEPARTMENT_OTHER): Payer: Self-pay

## 2022-02-18 DIAGNOSIS — Z1231 Encounter for screening mammogram for malignant neoplasm of breast: Secondary | ICD-10-CM

## 2022-02-19 ENCOUNTER — Other Ambulatory Visit (HOSPITAL_BASED_OUTPATIENT_CLINIC_OR_DEPARTMENT_OTHER): Payer: Self-pay

## 2022-02-22 ENCOUNTER — Other Ambulatory Visit (HOSPITAL_BASED_OUTPATIENT_CLINIC_OR_DEPARTMENT_OTHER): Payer: Self-pay

## 2022-02-25 ENCOUNTER — Other Ambulatory Visit (HOSPITAL_BASED_OUTPATIENT_CLINIC_OR_DEPARTMENT_OTHER): Payer: Self-pay

## 2022-02-26 ENCOUNTER — Other Ambulatory Visit (HOSPITAL_BASED_OUTPATIENT_CLINIC_OR_DEPARTMENT_OTHER): Payer: Self-pay

## 2022-03-04 ENCOUNTER — Other Ambulatory Visit (HOSPITAL_BASED_OUTPATIENT_CLINIC_OR_DEPARTMENT_OTHER): Payer: Self-pay

## 2022-03-18 ENCOUNTER — Emergency Department (HOSPITAL_COMMUNITY): Payer: PRIVATE HEALTH INSURANCE

## 2022-03-18 ENCOUNTER — Encounter (HOSPITAL_COMMUNITY): Payer: Self-pay | Admitting: Emergency Medicine

## 2022-03-18 ENCOUNTER — Emergency Department (HOSPITAL_COMMUNITY)
Admission: EM | Admit: 2022-03-18 | Discharge: 2022-03-18 | Disposition: A | Discharge status: Home / Self Care | Payer: PRIVATE HEALTH INSURANCE | Attending: Emergency Medicine | Admitting: Emergency Medicine

## 2022-03-18 ENCOUNTER — Other Ambulatory Visit: Payer: Self-pay

## 2022-03-18 DIAGNOSIS — W228XXA Striking against or struck by other objects, initial encounter: Secondary | ICD-10-CM | POA: Diagnosis not present

## 2022-03-18 DIAGNOSIS — S99912A Unspecified injury of left ankle, initial encounter: Secondary | ICD-10-CM | POA: Insufficient documentation

## 2022-03-18 NOTE — ED Provider Notes (Signed)
[facility] DEPT Provider Note   CSN: [id number] Arrival date & time: 03/18/22  7341     History  Chief Complaint  Patient presents with   Ankle Pain    Brooke [name] is a 51 y.o. female.  Patient with history of osteoporosis presents today with complaints of ankle injury.  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  Endorses pain to same since.  She has been able to ambulate since with some discomfort.  Denies any other injuries or complaints.  The history is provided by the patient. No language interpreter was used.  Ankle Pain      Home Medications Prior to Admission medications   Medication Sig Start Date End Date Taking? Authorizing Provider  acetaminophen (TYLENOL) 500 MG tablet Take 1,000 mg by mouth every 6 (six) hours as needed for moderate pain.    [provider]  ALPRAZolam (ALPRAZOLAM XR) 0.5 MG 24 hr tablet TAKE ONE TABLET BY MOUTH IN THE MORNING IF NEEDED FOR ANXIETY AND TWO TABLETS AT NIGHT FOR ANXIETY AND SLEEP 12/14/21 06/12/22  Cottle, Billey Co., MD  AMBULATORY NON FORMULARY MEDICATION Medication Name: Diltiazem 2% compound with Lidocaine 5% 10/22/20   Thornton Park, MD  ARIPiprazole (ABILIFY) 10 MG tablet TAKE 1 TABLET (10 MG TOTAL) BY MOUTH DAILY. 10/08/21 10/08/22  Cottle, Billey Co., MD  Calcium Carb-Cholecalciferol (CALCIUM 600+D) 600-800 MG-UNIT TABS Take 2 tablets by mouth daily.    [provider]  Cholecalciferol 1.25 MG (50000 UT) capsule Take 1 capsule (50,000 Units total) by mouth once a week for 12 weeks. 08/06/21   Briscoe Deutscher, DO  ibuprofen (ADVIL) 200 MG tablet Take 800 mg by mouth every 6 (six) hours as needed for moderate pain or headache.    [provider]  Melatonin 5 MG CHEW Chew 10 mg by mouth daily.    [provider]  PARoxetine (PAXIL) 40 MG tablet TAKE 2 TABLETS (80 MG TOTAL) BY MOUTH AT BEDTIME.  10/08/21 10/08/22  Cottle, Billey Co., MD  Phentermine-Topiramate [facility]) 15-92 MG CP24 Take 1 capsule by mouth once a day 02/15/22     polyethylene glycol (MIRALAX / GLYCOLAX) 17 g packet Take 17 g by mouth daily.    [provider]  propranolol (INDERAL) 20 MG tablet Take 1-2 tablets (20-40 mg total) by mouth daily as needed (anxiety). 04/09/21   Cottle, Billey Co., MD  Semaglutide-Weight Management [facility]) 2.4 MG/0.75ML SOAJ Inject 0.75 ML under the skin once a week 02/15/22     topiramate (TOPAMAX) 50 MG tablet TAKE 2 TABLETS (100 MG TOTAL) BY MOUTH 2 (TWO) TIMES DAILY. 10/08/21 10/08/22  Purnell Shoemaker., MD  traZODone (DESYREL) 50 MG tablet TAKE 2 TABLETS (100 MG TOTAL) BY MOUTH AT BEDTIME. 10/08/21 10/08/22  Cottle, Billey Co., MD  zolpidem (AMBIEN) 10 MG tablet TAKE 1 TABLET (10 MG TOTAL) BY MOUTH AT BEDTIME AS NEEDED FOR SLEEP. 10/08/21 04/28/22  Cottle, Billey Co., MD      Allergies    Patient has no known allergies.    Review of Systems   Review of Systems  Musculoskeletal:  Positive for arthralgias.  All other systems reviewed and are negative.   Physical Exam Updated Vital Signs BP 121/81   Pulse 82   Temp 98.1 F (36.7 C) (Oral)   Resp 18   Ht '5\' 3"'$  (1.6  m)   Wt 69 kg   LMP 04/03/2018   SpO2 100%   BMI 26.95 kg/m  Physical Exam Vitals and nursing note reviewed.  Constitutional:      General: She is not in acute distress.    Appearance: Normal appearance. She is normal weight. She is not ill-appearing, toxic-appearing or diaphoretic.  HENT:     Head: Normocephalic and atraumatic.  Cardiovascular:     Rate and Rhythm: Normal rate.  Pulmonary:     Effort: Pulmonary effort is normal. No respiratory distress.  Musculoskeletal:        General: Normal range of motion.     Cervical back: Normal range of motion.     Comments: Trace swelling and mild tenderness noted to the lateral malleolus of the left ankle.  ROM intact with minimal discomfort.  DP and PT pulses  intact and 2+.  Capillary refill less than 2 seconds.  No obvious deformity.  No crepitus.  Compartments soft.  Skin:    General: Skin is warm and dry.  Neurological:     General: No focal deficit present.     Mental Status: She is alert.  Psychiatric:        Mood and Affect: Mood normal.        Behavior: Behavior normal.     ED Results / Procedures / Treatments   Labs (all labs ordered are listed, but only abnormal results are displayed) Labs Reviewed - No data to display  EKG None  Radiology DG Ankle Complete Left  Result Date: 03/18/2022 CLINICAL DATA:  Left ankle pain. EXAM: LEFT ANKLE COMPLETE - 3+ VIEW COMPARISON:  None Available. FINDINGS: There is no evidence of fracture, dislocation, or joint effusion. There is no evidence of arthropathy or other focal bone abnormality. Soft tissues are unremarkable. IMPRESSION: Negative. Electronically Signed   By: Anner Crete M.D.   On: 03/18/2022 02:57    Procedures Procedures    Medications Ordered in ED Medications - No data to display  ED Course/ Medical Decision Making/ A&P                           Medical Decision Making Amount and/or Complexity of Data Reviewed Radiology: ordered.   Patient presents today with left ankle pain after injury earlier today.  She is afebrile, nontoxic-appearing, and in no acute distress with reassuring vital signs.  Physical exam reveals trace swelling and mild tenderness without obvious deformity.  X-ray imaging negative for acute findings.  I have personally reviewed and interpreted this imaging and agree with radiology interpretation. No concern for septic arthritis or other emergent concerns. Given bracing for management of symptoms, recommend conservative management with RICE. Will give referral to orthopedics for follow-up as needed.  Patient is stable for discharge.  Educated on red flag symptoms of prompt immediate return.  Patient is understanding and amenable with plan, discharged  in stable condition.   Final Clinical Impression(s) / ED Diagnoses Final diagnoses:  Injury of left ankle, initial encounter    Rx / DC Orders ED Discharge Orders     None     An After Visit Summary was printed and given to the patient.     Nestor Lewandowsky 03/18/22 [place], April, MD 03/18/22 [phone]

## 2022-03-18 NOTE — ED Triage Notes (Signed)
Pt c/o left ankle pain since hitting her ankle on a piece of equipment in the OR last night.

## 2022-03-18 NOTE — Discharge Instructions (Signed)
As we discussed, x-ray imaging of her ankle was negative for acute findings.  I have given you a brace to wear as needed for support of your ankle.  I also recommend that you rest, ice, compress, and elevate your ankle for additional relief.  You may also take Tylenol/ibuprofen as needed for additional pain relief.  Additionally, I have given you a referral to orthopedics with a number to call to schedule an appointment for continued evaluation and management of your symptoms as needed.   Return if development of any new or worsening symptoms.

## 2022-03-29 ENCOUNTER — Other Ambulatory Visit (HOSPITAL_BASED_OUTPATIENT_CLINIC_OR_DEPARTMENT_OTHER): Payer: Self-pay

## 2022-03-29 ENCOUNTER — Other Ambulatory Visit: Payer: Self-pay | Admitting: Psychiatry

## 2022-03-29 DIAGNOSIS — F4001 Agoraphobia with panic disorder: Secondary | ICD-10-CM

## 2022-03-29 DIAGNOSIS — F411 Generalized anxiety disorder: Secondary | ICD-10-CM

## 2022-03-30 ENCOUNTER — Other Ambulatory Visit: Payer: Self-pay

## 2022-03-30 ENCOUNTER — Other Ambulatory Visit (HOSPITAL_BASED_OUTPATIENT_CLINIC_OR_DEPARTMENT_OTHER): Payer: Self-pay

## 2022-03-30 MED ORDER — ALPRAZOLAM ER 0.5 MG PO TB24
1.5000 mg | ORAL_TABLET | Freq: Every day | ORAL | 0 refills | Status: DC
  Filled 2022-03-30: qty 270, 90d supply, fill #0

## 2022-03-30 MED ORDER — WEGOVY 2.4 MG/0.75ML ~~LOC~~ SOAJ
2.4000 mg | SUBCUTANEOUS | 1 refills | Status: DC
  Filled 2022-03-30: qty 3, 28d supply, fill #0
  Filled 2022-04-27: qty 3, 28d supply, fill #1

## 2022-03-30 NOTE — Telephone Encounter (Signed)
Filled 8/15 appt 12/11

## 2022-03-31 ENCOUNTER — Other Ambulatory Visit (HOSPITAL_BASED_OUTPATIENT_CLINIC_OR_DEPARTMENT_OTHER): Payer: Self-pay

## 2022-04-01 ENCOUNTER — Other Ambulatory Visit (HOSPITAL_BASED_OUTPATIENT_CLINIC_OR_DEPARTMENT_OTHER): Payer: Self-pay

## 2022-04-09 ENCOUNTER — Other Ambulatory Visit (HOSPITAL_BASED_OUTPATIENT_CLINIC_OR_DEPARTMENT_OTHER): Payer: Self-pay

## 2022-04-12 ENCOUNTER — Ambulatory Visit: Payer: No Typology Code available for payment source | Admitting: Psychiatry

## 2022-04-12 ENCOUNTER — Other Ambulatory Visit: Payer: Self-pay | Admitting: Psychiatry

## 2022-04-12 ENCOUNTER — Other Ambulatory Visit (HOSPITAL_BASED_OUTPATIENT_CLINIC_OR_DEPARTMENT_OTHER): Payer: Self-pay

## 2022-04-12 DIAGNOSIS — F4001 Agoraphobia with panic disorder: Secondary | ICD-10-CM

## 2022-04-12 DIAGNOSIS — F411 Generalized anxiety disorder: Secondary | ICD-10-CM

## 2022-04-12 DIAGNOSIS — F3342 Major depressive disorder, recurrent, in full remission: Secondary | ICD-10-CM

## 2022-04-12 MED ORDER — PAROXETINE HCL 40 MG PO TABS
80.0000 mg | ORAL_TABLET | Freq: Every day | ORAL | 0 refills | Status: DC
  Filled 2022-04-12: qty 76, 38d supply, fill #0
  Filled 2022-04-12: qty 180, 90d supply, fill #0
  Filled 2022-04-12: qty 76, 38d supply, fill #0
  Filled 2022-04-12 (×2): qty 104, 52d supply, fill #0

## 2022-04-27 ENCOUNTER — Other Ambulatory Visit: Payer: Self-pay | Admitting: Psychiatry

## 2022-04-27 ENCOUNTER — Other Ambulatory Visit (HOSPITAL_BASED_OUTPATIENT_CLINIC_OR_DEPARTMENT_OTHER): Payer: Self-pay

## 2022-04-27 DIAGNOSIS — F5105 Insomnia due to other mental disorder: Secondary | ICD-10-CM

## 2022-04-28 ENCOUNTER — Other Ambulatory Visit (HOSPITAL_BASED_OUTPATIENT_CLINIC_OR_DEPARTMENT_OTHER): Payer: Self-pay

## 2022-04-28 ENCOUNTER — Other Ambulatory Visit: Payer: Self-pay | Admitting: Psychiatry

## 2022-04-28 DIAGNOSIS — F5105 Insomnia due to other mental disorder: Secondary | ICD-10-CM

## 2022-04-29 ENCOUNTER — Other Ambulatory Visit (HOSPITAL_BASED_OUTPATIENT_CLINIC_OR_DEPARTMENT_OTHER): Payer: Self-pay

## 2022-04-29 MED ORDER — ZOLPIDEM TARTRATE 10 MG PO TABS
10.0000 mg | ORAL_TABLET | Freq: Every evening | ORAL | 0 refills | Status: DC | PRN
  Filled 2022-04-29: qty 90, 90d supply, fill #0

## 2022-05-11 DIAGNOSIS — Z79899 Other long term (current) drug therapy: Secondary | ICD-10-CM | POA: Diagnosis not present

## 2022-05-11 DIAGNOSIS — Z8639 Personal history of other endocrine, nutritional and metabolic disease: Secondary | ICD-10-CM | POA: Diagnosis not present

## 2022-05-11 DIAGNOSIS — R632 Polyphagia: Secondary | ICD-10-CM | POA: Diagnosis not present

## 2022-05-11 DIAGNOSIS — Z6825 Body mass index (BMI) 25.0-25.9, adult: Secondary | ICD-10-CM | POA: Diagnosis not present

## 2022-05-11 DIAGNOSIS — E559 Vitamin D deficiency, unspecified: Secondary | ICD-10-CM | POA: Diagnosis not present

## 2022-05-11 DIAGNOSIS — E782 Mixed hyperlipidemia: Secondary | ICD-10-CM | POA: Diagnosis not present

## 2022-05-12 ENCOUNTER — Other Ambulatory Visit (HOSPITAL_BASED_OUTPATIENT_CLINIC_OR_DEPARTMENT_OTHER): Payer: Self-pay

## 2022-05-12 MED ORDER — ZEPBOUND 10 MG/0.5ML ~~LOC~~ SOAJ
10.0000 mg | SUBCUTANEOUS | 0 refills | Status: DC
  Filled 2022-05-12: qty 6, 84d supply, fill #0

## 2022-05-14 ENCOUNTER — Other Ambulatory Visit (HOSPITAL_BASED_OUTPATIENT_CLINIC_OR_DEPARTMENT_OTHER): Payer: Self-pay

## 2022-05-17 ENCOUNTER — Other Ambulatory Visit (HOSPITAL_BASED_OUTPATIENT_CLINIC_OR_DEPARTMENT_OTHER): Payer: Self-pay

## 2022-05-17 ENCOUNTER — Other Ambulatory Visit: Payer: Self-pay | Admitting: Psychiatry

## 2022-05-17 DIAGNOSIS — F3342 Major depressive disorder, recurrent, in full remission: Secondary | ICD-10-CM

## 2022-05-17 MED ORDER — ARIPIPRAZOLE 10 MG PO TABS
10.0000 mg | ORAL_TABLET | Freq: Every day | ORAL | 11 refills | Status: DC
  Filled 2022-05-17: qty 30, 30d supply, fill #0
  Filled 2022-06-10: qty 30, 30d supply, fill #1
  Filled 2022-07-19: qty 30, 30d supply, fill #2
  Filled 2022-08-17: qty 30, 30d supply, fill #3
  Filled 2022-09-23: qty 30, 30d supply, fill #4

## 2022-05-17 MED ORDER — COMIRNATY 30 MCG/0.3ML IM SUSY
PREFILLED_SYRINGE | INTRAMUSCULAR | 0 refills | Status: DC
  Filled 2022-05-17: qty 0.3, 1d supply, fill #0

## 2022-05-19 ENCOUNTER — Other Ambulatory Visit (HOSPITAL_BASED_OUTPATIENT_CLINIC_OR_DEPARTMENT_OTHER): Payer: Self-pay

## 2022-05-27 ENCOUNTER — Ambulatory Visit (HOSPITAL_COMMUNITY)
Admission: RE | Admit: 2022-05-27 | Discharge: 2022-05-27 | Disposition: A | Discharge status: Home / Self Care | Payer: 59 | Source: Ambulatory Visit | Attending: Family Medicine | Admitting: Family Medicine

## 2022-05-27 ENCOUNTER — Encounter (HOSPITAL_COMMUNITY): Payer: Self-pay

## 2022-05-27 ENCOUNTER — Ambulatory Visit: Payer: 59

## 2022-05-27 VITALS — BP 105/73 | HR 118 | Temp 99.8°F | Resp 17

## 2022-05-27 DIAGNOSIS — J069 Acute upper respiratory infection, unspecified: Secondary | ICD-10-CM | POA: Insufficient documentation

## 2022-05-27 DIAGNOSIS — R509 Fever, unspecified: Secondary | ICD-10-CM | POA: Insufficient documentation

## 2022-05-27 DIAGNOSIS — H6691 Otitis media, unspecified, right ear: Secondary | ICD-10-CM | POA: Diagnosis not present

## 2022-05-27 DIAGNOSIS — Z1152 Encounter for screening for COVID-19: Secondary | ICD-10-CM | POA: Insufficient documentation

## 2022-05-27 LAB — POC INFLUENZA A AND B ANTIGEN (URGENT CARE ONLY)
INFLUENZA A ANTIGEN, POC: NEGATIVE
INFLUENZA B ANTIGEN, POC: NEGATIVE

## 2022-05-27 MED ORDER — CEFDINIR 300 MG PO CAPS
600.0000 mg | ORAL_CAPSULE | Freq: Every day | ORAL | 0 refills | Status: DC
  Filled 2022-05-27: qty 10, 5d supply, fill #0

## 2022-05-27 MED ORDER — BENZONATATE 100 MG PO CAPS: 100.0000 mg | ORAL_CAPSULE | Freq: Three times a day (TID) | ORAL | 0 refills | Status: DC | PRN

## 2022-05-27 MED ORDER — BENZONATATE 100 MG PO CAPS
100.0000 mg | ORAL_CAPSULE | Freq: Three times a day (TID) | ORAL | 0 refills | Status: DC | PRN
  Filled 2022-05-27: qty 21, 7d supply, fill #0

## 2022-05-27 MED ORDER — CEFDINIR 300 MG PO CAPS: 600.0000 mg | ORAL_CAPSULE | Freq: Every day | ORAL | 0 refills | Status: AC
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
## 2022-05-27 NOTE — ED Provider Notes (Signed)
[place]    CSN: [id number] Arrival date & time: 05/27/22  1657      History   Chief Complaint Chief Complaint  Patient presents with   Chills    Fever 100.3. Sinus drainage  body aches - Entered by patient    HPI Brooke [name] is a 52 y.o. female.   HPI Here for fever to 100.3, sinus drainage and myalgia and chills.  She is also had some drainage and cough.  No vomiting or diarrhea but her appetite has been decreased.  Past Medical History:  Diagnosis Date   Anxiety    Arthritis    right shoulder   Bursitis    Constipation    Depression    Fatty liver    seen on [place] 04-2018   HLD (hyperlipidemia)    Hyperglycemia 03/05/2013   Hypotension    Incontinence    Lower extremity edema    Lumbar stress fracture    Obesity    Osteoporosis    SOB (shortness of breath)     Patient Active Problem List   Diagnosis Date Noted   RUQ abdominal pain 12/18/2021   Vitamin D deficiency 06/04/2020   Mixed hyperlipidemia 06/04/2020   Polyphagia, controlled with [facility] 06/04/2020   Chronic low blood pressure 06/04/2020   Greater trochanteric bursitis of right hip 04/11/2019   GAD (generalized anxiety disorder) 04/17/2018   Panic disorder with agoraphobia 04/17/2018   Recurrent UTI 02/06/2014   Osteoporosis, unspecified 01/10/2014   Urinary frequency 09/04/2012   Sleep disturbance 12/13/2011   FEMALE STRESS INCONTINENCE 09/05/2009   DEPRESSION 05/31/2008    Past Surgical History:  Procedure Laterality Date   BREAST BIOPSY Right 12/27/2018   NEG   COLONOSCOPY     EXCISION/RELEASE BURSA HIP Right 04/11/2019   Procedure: Right hip bursectomy;  Surgeon: [name], MD;  Location: [facility];  Service: Orthopedics;  Laterality: Right;  28mn   RHINOPLASTY     SINUS ENDO W/FUSION     TONSILLECTOMY      OB History     Gravida  3   Para  3   Term      Preterm      AB      Living         SAB      IAB      Ectopic      Multiple       Live Births               Home Medications    Prior to Admission medications   Medication Sig Start Date End Date Taking? Authorizing Provider  acetaminophen (TYLENOL) 500 MG tablet Take 1,000 mg by mouth every 6 (six) hours as needed for moderate pain.    [provider]  ALPRAZolam (ALPRAZOLAM XR) 0.5 MG 24 hr tablet Take 1 tablet (0.5 mg total) by mouth in the morning as needed for anxiety and take 2 tablets (1 mg total) by mouth at bedtime for anxiety and sleep. 03/30/22 09/26/22  Cottle, CBilley Co, MD  AMBULATORY NON FORMULARY MEDICATION Medication Name: Diltiazem 2% compound with Lidocaine 5% 10/22/20   BThornton Park MD  ARIPiprazole (ABILIFY) 10 MG tablet TAKE 1 TABLET (10 MG TOTAL) BY MOUTH DAILY. 05/17/22 05/17/23  Cottle, CBilley Co, MD  benzonatate (TESSALON) 100 MG capsule Take 1 capsule (100 mg total) by mouth 3 (three) times daily as needed for cough. 05/27/22   BBarrett Henle MD  Calcium  Carb-Cholecalciferol (CALCIUM 600+D) 600-800 MG-UNIT TABS Take 2 tablets by mouth daily.    [provider]  cefdinir (OMNICEF) 300 MG capsule Take 2 capsules (600 mg total) by mouth daily for 5 days. 05/27/22 06/01/22  Barrett Henle, MD  Cholecalciferol 1.25 MG (50000 UT) capsule Take 1 capsule (50,000 Units total) by mouth once a week for 12 weeks. 08/06/21   Briscoe Deutscher, DO  COVID-19 mRNA vaccine [phone] (COMIRNATY) syringe Inject into the muscle. 05/17/22     ibuprofen (ADVIL) 200 MG tablet Take 800 mg by mouth every 6 (six) hours as needed for moderate pain or headache.    [provider]  Melatonin 5 MG CHEW Chew 10 mg by mouth daily.    [provider]  PARoxetine (PAXIL) 40 MG tablet Take 2 tablets (80 mg total) by mouth at bedtime. 04/12/22 04/12/23  Cottle, Billey Co., MD  Phentermine-Topiramate (QSYMIA) 15-92 MG CP24 Take 1 capsule by mouth daily. 02/15/22     polyethylene glycol (MIRALAX / GLYCOLAX) 17 g packet Take 17 g by mouth  daily.    [provider]  propranolol (INDERAL) 20 MG tablet Take 1-2 tablets (20-40 mg total) by mouth daily as needed (anxiety). 04/09/21   Cottle, Billey Co., MD  Semaglutide, 1 MG/DOSE, (OZEMPIC, 1 MG/DOSE,) 4 MG/3ML SOPN Inject 1 mg as directed once a week.    [provider]  tirzepatide (ZEPBOUND) 10 MG/0.5ML Pen Inject 10 mg into the skin once a week. 05/12/22     topiramate (TOPAMAX) 50 MG tablet TAKE 2 TABLETS (100 MG TOTAL) BY MOUTH 2 (TWO) TIMES DAILY. 10/08/21 10/08/22  Purnell Shoemaker., MD  traZODone (DESYREL) 50 MG tablet TAKE 2 TABLETS (100 MG TOTAL) BY MOUTH AT BEDTIME. 10/08/21 10/08/22  Cottle, Billey Co., MD  zolpidem (AMBIEN) 10 MG tablet Take 1 tablet (10 mg total) by mouth at bedtime as needed for sleep. 04/29/22   Cottle, Billey Co., MD    Family History Family History  Problem Relation Age of Onset   Colon polyps Mother    Thyroid disease Mother    Hyperlipidemia Mother    Diabetes Mother    Hypertension Mother    Diabetes Father    Hypertension Father    Hyperlipidemia Father    Mental illness Sister    Diabetes Paternal Aunt    Hypertension Paternal Uncle    Diabetes Paternal Grandmother    Hypertension Paternal Grandmother    Hyperlipidemia Paternal Grandmother    Colon cancer Neg Hx    Esophageal cancer Neg Hx    Rectal cancer Neg Hx    Stomach cancer Neg Hx     Social History Social History   Tobacco Use   Smoking status: Never   Smokeless tobacco: Never  Vaping Use   Vaping Use: Never used  Substance Use Topics   Alcohol use: No    Alcohol/week: 0.0 standard drinks of alcohol   Drug use: No     Allergies   Patient has no known allergies.   Review of Systems Review of Systems   Physical Exam Triage Vital Signs ED Triage Vitals  Enc Vitals Group     BP 05/27/22 1734 105/73     Pulse Rate 05/27/22 1734 (!) 118     Resp 05/27/22 1734 17     Temp 05/27/22 1734 99.8 F (37.7 C)     Temp Source 05/27/22 1734 Oral      SpO2 05/27/22 1734 96 %  Weight --      Height --      Head Circumference --      Peak Flow --      Pain Score 05/27/22 1732 6     Pain Loc --      Pain Edu? --      Excl. in [place]? --    No data found.  Updated Vital Signs BP 105/73 (BP Location: Left Arm)   Pulse (!) 118   Temp 99.8 F (37.7 C) (Oral)   Resp 17   LMP 04/03/2018   SpO2 96%   Visual Acuity Right Eye Distance:   Left Eye Distance:   Bilateral Distance:    Right Eye Near:   Left Eye Near:    Bilateral Near:     Physical Exam Vitals reviewed.  Constitutional:      General: She is not in acute distress.    Appearance: She is not ill-appearing, toxic-appearing or diaphoretic.  HENT:     Left Ear: Tympanic membrane and ear canal normal.     Ears:     Comments: Tympanic membrane is injected and dull.    Nose: Congestion present.     Mouth/Throat:     Mouth: Mucous membranes are moist.     Pharynx: No oropharyngeal exudate.     Comments: There is some erythema of the soft palate.  No swelling Eyes:     Extraocular Movements: Extraocular movements intact.     Conjunctiva/sclera: Conjunctivae normal.     Pupils: Pupils are equal, round, and reactive to light.  Cardiovascular:     Rate and Rhythm: Normal rate and regular rhythm.     Heart sounds: No murmur heard. Pulmonary:     Effort: Pulmonary effort is normal. No respiratory distress.     Breath sounds: No stridor. No wheezing, rhonchi or rales.  Musculoskeletal:     Cervical back: Neck supple.  Skin:    Coloration: Skin is not jaundiced or pale.  Neurological:     General: No focal deficit present.     Mental Status: She is alert and oriented to person, place, and time.  Psychiatric:        Behavior: Behavior normal.      UC Treatments / Results  Labs (all labs ordered are listed, but only abnormal results are displayed) Labs Reviewed  SARS CORONAVIRUS 2 (TAT 6-24 HRS)  POC INFLUENZA A AND B ANTIGEN (URGENT CARE ONLY)     EKG   Radiology No results found.  Procedures Procedures (including critical care time)  Medications Ordered in UC Medications - No data to display  Initial Impression / Assessment and Plan / UC Course  I have reviewed the triage vital signs and the nursing notes.  Pertinent labs & imaging results that were available during my care of the patient were reviewed by me and considered in my medical decision making (see chart for details).        Her flu test is negative.  She is swabbed for COVID so she knows if she needs to be treated for that.  If COVID is positive, she is a candidate for Paxlovid.  Her last EGFR was 70.3 and August 2023.  Antibiotics are sent in for the otitis media Final Clinical Impressions(s) / UC Diagnoses   Final diagnoses:  Viral URI  Right otitis media, unspecified otitis media type     Discharge Instructions      Flu test was negative  Take cefdinir 300 mg--2  capsules together daily for 5 days; this is for the ear infection  Take benzonatate 100 mg, 1 tab every 8 hours as needed for cough.   You have been swabbed for COVID, and the test will result in the next 24 hours. Our staff will call you if positive. If the COVID test is positive, you should quarantine for 5 days from the start of your symptoms.  On days 6-10 from the start of your illness, you should wear a mask if out in public.        ED Prescriptions     Medication Sig Dispense Auth. Provider   cefdinir (OMNICEF) 300 MG capsule  (Status: Discontinued) Take 2 capsules (600 mg total) by mouth daily for 5 days. 10 capsule Barrett Henle, MD   benzonatate (TESSALON) 100 MG capsule  (Status: Discontinued) Take 1 capsule (100 mg total) by mouth 3 (three) times daily as needed for cough. 21 capsule Barrett Henle, MD   benzonatate (TESSALON) 100 MG capsule Take 1 capsule (100 mg total) by mouth 3 (three) times daily as needed for cough. 21 capsule Barrett Henle, MD    cefdinir (OMNICEF) 300 MG capsule Take 2 capsules (600 mg total) by mouth daily for 5 days. 10 capsule Barrett Henle, MD      PDMP not reviewed this encounter.   Barrett Henle, MD 05/27/22 [phone]

## 2022-05-27 NOTE — ED Triage Notes (Signed)
Pt had chills, body aches, fevers, congestion, ear pressure for a couple days. Took ibuprofen

## 2022-05-27 NOTE — Discharge Instructions (Signed)
Flu test was negative  Take cefdinir 300 mg--2 capsules together daily for 5 days; this is for the ear infection  Take benzonatate 100 mg, 1 tab every 8 hours as needed for cough.   You have been swabbed for COVID, and the test will result in the next 24 hours. Our staff will call you if positive. If the COVID test is positive, you should quarantine for 5 days from the start of your symptoms.  On days 6-10 from the start of your illness, you should wear a mask if out in public.

## 2022-05-28 ENCOUNTER — Ambulatory Visit: Payer: 59 | Admitting: Family Medicine

## 2022-05-28 ENCOUNTER — Other Ambulatory Visit (HOSPITAL_BASED_OUTPATIENT_CLINIC_OR_DEPARTMENT_OTHER): Payer: Self-pay

## 2022-05-28 LAB — SARS CORONAVIRUS 2 (TAT 6-24 HRS): SARS Coronavirus 2: NEGATIVE

## 2022-06-16 ENCOUNTER — Ambulatory Visit: Payer: No Typology Code available for payment source | Admitting: Psychiatry

## 2022-06-17 DIAGNOSIS — Z6824 Body mass index (BMI) 24.0-24.9, adult: Secondary | ICD-10-CM | POA: Diagnosis not present

## 2022-06-17 DIAGNOSIS — E559 Vitamin D deficiency, unspecified: Secondary | ICD-10-CM | POA: Diagnosis not present

## 2022-06-17 DIAGNOSIS — Z8639 Personal history of other endocrine, nutritional and metabolic disease: Secondary | ICD-10-CM | POA: Diagnosis not present

## 2022-06-17 DIAGNOSIS — M818 Other osteoporosis without current pathological fracture: Secondary | ICD-10-CM | POA: Diagnosis not present

## 2022-06-17 DIAGNOSIS — F439 Reaction to severe stress, unspecified: Secondary | ICD-10-CM | POA: Diagnosis not present

## 2022-06-17 DIAGNOSIS — R632 Polyphagia: Secondary | ICD-10-CM | POA: Diagnosis not present

## 2022-06-21 ENCOUNTER — Other Ambulatory Visit (HOSPITAL_BASED_OUTPATIENT_CLINIC_OR_DEPARTMENT_OTHER): Payer: Self-pay

## 2022-06-21 MED ORDER — ZEPBOUND 10 MG/0.5ML ~~LOC~~ SOAJ
10.0000 mg | SUBCUTANEOUS | 0 refills | Status: DC
  Filled 2022-06-21 – 2022-07-06 (×3): qty 6, 84d supply, fill #0
  Filled 2022-07-27: qty 2, 28d supply, fill #0

## 2022-07-05 ENCOUNTER — Other Ambulatory Visit: Payer: Self-pay | Admitting: Psychiatry

## 2022-07-05 DIAGNOSIS — F411 Generalized anxiety disorder: Secondary | ICD-10-CM

## 2022-07-05 DIAGNOSIS — F4001 Agoraphobia with panic disorder: Secondary | ICD-10-CM

## 2022-07-05 DIAGNOSIS — F5105 Insomnia due to other mental disorder: Secondary | ICD-10-CM

## 2022-07-05 MED ORDER — TOPIRAMATE 50 MG PO TABS
100.0000 mg | ORAL_TABLET | Freq: Two times a day (BID) | ORAL | 0 refills | Status: DC
  Filled 2022-07-05: qty 360, 90d supply, fill #0

## 2022-07-05 MED ORDER — TRAZODONE HCL 50 MG PO TABS
100.0000 mg | ORAL_TABLET | Freq: Every evening | ORAL | 0 refills | Status: DC
  Filled 2022-07-05: qty 180, 90d supply, fill #0

## 2022-07-05 MED ORDER — PROPRANOLOL HCL 20 MG PO TABS
20.0000 mg | ORAL_TABLET | Freq: Every day | ORAL | 0 refills | Status: DC | PRN
  Filled 2022-07-05: qty 60, 30d supply, fill #0

## 2022-07-06 ENCOUNTER — Other Ambulatory Visit (HOSPITAL_BASED_OUTPATIENT_CLINIC_OR_DEPARTMENT_OTHER): Payer: Self-pay

## 2022-07-06 ENCOUNTER — Other Ambulatory Visit: Payer: Self-pay

## 2022-07-19 ENCOUNTER — Other Ambulatory Visit: Payer: Self-pay | Admitting: Psychiatry

## 2022-07-19 DIAGNOSIS — F4001 Agoraphobia with panic disorder: Secondary | ICD-10-CM

## 2022-07-19 DIAGNOSIS — F411 Generalized anxiety disorder: Secondary | ICD-10-CM

## 2022-07-19 DIAGNOSIS — F3342 Major depressive disorder, recurrent, in full remission: Secondary | ICD-10-CM

## 2022-07-20 ENCOUNTER — Other Ambulatory Visit: Payer: Self-pay

## 2022-07-20 NOTE — Telephone Encounter (Signed)
Please call to schedule appt..

## 2022-07-20 NOTE — Telephone Encounter (Signed)
Lvm for patient to call back and schedule °

## 2022-07-21 ENCOUNTER — Other Ambulatory Visit (HOSPITAL_BASED_OUTPATIENT_CLINIC_OR_DEPARTMENT_OTHER): Payer: Self-pay

## 2022-07-22 ENCOUNTER — Other Ambulatory Visit: Payer: Self-pay | Admitting: Psychiatry

## 2022-07-22 ENCOUNTER — Other Ambulatory Visit (HOSPITAL_BASED_OUTPATIENT_CLINIC_OR_DEPARTMENT_OTHER): Payer: Self-pay

## 2022-07-22 DIAGNOSIS — F4001 Agoraphobia with panic disorder: Secondary | ICD-10-CM

## 2022-07-22 DIAGNOSIS — F411 Generalized anxiety disorder: Secondary | ICD-10-CM

## 2022-07-22 DIAGNOSIS — F3342 Major depressive disorder, recurrent, in full remission: Secondary | ICD-10-CM

## 2022-07-22 MED ORDER — PAROXETINE HCL 40 MG PO TABS
80.0000 mg | ORAL_TABLET | Freq: Every day | ORAL | 0 refills | Status: DC
  Filled 2022-07-22: qty 60, 30d supply, fill #0

## 2022-07-22 NOTE — Telephone Encounter (Signed)
Pt has an appt 08/17/22

## 2022-07-23 ENCOUNTER — Other Ambulatory Visit (HOSPITAL_BASED_OUTPATIENT_CLINIC_OR_DEPARTMENT_OTHER): Payer: Self-pay

## 2022-07-27 ENCOUNTER — Other Ambulatory Visit (HOSPITAL_BASED_OUTPATIENT_CLINIC_OR_DEPARTMENT_OTHER): Payer: Self-pay

## 2022-07-28 ENCOUNTER — Ambulatory Visit
Admission: RE | Admit: 2022-07-28 | Discharge: 2022-07-28 | Disposition: A | Discharge status: Home / Self Care | Payer: 59 | Source: Ambulatory Visit | Attending: Family Medicine | Admitting: Family Medicine

## 2022-07-28 ENCOUNTER — Other Ambulatory Visit (HOSPITAL_BASED_OUTPATIENT_CLINIC_OR_DEPARTMENT_OTHER): Payer: Self-pay

## 2022-07-28 ENCOUNTER — Encounter: Payer: Self-pay | Admitting: Family Medicine

## 2022-07-28 ENCOUNTER — Other Ambulatory Visit: Payer: Self-pay

## 2022-07-28 DIAGNOSIS — M85852 Other specified disorders of bone density and structure, left thigh: Secondary | ICD-10-CM | POA: Diagnosis not present

## 2022-07-28 DIAGNOSIS — E2839 Other primary ovarian failure: Secondary | ICD-10-CM

## 2022-07-28 DIAGNOSIS — Z780 Asymptomatic menopausal state: Secondary | ICD-10-CM | POA: Diagnosis not present

## 2022-07-28 DIAGNOSIS — M810 Age-related osteoporosis without current pathological fracture: Secondary | ICD-10-CM | POA: Diagnosis not present

## 2022-07-28 MED ORDER — ALENDRONATE SODIUM 70 MG PO TABS
70.0000 mg | ORAL_TABLET | ORAL | 3 refills | Status: DC
  Filled 2022-07-28: qty 12, 84d supply, fill #0
  Filled 2022-10-18: qty 12, 84d supply, fill #1
  Filled 2022-11-18 – 2023-01-15 (×2): qty 12, 84d supply, fill #2
  Filled 2023-04-04: qty 12, 84d supply, fill #3

## 2022-07-29 ENCOUNTER — Other Ambulatory Visit: Payer: Self-pay | Admitting: Psychiatry

## 2022-07-29 DIAGNOSIS — F5105 Insomnia due to other mental disorder: Secondary | ICD-10-CM

## 2022-08-01 NOTE — Telephone Encounter (Signed)
Due 4/2

## 2022-08-03 ENCOUNTER — Ambulatory Visit (INDEPENDENT_AMBULATORY_CARE_PROVIDER_SITE_OTHER): Payer: 59 | Admitting: Family Medicine

## 2022-08-03 ENCOUNTER — Ambulatory Visit (HOSPITAL_BASED_OUTPATIENT_CLINIC_OR_DEPARTMENT_OTHER)
Admission: RE | Admit: 2022-08-03 | Discharge: 2022-08-03 | Disposition: A | Discharge status: Home / Self Care | Payer: 59 | Source: Ambulatory Visit | Attending: Family Medicine | Admitting: Family Medicine

## 2022-08-03 ENCOUNTER — Other Ambulatory Visit (HOSPITAL_BASED_OUTPATIENT_CLINIC_OR_DEPARTMENT_OTHER): Payer: Self-pay

## 2022-08-03 VITALS — BP 126/76 | HR 90 | Temp 97.6°F | Resp 12 | Ht 63.0 in | Wt 144.4 lb

## 2022-08-03 DIAGNOSIS — M25551 Pain in right hip: Secondary | ICD-10-CM

## 2022-08-03 DIAGNOSIS — M25552 Pain in left hip: Secondary | ICD-10-CM | POA: Diagnosis not present

## 2022-08-03 MED ORDER — CELECOXIB 100 MG PO CAPS
100.0000 mg | ORAL_CAPSULE | Freq: Two times a day (BID) | ORAL | 5 refills | Status: DC
  Filled 2022-08-03: qty 60, 30d supply, fill #0
  Filled 2022-09-23: qty 60, 30d supply, fill #1
  Filled 2022-10-18: qty 60, 30d supply, fill #2
  Filled 2022-11-18: qty 60, 30d supply, fill #3
  Filled 2022-12-20: qty 60, 30d supply, fill #4
  Filled 2023-01-17: qty 60, 30d supply, fill #5

## 2022-08-03 MED ORDER — ZOLPIDEM TARTRATE 10 MG PO TABS
10.0000 mg | ORAL_TABLET | Freq: Every evening | ORAL | 0 refills | Status: DC | PRN
  Filled 2022-08-03 (×2): qty 14, 14d supply, fill #0

## 2022-08-03 NOTE — Patient Instructions (Signed)
Hip Pain The hip is the joint between the upper legs and the lower pelvis. The bones, cartilage, tendons, and muscles of your hip joint support your body and allow you to move around. Hip pain can range from a minor ache to severe pain in one or both of your hips. The pain may be felt on the inside of the hip joint near the groin, or on the outside near the buttocks and upper thigh. You may also have swelling or stiffness in your hip area. Follow these instructions at home: Managing pain, stiffness, and swelling     If told, put ice on the painful area. Put ice in a plastic bag. Place a towel between your skin and the bag. Leave the ice on for 20 minutes, 2-3 times a day. If told, apply heat to the affected area as often as told by your health care provider. Use the heat source that your provider recommends, such as a moist heat pack or a heating pad. Place a towel between your skin and the heat source. Leave the heat on for 20-30 minutes. If your skin turns bright red, remove the ice or heat right away to prevent skin damage. The risk of damage is higher if you cannot feel pain, heat, or cold. Activity Do exercises as told by your provider. Avoid activities that cause pain. General instructions  Take over-the-counter and prescription medicines only as told by your provider. Keep a journal of your symptoms. Write down: How often you have hip pain. The location of your pain. What the pain feels like. What makes the pain worse. Sleep with a pillow between your legs on your most comfortable side. Keep all follow-up visits. Your provider will monitor your pain and activity. Contact a health care provider if: You cannot put weight on your leg. Your pain or swelling gets worse after a week. It gets harder to walk. You have a fever. Get help right away if: You fall. You have a sudden increase in pain and swelling in your hip. Your hip is red or swollen or very tender to touch. This  information is not intended to replace advice given to you by your health care provider. Make sure you discuss any questions you have with your health care provider. Document Revised: 12/22/2021 Document Reviewed: 12/22/2021 Elsevier Patient Education  2023 Elsevier Inc.  

## 2022-08-03 NOTE — Progress Notes (Signed)
Subjective:   By signing my name below, I, Brooke [name], attest that this documentation has been prepared under the direction and in the presence of Ann Held, DO. 08/03/2022   Patient ID: Brooke [name], female    DOB: 08-21-1970, 52 y.o.   MRN: [id number]  No chief complaint on file.   HPI Patient is in today for office visit.  She complains of hip pain that worsens while she sleeps. She had a history of off and on hip pain prior to this but thinks it worsened to its current state. She has a history of arthritis and bursitis in her right hip. Her last bone density was 07/28/2022 and results showed she is osteoporotic. She takes occasional ibuprofen but finds no relief in her symptoms.  Her insurance could did not approve her taking zepbound so she has not started it. She is still interested in taking medication to help assist with weight loss.  Wt Readings from Last 3 Encounters:  08/03/22 144 lb 6.4 oz (65.5 kg)  03/18/22 152 lb 1.9 oz (69 kg)  12/31/21 152 lb 3.2 oz (69 kg)    Past Medical History:  Diagnosis Date   Anxiety    Arthritis    right shoulder   Bursitis    Constipation    Depression    Fatty liver    seen on [place] 04-2018   HLD (hyperlipidemia)    Hyperglycemia 03/05/2013   Hypotension    Incontinence    Lower extremity edema    Lumbar stress fracture    Obesity    Osteoporosis    SOB (shortness of breath)     Past Surgical History:  Procedure Laterality Date   BREAST BIOPSY Right 12/27/2018   NEG   COLONOSCOPY     EXCISION/RELEASE BURSA HIP Right 04/11/2019   Procedure: Right hip bursectomy;  Surgeon: [name], MD;  Location: [facility];  Service: Orthopedics;  Laterality: Right;  16min   RHINOPLASTY     SINUS ENDO W/FUSION     TONSILLECTOMY      Family History  Problem Relation Age of Onset   Colon polyps Mother    Thyroid disease Mother    Hyperlipidemia Mother    Diabetes Mother    Hypertension Mother    Diabetes Father     Hypertension Father    Hyperlipidemia Father    Mental illness Sister    Diabetes Paternal Aunt    Hypertension Paternal Uncle    Diabetes Paternal Grandmother    Hypertension Paternal Grandmother    Hyperlipidemia Paternal Grandmother    Colon cancer Neg Hx    Esophageal cancer Neg Hx    Rectal cancer Neg Hx    Stomach cancer Neg Hx     Social History   Socioeconomic History   Marital status: Married    Spouse name: John   Number of children: Not on file   Years of education: 16   Highest education level: Not on file  Occupational History   Occupation: RN/short stay    Employer: [employer]  Tobacco Use   Smoking status: Never   Smokeless tobacco: Never  Vaping Use   Vaping Use: Never used  Substance and Sexual Activity   Alcohol use: No    Alcohol/week: 0.0 standard drinks of alcohol   Drug use: No   Sexual activity: Yes    Partners: Male    Birth control/protection: Surgical    Comment: Husband's vasectomy  Other Topics Concern  Not on file  Social History Narrative   Not on file   Social Determinants of Health   Financial Resource Strain: Not on file  Food Insecurity: Not on file  Transportation Needs: Not on file  Physical Activity: Not on file  Stress: Not on file  Social Connections: Not on file  Intimate Partner Violence: Not on file    Outpatient Medications Prior to Visit  Medication Sig Dispense Refill   alendronate (FOSAMAX) 70 MG tablet Take 1 tablet (70 mg total) by mouth every 7 (seven) days. Take with a full glass of water on an empty stomach. 12 tablet 3   acetaminophen (TYLENOL) 500 MG tablet Take 1,000 mg by mouth every 6 (six) hours as needed for moderate pain.     ALPRAZolam (ALPRAZOLAM XR) 0.5 MG 24 hr tablet Take 1 tablet (0.5 mg total) by mouth in the morning as needed for anxiety and take 2 tablets (1 mg total) by mouth at bedtime for anxiety and sleep. 270 tablet 0   AMBULATORY NON FORMULARY MEDICATION Medication Name: Diltiazem  2% compound with Lidocaine 5% 30 g 0   ARIPiprazole (ABILIFY) 10 MG tablet TAKE 1 TABLET (10 MG TOTAL) BY MOUTH DAILY. 30 tablet 11   benzonatate (TESSALON) 100 MG capsule Take 1 capsule (100 mg total) by mouth 3 (three) times daily as needed for cough. 21 capsule 0   Calcium Carb-Cholecalciferol (CALCIUM 600+D) 600-800 MG-UNIT TABS Take 2 tablets by mouth daily.     Cholecalciferol 1.25 MG (50000 UT) capsule Take 1 capsule (50,000 Units total) by mouth once a week for 12 weeks. 12 capsule 0   COVID-19 mRNA vaccine 2023-2024 (COMIRNATY) syringe Inject into the muscle. 0.3 mL 0   ibuprofen (ADVIL) 200 MG tablet Take 800 mg by mouth every 6 (six) hours as needed for moderate pain or headache.     Melatonin 5 MG CHEW Chew 10 mg by mouth daily.     PARoxetine (PAXIL) 40 MG tablet Take 2 tablets (80 mg total) by mouth at bedtime. 60 tablet 0   Phentermine-Topiramate (QSYMIA) 15-92 MG CP24 Take 1 capsule by mouth daily. 30 capsule 1   polyethylene glycol (MIRALAX / GLYCOLAX) 17 g packet Take 17 g by mouth daily.     propranolol (INDERAL) 20 MG tablet Take 1-2 tablets (20-40 mg total) by mouth daily as needed (anxiety). 60 tablet 0   Semaglutide, 1 MG/DOSE, (OZEMPIC, 1 MG/DOSE,) 4 MG/3ML SOPN Inject 1 mg as directed once a week.     tirzepatide (ZEPBOUND) 10 MG/0.5ML Pen Inject 10 mg into the skin once a week. 6 mL 0   topiramate (TOPAMAX) 50 MG tablet Take 2 tablets (100 mg total) by mouth 2 (two) times daily. 360 tablet 0   traZODone (DESYREL) 50 MG tablet Take 2 tablets (100 mg total) by mouth at bedtime. 180 tablet 0   zolpidem (AMBIEN) 10 MG tablet Take 1 tablet (10 mg total) by mouth at bedtime as needed for sleep. 90 tablet 0   No facility-administered medications prior to visit.    No Known Allergies  Review of Systems  Musculoskeletal:        (+)hip pain       Objective:    Physical Exam Constitutional:      General: She is not in acute distress.    Appearance: Normal appearance.  She is not ill-appearing.  HENT:     Head: Normocephalic and atraumatic.     Right Ear: External ear normal.  Left Ear: External ear normal.  Eyes:     Extraocular Movements: Extraocular movements intact.     Pupils: Pupils are equal, round, and reactive to light.  Cardiovascular:     Rate and Rhythm: Normal rate and regular rhythm.     Heart sounds: Normal heart sounds. No murmur heard.    No gallop.  Pulmonary:     Effort: Pulmonary effort is normal. No respiratory distress.     Breath sounds: Normal breath sounds. No wheezing or rales.  Musculoskeletal:     Comments: 5/5 strength in lower extremities Mild Hip pain with hip abduction Hip Pain lumbar flexion Hip Pain during lumbar extension  Skin:    General: Skin is warm and dry.  Neurological:     Mental Status: She is alert and oriented to person, place, and time.  Psychiatric:        Judgment: Judgment normal.     LMP 04/03/2018  Wt Readings from Last 3 Encounters:  03/18/22 152 lb 1.9 oz (69 kg)  12/31/21 152 lb 3.2 oz (69 kg)  12/18/21 147 lb 12.8 oz (67 kg)       Assessment & Plan:  There are no diagnoses linked to this encounter.  I, Brooke [name], personally preformed the services described in this documentation.  All medical record entries made by the scribe were at my direction and in my presence.  I have reviewed the chart and discharge instructions (if applicable) and agree that the record reflects my personal performance and is accurate and complete. 08/03/2022   I,Brooke [name],acting as a scribe for Ann Held, DO.,have documented all relevant documentation on the behalf of Ann Held, DO,as directed by  Ann Held, DO while in the presence of Ann Held, DO.   Brooke [name]

## 2022-08-04 ENCOUNTER — Other Ambulatory Visit (HOSPITAL_BASED_OUTPATIENT_CLINIC_OR_DEPARTMENT_OTHER): Payer: Self-pay

## 2022-08-04 ENCOUNTER — Encounter: Payer: Self-pay | Admitting: Family Medicine

## 2022-08-06 ENCOUNTER — Encounter: Payer: Self-pay | Admitting: Family Medicine

## 2022-08-16 ENCOUNTER — Other Ambulatory Visit (HOSPITAL_BASED_OUTPATIENT_CLINIC_OR_DEPARTMENT_OTHER): Payer: Self-pay

## 2022-08-16 DIAGNOSIS — M818 Other osteoporosis without current pathological fracture: Secondary | ICD-10-CM | POA: Diagnosis not present

## 2022-08-16 DIAGNOSIS — Z6824 Body mass index (BMI) 24.0-24.9, adult: Secondary | ICD-10-CM | POA: Diagnosis not present

## 2022-08-16 DIAGNOSIS — R632 Polyphagia: Secondary | ICD-10-CM | POA: Diagnosis not present

## 2022-08-16 DIAGNOSIS — E782 Mixed hyperlipidemia: Secondary | ICD-10-CM | POA: Diagnosis not present

## 2022-08-16 DIAGNOSIS — Z8639 Personal history of other endocrine, nutritional and metabolic disease: Secondary | ICD-10-CM | POA: Diagnosis not present

## 2022-08-16 MED ORDER — ZEPBOUND 12.5 MG/0.5ML ~~LOC~~ SOAJ
12.5000 mg | SUBCUTANEOUS | 0 refills | Status: DC
  Filled 2022-08-16: qty 2, 28d supply, fill #0

## 2022-08-16 MED ORDER — ZEPBOUND 15 MG/0.5ML ~~LOC~~ SOAJ: 15.0000 mg | SUBCUTANEOUS | 0 refills | Status: DC

## 2022-08-16 MED ORDER — PHENTERMINE HCL 37.5 MG PO TABS
18.7500 mg | ORAL_TABLET | Freq: Every day | ORAL | 0 refills | Status: DC
  Filled 2022-08-16: qty 30, 30d supply, fill #0

## 2022-08-17 ENCOUNTER — Ambulatory Visit (INDEPENDENT_AMBULATORY_CARE_PROVIDER_SITE_OTHER): Payer: Self-pay | Admitting: Psychiatry

## 2022-08-17 ENCOUNTER — Other Ambulatory Visit: Payer: Self-pay | Admitting: Psychiatry

## 2022-08-17 ENCOUNTER — Telehealth: Payer: Self-pay | Admitting: Psychiatry

## 2022-08-17 DIAGNOSIS — F3342 Major depressive disorder, recurrent, in full remission: Secondary | ICD-10-CM

## 2022-08-17 DIAGNOSIS — F411 Generalized anxiety disorder: Secondary | ICD-10-CM

## 2022-08-17 DIAGNOSIS — F4001 Agoraphobia with panic disorder: Secondary | ICD-10-CM

## 2022-08-17 DIAGNOSIS — Z91199 Patient's noncompliance with other medical treatment and regimen due to unspecified reason: Secondary | ICD-10-CM

## 2022-08-17 NOTE — Progress Notes (Signed)
No show

## 2022-08-17 NOTE — Telephone Encounter (Signed)
Patient came into office for an appt she thought was at 5 but was at 4pm. She has RS for 5/28. She needs refills for Abilify  and Paxil  sent to [facility] [address] [place], [place]

## 2022-08-18 ENCOUNTER — Other Ambulatory Visit: Payer: Self-pay

## 2022-08-18 ENCOUNTER — Other Ambulatory Visit: Payer: Self-pay | Admitting: Psychiatry

## 2022-08-18 ENCOUNTER — Other Ambulatory Visit (HOSPITAL_BASED_OUTPATIENT_CLINIC_OR_DEPARTMENT_OTHER): Payer: Self-pay

## 2022-08-18 DIAGNOSIS — F5105 Insomnia due to other mental disorder: Secondary | ICD-10-CM

## 2022-08-18 MED ORDER — PAROXETINE HCL 40 MG PO TABS
80.0000 mg | ORAL_TABLET | Freq: Every day | ORAL | 0 refills | Status: DC
Start: 2022-08-18 — End: 2022-09-24
  Filled 2022-08-18: qty 60, 30d supply, fill #0

## 2022-08-18 MED ORDER — ZOLPIDEM TARTRATE 10 MG PO TABS
10.0000 mg | ORAL_TABLET | Freq: Every evening | ORAL | 1 refills | Status: DC | PRN
Start: 2022-08-18 — End: 2022-09-28
  Filled 2022-08-18: qty 30, 30d supply, fill #0
  Filled 2022-09-23: qty 30, 30d supply, fill #1

## 2022-08-18 NOTE — Telephone Encounter (Signed)
Patient had RF available for Abilify. Sent RF for Paxil to requested pharmacy.

## 2022-08-19 ENCOUNTER — Other Ambulatory Visit (HOSPITAL_BASED_OUTPATIENT_CLINIC_OR_DEPARTMENT_OTHER): Payer: Self-pay

## 2022-09-16 ENCOUNTER — Encounter: Payer: Self-pay | Admitting: Family Medicine

## 2022-09-16 ENCOUNTER — Ambulatory Visit (INDEPENDENT_AMBULATORY_CARE_PROVIDER_SITE_OTHER): Payer: 59 | Admitting: Family Medicine

## 2022-09-16 ENCOUNTER — Other Ambulatory Visit (HOSPITAL_BASED_OUTPATIENT_CLINIC_OR_DEPARTMENT_OTHER): Payer: Self-pay

## 2022-09-16 VITALS — BP 100/80 | HR 85 | Temp 98.8°F | Resp 16 | Ht 63.0 in | Wt 141.2 lb

## 2022-09-16 DIAGNOSIS — J0140 Acute pansinusitis, unspecified: Secondary | ICD-10-CM

## 2022-09-16 MED ORDER — AMOXICILLIN-POT CLAVULANATE 875-125 MG PO TABS
1.0000 | ORAL_TABLET | Freq: Two times a day (BID) | ORAL | 0 refills | Status: DC
Start: 2022-09-16 — End: 2023-08-26
  Filled 2022-09-16: qty 20, 10d supply, fill #0

## 2022-09-16 MED ORDER — FLUTICASONE PROPIONATE 50 MCG/ACT NA SUSP
2.0000 | Freq: Every day | NASAL | 6 refills | Status: DC
Start: 2022-09-16 — End: 2024-03-12
  Filled 2022-09-16: qty 16, 30d supply, fill #0

## 2022-09-16 MED ORDER — AZELASTINE HCL 0.1 % NA SOLN
1.0000 | Freq: Two times a day (BID) | NASAL | 12 refills | Status: DC
Start: 2022-09-16 — End: 2023-12-13
  Filled 2022-09-16: qty 30, 90d supply, fill #0
  Filled 2023-05-31: qty 30, 90d supply, fill #1

## 2022-09-16 NOTE — Progress Notes (Signed)
Subjective:   By signing my name below, I, Shehryar Baig, attest that this documentation has been prepared under the direction and in the presence of Donato Schultz, DO. 09/16/2022   Patient ID: Brooke [name], female    DOB: 01-25-1971, 52 y.o.   MRN: [id number]  Chief Complaint  Patient presents with   Sinus Problem    X1 week, pt states having congestion, productive cough, sore throat, pressure in face, eyes, and ears. Pt states trying Sudafed. No relief. No COVID test.     Sinus Problem Associated symptoms include congestion, coughing (productive cough), ear pain and a sore throat. Pertinent negatives include no headaches or shortness of breath.   Patient is in today for a office visit.   She complains of congestion, sore throat, productive cough, pressure face, eyes, and ears for the past week. She is taking sudafed to manage her symptoms. She did not test for Covid-19.    Past Medical History:  Diagnosis Date   Anxiety    Arthritis    right shoulder   Back pain 12/13/2011   Bursitis    Constipation    Depression    Fatty liver    seen on [place] 04-2018   HLD (hyperlipidemia)    Hyperglycemia 03/05/2013   Hypotension    Incontinence    Lower extremity edema    Lumbar stress fracture    Obesity    Osteoporosis    SOB (shortness of breath)     Past Surgical History:  Procedure Laterality Date   BREAST BIOPSY Right 12/27/2018   NEG   COLONOSCOPY     EXCISION/RELEASE BURSA HIP Right 04/11/2019   Procedure: Right hip bursectomy;  Surgeon: Ranee Gosselin, MD;  Location: [facility];  Service: Orthopedics;  Laterality: Right;    RHINOPLASTY     SINUS ENDO W/FUSION     TONSILLECTOMY      Family History  Problem Relation Age of Onset   Colon polyps Mother    Thyroid disease Mother    Hyperlipidemia Mother    Diabetes Mother    Hypertension Mother    Diabetes Father    Hypertension Father    Hyperlipidemia Father    Mental illness Sister     Diabetes Paternal Aunt    Hypertension Paternal Uncle    Diabetes Paternal Grandmother    Hypertension Paternal Grandmother    Hyperlipidemia Paternal Grandmother    Colon cancer Neg Hx    Esophageal cancer Neg Hx    Rectal cancer Neg Hx    Stomach cancer Neg Hx     Social History   Socioeconomic History   Marital status: Married    Spouse name: John   Number of children: Not on file   Years of education: 16   Highest education level: Associate degree: academic program  Occupational History   Occupation: RN/short stay    Employer: [employer]  Tobacco Use   Smoking status: Never   Smokeless tobacco: Never  Vaping Use   Vaping Use: Never used  Substance and Sexual Activity   Alcohol use: No    Alcohol/week: 0.0 standard drinks of alcohol   Drug use: No   Sexual activity: Yes    Partners: Male    Birth control/protection: Surgical    Comment: Husband's vasectomy  Other Topics Concern   Not on file  Social History Narrative   Not on file   Social Determinants of Health   Financial Resource Strain: Low Risk  (  08/03/2022)   Overall Financial Resource Strain (CARDIA)    Difficulty of Paying Living Expenses: Not hard at all  Food Insecurity: Unknown (08/03/2022)   Hunger Vital Sign    Worried About Running Out of Food in the Last Year: Patient declined    Ran Out of Food in the Last Year: Never true  Transportation Needs: No Transportation Needs (08/03/2022)   PRAPARE - Administrator, Civil Service (Medical): No    Lack of Transportation (Non-Medical): No  Physical Activity: Unknown (08/03/2022)   Exercise Vital Sign    Days of Exercise per Week: 0 days    Minutes of Exercise per Session: Not on file  Stress: Stress Concern Present (08/03/2022)   [employer] of Occupational Health - Occupational Stress Questionnaire    Feeling of Stress : Rather much  Social Connections: Socially Integrated (08/03/2022)   Social Connection and Isolation Panel [NHANES]     Frequency of Communication with Friends and Family: More than three times a week    Frequency of Social Gatherings with Friends and Family: Once a week    Attends Religious Services: More than 4 times per year    Active Member of [employer] or Organizations: Yes    Attends Engineer, structural: More than 4 times per year    Marital Status: Married  Catering manager Violence: Not on file    Outpatient Medications Prior to Visit  Medication Sig Dispense Refill   acetaminophen (TYLENOL) 500 MG tablet Take 1,000 mg by mouth every 6 (six) hours as needed for moderate pain.     alendronate (FOSAMAX) 70 MG tablet Take 1 tablet (70 mg total) by mouth every 7 (seven) days. Take with a full glass of water on an empty stomach. 12 tablet 3   ALPRAZolam (ALPRAZOLAM XR) 0.5 MG 24 hr tablet Take 1 tablet (0.5 mg total) by mouth in the morning as needed for anxiety and take 2 tablets (1 mg total) by mouth at bedtime for anxiety and sleep. 270 tablet 0   AMBULATORY NON FORMULARY MEDICATION Medication Name: Diltiazem 2% compound with Lidocaine 5% 30 g 0   ARIPiprazole (ABILIFY) 10 MG tablet TAKE 1 TABLET (10 MG TOTAL) BY MOUTH DAILY. 30 tablet 11   Calcium Carb-Cholecalciferol (CALCIUM 600+D) 600-800 MG-UNIT TABS Take 2 tablets by mouth daily.     celecoxib (CELEBREX) 100 MG capsule Take 1 capsule (100 mg total) by mouth 2 (two) times daily. 60 capsule 5   ibuprofen (ADVIL) 200 MG tablet Take 800 mg by mouth every 6 (six) hours as needed for moderate pain or headache.     Melatonin 5 MG CHEW Chew 10 mg by mouth daily.     PARoxetine (PAXIL) 40 MG tablet Take 2 tablets (80 mg total) by mouth at bedtime. 60 tablet 0   phentermine (ADIPEX-P) 37.5 MG tablet Take 0.5-1 tablets (18.75-37.5 mg total) by mouth daily. 30 tablet 0   polyethylene glycol (MIRALAX / GLYCOLAX) 17 g packet Take 17 g by mouth daily.     propranolol (INDERAL) 20 MG tablet Take 1-2 tablets (20-40 mg total) by mouth daily as needed (anxiety).  60 tablet 0   topiramate (TOPAMAX) 50 MG tablet Take 2 tablets (100 mg total) by mouth 2 (two) times daily. 360 tablet 0   traZODone (DESYREL) 50 MG tablet Take 2 tablets (100 mg total) by mouth at bedtime. 180 tablet 0   zolpidem (AMBIEN) 10 MG tablet Take 1 tablet (10 mg total) by mouth  at bedtime as needed for sleep. 30 tablet 1   tirzepatide (ZEPBOUND) 10 MG/0.5ML Pen Inject 10 mg into the skin once a week. (Patient not taking: Reported on 09/16/2022) 6 mL 0   tirzepatide (ZEPBOUND) 12.5 MG/0.5ML Pen Inject 12.5 mg into the skin every 7 (seven) days. (Patient not taking: Reported on 09/16/2022) 2 mL 0   COVID-19 mRNA vaccine 2023-2024 (COMIRNATY) syringe Inject into the muscle. (Patient not taking: Reported on 09/16/2022) 0.3 mL 0   tirzepatide (ZEPBOUND) 15 MG/0.5ML Pen Inject 15 mg into the skin every 7 (seven) days. 2 mL 0   No facility-administered medications prior to visit.    No Known Allergies  Review of Systems  Constitutional:  Negative for fever and malaise/fatigue.  HENT:  Positive for congestion, ear pain, sinus pain (sinus pressure in face, behind eyes) and sore throat.   Eyes:  Negative for blurred vision.  Respiratory:  Positive for cough (productive cough). Negative for shortness of breath.   Cardiovascular:  Negative for chest pain, palpitations and leg swelling.  Gastrointestinal:  Negative for abdominal pain, blood in stool and nausea.  Genitourinary:  Negative for dysuria and frequency.  Musculoskeletal:  Negative for falls.  Skin:  Negative for rash.  Neurological:  Negative for dizziness, loss of consciousness and headaches.  Endo/Heme/Allergies:  Negative for environmental allergies.  Psychiatric/Behavioral:  Negative for depression. The patient is not nervous/anxious.        Objective:    Physical Exam Vitals and nursing note reviewed.  Constitutional:      General: She is not in acute distress.    Appearance: Normal appearance. She is not ill-appearing.   HENT:     Head: Normocephalic and atraumatic.     Right Ear: External ear normal.     Left Ear: External ear normal.     Nose: Congestion present.     Right Turbinates: Swollen.     Left Turbinates: Swollen.     Right Sinus: Maxillary sinus tenderness and frontal sinus tenderness present.     Left Sinus: Maxillary sinus tenderness and frontal sinus tenderness present.  Eyes:     Extraocular Movements: Extraocular movements intact.     Pupils: Pupils are equal, round, and reactive to light.  Cardiovascular:     Rate and Rhythm: Normal rate and regular rhythm.     Heart sounds: Normal heart sounds. No murmur heard.    No gallop.  Pulmonary:     Effort: Pulmonary effort is normal. No respiratory distress.     Breath sounds: Normal breath sounds. No wheezing or rales.  Skin:    General: Skin is warm and dry.  Neurological:     General: No focal deficit present.     Mental Status: She is alert and oriented to person, place, and time.  Psychiatric:        Mood and Affect: Mood normal.        Behavior: Behavior normal.        Thought Content: Thought content normal.        Judgment: Judgment normal.     BP 100/80 (BP Location: Left Arm, Patient Position: Sitting, Cuff Size: Normal)   Pulse 85   Temp 98.8 F (37.1 C) (Oral)   Resp 16   Ht 5\' 3"  (1.6 m)   Wt 141 lb 3.2 oz (64 kg)   LMP 04/03/2018   SpO2 98%   BMI 25.01 kg/m  Wt Readings from Last 3 Encounters:  09/16/22 141 lb 3.2 oz (  64 kg)  08/03/22 144 lb 6.4 oz (65.5 kg)  03/18/22 152 lb 1.9 oz (69 kg)       Assessment & Plan:  Acute non-recurrent pansinusitis -     Amoxicillin-Pot Clavulanate; Take 1 tablet by mouth 2 (two) times daily.  Dispense: 20 tablet; Refill: 0 -     Fluticasone Propionate; Place 2 sprays into both nostrils daily.  Dispense: 16 g; Refill: 6 -     Azelastine HCl; Place 1 spray into both nostrils 2 (two) times daily. Use in each nostril as directed  Dispense: 30 mL; Refill: 12  Return to  office as needed   I, Donato Schultz, DO, personally preformed the services described in this documentation.  All medical record entries made by the scribe were at my direction and in my presence.  I have reviewed the chart and discharge instructions (if applicable) and agree that the record reflects my personal performance and is accurate and complete. 09/16/2022   I,Shehryar Baig,acting as a scribe for Donato Schultz, DO.,have documented all relevant documentation on the behalf of Donato Schultz, DO,as directed by  Donato Schultz, DO while in the presence of Donato Schultz, DO.   Donato Schultz, DO

## 2022-09-23 ENCOUNTER — Other Ambulatory Visit: Payer: Self-pay | Admitting: Psychiatry

## 2022-09-23 ENCOUNTER — Other Ambulatory Visit (HOSPITAL_BASED_OUTPATIENT_CLINIC_OR_DEPARTMENT_OTHER): Payer: Self-pay

## 2022-09-23 DIAGNOSIS — F5105 Insomnia due to other mental disorder: Secondary | ICD-10-CM

## 2022-09-23 DIAGNOSIS — F411 Generalized anxiety disorder: Secondary | ICD-10-CM

## 2022-09-23 DIAGNOSIS — F3342 Major depressive disorder, recurrent, in full remission: Secondary | ICD-10-CM

## 2022-09-23 DIAGNOSIS — F4001 Agoraphobia with panic disorder: Secondary | ICD-10-CM

## 2022-09-24 ENCOUNTER — Other Ambulatory Visit (HOSPITAL_BASED_OUTPATIENT_CLINIC_OR_DEPARTMENT_OTHER): Payer: Self-pay

## 2022-09-24 ENCOUNTER — Other Ambulatory Visit (HOSPITAL_COMMUNITY): Payer: Self-pay

## 2022-09-24 ENCOUNTER — Other Ambulatory Visit: Payer: Self-pay

## 2022-09-24 ENCOUNTER — Other Ambulatory Visit: Payer: Self-pay | Admitting: Psychiatry

## 2022-09-24 DIAGNOSIS — F3342 Major depressive disorder, recurrent, in full remission: Secondary | ICD-10-CM

## 2022-09-24 DIAGNOSIS — F4001 Agoraphobia with panic disorder: Secondary | ICD-10-CM

## 2022-09-24 DIAGNOSIS — F411 Generalized anxiety disorder: Secondary | ICD-10-CM

## 2022-09-24 MED ORDER — PHENTERMINE HCL 37.5 MG PO TABS
18.7500 mg | ORAL_TABLET | Freq: Every day | ORAL | 0 refills | Status: DC
  Filled 2022-09-24: qty 30, 30d supply, fill #0

## 2022-09-24 MED ORDER — ALPRAZOLAM ER 0.5 MG PO TB24
1.5000 mg | ORAL_TABLET | Freq: Every day | ORAL | 0 refills | Status: DC
Start: 2022-09-24 — End: 2022-09-28
  Filled 2022-09-24: qty 90, 30d supply, fill #0

## 2022-09-24 MED ORDER — PAROXETINE HCL 40 MG PO TABS
80.0000 mg | ORAL_TABLET | Freq: Every day | ORAL | 0 refills | Status: DC
Start: 2022-09-24 — End: 2022-09-28
  Filled 2022-09-24: qty 60, 30d supply, fill #0

## 2022-09-24 NOTE — Telephone Encounter (Signed)
Needs keep appt next week

## 2022-09-28 ENCOUNTER — Ambulatory Visit (INDEPENDENT_AMBULATORY_CARE_PROVIDER_SITE_OTHER): Payer: 59 | Admitting: Psychiatry

## 2022-09-28 ENCOUNTER — Encounter: Payer: Self-pay | Admitting: Psychiatry

## 2022-09-28 ENCOUNTER — Other Ambulatory Visit (HOSPITAL_BASED_OUTPATIENT_CLINIC_OR_DEPARTMENT_OTHER): Payer: Self-pay

## 2022-09-28 DIAGNOSIS — F3342 Major depressive disorder, recurrent, in full remission: Secondary | ICD-10-CM

## 2022-09-28 DIAGNOSIS — F411 Generalized anxiety disorder: Secondary | ICD-10-CM

## 2022-09-28 DIAGNOSIS — E559 Vitamin D deficiency, unspecified: Secondary | ICD-10-CM | POA: Diagnosis not present

## 2022-09-28 DIAGNOSIS — F4001 Agoraphobia with panic disorder: Secondary | ICD-10-CM

## 2022-09-28 DIAGNOSIS — F5105 Insomnia due to other mental disorder: Secondary | ICD-10-CM

## 2022-09-28 MED ORDER — PROPRANOLOL HCL 20 MG PO TABS
20.0000 mg | ORAL_TABLET | Freq: Every day | ORAL | 0 refills | Status: DC | PRN
Start: 2022-09-28 — End: 2022-12-20
  Filled 2022-09-28: qty 180, 90d supply, fill #0

## 2022-09-28 MED ORDER — TRAZODONE HCL 50 MG PO TABS
100.0000 mg | ORAL_TABLET | Freq: Every evening | ORAL | 1 refills | Status: DC
Start: 2022-09-28 — End: 2023-04-04
  Filled 2022-09-28: qty 180, 90d supply, fill #0
  Filled 2022-12-20: qty 180, 90d supply, fill #1

## 2022-09-28 MED ORDER — ZOLPIDEM TARTRATE 10 MG PO TABS
10.0000 mg | ORAL_TABLET | Freq: Every evening | ORAL | 1 refills | Status: DC | PRN
Start: 2022-09-28 — End: 2023-04-04
  Filled 2022-09-28 – 2022-12-20 (×2): qty 90, 90d supply, fill #0

## 2022-09-28 MED ORDER — ALPRAZOLAM ER 0.5 MG PO TB24
1.5000 mg | ORAL_TABLET | Freq: Every day | ORAL | 1 refills | Status: DC
Start: 2022-09-28 — End: 2023-11-04
  Filled 2022-09-28: qty 270, 90d supply, fill #0
  Filled 2022-12-20: qty 270, 90d supply, fill #1

## 2022-09-28 MED ORDER — ARIPIPRAZOLE 10 MG PO TABS
10.0000 mg | ORAL_TABLET | Freq: Every day | ORAL | 1 refills | Status: DC
Start: 2022-09-28 — End: 2023-06-03
  Filled 2022-09-28 – 2022-10-18 (×2): qty 90, 90d supply, fill #0
  Filled 2022-12-20 – 2023-01-15 (×2): qty 90, 90d supply, fill #1

## 2022-09-28 MED ORDER — TOPIRAMATE 50 MG PO TABS
100.0000 mg | ORAL_TABLET | Freq: Two times a day (BID) | ORAL | 1 refills | Status: DC
Start: 2022-09-28 — End: 2023-04-04
  Filled 2022-09-28: qty 360, 90d supply, fill #0
  Filled 2022-12-20: qty 360, 90d supply, fill #1

## 2022-09-28 MED ORDER — PAROXETINE HCL 40 MG PO TABS
80.0000 mg | ORAL_TABLET | Freq: Every day | ORAL | 0 refills | Status: DC
Start: 2022-09-28 — End: 2022-12-20
  Filled 2022-09-28 – 2022-10-18 (×2): qty 180, 90d supply, fill #0

## 2022-09-28 NOTE — Progress Notes (Signed)
Brooke [name] [id number] 10-06-1970 52 y.o.  Subjective:   Patient ID:  Brooke [name] is a 52 y.o. (DOB 1970/07/08) female.  Chief Complaint:  Chief Complaint  Patient presents with   Follow-up   Depression   Anxiety   Sleeping Problem    HPI CHUA SAVOY presents to the office today for follow-up of anxiety and insomnia  seen December 2019.  For anxiety and weight gain topiramate was increased off label to 200 mg daily.  For anxiety propranolol 20 to 40 mg twice daily was added as needed.    seen November 2020: No meds were changed.  The following was noted: OK except for life events.  Still a lot of worry. Managed normally.  H agrees also.  Son's wife pregnant with another man's baby.  Real busy with 60 hours weekly.  Can't take Ambien at times bc of that.  Realizes she needs to take better self care.   Marland Kitchen August 23, 2019 appointment, the following is noted: Still doing well with meds.  Cont intermittent problems with work affecting sleep patterns and which sleep meds she can take.  Satisfied with meds.  No panic.  depression and anxiety manageable not gone.  Panic worse if overworked DT short staffed.  Patient reports stable mood and denies depressed or irritable moods.  Patient reports occasional difficulty with anxiety.  Patient reports occasionally difficulty with sleep initiation or maintenance. Denies appetite disturbance.  Patient reports that energy and motivation have been good.  Patient denies any difficulty with concentration.  Patient denies any suicidal ideation.  04/10/20 appt with following noted: Lost 33# at [facility] on 1000 calorie diet and Wegovy. A little extra anxiety lately without any specific reason for 2-3 mos.  No panic.  Mostly worry  Sx without avoidance.  Able to enjoy things normally.  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   Patient reports stable  mood and denies depressed or irritable moods.  Patient denies difficulty with sleep initiation or maintenance. Denies appetite disturbance.  Patient reports that energy and motivation have been good.  Patient denies any difficulty with concentration.  Patient denies any suicidal ideation. Plan: Continue alprazolam XR 0.5 mg every morning and 1 mg nightly increase aripiprazole 10 mg daily to see anxiety is better Continue paroxetine 80 mg daily Continue propranolol 20 mg tablets 1-2 twice daily as needed anxiety Continue topiramate 50 mg tablets 2 twice daily Continue trazodone 100 mg nightly Continue zolpidem 5 to 10 mg nightly as needed insomnia She typically will alternate trazodone or Ambien depending on her work schedule.  10/09/20 appt noted: Good.  Lost 50# pounds and feels a whole lot better. Using [facility] Potential shoulder surgery. No panic.  Anxiety has been controlled.  Not depressed.   Increase in Abilify helped that a lot. No SE. Still shift work.  Low vitamin D.   No concerns about meds and no reason for changes. Doing well. Plan: No med changes  04/09/2021 appointment with the following noted: Good normal stressors worse this time of year. Propranolol used prn is helpful for anxiety physically and tolerated. No SE with meds. Still She typically will alternate trazodone or Ambien depending on her work schedule. Sleep ok with meds mostly. Working dayshift OR, Clinical biochemist.  Long hours. Lost down to 137 from 190#.  Makes her feel better about herself.  On Wegovy.  Dr. Earlene Plater.  No desire for med changes Will get labs done. Plan: Continue alprazolam XR 0.5 mg every morning and 1 mg nightly Continue aripiprazole 10 mg daily  the increase helped anxiety is better Continue paroxetine 80 mg daily Continue propranolol 20 mg tablets 1-2 twice daily as needed anxiety helpful prn Continue topiramate 50 mg tablets 2 twice daily Continue trazodone 100 mg nightly Continue zolpidem 5 to  10 mg nightly as needed insomnia  10/08/21 appt noted: Good overall .  Marland KitchenNormal anxiety with stressors.  No panic. Patient reports stable mood and denies depressed or irritable moods.   Patient denies difficulty with sleep initiation or maintenance. Denies appetite disturbance.  Patient reports that energy and motivation have been good.  Patient denies any difficulty with concentration.  Patient denies any suicidal ideation. Dayshift generally. Propranolol helps  09/28/22 appt noted: Pretty good with a little added family anxiety.   Manageable. Sleep is ok.   52 yo [redacted] week pregnant.   She will be there to help with baby. No sig panic.  No sig avoidance. No SE .   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  No SE.  No excessive sleepiness with Xanax  and sometimes only 2 daily.  Past Psychiatric Medication Trials: Citalopram, fluoxetine, fluvoxamine, and Anafranil, paroxetine 80, duloxetine Abilify 10 Buspirone Topiramate Ambien, Ambien CR, trazodone Xanax XR  Review of Systems:  Review of Systems  Musculoskeletal:  Negative for arthralgias.       Pending shoulder surgery  Neurological:  Negative for dizziness, tremors and weakness.  Psychiatric/Behavioral:  Negative for agitation, behavioral problems, confusion, decreased concentration, dysphoric mood, hallucinations, self-injury and suicidal ideas. The patient is nervous/anxious. The patient is not hyperactive.     Medications: I have reviewed the patient's current medications.  Current Outpatient Medications  Medication Sig Dispense Refill   acetaminophen (TYLENOL) 500 MG tablet Take 1,000 mg by mouth every 6 (six) hours as needed for moderate pain.     alendronate (FOSAMAX) 70 MG tablet Take 1 tablet (70 mg total) by mouth every 7 (seven) days. Take with a full glass of water on an empty stomach. 12 tablet 3   AMBULATORY NON FORMULARY MEDICATION Medication Name:  Diltiazem 2% compound with Lidocaine 5% 30 g 0   amoxicillin-clavulanate (AUGMENTIN) 875-125 MG tablet Take 1 tablet by mouth 2 (two) times daily. 20 tablet 0   azelastine (ASTELIN) 0.1 % nasal spray Place 1 spray into both nostrils 2 (two) times daily. Use in each nostril as directed 30 mL 12   Calcium Carb-Cholecalciferol (CALCIUM 600+D) 600-800 MG-UNIT TABS Take 2 tablets by mouth daily.     celecoxib (CELEBREX) 100 MG capsule Take 1 capsule (100 mg total) by mouth 2 (two) times daily. 60 capsule 5   fluticasone (FLONASE) 50 MCG/ACT nasal spray Place 2 sprays into both nostrils daily. 16 g 6   ibuprofen (ADVIL) 200 MG tablet Take 800 mg by mouth every 6 (six) hours as needed for moderate pain or headache.     Melatonin 5 MG CHEW Chew 10 mg by mouth daily.     phentermine (ADIPEX-P) 37.5 MG tablet Take 0.5-1 tablets (18.75-37.5 mg total) by mouth daily. 30 tablet 0   polyethylene glycol (MIRALAX / GLYCOLAX) 17 g packet Take 17 g by mouth daily.     ALPRAZolam (ALPRAZOLAM XR) 0.5 MG 24 hr tablet 1 in the AM and 2 tablets at night 270 tablet 1   ARIPiprazole (  ABILIFY) 10 MG tablet Take 1 tablet (10 mg total) by mouth daily. 90 tablet 1   PARoxetine (PAXIL) 40 MG tablet Take 2 tablets (80 mg total) by mouth at bedtime. 180 tablet 0   propranolol (INDERAL) 20 MG tablet Take 1-2 tablets (20-40 mg total) by mouth daily as needed (anxiety). 180 tablet 0   tirzepatide (ZEPBOUND) 10 MG/0.5ML Pen Inject 10 mg into the skin once a week. (Patient not taking: Reported on 09/16/2022) 6 mL 0   tirzepatide (ZEPBOUND) 12.5 MG/0.5ML Pen Inject 12.5 mg into the skin every 7 (seven) days. (Patient not taking: Reported on 09/16/2022) 2 mL 0   topiramate (TOPAMAX) 50 MG tablet Take 2 tablets (100 mg total) by mouth 2 (two) times daily. 360 tablet 1   traZODone (DESYREL) 50 MG tablet Take 2 tablets (100 mg total) by mouth at bedtime. 180 tablet 1   zolpidem (AMBIEN) 10 MG tablet Take 1 tablet (10 mg total) by mouth at  bedtime as needed for sleep. 90 tablet 1   No current facility-administered medications for this visit.    Medication Side Effects: None  Allergies: No Known Allergies  Past Medical History:  Diagnosis Date   Anxiety    Arthritis    right shoulder   Back pain 12/13/2011   Bursitis    Constipation    Depression    Fatty liver    seen on [place] 04-2018   HLD (hyperlipidemia)    Hyperglycemia 03/05/2013   Hypotension    Incontinence    Lower extremity edema    Lumbar stress fracture    Obesity    Osteoporosis    SOB (shortness of breath)     Family History  Problem Relation Age of Onset   Colon polyps Mother    Thyroid disease Mother    Hyperlipidemia Mother    Diabetes Mother    Hypertension Mother    Diabetes Father    Hypertension Father    Hyperlipidemia Father    Mental illness Sister    Diabetes Paternal Aunt    Hypertension Paternal Uncle    Diabetes Paternal Grandmother    Hypertension Paternal Grandmother    Hyperlipidemia Paternal Grandmother    Colon cancer Neg Hx    Esophageal cancer Neg Hx    Rectal cancer Neg Hx    Stomach cancer Neg Hx     Social History   Socioeconomic History   Marital status: Married    Spouse name: John   Number of children: Not on file   Years of education: 16   Highest education level: Associate degree: academic program  Occupational History   Occupation: RN/short stay    Employer: [employer]  Tobacco Use   Smoking status: Never   Smokeless tobacco: Never  Vaping Use   Vaping Use: Never used  Substance and Sexual Activity   Alcohol use: No    Alcohol/week: 0.0 standard drinks of alcohol   Drug use: No   Sexual activity: Yes    Partners: Male    Birth control/protection: Surgical    Comment: Husband's vasectomy  Other Topics Concern   Not on file  Social History Narrative   Not on file   Social Determinants of Health   Financial Resource Strain: Low Risk  (08/03/2022)   Overall Financial Resource Strain  (CARDIA)    Difficulty of Paying Living Expenses: Not hard at all  Food Insecurity: Unknown (08/03/2022)   Hunger Vital Sign    Worried About Running Out  of Food in the Last Year: Patient declined    Ran Out of Food in the Last Year: Never true  Transportation Needs: No Transportation Needs (08/03/2022)   PRAPARE - Administrator, Civil Service (Medical): No    Lack of Transportation (Non-Medical): No  Physical Activity: Unknown (08/03/2022)   Exercise Vital Sign    Days of Exercise per Week: 0 days    Minutes of Exercise per Session: Not on file  Stress: Stress Concern Present (08/03/2022)   [employer] of Occupational Health - Occupational Stress Questionnaire    Feeling of Stress : Rather much  Social Connections: Socially Integrated (08/03/2022)   Social Connection and Isolation Panel [NHANES]    Frequency of Communication with Friends and Family: More than three times a week    Frequency of Social Gatherings with Friends and Family: Once a week    Attends Religious Services: More than 4 times per year    Active Member of [employer] or Organizations: Yes    Attends Engineer, structural: More than 4 times per year    Marital Status: Married  Catering manager Violence: Not on file    Past Medical History, Surgical history, Social history, and Family history were reviewed and updated as appropriate.   Please see review of systems for further details on the patient's review from today.   Objective:   Physical Exam:  LMP 04/03/2018   Physical Exam Constitutional:      General: She is not in acute distress.    Appearance: Normal appearance.  Musculoskeletal:        General: No deformity.  Neurological:     Mental Status: She is alert and oriented to person, place, and time.     Coordination: Coordination normal.     Gait: Gait normal.  Psychiatric:        Attention and Perception: She is attentive.        Mood and Affect: Mood is anxious. Mood is not  depressed. Affect is not labile, blunt, angry or inappropriate.        Speech: Speech normal.        Behavior: Behavior normal.        Thought Content: Thought content normal. Thought content does not include homicidal or suicidal ideation. Thought content does not include homicidal or suicidal plan.        Cognition and Memory: Cognition normal.        Judgment: Judgment normal.     Comments: Insight intact. No auditory or visual hallucinations. No delusions.  Anxiety manageable.     Lab Review:     Component Value Date/Time   NA 142 12/31/2021 1405   NA 144 08/06/2021 1022   K 3.9 12/31/2021 1405   CL 108 12/31/2021 1405   CO2 29 12/31/2021 1405   GLUCOSE 55 (L) 12/31/2021 1405   BUN 17 12/31/2021 1405   BUN 14 08/06/2021 1022   CREATININE 0.93 12/31/2021 1405   CREATININE 1.10 (H) 12/18/2021 1601   CALCIUM 9.2 12/31/2021 1405   PROT 6.2 12/31/2021 1405   PROT 6.1 08/06/2021 1022   ALBUMIN 4.1 12/31/2021 1405   ALBUMIN 4.3 08/06/2021 1022   AST 20 12/31/2021 1405   ALT 23 12/31/2021 1405   ALKPHOS 111 12/31/2021 1405   BILITOT 0.3 12/31/2021 1405   BILITOT 0.2 08/06/2021 1022   GFRNONAA >60 11/19/2020 0907   GFRAA 89 04/08/2020 0851       Component Value Date/Time   WBC  4.8 12/31/2021 1405   RBC 4.68 12/31/2021 1405   HGB 14.3 12/31/2021 1405   HGB 14.7 08/06/2021 1022   HCT 43.5 12/31/2021 1405   HCT 44.5 08/06/2021 1022   PLT 238.0 12/31/2021 1405   PLT 210 08/06/2021 1022   MCV 92.9 12/31/2021 1405   MCV 93 08/06/2021 1022   MCH 31.9 12/18/2021 1601   MCHC 33.0 12/31/2021 1405   RDW 13.1 12/31/2021 1405   RDW 12.7 08/06/2021 1022   LYMPHSABS 1.5 12/31/2021 1405   LYMPHSABS 1.2 08/06/2021 1022   MONOABS 0.3 12/31/2021 1405   EOSABS 0.1 12/31/2021 1405   EOSABS 0.0 08/06/2021 1022   BASOSABS 0.0 12/31/2021 1405   BASOSABS 0.0 08/06/2021 1022    No results found for: "POCLITH", "LITHIUM"   No results found for: "PHENYTOIN", "PHENOBARB", "VALPROATE",  "CBMZ"   .res Assessment: Plan:    Generalized anxiety disorder - Plan: ALPRAZolam (ALPRAZOLAM XR) 0.5 MG 24 hr tablet, PARoxetine (PAXIL) 40 MG tablet, propranolol (INDERAL) 20 MG tablet, topiramate (TOPAMAX) 50 MG tablet  Panic disorder with agoraphobia - Plan: ALPRAZolam (ALPRAZOLAM XR) 0.5 MG 24 hr tablet, PARoxetine (PAXIL) 40 MG tablet, propranolol (INDERAL) 20 MG tablet  Major depression, recurrent, full remission (HCC) - Plan: ARIPiprazole (ABILIFY) 10 MG tablet, PARoxetine (PAXIL) 40 MG tablet  Insomnia due to mental condition - Plan: traZODone (DESYREL) 50 MG tablet, zolpidem (AMBIEN) 10 MG tablet  Vitamin D deficiency   Overall med combo has worked well for depression and anxiety but is better than last time...  Disc options for benefit more: increase Abilify, topiramate off label, prn propranolol. Disc SE in detail of each option.   Doing ok without panic with less Xanax XR for now.  Supportive therapy dealing with D with psych px who is dependent on the familyand having another baby.  Discussed potential metabolic side effects associated with atypical antipsychotics, as well as potential risk for movement side effects. Advised pt to contact office if movement side effects occur.  No AIM  We discussed the short-term risks associated with benzodiazepines including sedation and increased fall risk among others.  Discussed long-term side effect risk including dependence, potential withdrawal symptoms, and the potential eventual dose-related risk of dementia.  Newer studies published 2020 dispute risk of dementia with benzodiazepines.  And disc risk of Ambien amnesia.  Consider switch to Ativan from Xanax to reduce the sedation from Bz.    Better self care and more sleep would help. Protect SLEEP.  Disc borderline low vitamin D levels and psych literature and immune studies suggesting goal of mid range vit d levels.  continue topiramate bc help weight gain risk to 200mg  daily.   Disc SE.  Rx prn propranolol 20-40 mg BID for anxiety.   Disc polypharmacy is medcially necessary and has been helpful.  It has taken a lot of meds and combination of mechanisms in order to manage her depression and anxiety symptoms.  She tolerates the higher dose of paroxetine without side effects.  Panic was not managed with less paroxetine. Disc purpose of each med.  Disc SE in detail and SSRI withdrawal sx. Discussed serotonin syndrome.  Avoid dextromethorphan, tramadol which can increase risk. Continue alprazolam XR 0.5 mg every morning and 1 mg nightly Continue aripiprazole 10 mg daily  the increase helped anxiety is better Continue paroxetine 80 mg daily Continue propranolol 20 mg tablets 1-2 twice daily as needed anxiety Continue topiramate 50 mg tablets 2 twice daily Continue trazodone 100 mg nightly Continue zolpidem  5 to 10 mg nightly as needed insomnia She typically will alternate trazodone or Ambien depending on her work schedule. No med changes today  This appt was 30 mins.  FU 6 mos  Meredith Staggers, MD, DFAPA     Please see After Visit Summary for patient specific instructions.  No future appointments.   No orders of the defined types were placed in this encounter.     -------------------------------

## 2022-09-29 ENCOUNTER — Other Ambulatory Visit (HOSPITAL_BASED_OUTPATIENT_CLINIC_OR_DEPARTMENT_OTHER): Payer: Self-pay

## 2022-10-06 ENCOUNTER — Other Ambulatory Visit: Payer: Self-pay | Admitting: Family Medicine

## 2022-10-06 DIAGNOSIS — E785 Hyperlipidemia, unspecified: Secondary | ICD-10-CM

## 2022-10-07 ENCOUNTER — Other Ambulatory Visit (HOSPITAL_BASED_OUTPATIENT_CLINIC_OR_DEPARTMENT_OTHER): Payer: Self-pay

## 2022-10-07 MED ORDER — PHENTERMINE HCL 37.5 MG PO TABS
18.7500 mg | ORAL_TABLET | Freq: Every day | ORAL | 0 refills | Status: DC
  Filled 2022-10-07 – 2022-11-18 (×2): qty 30, 30d supply, fill #0

## 2022-10-18 ENCOUNTER — Other Ambulatory Visit (HOSPITAL_BASED_OUTPATIENT_CLINIC_OR_DEPARTMENT_OTHER): Payer: Self-pay

## 2022-10-22 ENCOUNTER — Other Ambulatory Visit (HOSPITAL_BASED_OUTPATIENT_CLINIC_OR_DEPARTMENT_OTHER): Payer: Self-pay

## 2022-11-17 ENCOUNTER — Other Ambulatory Visit: Payer: 59

## 2022-11-18 ENCOUNTER — Other Ambulatory Visit: Payer: Self-pay

## 2022-11-18 ENCOUNTER — Other Ambulatory Visit (HOSPITAL_BASED_OUTPATIENT_CLINIC_OR_DEPARTMENT_OTHER): Payer: Self-pay

## 2022-12-06 ENCOUNTER — Ambulatory Visit (HOSPITAL_COMMUNITY)
Admission: RE | Admit: 2022-12-06 | Discharge: 2022-12-06 | Disposition: A | Discharge status: Home / Self Care | Payer: 59 | Source: Ambulatory Visit | Attending: Family Medicine | Admitting: Family Medicine

## 2022-12-06 DIAGNOSIS — E785 Hyperlipidemia, unspecified: Secondary | ICD-10-CM | POA: Insufficient documentation

## 2022-12-07 ENCOUNTER — Other Ambulatory Visit (HOSPITAL_BASED_OUTPATIENT_CLINIC_OR_DEPARTMENT_OTHER): Payer: Self-pay

## 2022-12-07 MED ORDER — ZEPBOUND 15 MG/0.5ML ~~LOC~~ SOAJ
15.0000 mg | SUBCUTANEOUS | 1 refills | Status: DC
  Filled 2022-12-07: qty 2, 28d supply, fill #0
  Filled 2023-05-31: qty 2, 28d supply, fill #1

## 2022-12-09 ENCOUNTER — Other Ambulatory Visit (HOSPITAL_BASED_OUTPATIENT_CLINIC_OR_DEPARTMENT_OTHER): Payer: Self-pay

## 2022-12-10 ENCOUNTER — Other Ambulatory Visit: Payer: 59

## 2022-12-19 ENCOUNTER — Encounter: Payer: Self-pay | Admitting: Family Medicine

## 2022-12-20 ENCOUNTER — Other Ambulatory Visit (HOSPITAL_BASED_OUTPATIENT_CLINIC_OR_DEPARTMENT_OTHER): Payer: Self-pay

## 2022-12-20 ENCOUNTER — Other Ambulatory Visit: Payer: Self-pay | Admitting: Psychiatry

## 2022-12-20 ENCOUNTER — Other Ambulatory Visit: Payer: Self-pay

## 2022-12-20 ENCOUNTER — Other Ambulatory Visit: Payer: Self-pay | Admitting: Family Medicine

## 2022-12-20 DIAGNOSIS — F4001 Agoraphobia with panic disorder: Secondary | ICD-10-CM

## 2022-12-20 DIAGNOSIS — F3342 Major depressive disorder, recurrent, in full remission: Secondary | ICD-10-CM

## 2022-12-20 DIAGNOSIS — F411 Generalized anxiety disorder: Secondary | ICD-10-CM

## 2022-12-20 DIAGNOSIS — M25552 Pain in left hip: Secondary | ICD-10-CM

## 2022-12-20 MED ORDER — PROPRANOLOL HCL 20 MG PO TABS
20.0000 mg | ORAL_TABLET | Freq: Every day | ORAL | 0 refills | Status: DC | PRN
Start: 2022-12-20 — End: 2023-04-04
  Filled 2022-12-20: qty 180, 90d supply, fill #0

## 2022-12-20 MED ORDER — PAROXETINE HCL 40 MG PO TABS
80.0000 mg | ORAL_TABLET | Freq: Every day | ORAL | 0 refills | Status: DC
Start: 2022-12-20 — End: 2023-04-04
  Filled 2022-12-20 – 2023-01-17 (×2): qty 180, 90d supply, fill #0

## 2022-12-21 ENCOUNTER — Other Ambulatory Visit (HOSPITAL_BASED_OUTPATIENT_CLINIC_OR_DEPARTMENT_OTHER): Payer: Self-pay

## 2022-12-21 DIAGNOSIS — M7062 Trochanteric bursitis, left hip: Secondary | ICD-10-CM | POA: Diagnosis not present

## 2022-12-21 MED ORDER — MELOXICAM 15 MG PO TABS
15.0000 mg | ORAL_TABLET | Freq: Every day | ORAL | 3 refills | Status: DC
  Filled 2022-12-21: qty 30, 30d supply, fill #0
  Filled 2023-01-15: qty 30, 30d supply, fill #1
  Filled 2023-02-10: qty 30, 30d supply, fill #2
  Filled 2023-04-04: qty 30, 30d supply, fill #3

## 2022-12-23 ENCOUNTER — Ambulatory Visit: Payer: 59 | Admitting: Family Medicine

## 2023-01-13 ENCOUNTER — Other Ambulatory Visit: Payer: Self-pay

## 2023-01-13 ENCOUNTER — Ambulatory Visit: Payer: 59 | Attending: Orthopedic Surgery

## 2023-01-13 DIAGNOSIS — M6281 Muscle weakness (generalized): Secondary | ICD-10-CM | POA: Insufficient documentation

## 2023-01-13 DIAGNOSIS — M25552 Pain in left hip: Secondary | ICD-10-CM | POA: Insufficient documentation

## 2023-01-13 NOTE — Therapy (Signed)
OUTPATIENT PHYSICAL THERAPY LOWER EXTREMITY EVALUATION   Patient Name: Brooke [name] MRN: [id number] DOB:May 03, 1971, 52 y.o., female Today's Date: 01/13/2023  END OF SESSION:  PT End of Session - 01/13/23 1747     Visit Number 1    PT Start Time 1747             Past Medical History:  Diagnosis Date   Anxiety    Arthritis    right shoulder   Back pain 12/13/2011   Bursitis    Constipation    Depression    Fatty liver    seen on [place] 04-2018   HLD (hyperlipidemia)    Hyperglycemia 03/05/2013   Hypotension    Incontinence    Lower extremity edema    Lumbar stress fracture    Obesity    Osteoporosis    SOB (shortness of breath)    Past Surgical History:  Procedure Laterality Date   BREAST BIOPSY Right 12/27/2018   NEG   COLONOSCOPY     EXCISION/RELEASE BURSA HIP Right 04/11/2019   Procedure: Right hip bursectomy;  Surgeon: Ranee Gosselin, MD;  Location: [facility];  Service: Orthopedics;  Laterality: Right;    RHINOPLASTY     SINUS ENDO W/FUSION     TONSILLECTOMY     Patient Active Problem List   Diagnosis Date Noted   RUQ abdominal pain 12/18/2021   Vitamin D deficiency 06/04/2020   Mixed hyperlipidemia 06/04/2020   Polyphagia, controlled with [facility] 06/04/2020   Chronic low blood pressure 06/04/2020   Greater trochanteric bursitis of right hip 04/11/2019   GAD (generalized anxiety disorder) 04/17/2018   Panic disorder with agoraphobia 04/17/2018   Recurrent UTI 02/06/2014   Osteoporosis, unspecified 01/10/2014   Urinary frequency 09/04/2012   Sleep disturbance 12/13/2011   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 She states that Dr. Linna Caprice gave her meloxicam and wanted to her to do PT to strengthen her hip. She noticed the pain most at the end of the day and if she slept on her left side. She was also given an injection at last visit with referring provider.   PERTINENT HISTORY: PMHx includes anxiety, depression, hyperglycemia, hypotension, incontinence, lumbar stress fx, osteoporosis, anxiety, panic disorder with agoraphobia, vitamin D deficiency  PAIN:  Are you having pain? Yes: NPRS scale: 0-10/10 since injection and meloxicam  Pain location: L hip  Pain description: throbbing, aching, spasm  Aggravating factors: laying on her Lt hip  Relieving factors: medication/injection  PRECAUTIONS: None  RED FLAGS: None   WEIGHT BEARING RESTRICTIONS: No  FALLS:  Has patient fallen in last 6 months? No  LIVING ENVIRONMENT: Lives with: lives with their family Lives in: House/apartment Stairs: Yes: Internal: 14 steps; on left going up Has following equipment at home: None  OCCUPATION: RN in radiology - prolonged standing, pushing w/c   PLOF: Independent  PATIENT GOALS: Patient would like to have less pain, yard work, returning to running.   NEXT MD VISIT: not scheduled at this time.   OBJECTIVE:   DIAGNOSTIC FINDINGS:   Last available: 08/05/22 RT DG  Hip Unilat w or w/o pelvis  IMPRESSION: 1. No acute osseous abnormality of the right hip. 2. 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  PATIENT SURVEYS:  FOTO 61 current, 71 predicted.   COGNITION: Overall cognitive status: Within functional limits for tasks assessed     SENSATION: Not tested  PALPATION: Moderate tenderness to palpation along BIL hip abductor mm, with palpable myofascial trigger points L>R from mid-to-proximal lateral thigh  LOWER EXTREMITY ROM & SPECIAL  TESTS:  Lt hamstring tightness <45 degrees PROM, Lt fadir negative but pt noted muscle tension    LOWER EXTREMITY MMT:  MMT Right eval Left eval  Hip flexion    Hip extension    Hip abduction 4- 3+  Hip adduction 4 4-  Hip internal rotation    Hip external rotation    Knee flexion    Knee extension    Ankle dorsiflexion    Ankle plantarflexion    Ankle inversion    Ankle eversion     (Blank rows = not tested)   GAIT: Distance walked: 50 ft Assistive device utilized: None Level of assistance: Complete Independence Comments: mild antalgic deviations including slower gait speed, wide BOS   TODAY'S TREATMENT:                                                                                                                               [facility] Adult PT Treatment:                                                DATE: 01/15/2023   Initial evaluation: see patient education and home exercise program as noted below     PATIENT EDUCATION:  Education details: reviewed initial home exercise program; discussion of POC, prognosis and goals for skilled PT   Person educated: Patient Education method: Explanation, Demonstration, and Handouts Education comprehension: needs further education  HOME EXERCISE PROGRAM: Access Code: [email or web address] URL: [email or web address] Date: 01/15/2023 Prepared by: Mauri Reading  Exercises - Supine ITB Stretch with Strap  - 1 x daily - 7 x weekly - 2-3 sets - 20-30 sec hold - Supine Bridge with Resistance Band  - 1 x daily - 7 x weekly - 2 sets - 10 reps  ASSESSMENT:  CLINICAL IMPRESSION: Patient is a 52 y.o. female  who was seen today for physical therapy evaluation and treatment for L hip pain with strength deficits. She is demonstrating decreased Lt hip strength, decreased hamstring mm flexibility and moderate-to-severe tenderness to palpation. She has related pain and difficulty with prolonged walking, squatting, running and performance of  normal housework/yardwork activities. She requires skilled PT services at this time to address relevant deficits and improve overall function.     OBJECTIVE IMPAIRMENTS: decreased activity tolerance, decreased ROM,  decreased strength, and pain.   ACTIVITY LIMITATIONS: bending, squatting, stairs, locomotion level, and caring for others  PARTICIPATION LIMITATIONS: cleaning, community activity, occupation, yard work, and recreational activity  PERSONAL FACTORS: Past/current experiences, Profession, Time since onset of injury/illness/exacerbation, and 3+ comorbidities: PMHx includes anxiety, depression, hyperglycemia, hypotension, incontinence, lumbar stress fx, osteoporosis, anxiety, panic disorder with agoraphobia, vitamin D deficiency  are also affecting patient's functional outcome.   REHAB POTENTIAL: Fair    CLINICAL DECISION MAKING: Stable/uncomplicated  EVALUATION COMPLEXITY: Low   GOALS: Goals reviewed with patient? Yes  SHORT TERM GOALS: Target date: 02/12/2023    Patient will be independent with initial home program for hip abductor mm deficits.  Baseline: provided at eval  Goal status: INITIAL   LONG TERM GOALS: Target date: 03/10/2023   Patient will report improved overall functional ability with FOTO score of 70 or greater.   Baseline:  61 Goal status: INITIAL  2.  Patient will demonstrate at least improved BIL hip strength to 4+/5 MMT or greater.  Baseline: see objective measures  Goal status: INITIAL  3.  Patient will report ability to perform all normal work-related tasks without exacerbation of L hip pain.  Baseline: somewhat limited by pain  Goal status: INITIAL  4.  Patient will demonstrate minimal-to-no tenderness with palpation along lateral thigh, including hip abductor mm.  Baseline: Moderate tenderness to palpation along BIL hip abductor mm, with palpable myofascial trigger points L>R from mid-to-proximal lateral thigh Goal status: INITIAL  5.   Patient will report ability to return to running with minimal-to-no pain.  Baseline: unable  Goal status: INITIAL    PLAN:  PT FREQUENCY: 1-2x/week  PT DURATION: 8 weeks  PLANNED INTERVENTIONS: Therapeutic exercises, Therapeutic activity, Neuromuscular re-education, Patient/Family education, Self Care, Joint mobilization, Dry Needling, Electrical stimulation, Cryotherapy, Moist heat, Taping, Manual therapy, and Re-evaluation  PLAN FOR NEXT SESSION: manual therapy as indicated, hamstring stretch, provide initial HEP, Nustep for [place], LE strengthening        Mauri Reading, PT, DPT 01/13/2023, 5:47 PM

## 2023-01-17 ENCOUNTER — Other Ambulatory Visit (HOSPITAL_BASED_OUTPATIENT_CLINIC_OR_DEPARTMENT_OTHER): Payer: Self-pay

## 2023-01-18 ENCOUNTER — Ambulatory Visit: Payer: 59

## 2023-02-01 ENCOUNTER — Telehealth: Payer: Self-pay

## 2023-02-01 ENCOUNTER — Ambulatory Visit: Payer: 59 | Attending: Family Medicine

## 2023-02-01 NOTE — Telephone Encounter (Signed)
Attempted to call regarding missed appointment, unable to leave a message due to full mail box.  1st no-show  Berta Minor, [place] 02/01/23 6:06 PM

## 2023-02-08 ENCOUNTER — Ambulatory Visit: Payer: 59 | Admitting: Physical Therapy

## 2023-03-15 ENCOUNTER — Other Ambulatory Visit (HOSPITAL_BASED_OUTPATIENT_CLINIC_OR_DEPARTMENT_OTHER): Payer: Self-pay

## 2023-03-15 DIAGNOSIS — E782 Mixed hyperlipidemia: Secondary | ICD-10-CM | POA: Diagnosis not present

## 2023-03-15 DIAGNOSIS — E559 Vitamin D deficiency, unspecified: Secondary | ICD-10-CM | POA: Diagnosis not present

## 2023-03-15 DIAGNOSIS — Z79899 Other long term (current) drug therapy: Secondary | ICD-10-CM | POA: Diagnosis not present

## 2023-03-15 MED ORDER — PHENTERMINE HCL 37.5 MG PO TABS
ORAL_TABLET | ORAL | 0 refills | Status: DC
  Filled 2023-03-15: qty 45, 30d supply, fill #0

## 2023-04-04 ENCOUNTER — Encounter: Payer: Self-pay | Admitting: Psychiatry

## 2023-04-04 ENCOUNTER — Ambulatory Visit: Payer: 59 | Admitting: Psychiatry

## 2023-04-04 DIAGNOSIS — F5105 Insomnia due to other mental disorder: Secondary | ICD-10-CM | POA: Diagnosis not present

## 2023-04-04 DIAGNOSIS — G4726 Circadian rhythm sleep disorder, shift work type: Secondary | ICD-10-CM

## 2023-04-04 DIAGNOSIS — F4001 Agoraphobia with panic disorder: Secondary | ICD-10-CM | POA: Diagnosis not present

## 2023-04-04 DIAGNOSIS — F411 Generalized anxiety disorder: Secondary | ICD-10-CM

## 2023-04-04 DIAGNOSIS — E559 Vitamin D deficiency, unspecified: Secondary | ICD-10-CM | POA: Diagnosis not present

## 2023-04-04 DIAGNOSIS — F3342 Major depressive disorder, recurrent, in full remission: Secondary | ICD-10-CM

## 2023-04-04 MED ORDER — TRAZODONE HCL 50 MG PO TABS
100.0000 mg | ORAL_TABLET | Freq: Every evening | ORAL | 1 refills | Status: DC
Start: 2023-04-04 — End: 2023-09-01
  Filled 2023-04-04: qty 180, 90d supply, fill #0
  Filled 2023-06-30: qty 180, 90d supply, fill #1

## 2023-04-04 MED ORDER — PROPRANOLOL HCL 20 MG PO TABS
20.0000 mg | ORAL_TABLET | Freq: Every day | ORAL | 0 refills | Status: DC | PRN
Start: 2023-04-04 — End: 2023-05-31
  Filled 2023-04-04: qty 180, 90d supply, fill #0

## 2023-04-04 MED ORDER — TOPIRAMATE 50 MG PO TABS
100.0000 mg | ORAL_TABLET | Freq: Two times a day (BID) | ORAL | 1 refills | Status: DC
Start: 2023-04-04 — End: 2023-09-27
  Filled 2023-04-04: qty 360, 90d supply, fill #0
  Filled 2023-06-30: qty 360, 90d supply, fill #1

## 2023-04-04 MED ORDER — ZOLPIDEM TARTRATE 10 MG PO TABS
10.0000 mg | ORAL_TABLET | Freq: Every evening | ORAL | 1 refills | Status: DC | PRN
Start: 2023-04-04 — End: 2023-11-04
  Filled 2023-04-04: qty 90, 90d supply, fill #0
  Filled 2023-09-01: qty 90, 90d supply, fill #1

## 2023-04-04 MED ORDER — PAROXETINE HCL 40 MG PO TABS
80.0000 mg | ORAL_TABLET | Freq: Every day | ORAL | 0 refills | Status: DC
Start: 2023-04-04 — End: 2023-07-11
  Filled 2023-04-04: qty 180, 90d supply, fill #0

## 2023-04-04 NOTE — Patient Instructions (Signed)
Option Vyvanse with lower anxiety risk than Adderall.   Start low dose to lower anxiety risk

## 2023-04-04 NOTE — Progress Notes (Signed)
Brooke [name] [id number] April 11, 1971 52 y.o.  Subjective:   Patient ID:  Brooke [name] is a 52 y.o. (DOB 11/14/1970) female.  Chief Complaint:  Chief Complaint  Patient presents with   Follow-up   Depression   Anxiety    HPI CHANCI COPE presents to the office today for follow-up of anxiety and insomnia  seen December 2019.  For anxiety and weight gain topiramate was increased off label to 200 mg daily.  For anxiety propranolol 20 to 40 mg twice daily was added as needed.    seen November 2020: No meds were changed.  The following was noted: OK except for life events.  Still a lot of worry. Managed normally.  H agrees also.  Son's wife pregnant with another man's baby.  Real busy with 60 hours weekly.  Can't take Ambien at times bc of that.  Realizes she needs to take better self care.   Marland Kitchen August 23, 2019 appointment, the following is noted: Still doing well with meds.  Cont intermittent problems with work affecting sleep patterns and which sleep meds she can take.  Satisfied with meds.  No panic.  depression and anxiety manageable not gone.  Panic worse if overworked DT short staffed.  Patient reports stable mood and denies depressed or irritable moods.  Patient reports occasional difficulty with anxiety.  Patient reports occasionally difficulty with sleep initiation or maintenance. Denies appetite disturbance.  Patient reports that energy and motivation have been good.  Patient denies any difficulty with concentration.  Patient denies any suicidal ideation.  04/10/20 appt with following noted: Lost 33# at [facility] on 1000 calorie diet and Wegovy. A little extra anxiety lately without any specific reason for 2-3 mos.  No panic.  Mostly worry  Sx without avoidance.  Able to enjoy things normally.  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   Patient reports stable mood and denies  depressed or irritable moods.  Patient denies difficulty with sleep initiation or maintenance. Denies appetite disturbance.  Patient reports that energy and motivation have been good.  Patient denies any difficulty with concentration.  Patient denies any suicidal ideation. Plan: Continue alprazolam XR 0.5 mg every morning and 1 mg nightly increase aripiprazole 10 mg daily to see anxiety is better Continue paroxetine 80 mg daily Continue propranolol 20 mg tablets 1-2 twice daily as needed anxiety Continue topiramate 50 mg tablets 2 twice daily Continue trazodone 100 mg nightly Continue zolpidem 5 to 10 mg nightly as needed insomnia She typically will alternate trazodone or Ambien depending on her work schedule.  10/09/20 appt noted: Good.  Lost 50# pounds and feels a whole lot better. Using [facility] Potential shoulder surgery. No panic.  Anxiety has been controlled.  Not depressed.   Increase in Abilify helped that a lot. No SE. Still shift work.  Low vitamin D.   No concerns about meds and no reason for changes. Doing well. Plan: No med changes  04/09/2021 appointment with the following noted: Good normal stressors worse this time of year. Propranolol used prn is helpful for anxiety physically and tolerated. No SE with meds. Still She typically will alternate trazodone or Ambien depending on her work schedule. Sleep ok with meds mostly. Working dayshift OR, Clinical biochemist.  Long hours. Lost down to 137 from 190#.  Makes her feel better about herself.  On Wegovy.  Dr. Earlene Plater. No desire for med  changes Will get labs done. Plan: Continue alprazolam XR 0.5 mg every morning and 1 mg nightly Continue aripiprazole 10 mg daily  the increase helped anxiety is better Continue paroxetine 80 mg daily Continue propranolol 20 mg tablets 1-2 twice daily as needed anxiety helpful prn Continue topiramate 50 mg tablets 2 twice daily Continue trazodone 100 mg nightly Continue zolpidem 5 to 10 mg nightly  as needed insomnia  10/08/21 appt noted: Good overall .  Marland KitchenNormal anxiety with stressors.  No panic. Patient reports stable mood and denies depressed or irritable moods.   Patient denies difficulty with sleep initiation or maintenance. Denies appetite disturbance.  Patient reports that energy and motivation have been good.  Patient denies any difficulty with concentration.  Patient denies any suicidal ideation. Dayshift generally. Propranolol helps  09/28/22 appt noted: Pretty good with a little added family anxiety.   Manageable. Sleep is ok.   52 yo [redacted] week pregnant.   She will be there to help with baby. No sig panic.  No sig avoidance. No SE .   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  04/04/23 appt noted: Meds:  alprazolam XR 0.5 mg AM and 1 mg HS, paroxetine 80, topiramate 100 mg BID, trazodone 100 mg HS, Ambien 10 HS.  Abilify 10 Wt gain but  No panic except for phentermine and stopped it.   Does well with propranolol prn anxiety.   Interested in trying Vraylar instead of Abilify to see if can lose wt. Recognizes Paxil can cause wt gain but tried the most reasonable alternatives.   Patient reports stable mood and denies depressed or irritable moods.  Patient denies any recent difficulty with anxiety.  Patient denies difficulty with sleep initiation or maintenance. Denies appetite disturbance.  Patient reports that energy and motivation have been good.  Patient denies any difficulty with concentration.  Patient denies any suicidal ideation.   No SE.  No excessive sleepiness with Xanax  and sometimes only 2 daily.  Past Psychiatric Medication Trials: Citalopram, fluoxetine, fluvoxamine, and Anafranil, paroxetine 80, duloxetine Zoloft during pregnancy Abilify 10 Buspirone Topiramate Ambien, Ambien CR, trazodone Xanax XR Phentermine anxiety  Review of Systems:  Review of Systems  Musculoskeletal:  Negative for  arthralgias.       Pending shoulder surgery  Neurological:  Negative for dizziness and tremors.  Psychiatric/Behavioral:  Negative for agitation, behavioral problems, confusion, decreased concentration, dysphoric mood, hallucinations, self-injury and suicidal ideas. The patient is nervous/anxious. The patient is not hyperactive.     Medications: I have reviewed the patient's current medications.  Current Outpatient Medications  Medication Sig Dispense Refill   acetaminophen (TYLENOL) 500 MG tablet Take 1,000 mg by mouth every 6 (six) hours as needed for moderate pain.     alendronate (FOSAMAX) 70 MG tablet Take 1 tablet (70 mg total) by mouth every 7 (seven) days. Take with a full glass of water on an empty stomach. 12 tablet 3   ALPRAZolam (ALPRAZOLAM XR) 0.5 MG 24 hr tablet Take 1 tablet (0.5 mg total) by mouth in the morning and 2 tablets (1 mg total) by mouth at night. 270 tablet 1   AMBULATORY NON FORMULARY MEDICATION Medication Name: Diltiazem 2% compound with Lidocaine 5% 30 g 0   amoxicillin-clavulanate (AUGMENTIN) 875-125 MG tablet Take 1 tablet by mouth 2 (two) times daily. 20 tablet 0   ARIPiprazole (ABILIFY) 10 MG tablet Take 1 tablet (10 mg total) by mouth daily. 90  tablet 1   azelastine (ASTELIN) 0.1 % nasal spray Place 1 spray into both nostrils 2 (two) times daily. Use in each nostril as directed 30 mL 12   Calcium Carb-Cholecalciferol (CALCIUM 600+D) 600-800 MG-UNIT TABS Take 2 tablets by mouth daily.     celecoxib (CELEBREX) 100 MG capsule Take 1 capsule (100 mg total) by mouth 2 (two) times daily. 60 capsule 5   fluticasone (FLONASE) 50 MCG/ACT nasal spray Place 2 sprays into both nostrils daily. 16 g 6   ibuprofen (ADVIL) 200 MG tablet Take 800 mg by mouth every 6 (six) hours as needed for moderate pain or headache.     Melatonin 5 MG CHEW Chew 10 mg by mouth daily.     meloxicam (MOBIC) 15 MG tablet Take 1 tablet (15 mg total) by mouth daily with food. 30 tablet 3    polyethylene glycol (MIRALAX / GLYCOLAX) 17 g packet Take 17 g by mouth daily.     PARoxetine (PAXIL) 40 MG tablet Take 2 tablets (80 mg total) by mouth at bedtime. 180 tablet 0   phentermine (ADIPEX-P) 37.5 MG tablet Take 1 tablet (37.5 mg total) by mouth in the morning AND 0.5 tablets (18.75 mg total) daily at 12 noon. (Patient not taking: Reported on 04/04/2023) 45 tablet 0   propranolol (INDERAL) 20 MG tablet Take 1-2 tablets (20-40 mg total) by mouth daily as needed (anxiety). 180 tablet 0   tirzepatide (ZEPBOUND) 10 MG/0.5ML Pen Inject 10 mg into the skin once a week. (Patient not taking: Reported on 04/04/2023) 6 mL 0   tirzepatide (ZEPBOUND) 12.5 MG/0.5ML Pen Inject 12.5 mg into the skin every 7 (seven) days. (Patient not taking: Reported on 04/04/2023) 2 mL 0   tirzepatide (ZEPBOUND) 15 MG/0.5ML Pen Inject 15 mg into the skin once a week. (Patient not taking: Reported on 04/04/2023) 2 mL 1   topiramate (TOPAMAX) 50 MG tablet Take 2 tablets (100 mg total) by mouth 2 (two) times daily. 360 tablet 1   traZODone (DESYREL) 50 MG tablet Take 2 tablets (100 mg total) by mouth at bedtime. 180 tablet 1   zolpidem (AMBIEN) 10 MG tablet Take 1 tablet (10 mg total) by mouth at bedtime as needed for sleep. 90 tablet 1   No current facility-administered medications for this visit.    Medication Side Effects: None  Allergies: No Known Allergies  Past Medical History:  Diagnosis Date   Anxiety    Arthritis    right shoulder   Back pain 12/13/2011   Bursitis    Constipation    Depression    Fatty liver    seen on [place] 04-2018   HLD (hyperlipidemia)    Hyperglycemia 03/05/2013   Hypotension    Incontinence    Lower extremity edema    Lumbar stress fracture    Obesity    Osteoporosis    SOB (shortness of breath)     Family History  Problem Relation Age of Onset   Colon polyps Mother    Thyroid disease Mother    Hyperlipidemia Mother    Diabetes Mother    Hypertension Mother    Diabetes  Father    Hypertension Father    Hyperlipidemia Father    Mental illness Sister    Diabetes Paternal Aunt    Hypertension Paternal Uncle    Diabetes Paternal Grandmother    Hypertension Paternal Grandmother    Hyperlipidemia Paternal Grandmother    Colon cancer Neg Hx    Esophageal cancer Neg  Hx    Rectal cancer Neg Hx    Stomach cancer Neg Hx     Social History   Socioeconomic History   Marital status: Married    Spouse name: John   Number of children: Not on file   Years of education: 16   Highest education level: Associate degree: academic program  Occupational History   Occupation: RN/short stay    Employer: [employer]  Tobacco Use   Smoking status: Never   Smokeless tobacco: Never  Vaping Use   Vaping status: Never Used  Substance and Sexual Activity   Alcohol use: No    Alcohol/week: 0.0 standard drinks of alcohol   Drug use: No   Sexual activity: Yes    Partners: Male    Birth control/protection: Surgical    Comment: Husband's vasectomy  Other Topics Concern   Not on file  Social History Narrative   Not on file   Social Determinants of Health   Financial Resource Strain: Low Risk  (08/03/2022)   Overall Financial Resource Strain (CARDIA)    Difficulty of Paying Living Expenses: Not hard at all  Food Insecurity: Unknown (08/03/2022)   Hunger Vital Sign    Worried About Running Out of Food in the Last Year: Patient declined    Ran Out of Food in the Last Year: Never true  Transportation Needs: No Transportation Needs (08/03/2022)   PRAPARE - Administrator, Civil Service (Medical): No    Lack of Transportation (Non-Medical): No  Physical Activity: Unknown (08/03/2022)   Exercise Vital Sign    Days of Exercise per Week: 0 days    Minutes of Exercise per Session: Not on file  Stress: Stress Concern Present (08/03/2022)   [employer] of Occupational Health - Occupational Stress Questionnaire    Feeling of Stress : Rather much  Social  Connections: Socially Integrated (08/03/2022)   Social Connection and Isolation Panel [NHANES]    Frequency of Communication with Friends and Family: More than three times a week    Frequency of Social Gatherings with Friends and Family: Once a week    Attends Religious Services: More than 4 times per year    Active Member of [employer] or Organizations: Yes    Attends Engineer, structural: More than 4 times per year    Marital Status: Married  Catering manager Violence: Not on file    Past Medical History, Surgical history, Social history, and Family history were reviewed and updated as appropriate.   Please see review of systems for further details on the patient's review from today.   Objective:   Physical Exam:  LMP 04/03/2018   Physical Exam Constitutional:      General: She is not in acute distress.    Appearance: Normal appearance.  Musculoskeletal:        General: No deformity.  Neurological:     Mental Status: She is alert and oriented to person, place, and time.     Coordination: Coordination normal.     Gait: Gait normal.  Psychiatric:        Attention and Perception: She is attentive.        Mood and Affect: Mood is anxious. Mood is not depressed. Affect is not labile, blunt, angry or inappropriate.        Speech: Speech normal.        Behavior: Behavior normal.        Thought Content: Thought content normal. Thought content is not delusional. Thought  content does not include homicidal or suicidal ideation. Thought content does not include suicidal plan.        Cognition and Memory: Cognition normal.        Judgment: Judgment normal.     Comments: Insight intact. No auditory or visual hallucinations. No delusions.  Anxiety manageable.     Lab Review:     Component Value Date/Time   NA 142 12/31/2021 1405   NA 144 08/06/2021 1022   K 3.9 12/31/2021 1405   CL 108 12/31/2021 1405   CO2 29 12/31/2021 1405   GLUCOSE 55 (L) 12/31/2021 1405   BUN 17  12/31/2021 1405   BUN 14 08/06/2021 1022   CREATININE 0.93 12/31/2021 1405   CREATININE 1.10 (H) 12/18/2021 1601   CALCIUM 9.2 12/31/2021 1405   PROT 6.2 12/31/2021 1405   PROT 6.1 08/06/2021 1022   ALBUMIN 4.1 12/31/2021 1405   ALBUMIN 4.3 08/06/2021 1022   AST 20 12/31/2021 1405   ALT 23 12/31/2021 1405   ALKPHOS 111 12/31/2021 1405   BILITOT 0.3 12/31/2021 1405   BILITOT 0.2 08/06/2021 1022   GFRNONAA >60 11/19/2020 0907   GFRAA 89 04/08/2020 0851       Component Value Date/Time   WBC 4.8 12/31/2021 1405   RBC 4.68 12/31/2021 1405   HGB 14.3 12/31/2021 1405   HGB 14.7 08/06/2021 1022   HCT 43.5 12/31/2021 1405   HCT 44.5 08/06/2021 1022   PLT 238.0 12/31/2021 1405   PLT 210 08/06/2021 1022   MCV 92.9 12/31/2021 1405   MCV 93 08/06/2021 1022   MCH 31.9 12/18/2021 1601   MCHC 33.0 12/31/2021 1405   RDW 13.1 12/31/2021 1405   RDW 12.7 08/06/2021 1022   LYMPHSABS 1.5 12/31/2021 1405   LYMPHSABS 1.2 08/06/2021 1022   MONOABS 0.3 12/31/2021 1405   EOSABS 0.1 12/31/2021 1405   EOSABS 0.0 08/06/2021 1022   BASOSABS 0.0 12/31/2021 1405   BASOSABS 0.0 08/06/2021 1022    No results found for: "POCLITH", "LITHIUM"   No results found for: "PHENYTOIN", "PHENOBARB", "VALPROATE", "CBMZ"   .res Assessment: Plan:    Generalized anxiety disorder - Plan: topiramate (TOPAMAX) 50 MG tablet, propranolol (INDERAL) 20 MG tablet, PARoxetine (PAXIL) 40 MG tablet  Panic disorder with agoraphobia - Plan: propranolol (INDERAL) 20 MG tablet, PARoxetine (PAXIL) 40 MG tablet  Major depression, recurrent, full remission (HCC) - Plan: PARoxetine (PAXIL) 40 MG tablet  Insomnia due to mental condition - Plan: traZODone (DESYREL) 50 MG tablet, zolpidem (AMBIEN) 10 MG tablet  Vitamin D deficiency  Shift work sleep disorder   Overall med combo has worked well for depression and anxiety but is better than last time...  Disc options for benefit more: increase Abilify, topiramate off label, prn  propranolol. Disc SE in detail of each option.   Doing ok without panic with less Xanax XR for now.  Supportive therapy dealing with D with psych px who is dependent on the familyand having another baby.  Discussed potential metabolic side effects associated with atypical antipsychotics, as well as potential risk for movement side effects. Advised pt to contact office if movement side effects occur.  No AIM  We discussed the short-term risks associated with benzodiazepines including sedation and increased fall risk among others.  Discussed long-term side effect risk including dependence, potential withdrawal symptoms, and the potential eventual dose-related risk of dementia.  Newer studies published 2020 dispute risk of dementia with benzodiazepines.  And disc risk of Ambien amnesia.  Consider switch  to Ativan from Xanax to reduce the sedation from Bz.    Better self care and more sleep would help. Protect SLEEP.  Disc borderline low vitamin D levels and psych literature and immune studies suggesting goal of mid range vit d levels.  continue topiramate bc help weight gain risk to 200mg  daily.  Disc SE.  Rx prn propranolol 20-40 mg BID for anxiety.   Disc polypharmacy is medcially necessary and has been helpful.  It has taken a lot of meds and combination of mechanisms in order to manage her depression and anxiety symptoms.  She tolerates the higher dose of paroxetine without side effects.  Panic was not managed with less paroxetine. Disc purpose of each med.  Disc SE in detail and SSRI withdrawal sx. Discussed serotonin syndrome.  Avoid dextromethorphan, tramadol which can increase risk. Continue alprazolam XR 0.5 mg every morning and 1 mg nightly Continue paroxetine 80 mg daily Continue propranolol 20 mg tablets 1-2 twice daily as needed anxiety Continue topiramate 50 mg tablets 2 twice daily Continue trazodone 100 mg nightly Continue zolpidem 5 to 10 mg nightly as needed insomnia She  typically will alternate trazodone or Ambien depending on her work schedule.  Stop Abilify Trial Vraylar to see if wt loss can occur.  1.5 mg daily.  This appt was 30 mins.  FU 3  mos  Meredith Staggers, MD, DFAPA     Please see After Visit Summary for patient specific instructions.  Future Appointments  Date Time Provider Department Center  07/04/2023  9:30 AM Cottle, Steva Ready., MD [facility] None     No orders of the defined types were placed in this encounter.     -------------------------------

## 2023-04-05 ENCOUNTER — Other Ambulatory Visit (HOSPITAL_BASED_OUTPATIENT_CLINIC_OR_DEPARTMENT_OTHER): Payer: Self-pay

## 2023-05-09 ENCOUNTER — Other Ambulatory Visit (HOSPITAL_BASED_OUTPATIENT_CLINIC_OR_DEPARTMENT_OTHER): Payer: Self-pay

## 2023-05-09 MED ORDER — TIRZEPATIDE-WEIGHT MANAGEMENT 2.5 MG/0.5ML ~~LOC~~ SOAJ
2.5000 mg | SUBCUTANEOUS | 0 refills | Status: DC
  Filled 2023-05-09: qty 2, 28d supply, fill #0

## 2023-05-10 ENCOUNTER — Other Ambulatory Visit (HOSPITAL_BASED_OUTPATIENT_CLINIC_OR_DEPARTMENT_OTHER): Payer: Self-pay

## 2023-05-11 ENCOUNTER — Other Ambulatory Visit (HOSPITAL_BASED_OUTPATIENT_CLINIC_OR_DEPARTMENT_OTHER): Payer: Self-pay

## 2023-05-13 ENCOUNTER — Encounter (HOSPITAL_BASED_OUTPATIENT_CLINIC_OR_DEPARTMENT_OTHER): Payer: Self-pay

## 2023-05-13 ENCOUNTER — Other Ambulatory Visit (HOSPITAL_BASED_OUTPATIENT_CLINIC_OR_DEPARTMENT_OTHER): Payer: Self-pay

## 2023-05-18 ENCOUNTER — Other Ambulatory Visit: Payer: Self-pay | Admitting: Psychiatry

## 2023-05-18 ENCOUNTER — Other Ambulatory Visit (HOSPITAL_BASED_OUTPATIENT_CLINIC_OR_DEPARTMENT_OTHER): Payer: Self-pay

## 2023-05-18 DIAGNOSIS — F3342 Major depressive disorder, recurrent, in full remission: Secondary | ICD-10-CM

## 2023-05-18 MED ORDER — MELOXICAM 15 MG PO TABS
15.0000 mg | ORAL_TABLET | Freq: Every day | ORAL | 3 refills | Status: DC
  Filled 2023-05-18 – 2023-05-31 (×2): qty 30, 30d supply, fill #0
  Filled 2023-06-30: qty 30, 30d supply, fill #1
  Filled 2023-09-01: qty 30, 30d supply, fill #2

## 2023-05-18 NOTE — Telephone Encounter (Signed)
 Lv 12/2 Stop Abilify  - refused rx.

## 2023-05-19 DIAGNOSIS — E663 Overweight: Secondary | ICD-10-CM | POA: Diagnosis not present

## 2023-05-19 DIAGNOSIS — R632 Polyphagia: Secondary | ICD-10-CM | POA: Diagnosis not present

## 2023-05-19 DIAGNOSIS — F411 Generalized anxiety disorder: Secondary | ICD-10-CM | POA: Diagnosis not present

## 2023-05-19 DIAGNOSIS — Z8639 Personal history of other endocrine, nutritional and metabolic disease: Secondary | ICD-10-CM | POA: Diagnosis not present

## 2023-05-19 DIAGNOSIS — E782 Mixed hyperlipidemia: Secondary | ICD-10-CM | POA: Diagnosis not present

## 2023-05-19 DIAGNOSIS — Z6828 Body mass index (BMI) 28.0-28.9, adult: Secondary | ICD-10-CM | POA: Diagnosis not present

## 2023-05-19 DIAGNOSIS — R635 Abnormal weight gain: Secondary | ICD-10-CM | POA: Diagnosis not present

## 2023-05-23 ENCOUNTER — Other Ambulatory Visit (HOSPITAL_BASED_OUTPATIENT_CLINIC_OR_DEPARTMENT_OTHER): Payer: Self-pay

## 2023-05-30 ENCOUNTER — Other Ambulatory Visit (HOSPITAL_BASED_OUTPATIENT_CLINIC_OR_DEPARTMENT_OTHER): Payer: Self-pay

## 2023-05-31 ENCOUNTER — Other Ambulatory Visit (HOSPITAL_BASED_OUTPATIENT_CLINIC_OR_DEPARTMENT_OTHER): Payer: Self-pay

## 2023-05-31 ENCOUNTER — Other Ambulatory Visit: Payer: Self-pay

## 2023-05-31 ENCOUNTER — Other Ambulatory Visit: Payer: Self-pay | Admitting: Psychiatry

## 2023-05-31 DIAGNOSIS — F3342 Major depressive disorder, recurrent, in full remission: Secondary | ICD-10-CM

## 2023-05-31 DIAGNOSIS — F411 Generalized anxiety disorder: Secondary | ICD-10-CM

## 2023-05-31 DIAGNOSIS — F4001 Agoraphobia with panic disorder: Secondary | ICD-10-CM

## 2023-05-31 MED ORDER — PROPRANOLOL HCL 20 MG PO TABS
20.0000 mg | ORAL_TABLET | Freq: Every day | ORAL | 0 refills | Status: DC | PRN
Start: 2023-05-31 — End: 2024-03-05
  Filled 2023-05-31 – 2023-11-04 (×2): qty 180, 90d supply, fill #0

## 2023-06-03 ENCOUNTER — Other Ambulatory Visit (HOSPITAL_BASED_OUTPATIENT_CLINIC_OR_DEPARTMENT_OTHER): Payer: Self-pay

## 2023-06-03 ENCOUNTER — Other Ambulatory Visit: Payer: Self-pay | Admitting: Psychiatry

## 2023-06-03 ENCOUNTER — Telehealth: Payer: Self-pay

## 2023-06-03 DIAGNOSIS — F3342 Major depressive disorder, recurrent, in full remission: Secondary | ICD-10-CM

## 2023-06-03 MED ORDER — ARIPIPRAZOLE 10 MG PO TABS
10.0000 mg | ORAL_TABLET | Freq: Every day | ORAL | 0 refills | Status: DC
Start: 2023-06-03 — End: 2023-09-01
  Filled 2023-06-03: qty 90, 90d supply, fill #0

## 2023-06-03 NOTE — Telephone Encounter (Signed)
She would like it sent to [facility]!

## 2023-06-03 NOTE — Telephone Encounter (Signed)
Yes.  Stop Vraylar and resume Abilify 10 mg daily if she wants .  I'll send it in.

## 2023-06-03 NOTE — Telephone Encounter (Signed)
 Ok. Done

## 2023-06-12 ENCOUNTER — Other Ambulatory Visit: Payer: Self-pay

## 2023-06-12 ENCOUNTER — Ambulatory Visit
Admission: EM | Admit: 2023-06-12 | Discharge: 2023-06-12 | Disposition: A | Discharge status: Home / Self Care | Payer: Commercial Managed Care - PPO | Attending: Family Medicine | Admitting: Family Medicine

## 2023-06-12 DIAGNOSIS — B349 Viral infection, unspecified: Secondary | ICD-10-CM

## 2023-06-12 DIAGNOSIS — R051 Acute cough: Secondary | ICD-10-CM

## 2023-06-12 LAB — POC COVID19/FLU A&B COMBO
Covid Antigen, POC: NEGATIVE
Influenza A Antigen, POC: NEGATIVE
Influenza B Antigen, POC: NEGATIVE

## 2023-06-12 NOTE — ED Provider Notes (Signed)
 [facility]    CSN: [id number] Arrival date & time: 06/12/23  0803      History   Chief Complaint Chief Complaint  Patient presents with   Nasal Congestion    HPI Brooke [name] is a 53 y.o. female.   The history is provided by the patient.  Not feeling well since last p.m. symptoms include low-grade fever of 99, rhinorrhea, nasal congestion, scratchy throat, cough.  Admits headache, body aches and fatigue.  Admits work contacts with illness.  Denies household contacts with illness.  Denies chest pain,  abdominal pain.  States has been feeling short of breath for several weeks usually with activity, she attributes it to weight gain.  Denies chest pain or dizzines  Past Medical History:  Diagnosis Date   Anxiety    Arthritis    right shoulder   Back pain 12/13/2011   Bursitis    Constipation    Depression    Fatty liver    seen on [place]  04-2018   HLD (hyperlipidemia)    Hyperglycemia 03/05/2013   Hypotension    Incontinence    Lower extremity edema    Lumbar stress fracture    Obesity    Osteoporosis    SOB (shortness of breath)     Patient Active Problem List   Diagnosis Date Noted   RUQ abdominal pain 12/18/2021   Vitamin D  deficiency 06/04/2020   Mixed hyperlipidemia 06/04/2020   Polyphagia, controlled with Wegovy  06/04/2020   Chronic low blood pressure 06/04/2020   Greater trochanteric bursitis of right hip 04/11/2019   GAD (generalized anxiety disorder) 04/17/2018   Panic disorder with agoraphobia 04/17/2018   Recurrent UTI 02/06/2014   Osteoporosis 01/10/2014   Urinary frequency 09/04/2012   Sleep disturbance 12/13/2011   FEMALE STRESS INCONTINENCE 09/05/2009   DEPRESSION 05/31/2008    Past Surgical History:  Procedure Laterality Date   BREAST BIOPSY Right 12/27/2018   NEG   COLONOSCOPY     EXCISION/RELEASE BURSA HIP Right 04/11/2019   Procedure: Right hip bursectomy;  Surgeon: Heide Ingle, MD;  Location: [facility];  Service:  Orthopedics;  Laterality: Right;    RHINOPLASTY     SINUS ENDO W/FUSION     TONSILLECTOMY      OB History     Gravida  3   Para  3   Term      Preterm      AB      Living         SAB      IAB      Ectopic      Multiple      Live Births               Home Medications    Prior to Admission medications   Medication Sig Start Date End Date Taking? Authorizing Provider  acetaminophen  (TYLENOL ) 500 MG tablet Take 1,000 mg by mouth every 6 (six) hours as needed for moderate pain.    [provider]  alendronate  (FOSAMAX ) 70 MG tablet Take 1 tablet (70 mg total) by mouth every 7 (seven) days. Take with a full glass of water on an empty stomach. 07/28/22   Antonio Meth, Jamee SAUNDERS, DO  ALPRAZolam  (ALPRAZOLAM  XR) 0.5 MG 24 hr tablet Take 1 tablet (0.5 mg total) by mouth in the morning and 2 tablets (1 mg total) by mouth at night. 09/28/22   Cottle, Lorene KANDICE Raddle., MD  AMBULATORY NON FORMULARY MEDICATION Medication Name: Diltiazem  2% compound with Lidocaine  5% 10/22/20   Eda Iha, MD  amoxicillin -clavulanate (AUGMENTIN ) 875-125 MG tablet Take 1 tablet by mouth 2 (two) times daily. 09/16/22   Antonio Cyndee Jamee JONELLE, DO  ARIPiprazole  (ABILIFY ) 10 MG tablet Take 1 tablet (10 mg total) by mouth daily. 06/03/23   Cottle, Lorene KANDICE Raddle., MD  azelastine  (ASTELIN ) 0.1 % nasal spray Place 1 spray into both nostrils 2 (two) times daily. Use in each nostril as directed 09/16/22   Antonio Cyndee, Yvonne R, DO  Calcium Carb-Cholecalciferol  (CALCIUM 600+D) 600-800 MG-UNIT TABS Take 2 tablets by mouth daily.    [provider]  celecoxib  (CELEBREX ) 100 MG capsule Take 1 capsule (100 mg total) by mouth 2 (two) times daily. 08/03/22   Antonio Cyndee Jamee JONELLE, DO  fluticasone  (FLONASE ) 50 MCG/ACT nasal spray Place 2 sprays into both nostrils daily. 09/16/22   Antonio Cyndee Jamee JONELLE, DO  ibuprofen  (ADVIL ) 200 MG tablet Take 800 mg by mouth every 6 (six) hours as needed for moderate pain  or headache.    [provider]  Melatonin 5 MG CHEW Chew 10 mg by mouth daily.    [provider]  meloxicam  (MOBIC ) 15 MG tablet Take 1 tablet (15 mg total) by mouth daily with food 05/18/23     PARoxetine  (PAXIL ) 40 MG tablet Take 2 tablets (80 mg total) by mouth at bedtime. 04/04/23   Cottle, Lorene KANDICE Raddle., MD  phentermine  (ADIPEX-P ) 37.5 MG tablet Take 1 tablet (37.5 mg total) by mouth in the morning AND 0.5 tablets (18.75 mg total) daily at 12 noon. Patient not taking: Reported on 04/04/2023 03/15/23     polyethylene glycol (MIRALAX  / GLYCOLAX ) 17 g packet Take 17 g by mouth daily.    [provider]  propranolol  (INDERAL ) 20 MG tablet Take 1-2 tablets (20-40 mg total) by mouth daily as needed (anxiety). 05/31/23   Cottle, Lorene KANDICE Raddle., MD  tirzepatide  (ZEPBOUND ) 10 MG/0.5ML Pen Inject 10 mg into the skin once a week. Patient not taking: Reported on 04/04/2023 06/19/22     tirzepatide  (ZEPBOUND ) 12.5 MG/0.5ML Pen Inject 12.5 mg into the skin every 7 (seven) days. Patient not taking: Reported on 04/04/2023 08/16/22     tirzepatide  (ZEPBOUND ) 15 MG/0.5ML Pen Inject 15 mg into the skin once a week. Patient not taking: Reported on 04/04/2023 12/07/22     tirzepatide  (ZEPBOUND ) 2.5 MG/0.5ML Pen Inject 2.5 mg into the skin once a week. 05/09/23     topiramate  (TOPAMAX ) 50 MG tablet Take 2 tablets (100 mg total) by mouth 2 (two) times daily. 04/04/23   Cottle, Lorene KANDICE Raddle., MD  traZODone  (DESYREL ) 50 MG tablet Take 2 tablets (100 mg total) by mouth at bedtime. 04/04/23   Cottle, Lorene KANDICE Raddle., MD  zolpidem  (AMBIEN ) 10 MG tablet Take 1 tablet (10 mg total) by mouth at bedtime as needed for sleep. 04/04/23   Cottle, Lorene KANDICE Raddle., MD    Family History Family History  Problem Relation Age of Onset   Colon polyps Mother    Thyroid  disease Mother    Hyperlipidemia Mother    Diabetes Mother    Hypertension Mother    Diabetes Father    Hypertension Father    Hyperlipidemia Father    Mental  illness Sister    Diabetes Paternal Aunt    Hypertension Paternal Uncle    Diabetes Paternal Grandmother    Hypertension Paternal Grandmother    Hyperlipidemia Paternal Grandmother    Colon cancer  Neg Hx    Esophageal cancer Neg Hx    Rectal cancer Neg Hx    Stomach cancer Neg Hx     Social History Social History   Tobacco Use   Smoking status: Never   Smokeless tobacco: Never  Vaping Use   Vaping status: Never Used  Substance Use Topics   Alcohol use: No    Alcohol/week: 0.0 standard drinks of alcohol   Drug use: No     Allergies   Patient has no known allergies.   Review of Systems Review of Systems   Physical Exam Triage Vital Signs ED Triage Vitals  Encounter Vitals Group     BP 06/12/23 0816 125/77     Systolic BP Percentile --      Diastolic BP Percentile --      Pulse Rate 06/12/23 0816 100     Resp 06/12/23 0816 18     Temp 06/12/23 0816 98.7 F (37.1 C)     Temp Source 06/12/23 0816 Oral     SpO2 06/12/23 0816 98 %     Weight 06/12/23 0814 150 lb (68 kg)     Height 06/12/23 0814 5' 3 (1.6 m)     Head Circumference --      Peak Flow --      Pain Score 06/12/23 0813 5     Pain Loc --      Pain Education --      Exclude from Growth Chart --    No data found.  Updated Vital Signs BP 125/77 (BP Location: Right Arm)   Pulse 100   Temp 98.7 F (37.1 C) (Oral)   Resp 18   Ht 5' 3 (1.6 m)   Wt 150 lb (68 kg)   LMP 04/03/2018   SpO2 98%   BMI 26.57 kg/m   Visual Acuity Right Eye Distance:   Left Eye Distance:   Bilateral Distance:    Right Eye Near:   Left Eye Near:    Bilateral Near:     Physical Exam Constitutional:      Appearance: She is not ill-appearing.  HENT:     Head: Normocephalic and atraumatic.     Right Ear: Tympanic membrane and ear canal normal.     Left Ear: Tympanic membrane and ear canal normal.     Nose: Congestion present.     Mouth/Throat:     Mouth: Mucous membranes are moist.     Pharynx: No  oropharyngeal exudate or posterior oropharyngeal erythema.  Eyes:     Conjunctiva/sclera: Conjunctivae normal.  Cardiovascular:     Rate and Rhythm: Normal rate and regular rhythm.     Heart sounds: Normal heart sounds.  Pulmonary:     Effort: Pulmonary effort is normal.     Breath sounds: Normal breath sounds.  Musculoskeletal:     Cervical back: Neck supple.  Lymphadenopathy:     Cervical: No cervical adenopathy.  Neurological:     Mental Status: She is alert and oriented to person, place, and time.      UC Treatments / Results  Labs (all labs ordered are listed, but only abnormal results are displayed) Labs Reviewed - No data to display  EKG   Radiology No results found.  Procedures Procedures (including critical care time)  Medications Ordered in UC Medications - No data to display  Initial Impression / Assessment and Plan / UC Course  I have reviewed the triage vital signs and the nursing notes.  Pertinent  labs & imaging results that were available during my care of the patient were reviewed by me and considered in my medical decision making (see chart for details).     53 year old with flulike symptoms exposed at work, normal-appearing afebrile, exam point-of-care COVID negative recommend OTC meds for symptomatic relief.  If symptoms worsen she will do an over-the-counter COVID flu test in 24 hours Final Clinical Impressions(s) / UC Diagnoses   Final diagnoses:  None   Discharge Instructions   None    ED Prescriptions   None    PDMP not reviewed this encounter.   Stanislav Gervase, [place] 06/12/23 [phone]

## 2023-06-12 NOTE — ED Triage Notes (Signed)
 Pt presents with complaints of nasal congestion, cough, headaches, fever, and shortness of breath x 3 days. OTC Ibuprofen  taken with improvement. Pt currently rates her overall pain a 5/10.

## 2023-06-12 NOTE — Discharge Instructions (Signed)
 Supportive Care Medications  These are medications that may help with your symptoms. However, Please note that if your PCP has told you not to use certain products please follow his/her recommendations.  For example: - if you have high BP you should use something similar to Coricidin HPB, not Sudafed or antihistamines with a "D" such as Allegra D. The "D" means decongestant.   -Higher Doses of NSAIDS (Ibuprofen, Aleve etc) with Kidney disease or poorly controlled BP.  Fever, Body aches, Headache  Ibuprofen 200 mg 2 tablets and Acetaminophen 2 tabs 4 times a day just before meals and at bedtime (every 6 hours)    Do not use NSAIDS if you are allergic to NSAIDS, if you have been given oral steroids-prednisone, methylprednisolone, dexamethasone until your steroids are finished, if you are pregnant or breast feeding, or if you have history of kidney or liver disease.   Sore Throat: Cepacol throat lozenges or spray  Cough Drops Warm salt water gargles  How to make saltwater rinses Use warm water, because warmth is more relieving to a sore throat than cold water. Warm water will also help the salt dissolve into the water more effectively. Use any type of salt you have available. Most saltwater rinse recipes call for 8 ounces of warm water and 1 teaspoon of salt. However, if your mouth is tender and the saltwater rinse stings, decrease the salt to a 1/2 teaspoon for the first 1 to 2 days. Bring water to a boil, then remove from heat, add salt, and stir. Let the saltwater cool to a warm temperature before rinsing with it. Once you have finished your rinse, discard leftover solution to avoid contamination.  Nasal Congestion: Flonase nasal spray as directed on the package. Oral decongestants like sudafed but only if you do not have high Blood Pressure if you have high Blood Pressure take CORICIDIN HBP Antihistamines Nasal Saline/Neti Pot  Cough: Expectorants (guaifenesin) help thin mucus making  it easier to get up. This should be used during the day. During the day coughing helps bring mucus up out of your lungs Suppressants (dextromethorphan) just for bedtime so you can sleep   Oral rehydration is important when you have been sweating more than usual and/or having nausea and vomiting. You can use over the counter rehydration supplements like Pedialyte sport, Gatorlyte, Electrolit, Liquid IV, or you can make your own at home. Recipe:  Mix 1 liter of clean or boiled water with 6 teaspoons (2 tablespoons) of sugar and 1/2 tsp of salt. Stir until both dissolve.  You can add sugar free flavoring (i.e. Crystal light) if desired.  Drink small sips frequently rather than large amounts at once to prevent nausea.

## 2023-06-14 ENCOUNTER — Ambulatory Visit: Payer: Commercial Managed Care - PPO | Admitting: Family Medicine

## 2023-06-14 ENCOUNTER — Other Ambulatory Visit (HOSPITAL_BASED_OUTPATIENT_CLINIC_OR_DEPARTMENT_OTHER): Payer: Self-pay

## 2023-06-14 VITALS — BP 100/70 | HR 94 | Temp 98.5°F | Resp 18 | Ht 63.0 in | Wt 164.2 lb

## 2023-06-14 DIAGNOSIS — J0140 Acute pansinusitis, unspecified: Secondary | ICD-10-CM | POA: Diagnosis not present

## 2023-06-14 DIAGNOSIS — J101 Influenza due to other identified influenza virus with other respiratory manifestations: Secondary | ICD-10-CM | POA: Insufficient documentation

## 2023-06-14 LAB — POCT RAPID STREP A (OFFICE): Rapid Strep A Screen: NEGATIVE

## 2023-06-14 LAB — POC COVID19 BINAXNOW: SARS Coronavirus 2 Ag: NEGATIVE

## 2023-06-14 LAB — POC INFLUENZA A&B (BINAX/QUICKVUE)
Influenza A, POC: POSITIVE — AB
Influenza B, POC: NEGATIVE — AB

## 2023-06-14 MED ORDER — PROMETHAZINE-DM 6.25-15 MG/5ML PO SYRP
5.0000 mL | ORAL_SOLUTION | Freq: Four times a day (QID) | ORAL | 0 refills | Status: DC | PRN
Start: 2023-06-14 — End: 2023-08-26
  Filled 2023-06-14: qty 118, 6d supply, fill #0

## 2023-06-14 MED ORDER — OSELTAMIVIR PHOSPHATE 75 MG PO CAPS
75.0000 mg | ORAL_CAPSULE | Freq: Two times a day (BID) | ORAL | 0 refills | Status: DC
  Filled 2023-06-14: qty 10, 5d supply, fill #0

## 2023-06-14 NOTE — Assessment & Plan Note (Signed)
Tamiflu 75 bid x 5 days  Phenergan for cough Tylenol/ ib for bodyaches / fever Rest  Return to office as needed

## 2023-06-14 NOTE — Progress Notes (Signed)
Established Patient Office Visit  Subjective   Patient ID: Brooke [name], female    DOB: 1970-06-25  Age: 53 y.o. MRN: [id number]  Chief Complaint  Patient presents with   Cough    Sxs started Saturday, went to UC on Sunday. Flu and covid were negative. UC did not give any meds.    HPI Discussed the use of AI scribe software for clinical note transcription with the patient, who gave verbal consent to proceed.  History of Present Illness   Brooke [name] is a 53 year old female who presents with congestion, fever, and cough.  She has been experiencing congestion and fever since Sunday, with her temperature reaching 100.62F, which she considers significant as her baseline temperature is typically lower. She has been using DayQuil and Sudafed to manage these symptoms.  She describes a persistent cough that began on Saturday, severe enough to cause soreness in her back, suggesting a possible muscle strain. The cough worsens with activity and is not more pronounced at night. She also experiences shortness of breath but denies any specific triggers.  She mentions difficulty swallowing, particularly when taking NyQuil, which she attempted to use for symptom relief. She has a history of strep throat, but only once at age 3, which led to a tonsillectomy.  She has been able to maintain some nutrition, consuming milkshakes without issue, and denies nausea unless related to mucus production.  She is concerned about the possibility of having the flu, as she has been experiencing symptoms since Saturday.      Patient Active Problem List   Diagnosis Date Noted   Influenza A 06/14/2023   RUQ abdominal pain 12/18/2021   Vitamin D deficiency 06/04/2020   Mixed hyperlipidemia 06/04/2020   Polyphagia, controlled with [facility] 06/04/2020   Chronic low blood pressure 06/04/2020   Greater trochanteric bursitis of right hip 04/11/2019   GAD (generalized anxiety disorder) 04/17/2018   Panic  disorder with agoraphobia 04/17/2018   Recurrent UTI 02/06/2014   Osteoporosis 01/10/2014   Urinary frequency 09/04/2012   Sleep disturbance 12/13/2011   FEMALE STRESS INCONTINENCE 09/05/2009   DEPRESSION 05/31/2008   Past Medical History:  Diagnosis Date   Anxiety    Arthritis    right shoulder   Back pain 12/13/2011   Bursitis    Constipation    Depression    Fatty liver    seen on [place] 04-2018   HLD (hyperlipidemia)    Hyperglycemia 03/05/2013   Hypotension    Incontinence    Lower extremity edema    Lumbar stress fracture    Obesity    Osteoporosis    SOB (shortness of breath)    Past Surgical History:  Procedure Laterality Date   BREAST BIOPSY Right 12/27/2018   NEG   COLONOSCOPY     EXCISION/RELEASE BURSA HIP Right 04/11/2019   Procedure: Right hip bursectomy;  Surgeon: Ranee Gosselin, MD;  Location: [facility];  Service: Orthopedics;  Laterality: Right;    RHINOPLASTY     SINUS ENDO W/FUSION     TONSILLECTOMY     Social History   Tobacco Use   Smoking status: Never   Smokeless tobacco: Never  Vaping Use   Vaping status: Never Used  Substance Use Topics   Alcohol use: No    Alcohol/week: 0.0 standard drinks of alcohol   Drug use: No   Social History   Socioeconomic History   Marital status: Married    Spouse name: Jonny Ruiz   Number  of children: Not on file   Years of education: 16   Highest education level: Associate degree: occupational, Scientist, product/process development, or vocational program  Occupational History   Occupation: RN/short stay    Employer: [employer]  Tobacco Use   Smoking status: Never   Smokeless tobacco: Never  Vaping Use   Vaping status: Never Used  Substance and Sexual Activity   Alcohol use: No    Alcohol/week: 0.0 standard drinks of alcohol   Drug use: No   Sexual activity: Yes    Partners: Male    Birth control/protection: Surgical    Comment: Husband's vasectomy  Other Topics Concern   Not on file  Social History Narrative   Not on  file   Social Drivers of Health   Financial Resource Strain: Low Risk  (06/14/2023)   Overall Financial Resource Strain (CARDIA)    Difficulty of Paying Living Expenses: Not hard at all  Food Insecurity: No Food Insecurity (06/14/2023)   Hunger Vital Sign    Worried About Running Out of Food in the Last Year: Never true    Ran Out of Food in the Last Year: Never true  Transportation Needs: No Transportation Needs (06/14/2023)   PRAPARE - Administrator, Civil Service (Medical): No    Lack of Transportation (Non-Medical): No  Physical Activity: Insufficiently Active (06/14/2023)   Exercise Vital Sign    Days of Exercise per Week: 2 days    Minutes of Exercise per Session: 20 min  Stress: Stress Concern Present (06/14/2023)   [employer] of Occupational Health - Occupational Stress Questionnaire    Feeling of Stress : Very much  Social Connections: Unknown (06/14/2023)   Social Connection and Isolation Panel [NHANES]    Frequency of Communication with Friends and Family: More than three times a week    Frequency of Social Gatherings with Friends and Family: Twice a week    Attends Religious Services: Patient declined    Database administrator or Organizations: Yes    Attends Engineer, structural: 1 to 4 times per year    Marital Status: Married  Catering manager Violence: Not on file   Family Status  Relation Name Status   Mother  Alive   Father  Alive   Sister  Alive   Sister  Deceased   Brother  Alive   Brother  Alive   Oceanographer  (Not Specified)   Nutritional therapist  (Not Specified)   PGM  Alive   PGF  Deceased   Neg Hx  (Not Specified)  No partnership data on file   Family History  Problem Relation Age of Onset   Colon polyps Mother    Thyroid disease Mother    Hyperlipidemia Mother    Diabetes Mother    Hypertension Mother    Diabetes Father    Hypertension Father    Hyperlipidemia Father    Mental illness Sister    Diabetes Paternal Aunt     Hypertension Paternal Uncle    Diabetes Paternal Grandmother    Hypertension Paternal Grandmother    Hyperlipidemia Paternal Grandmother    Colon cancer Neg Hx    Esophageal cancer Neg Hx    Rectal cancer Neg Hx    Stomach cancer Neg Hx    No Known Allergies    Review of Systems  Constitutional:  Positive for chills, fever and malaise/fatigue.  HENT:  Positive for congestion and sore throat. Negative for ear discharge, ear pain and nosebleeds.  Respiratory:  Positive for cough, sputum production and shortness of breath.   Psychiatric/Behavioral: Negative.        Objective:     BP 100/70 (BP Location: Left Arm, Patient Position: Sitting, Cuff Size: Normal)   Pulse 94   Temp 98.5 F (36.9 C) (Oral)   Resp 18   Ht 5\' 3"  (1.6 m)   Wt 164 lb 3.2 oz (74.5 kg)   LMP 04/03/2018   SpO2 96%   BMI 29.09 kg/m  BP Readings from Last 3 Encounters:  06/14/23 100/70  06/12/23 125/77  09/16/22 100/80   Wt Readings from Last 3 Encounters:  06/14/23 164 lb 3.2 oz (74.5 kg)  06/12/23 150 lb (68 kg)  09/16/22 141 lb 3.2 oz (64 kg)   SpO2 Readings from Last 3 Encounters:  06/14/23 96%  06/12/23 98%  09/16/22 98%      Physical Exam Vitals and nursing note reviewed.  Constitutional:      General: She is not in acute distress.    Appearance: Normal appearance. She is well-developed. She is ill-appearing.  HENT:     Head: Normocephalic and atraumatic.     Nose: Congestion present.  Eyes:     General: No scleral icterus.       Right eye: No discharge.        Left eye: No discharge.  Cardiovascular:     Rate and Rhythm: Normal rate and regular rhythm.     Heart sounds: No murmur heard. Pulmonary:     Effort: Pulmonary effort is normal. No respiratory distress.     Breath sounds: Normal breath sounds.  Musculoskeletal:        General: Normal range of motion.     Cervical back: Normal range of motion and neck supple. No tenderness.     Right lower leg: No edema.     Left  lower leg: No edema.  Lymphadenopathy:     Cervical: No cervical adenopathy.  Skin:    General: Skin is warm and dry.  Neurological:     Mental Status: She is alert and oriented to person, place, and time.  Psychiatric:        Mood and Affect: Mood normal.        Behavior: Behavior normal.        Thought Content: Thought content normal.        Judgment: Judgment normal.      Results for orders placed or performed in visit on 06/14/23  POC COVID-19  Result Value Ref Range   SARS Coronavirus 2 Ag Negative Negative  POC Influenza A&B (Binax test)  Result Value Ref Range   Influenza A, POC Positive (A) Negative   Influenza B, POC Negative (A) Negative  POCT rapid strep A  Result Value Ref Range   Rapid Strep A Screen Negative Negative    Last CBC Lab Results  Component Value Date   WBC 4.8 12/31/2021   HGB 14.3 12/31/2021   HCT 43.5 12/31/2021   MCV 92.9 12/31/2021   MCH 31.9 12/18/2021   RDW 13.1 12/31/2021   PLT 238.0 12/31/2021   Last metabolic panel Lab Results  Component Value Date   GLUCOSE 55 (L) 12/31/2021   NA 142 12/31/2021   K 3.9 12/31/2021   CL 108 12/31/2021   CO2 29 12/31/2021   BUN 17 12/31/2021   CREATININE 0.93 12/31/2021   GFR 70.30 12/31/2021   CALCIUM 9.2 12/31/2021   PHOS 2.6 01/10/2014   PROT 6.2 12/31/2021  ALBUMIN 4.1 12/31/2021   LABGLOB 1.8 08/06/2021   AGRATIO 2.4 (H) 08/06/2021   BILITOT 0.3 12/31/2021   ALKPHOS 111 12/31/2021   AST 20 12/31/2021   ALT 23 12/31/2021   ANIONGAP 7 11/19/2020   Last lipids Lab Results  Component Value Date   CHOL 220 (H) 12/31/2021   HDL 48.80 12/31/2021   LDLCALC 133 (H) 12/31/2021   LDLDIRECT 130.6 09/05/2009   TRIG 193.0 (H) 12/31/2021   CHOLHDL 5 12/31/2021   Last hemoglobin A1c Lab Results  Component Value Date   HGBA1C 5.4 12/31/2021   Last thyroid functions Lab Results  Component Value Date   TSH 2.08 12/31/2021   Last vitamin D Lab Results  Component Value Date    VD25OH 32.3 07/08/2021   Last vitamin B12 and Folate Lab Results  Component Value Date   VITAMINB12 600 07/08/2021      The 10-year ASCVD risk score (Arnett DK, et al., 2019) is: 1.4%    Assessment & Plan:   Problem List Items Addressed This Visit       Unprioritized   Influenza A - Primary   Tamiflu 75 bid x 5 days  Phenergan for cough Tylenol/ ib for bodyaches / fever Rest  Return to office as needed       Relevant Medications   oseltamivir (TAMIFLU) 75 MG capsule   promethazine-dextromethorphan (PROMETHAZINE-DM) 6.25-15 MG/5ML syrup   Other Visit Diagnoses       Acute non-recurrent pansinusitis       Relevant Medications   oseltamivir (TAMIFLU) 75 MG capsule   promethazine-dextromethorphan (PROMETHAZINE-DM) 6.25-15 MG/5ML syrup   Other Relevant Orders   POC COVID-19 (Completed)   POC Influenza A&B (Binax test) (Completed)   POCT rapid strep A (Completed)     Assessment and Plan    Influenza A Presents with fever peaking at 100.73F, congestion, productive cough, and myalgia since Saturday. Flu A confirmed via swab test. Differential diagnosis included bronchitis and COVID-19, but the COVID-19 test was negative. Discussed Tamiflu benefits, noting it reduces symptom duration by 1-2 days if started within 48 hours. Contagious until fever-free for 24 hours without antipyretics. Provided travel precautions and guidance for worsening symptoms. Prescribe Tamiflu and cough syrup. Advise staying home from work until fever-free for 24 hours without antipyretics. Call if symptoms worsen or if additional care is needed while traveling.  General Health Maintenance Received flu shot, but it did not cover the current strain of Influenza A. Discussed the importance of annual flu vaccinations and strain variability. Advise continuing annual flu vaccinations.  Follow-up Follow up via MyChart if needed. Provide doctor's note for work absence.        Return if symptoms worsen or  fail to improve.    Donato Schultz, DO

## 2023-06-14 NOTE — Patient Instructions (Signed)

## 2023-06-22 ENCOUNTER — Other Ambulatory Visit: Payer: Self-pay | Admitting: Family Medicine

## 2023-06-22 ENCOUNTER — Encounter: Payer: Self-pay | Admitting: Family Medicine

## 2023-06-22 DIAGNOSIS — J0140 Acute pansinusitis, unspecified: Secondary | ICD-10-CM

## 2023-06-22 MED ORDER — AMOXICILLIN-POT CLAVULANATE 875-125 MG PO TABS
1.0000 | ORAL_TABLET | Freq: Two times a day (BID) | ORAL | 0 refills | Status: DC
  Filled 2023-06-22: qty 20, 10d supply, fill #0

## 2023-06-23 ENCOUNTER — Other Ambulatory Visit (HOSPITAL_BASED_OUTPATIENT_CLINIC_OR_DEPARTMENT_OTHER): Payer: Self-pay

## 2023-07-04 ENCOUNTER — Ambulatory Visit: Payer: 59 | Admitting: Psychiatry

## 2023-07-04 DIAGNOSIS — Z91199 Patient's noncompliance with other medical treatment and regimen due to unspecified reason: Secondary | ICD-10-CM

## 2023-07-04 NOTE — Progress Notes (Signed)
 No show

## 2023-07-11 ENCOUNTER — Other Ambulatory Visit: Payer: Self-pay | Admitting: Psychiatry

## 2023-07-11 DIAGNOSIS — F3342 Major depressive disorder, recurrent, in full remission: Secondary | ICD-10-CM

## 2023-07-11 DIAGNOSIS — F4001 Agoraphobia with panic disorder: Secondary | ICD-10-CM

## 2023-07-11 DIAGNOSIS — F411 Generalized anxiety disorder: Secondary | ICD-10-CM

## 2023-07-12 ENCOUNTER — Other Ambulatory Visit (HOSPITAL_BASED_OUTPATIENT_CLINIC_OR_DEPARTMENT_OTHER): Payer: Self-pay

## 2023-07-12 MED ORDER — PAROXETINE HCL 40 MG PO TABS
80.0000 mg | ORAL_TABLET | Freq: Every day | ORAL | 0 refills | Status: DC
  Filled 2023-07-12: qty 180, 90d supply, fill #0

## 2023-08-26 ENCOUNTER — Ambulatory Visit: Admitting: Physician Assistant

## 2023-08-26 ENCOUNTER — Other Ambulatory Visit (HOSPITAL_BASED_OUTPATIENT_CLINIC_OR_DEPARTMENT_OTHER): Payer: Self-pay

## 2023-08-26 ENCOUNTER — Encounter: Payer: Self-pay | Admitting: Physician Assistant

## 2023-08-26 VITALS — BP 106/71 | HR 96 | Ht 63.0 in | Wt 167.0 lb

## 2023-08-26 DIAGNOSIS — L237 Allergic contact dermatitis due to plants, except food: Secondary | ICD-10-CM

## 2023-08-26 MED ORDER — TRIAMCINOLONE ACETONIDE 0.1 % EX CREA
1.0000 | TOPICAL_CREAM | Freq: Two times a day (BID) | CUTANEOUS | 0 refills | Status: DC
  Filled 2023-08-26: qty 30, 15d supply, fill #0

## 2023-08-26 MED ORDER — METHYLPREDNISOLONE 4 MG PO TBPK
ORAL_TABLET | ORAL | 0 refills | Status: AC
  Filled 2023-08-26: qty 21, 6d supply, fill #0

## 2023-08-26 NOTE — Progress Notes (Signed)
 Established patient visit   Patient: Brooke [name]   DOB: 16-Oct-1970   53 y.o. Female  MRN: [id number] Visit Date: 08/26/2023  Today's healthcare provider: Trenton Frock, PA-C   Cc. Rash  Subjective     Pt reports she was working in the yard Saturday and came across some poison ivy. She has not been able to get rid of it herself, and it has spread from her arms to her belly, legs, and buttocks. Using topical calamine lotion.  Medications: Outpatient Medications Prior to Visit  Medication Sig   acetaminophen  (TYLENOL ) 500 MG tablet Take 1,000 mg by mouth every 6 (six) hours as needed for moderate pain.   alendronate  (FOSAMAX ) 70 MG tablet Take 1 tablet (70 mg total) by mouth every 7 (seven) days. Take with a full glass of water on an empty stomach.   ALPRAZolam  (ALPRAZOLAM  XR) 0.5 MG 24 hr tablet Take 1 tablet (0.5 mg total) by mouth in the morning and 2 tablets (1 mg total) by mouth at night.   AMBULATORY NON FORMULARY MEDICATION Medication Name: Diltiazem 2% compound with Lidocaine  5%   ARIPiprazole  (ABILIFY ) 10 MG tablet Take 1 tablet (10 mg total) by mouth daily.   azelastine  (ASTELIN ) 0.1 % nasal spray Place 1 spray into both nostrils 2 (two) times daily. Use in each nostril as directed   Calcium Carb-Cholecalciferol  (CALCIUM 600+D) 600-800 MG-UNIT TABS Take 2 tablets by mouth daily.   celecoxib  (CELEBREX ) 100 MG capsule Take 1 capsule (100 mg total) by mouth 2 (two) times daily.   fluticasone  (FLONASE ) 50 MCG/ACT nasal spray Place 2 sprays into both nostrils daily.   ibuprofen  (ADVIL ) 200 MG tablet Take 800 mg by mouth every 6 (six) hours as needed for moderate pain or headache.   Melatonin 5 MG CHEW Chew 10 mg by mouth daily.   meloxicam  (MOBIC ) 15 MG tablet Take 1 tablet (15 mg total) by mouth daily with food   PARoxetine  (PAXIL ) 40 MG tablet Take 2 tablets (80 mg total) by mouth at bedtime.   polyethylene glycol (MIRALAX  / GLYCOLAX ) 17 g packet Take 17 g by mouth  daily.   propranolol  (INDERAL ) 20 MG tablet Take 1-2 tablets (20-40 mg total) by mouth daily as needed (anxiety).   topiramate  (TOPAMAX ) 50 MG tablet Take 2 tablets (100 mg total) by mouth 2 (two) times daily.   traZODone  (DESYREL ) 50 MG tablet Take 2 tablets (100 mg total) by mouth at bedtime.   zolpidem  (AMBIEN ) 10 MG tablet Take 1 tablet (10 mg total) by mouth at bedtime as needed for sleep.   [DISCONTINUED] amoxicillin -clavulanate (AUGMENTIN ) 875-125 MG tablet Take 1 tablet by mouth 2 (two) times daily.   [DISCONTINUED] amoxicillin -clavulanate (AUGMENTIN ) 875-125 MG tablet Take 1 tablet by mouth 2 (two) times daily.   [DISCONTINUED] oseltamivir  (TAMIFLU ) 75 MG capsule Take 1 capsule (75 mg total) by mouth 2 (two) times daily.   [DISCONTINUED] promethazine -dextromethorphan (PROMETHAZINE -DM) 6.25-15 MG/5ML syrup Take 5 mLs by mouth 4 (four) times daily as needed.   [DISCONTINUED] tirzepatide  (ZEPBOUND ) 2.5 MG/0.5ML Pen Inject 2.5 mg into the skin once a week.   No facility-administered medications prior to visit.    Review of Systems  Constitutional:  Negative for fatigue and fever.  Respiratory:  Negative for cough and shortness of breath.   Cardiovascular:  Negative for chest pain and leg swelling.  Gastrointestinal:  Negative for abdominal pain.  Skin:  Positive for rash.  Neurological:  Negative for dizziness and headaches.  Objective    BP 106/71   Pulse 96   Ht 5\' 3"  (1.6 m)   Wt 167 lb (75.8 kg)   LMP 04/03/2018   BMI 29.58 kg/m    Physical Exam Vitals reviewed.  Constitutional:      Appearance: She is not ill-appearing.  HENT:     Head: Normocephalic.  Eyes:     Conjunctiva/sclera: Conjunctivae normal.  Cardiovascular:     Rate and Rhythm: Normal rate.  Pulmonary:     Effort: Pulmonary effort is normal. No respiratory distress.  Skin:    Comments: Raised linear and papular erythematous rash w/ excoriation marks b/l arms, small patch on right lower leg,  abdomen  Neurological:     Mental Status: She is alert and oriented to person, place, and time.  Psychiatric:        Mood and Affect: Mood normal.        Behavior: Behavior normal.     No results found for any visits on 08/26/23.  Assessment & Plan    Poison ivy dermatitis -     methylPREDNISolone ; Take 6 tablets (24 mg total) by mouth daily for 1 day, THEN 5 tablets (20 mg total) daily for 1 day, THEN 4 tablets (16 mg total) daily for 1 day, THEN 3 tablets (12 mg total) daily for 1 day, THEN 2 tablets (8 mg total) daily for 1 day, THEN 1 tablet (4 mg total) daily for 1 day.  Dispense: 21 tablet; Refill: 0 -     Triamcinolone  Acetonide; Apply 1 Application topically 2 (two) times daily.  Dispense: 30 g; Refill: 0   Rx medrol  dose back, topical triamcinolone  cream.  Return if symptoms worsen or fail to improve.       Trenton Frock, PA-C  [facility] [phone] (phone) [phone] (fax)  [facility]

## 2023-08-29 ENCOUNTER — Ambulatory Visit: Admitting: Family Medicine

## 2023-09-01 ENCOUNTER — Other Ambulatory Visit: Payer: Self-pay

## 2023-09-01 ENCOUNTER — Other Ambulatory Visit: Payer: Self-pay | Admitting: Psychiatry

## 2023-09-01 DIAGNOSIS — F5105 Insomnia due to other mental disorder: Secondary | ICD-10-CM

## 2023-09-01 DIAGNOSIS — F3342 Major depressive disorder, recurrent, in full remission: Secondary | ICD-10-CM

## 2023-09-02 ENCOUNTER — Other Ambulatory Visit (HOSPITAL_BASED_OUTPATIENT_CLINIC_OR_DEPARTMENT_OTHER): Payer: Self-pay

## 2023-09-02 MED ORDER — TRAZODONE HCL 50 MG PO TABS
100.0000 mg | ORAL_TABLET | Freq: Every evening | ORAL | 0 refills | Status: DC
  Filled 2023-09-02 – 2023-09-12 (×2): qty 180, 90d supply, fill #0

## 2023-09-02 MED ORDER — ARIPIPRAZOLE 10 MG PO TABS
10.0000 mg | ORAL_TABLET | Freq: Every day | ORAL | 0 refills | Status: DC
  Filled 2023-09-02: qty 90, 90d supply, fill #0

## 2023-09-12 ENCOUNTER — Other Ambulatory Visit: Payer: Self-pay

## 2023-09-27 ENCOUNTER — Other Ambulatory Visit: Payer: Self-pay | Admitting: Psychiatry

## 2023-09-27 DIAGNOSIS — F411 Generalized anxiety disorder: Secondary | ICD-10-CM

## 2023-09-27 MED ORDER — TOPIRAMATE 50 MG PO TABS
100.0000 mg | ORAL_TABLET | Freq: Two times a day (BID) | ORAL | 1 refills | Status: DC
  Filled 2023-09-27 – 2023-11-04 (×2): qty 360, 90d supply, fill #0
  Filled 2024-02-06: qty 360, 90d supply, fill #1

## 2023-09-28 ENCOUNTER — Other Ambulatory Visit (HOSPITAL_BASED_OUTPATIENT_CLINIC_OR_DEPARTMENT_OTHER): Payer: Self-pay

## 2023-10-13 ENCOUNTER — Other Ambulatory Visit (HOSPITAL_BASED_OUTPATIENT_CLINIC_OR_DEPARTMENT_OTHER): Payer: Self-pay

## 2023-11-04 ENCOUNTER — Other Ambulatory Visit: Payer: Self-pay | Admitting: Psychiatry

## 2023-11-04 ENCOUNTER — Other Ambulatory Visit: Payer: Self-pay | Admitting: Family Medicine

## 2023-11-04 DIAGNOSIS — F411 Generalized anxiety disorder: Secondary | ICD-10-CM

## 2023-11-04 DIAGNOSIS — F3342 Major depressive disorder, recurrent, in full remission: Secondary | ICD-10-CM

## 2023-11-04 DIAGNOSIS — F4001 Agoraphobia with panic disorder: Secondary | ICD-10-CM

## 2023-11-04 DIAGNOSIS — F5105 Insomnia due to other mental disorder: Secondary | ICD-10-CM

## 2023-11-06 NOTE — Telephone Encounter (Signed)
 Sent MyChart msg to schedule FU, was a no show in March.

## 2023-11-07 ENCOUNTER — Other Ambulatory Visit: Payer: Self-pay

## 2023-11-07 ENCOUNTER — Other Ambulatory Visit (HOSPITAL_BASED_OUTPATIENT_CLINIC_OR_DEPARTMENT_OTHER): Payer: Self-pay

## 2023-11-07 ENCOUNTER — Ambulatory Visit: Admitting: Psychiatry

## 2023-11-07 ENCOUNTER — Encounter: Payer: Self-pay | Admitting: Psychiatry

## 2023-11-07 DIAGNOSIS — F411 Generalized anxiety disorder: Secondary | ICD-10-CM | POA: Diagnosis not present

## 2023-11-07 DIAGNOSIS — F4001 Agoraphobia with panic disorder: Secondary | ICD-10-CM

## 2023-11-07 DIAGNOSIS — F3341 Major depressive disorder, recurrent, in partial remission: Secondary | ICD-10-CM

## 2023-11-07 DIAGNOSIS — F5105 Insomnia due to other mental disorder: Secondary | ICD-10-CM | POA: Diagnosis not present

## 2023-11-07 MED ORDER — TRAZODONE HCL 50 MG PO TABS
100.0000 mg | ORAL_TABLET | Freq: Every evening | ORAL | 0 refills | Status: DC
  Filled 2023-11-07 – 2023-12-27 (×3): qty 180, 90d supply, fill #0

## 2023-11-07 MED ORDER — ZOLPIDEM TARTRATE 10 MG PO TABS
10.0000 mg | ORAL_TABLET | Freq: Every evening | ORAL | 1 refills | Status: AC | PRN
  Filled 2023-11-07 – 2023-12-01 (×5): qty 90, 90d supply, fill #0
  Filled 2024-03-05: qty 90, 90d supply, fill #1

## 2023-11-07 MED ORDER — ALPRAZOLAM ER 0.5 MG PO TB24
1.5000 mg | ORAL_TABLET | Freq: Every day | ORAL | 1 refills | Status: AC
  Filled 2023-11-07: qty 270, 90d supply, fill #0
  Filled 2024-02-06: qty 270, 90d supply, fill #1

## 2023-11-07 MED ORDER — PAROXETINE HCL 40 MG PO TABS
80.0000 mg | ORAL_TABLET | Freq: Every day | ORAL | 0 refills | Status: DC
  Filled 2023-11-07: qty 180, 90d supply, fill #0

## 2023-11-07 MED ORDER — ALENDRONATE SODIUM 70 MG PO TABS
70.0000 mg | ORAL_TABLET | ORAL | 3 refills | Status: AC
  Filled 2023-11-07: qty 12, 84d supply, fill #0
  Filled 2024-02-06: qty 12, 84d supply, fill #1
  Filled 2024-05-10: qty 12, 84d supply, fill #2

## 2023-11-07 NOTE — Progress Notes (Signed)
 Brooke [name] [id number] Jul 27, 1970 53 y.o.  Subjective:   Patient ID:  Brooke [name] is a 53 y.o. (DOB Nov 22, 1970) female.  Chief Complaint:  Chief Complaint  Patient presents with   Follow-up   Depression   Anxiety    HPI Brooke [name] presents to the office today for follow-up of anxiety and insomnia  seen December 2019.  For anxiety and weight gain topiramate  was increased off label to 200 mg daily.  For anxiety propranolol  20 to 40 mg twice daily was added as needed.    seen November 2020: No meds were changed.  The following was noted: OK except for life events.  Still a lot of worry. Managed normally.  H agrees also.  Son's wife pregnant with another man's baby.  Real busy with 60 hours weekly.  Can't take Ambien  at times bc of that.  Realizes she needs to take better self care.   Brooke [name] August 23, 2019 appointment, the following is noted: Still doing well with meds.  Cont intermittent problems with work affecting sleep patterns and which sleep meds she can take.  Satisfied with meds.  No panic.  depression and anxiety manageable not gone.  Panic worse if overworked DT short staffed.  Patient reports stable mood and denies depressed or irritable moods.  Patient reports occasional difficulty with anxiety.  Patient reports occasionally difficulty with sleep initiation or maintenance. Denies appetite disturbance.  Patient reports that energy and motivation have been good.  Patient denies any difficulty with concentration.  Patient denies any suicidal ideation.  04/10/20 appt with following noted: Lost 33# at [facility] on 1000 calorie diet and Wegovy . A little extra anxiety lately without any specific reason for 2-3 mos.  No panic.  Mostly worry  Sx without avoidance.  Able to enjoy things normally.  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   Patient reports stable mood and denies  depressed or irritable moods.  Patient denies difficulty with sleep initiation or maintenance. Denies appetite disturbance.  Patient reports that energy and motivation have been good.  Patient denies any difficulty with concentration.  Patient denies any suicidal ideation. Plan: Continue alprazolam  XR 0.5 mg every morning and 1 mg nightly increase aripiprazole  10 mg daily to see anxiety is better Continue paroxetine  80 mg daily Continue propranolol  20 mg tablets 1-2 twice daily as needed anxiety Continue topiramate  50 mg tablets 2 twice daily Continue trazodone  100 mg nightly Continue zolpidem  5 to 10 mg nightly as needed insomnia She typically will alternate trazodone  or Ambien  depending on her work schedule.  10/09/20 appt noted: Good.  Lost 50# pounds and feels a whole lot better. Using Wegovy  Potential shoulder surgery. No panic.  Anxiety has been controlled.  Not depressed.   Increase in Abilify  helped that a lot. No SE. Still shift work.  Low vitamin D .   No concerns about meds and no reason for changes. Doing well. Plan: No med changes  04/09/2021 appointment with the following noted: Good normal stressors worse this time of year. Propranolol  used prn is helpful for anxiety physically and tolerated. No SE with meds. Still She typically will alternate trazodone  or Ambien  depending on her work schedule. Sleep ok with meds mostly. Working dayshift OR, Clinical biochemist.  Long hours. Lost down to 137 from 190#.  Makes her feel better about herself.  On Wegovy .  [name]. No desire for med  changes Will get labs done. Plan: Continue alprazolam  XR 0.5 mg every morning and 1 mg nightly Continue aripiprazole  10 mg daily  the increase helped anxiety is better Continue paroxetine  80 mg daily Continue propranolol  20 mg tablets 1-2 twice daily as needed anxiety helpful prn Continue topiramate  50 mg tablets 2 twice daily Continue trazodone  100 mg nightly Continue zolpidem  5 to 10 mg nightly  as needed insomnia  10/08/21 appt noted: Good overall .  [name] anxiety with stressors.  No panic. Patient reports stable mood and denies depressed or irritable moods.   Patient denies difficulty with sleep initiation or maintenance. Denies appetite disturbance.  Patient reports that energy and motivation have been good.  Patient denies any difficulty with concentration.  Patient denies any suicidal ideation. Dayshift generally. Propranolol  helps  09/28/22 appt noted: Pretty good with a little added family anxiety.   Manageable. Sleep is ok.   53 yo [redacted] week pregnant.   She will be there to help with baby. No sig panic.  No sig avoidance. No SE .   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  04/04/23 appt noted: Meds:  alprazolam  XR 0.5 mg AM and 1 mg HS, paroxetine  80, topiramate  100 mg BID, trazodone  100 mg HS, Ambien  10 HS.  Abilify  10 Wt gain but  No panic except for phentermine  and stopped it.   Does well with propranolol  prn anxiety.   Interested in trying Vraylar instead of Abilify  to see if can lose wt. Recognizes Paxil  can cause wt gain but tried the most reasonable alternatives.   Patient reports stable mood and denies depressed or irritable moods.  Patient denies any recent difficulty with anxiety.  Patient denies difficulty with sleep initiation or maintenance. Denies appetite disturbance.  Patient reports that energy and motivation have been good.  Patient denies any difficulty with concentration.  Patient denies any suicidal ideation. Plan: Stop Abilify  Trial Vraylar to see if wt loss can occur.  1.5 mg daily.  11/07/23 appt noted: Meds:  alprazolam  XR 0.5 mg HS and prn, paroxetine  80, topiramate  100 mg BID, trazodone  100 mg HS, Ambien  10 HS.  Abilify  10 Anxiety up for a month.  Was triggered.  F with health issues.  F can't accept hi slimitations and gets himself in trouble physically.  Spends a lot of nights  sitting up worrying.  Also stress D and her BF keeping house in a mess.   1 full panic about 3 weeks ago.  At home more and it's good.   Job of on call 3 weeks.  Doesn't take Ambien  then.   Anxiety interfering with sleep but per H does better sleeping during the day.  Brain won't turn off without something to help her sleep.   Stays tired a lot.   Wonders about FM.  Avg 4-5 hours sleep.   No SE.  No excessive sleepiness with Xanax   and sometimes only 2 daily.  Past Psychiatric Medication Trials: Citalopram, fluoxetine, fluvoxamine, and Anafranil, paroxetine  80, duloxetine Zoloft during pregnancy Abilify  10 Buspirone Topiramate  Ambien , Ambien  CR, trazodone  Xanax  XR Phentermine  anxiety  Review of Systems:  Review of Systems  Constitutional:  Positive for fatigue.  Musculoskeletal:  Negative for arthralgias.       Pending shoulder surgery  Neurological:  Negative for dizziness and tremors.  Psychiatric/Behavioral:  Negative for agitation, behavioral problems, confusion, decreased concentration, dysphoric mood, hallucinations, self-injury and suicidal ideas. The patient is nervous/anxious. The  patient is not hyperactive.     Medications: I have reviewed the patient's current medications.  Current Outpatient Medications  Medication Sig Dispense Refill   acetaminophen  (TYLENOL ) 500 MG tablet Take 1,000 mg by mouth every 6 (six) hours as needed for moderate pain.     alendronate  (FOSAMAX ) 70 MG tablet Take 1 tablet (70 mg total) by mouth every 7 (seven) days. Take with a full glass of water on an empty stomach. 12 tablet 3   ALPRAZolam  (ALPRAZOLAM  XR) 0.5 MG 24 hr tablet Take 1 tablet (0.5 mg total) by mouth in the morning and 2 tablets (1 mg total) by mouth at night. 270 tablet 1   AMBULATORY NON FORMULARY MEDICATION Medication Name: Diltiazem 2% compound with Lidocaine  5% 30 g 0   ARIPiprazole  (ABILIFY ) 10 MG tablet Take 1 tablet (10 mg total) by mouth daily. 90 tablet 0   azelastine   (ASTELIN ) 0.1 % nasal spray Place 1 spray into both nostrils 2 (two) times daily. Use in each nostril as directed 30 mL 12   Calcium Carb-Cholecalciferol  (CALCIUM 600+D) 600-800 MG-UNIT TABS Take 2 tablets by mouth daily.     celecoxib  (CELEBREX ) 100 MG capsule Take 1 capsule (100 mg total) by mouth 2 (two) times daily. 60 capsule 5   fluticasone  (FLONASE ) 50 MCG/ACT nasal spray Place 2 sprays into both nostrils daily. 16 g 6   ibuprofen  (ADVIL ) 200 MG tablet Take 800 mg by mouth every 6 (six) hours as needed for moderate pain or headache.     Melatonin 5 MG CHEW Chew 10 mg by mouth daily.     meloxicam  (MOBIC ) 15 MG tablet Take 1 tablet (15 mg total) by mouth daily with food 30 tablet 3   PARoxetine  (PAXIL ) 40 MG tablet Take 2 tablets (80 mg total) by mouth at bedtime. 180 tablet 0   polyethylene glycol (MIRALAX  / GLYCOLAX ) 17 g packet Take 17 g by mouth daily.     propranolol  (INDERAL ) 20 MG tablet Take 1-2 tablets (20-40 mg total) by mouth daily as needed (anxiety). 180 tablet 0   topiramate  (TOPAMAX ) 50 MG tablet Take 2 tablets (100 mg total) by mouth 2 (two) times daily. 360 tablet 1   traZODone  (DESYREL ) 50 MG tablet Take 2 tablets (100 mg total) by mouth at bedtime. 180 tablet 0   triamcinolone  cream (KENALOG ) 0.1 % Apply 1 Application topically 2 (two) times daily. 30 g 0   zolpidem  (AMBIEN ) 10 MG tablet Take 1 tablet (10 mg total) by mouth at bedtime as needed for sleep. 90 tablet 1   No current facility-administered medications for this visit.    Medication Side Effects: None  Allergies: No Known Allergies  Past Medical History:  Diagnosis Date   Anxiety    Arthritis    right shoulder   Back pain 12/13/2011   Bursitis    Constipation    Depression    Fatty liver    seen on US  04-2018   HLD (hyperlipidemia)    Hyperglycemia 03/05/2013   Hypotension    Incontinence    Lower extremity edema    Lumbar stress fracture    Obesity    Osteoporosis    SOB (shortness of  breath)     Family History  Problem Relation Age of Onset   Colon polyps Mother    Thyroid  disease Mother    Hyperlipidemia Mother    Diabetes Mother    Hypertension Mother    Diabetes Father    Hypertension Father  Hyperlipidemia Father    Mental illness Sister    Diabetes Paternal Aunt    Hypertension Paternal Uncle    Diabetes Paternal Grandmother    Hypertension Paternal Grandmother    Hyperlipidemia Paternal Grandmother    Colon cancer Neg Hx    Esophageal cancer Neg Hx    Rectal cancer Neg Hx    Stomach cancer Neg Hx     Social History   Socioeconomic History   Marital status: Married    Spouse name: John   Number of children: Not on file   Years of education: 16   Highest education level: Associate degree: occupational, Scientist, product/process development, or vocational program  Occupational History   Occupation: RN/short stay    Employer: [employer]  Tobacco Use   Smoking status: Never   Smokeless tobacco: Never  Vaping Use   Vaping status: Never Used  Substance and Sexual Activity   Alcohol use: No    Alcohol/week: 0.0 standard drinks of alcohol   Drug use: No   Sexual activity: Yes    Partners: Male    Birth control/protection: Surgical    Comment: Husband's vasectomy  Other Topics Concern   Not on file  Social History Narrative   Not on file   Social Drivers of Health   Financial Resource Strain: Low Risk  (11/07/2023)   Overall Financial Resource Strain (CARDIA)    Difficulty of Paying Living Expenses: Not hard at all  Food Insecurity: No Food Insecurity (11/07/2023)   Hunger Vital Sign    Worried About Running Out of Food in the Last Year: Never true    Ran Out of Food in the Last Year: Never true  Transportation Needs: No Transportation Needs (11/07/2023)   PRAPARE - Administrator, Civil Service (Medical): No    Lack of Transportation (Non-Medical): No  Physical Activity: Inactive (11/07/2023)   Exercise Vital Sign    Days of Exercise per Week: 0 days     Minutes of Exercise per Session: Not on file  Stress: No Stress Concern Present (11/07/2023)   [employer] of Occupational Health - Occupational Stress Questionnaire    Feeling of Stress: Not at all  Social Connections: Socially Integrated (11/07/2023)   Social Connection and Isolation Panel    Frequency of Communication with Friends and Family: More than three times a week    Frequency of Social Gatherings with Friends and Family: Three times a week    Attends Religious Services: 1 to 4 times per year    Active Member of Clubs or Organizations: Yes    Attends Banker Meetings: 1 to 4 times per year    Marital Status: Married  Catering manager Violence: Not on file    Past Medical History, Surgical history, Social history, and Family history were reviewed and updated as appropriate.   Please see review of systems for further details on the patient's review from today.   Objective:   Physical Exam:  LMP 04/03/2018   Physical Exam Constitutional:      General: She is not in acute distress.    Appearance: Normal appearance.  Musculoskeletal:        General: No deformity.  Neurological:     Mental Status: She is alert and oriented to person, place, and time.     Coordination: Coordination normal.     Gait: Gait normal.  Psychiatric:        Attention and Perception: She is attentive.  Mood and Affect: Mood is anxious. Mood is not depressed. Affect is not labile, blunt, angry or inappropriate.        Speech: Speech normal.        Behavior: Behavior normal.        Thought Content: Thought content normal. Thought content is not delusional. Thought content does not include homicidal or suicidal ideation. Thought content does not include suicidal plan.        Cognition and Memory: Cognition normal.        Judgment: Judgment normal.     Comments: Insight intact. No auditory or visual hallucinations. No delusions.  Anxiety worse with stress     Lab Review:      Component Value Date/Time   NA 142 12/31/2021 1405   NA 144 08/06/2021 1022   K 3.9 12/31/2021 1405   CL 108 12/31/2021 1405   CO2 29 12/31/2021 1405   GLUCOSE 55 (L) 12/31/2021 1405   BUN 17 12/31/2021 1405   BUN 14 08/06/2021 1022   CREATININE 0.93 12/31/2021 1405   CREATININE 1.10 (H) 12/18/2021 1601   CALCIUM 9.2 12/31/2021 1405   PROT 6.2 12/31/2021 1405   PROT 6.1 08/06/2021 1022   ALBUMIN 4.1 12/31/2021 1405   ALBUMIN 4.3 08/06/2021 1022   AST 20 12/31/2021 1405   ALT 23 12/31/2021 1405   ALKPHOS 111 12/31/2021 1405   BILITOT 0.3 12/31/2021 1405   BILITOT 0.2 08/06/2021 1022   GFRNONAA >60 11/19/2020 0907   GFRAA 89 04/08/2020 0851       Component Value Date/Time   WBC 4.8 12/31/2021 1405   RBC 4.68 12/31/2021 1405   HGB 14.3 12/31/2021 1405   HGB 14.7 08/06/2021 1022   HCT 43.5 12/31/2021 1405   HCT 44.5 08/06/2021 1022   PLT 238.0 12/31/2021 1405   PLT 210 08/06/2021 1022   MCV 92.9 12/31/2021 1405   MCV 93 08/06/2021 1022   MCH 31.9 12/18/2021 1601   MCHC 33.0 12/31/2021 1405   RDW 13.1 12/31/2021 1405   RDW 12.7 08/06/2021 1022   LYMPHSABS 1.5 12/31/2021 1405   LYMPHSABS 1.2 08/06/2021 1022   MONOABS 0.3 12/31/2021 1405   EOSABS 0.1 12/31/2021 1405   EOSABS 0.0 08/06/2021 1022   BASOSABS 0.0 12/31/2021 1405   BASOSABS 0.0 08/06/2021 1022    No results found for: POCLITH, LITHIUM   No results found for: PHENYTOIN, PHENOBARB, VALPROATE, CBMZ   .res Assessment: Plan:    Generalized anxiety disorder  Panic disorder with agoraphobia  Insomnia due to mental condition  Recurrent major depression in partial remission (HCC)   Overall med combo has worked well for depression and anxiety but is better than last time...  Disc options for benefit more: increase Abilify , topiramate  off label, prn propranolol . Disc SE in detail of each option.   Doing ok without panic with less Xanax  XR for now.  Supportive therapy dealing with D with  psych px who is dependent on the familyand having another baby.  Discussed potential metabolic side effects associated with atypical antipsychotics, as well as potential risk for movement side effects. Advised pt to contact office if movement side effects occur.  No AIM  We discussed the short-term risks associated with benzodiazepines including sedation and increased fall risk among others.  Discussed long-term side effect risk including dependence, potential withdrawal symptoms, and the potential eventual dose-related risk of dementia.  Newer studies published 2020 dispute risk of dementia with benzodiazepines.  And disc risk of Ambien  amnesia.  Consider switch to Ativan from Xanax  to reduce the sedation from Bz.    Better self care and more sleep would help. Protect SLEEP.  Disc difficulty with night shift.   Disc borderline low vitamin D  levels and psych literature and immune studies suggesting goal of mid range vit d levels.  continue topiramate  bc help weight gain risk to 200mg  daily.  Disc SE.  Rx prn propranolol  20-40 mg BID for anxiety.   Disc polypharmacy is medcially necessary and has been helpful.  It has taken a lot of meds and combination of mechanisms in order to manage her depression and anxiety symptoms.  She tolerates the higher dose of paroxetine  without side effects.  Panic was not managed with less paroxetine . Disc purpose of each med.  Disc SE in detail and SSRI withdrawal sx. Discussed serotonin syndrome.  Avoid dextromethorphan, tramadol  which can increase risk. increase alprazolam  XR 0.5 mg every 12 hours Continue paroxetine  80 mg daily Continue Abilify  10.  Consider increase if increasing XAnax  Continue propranolol  20 mg tablets 1-2 twice daily as needed anxiety Continue topiramate  50 mg tablets 2 twice daily Continue trazodone  100 mg nightly Continue zolpidem  5 to 10 mg nightly as needed insomnia.  Don't take when on call. She typically will alternate trazodone   or Ambien  depending on her work schedule.  This appt was 30 mins.  FU 2  mos  Lorene Macintosh, MD, DFAPA     Please see After Visit Summary for patient specific instructions.  Future Appointments  Date Time Provider Department Center  11/08/2023  6:00 PM [place], Vernon R, DO [id number]     No orders of the defined types were placed in this encounter.     -------------------------------

## 2023-11-08 ENCOUNTER — Encounter: Payer: Self-pay | Admitting: Family Medicine

## 2023-11-08 ENCOUNTER — Ambulatory Visit: Admitting: Family Medicine

## 2023-11-08 ENCOUNTER — Other Ambulatory Visit (HOSPITAL_BASED_OUTPATIENT_CLINIC_OR_DEPARTMENT_OTHER): Payer: Self-pay

## 2023-11-08 VITALS — BP 104/66 | HR 92 | Ht 63.0 in | Wt 177.2 lb

## 2023-11-08 DIAGNOSIS — K760 Fatty (change of) liver, not elsewhere classified: Secondary | ICD-10-CM | POA: Insufficient documentation

## 2023-11-08 DIAGNOSIS — M2550 Pain in unspecified joint: Secondary | ICD-10-CM | POA: Diagnosis not present

## 2023-11-08 DIAGNOSIS — M25551 Pain in right hip: Secondary | ICD-10-CM | POA: Diagnosis not present

## 2023-11-08 DIAGNOSIS — M25552 Pain in left hip: Secondary | ICD-10-CM | POA: Diagnosis not present

## 2023-11-08 DIAGNOSIS — R0683 Snoring: Secondary | ICD-10-CM | POA: Diagnosis not present

## 2023-11-08 MED ORDER — CELECOXIB 100 MG PO CAPS
100.0000 mg | ORAL_CAPSULE | Freq: Two times a day (BID) | ORAL | 5 refills | Status: AC
  Filled 2023-11-08: qty 60, 30d supply, fill #0
  Filled 2023-12-27: qty 60, 30d supply, fill #1
  Filled 2024-02-06: qty 60, 30d supply, fill #2
  Filled 2024-04-02: qty 60, 30d supply, fill #3
  Filled 2024-05-22: qty 60, 30d supply, fill #4

## 2023-11-08 MED ORDER — ZEPBOUND 2.5 MG/0.5ML ~~LOC~~ SOAJ
2.5000 mg | SUBCUTANEOUS | 0 refills | Status: DC
  Filled 2023-11-08: qty 2, 28d supply, fill #0

## 2023-11-08 NOTE — Progress Notes (Signed)
 Established Patient Office Visit  Subjective   Patient ID: Brooke [name], female    DOB: Jun 27, 1970  Age: 53 y.o. MRN: [id number]  Chief Complaint  Patient presents with   Fatigue   Temporomandibular Joint Pain    Fatigue onset a couple of years Joint aches and pain onset a couple of years Pt is concerned with Fibromyalgia     HPI Pt is here with her husband c/o extreme fatigue and joint pains. X few years.  Her psych feels it is extreme exhaustion due to lack of sleep and working too much.  She ave 4-5 hrs sleep at night.  Her husband states she snores at night and sometimes sleeps on her back in fetal position.    Patient Active Problem List   Diagnosis Date Noted   Multiple joint pain 11/08/2023   Fatty liver 11/08/2023   Snoring 11/08/2023   Influenza A 06/14/2023   RUQ abdominal pain 12/18/2021   Vitamin D  deficiency 06/04/2020   Mixed hyperlipidemia 06/04/2020   Polyphagia, controlled with Wegovy  06/04/2020   Chronic low blood pressure 06/04/2020   Bilateral hip pain 04/11/2019   GAD (generalized anxiety disorder) 04/17/2018   Panic disorder with agoraphobia 04/17/2018   Recurrent UTI 02/06/2014   Osteoporosis 01/10/2014   Urinary frequency 09/04/2012   Sleep disturbance 12/13/2011   FEMALE STRESS INCONTINENCE 09/05/2009   DEPRESSION 05/31/2008   Past Medical History:  Diagnosis Date   Anxiety    Arthritis    right shoulder   Back pain 12/13/2011   Bursitis    Constipation    Depression    Fatty liver    seen on US  04-2018   HLD (hyperlipidemia)    Hyperglycemia 03/05/2013   Hypotension    Incontinence    Lower extremity edema    Lumbar stress fracture    Obesity    Osteoporosis    SOB (shortness of breath)    Past Surgical History:  Procedure Laterality Date   BREAST BIOPSY Right 12/27/2018   NEG   COLONOSCOPY     EXCISION/RELEASE BURSA HIP Right 04/11/2019   Procedure: Right hip bursectomy;  Surgeon: Heide Ingle, MD;  Location:  [facility];  Service: Orthopedics;  Laterality: Right;    RHINOPLASTY     SINUS ENDO W/FUSION     TONSILLECTOMY     Social History   Tobacco Use   Smoking status: Never   Smokeless tobacco: Never  Vaping Use   Vaping status: Never Used  Substance Use Topics   Alcohol use: No    Alcohol/week: 0.0 standard drinks of alcohol   Drug use: No   Social History   Socioeconomic History   Marital status: Married    Spouse name: John   Number of children: Not on file   Years of education: 16   Highest education level: Associate degree: occupational, Scientist, product/process development, or vocational program  Occupational History   Occupation: RN/short stay    Employer: [employer]  Tobacco Use   Smoking status: Never   Smokeless tobacco: Never  Vaping Use   Vaping status: Never Used  Substance and Sexual Activity   Alcohol use: No    Alcohol/week: 0.0 standard drinks of alcohol   Drug use: No   Sexual activity: Yes    Partners: Male    Birth control/protection: Surgical    Comment: Husband's vasectomy  Other Topics Concern   Not on file  Social History Narrative   Not on file   Social Drivers  of Health   Financial Resource Strain: Low Risk  (11/07/2023)   Overall Financial Resource Strain (CARDIA)    Difficulty of Paying Living Expenses: Not hard at all  Food Insecurity: No Food Insecurity (11/07/2023)   Hunger Vital Sign    Worried About Running Out of Food in the Last Year: Never true    Ran Out of Food in the Last Year: Never true  Transportation Needs: No Transportation Needs (11/07/2023)   PRAPARE - Administrator, Civil Service (Medical): No    Lack of Transportation (Non-Medical): No  Physical Activity: Inactive (11/07/2023)   Exercise Vital Sign    Days of Exercise per Week: 0 days    Minutes of Exercise per Session: Not on file  Stress: No Stress Concern Present (11/07/2023)   [employer] of Occupational Health - Occupational Stress Questionnaire    Feeling of Stress:  Not at all  Social Connections: Socially Integrated (11/07/2023)   Social Connection and Isolation Panel    Frequency of Communication with Friends and Family: More than three times a week    Frequency of Social Gatherings with Friends and Family: Three times a week    Attends Religious Services: 1 to 4 times per year    Active Member of Clubs or Organizations: Yes    Attends Banker Meetings: 1 to 4 times per year    Marital Status: Married  Catering manager Violence: Not on file   Family Status  Relation Name Status   Mother  Alive   Father  Alive   Sister  Alive   Sister  Deceased   Brother  Alive   Brother  Alive   Oceanographer  (Not Specified)   Nutritional therapist  (Not Specified)   PGM  Alive   PGF  Deceased   Neg Hx  (Not Specified)  No partnership data on file   Family History  Problem Relation Age of Onset   Colon polyps Mother    Thyroid  disease Mother    Hyperlipidemia Mother    Diabetes Mother    Hypertension Mother    Diabetes Father    Hypertension Father    Hyperlipidemia Father    Mental illness Sister    Diabetes Paternal Aunt    Hypertension Paternal Uncle    Diabetes Paternal Grandmother    Hypertension Paternal Grandmother    Hyperlipidemia Paternal Grandmother    Colon cancer Neg Hx    Esophageal cancer Neg Hx    Rectal cancer Neg Hx    Stomach cancer Neg Hx    No Known Allergies    Review of Systems  Constitutional:  Positive for malaise/fatigue. Negative for fever.  HENT:  Negative for congestion.   Eyes:  Negative for blurred vision.  Respiratory:  Negative for cough and shortness of breath.   Cardiovascular:  Negative for chest pain, palpitations and leg swelling.  Gastrointestinal:  Negative for abdominal pain, blood in stool, nausea and vomiting.  Genitourinary:  Negative for dysuria and frequency.  Musculoskeletal:  Positive for joint pain and myalgias. Negative for back pain and falls.  Skin:  Negative for rash.  Neurological:   Negative for dizziness, loss of consciousness and headaches.  Endo/Heme/Allergies:  Negative for environmental allergies.  Psychiatric/Behavioral:  Negative for depression. The patient is not nervous/anxious.       Objective:     BP 104/66 (BP Location: Left Arm, Patient Position: Sitting, Cuff Size: Normal)   Pulse 92   Ht 5'  3 (1.6 m)   Wt 177 lb 3.2 oz (80.4 kg)   LMP 04/03/2018   SpO2 99%   BMI 31.39 kg/m  BP Readings from Last 3 Encounters:  11/08/23 104/66  08/26/23 106/71  06/14/23 100/70   Wt Readings from Last 3 Encounters:  11/08/23 177 lb 3.2 oz (80.4 kg)  08/26/23 167 lb (75.8 kg)  06/14/23 164 lb 3.2 oz (74.5 kg)   SpO2 Readings from Last 3 Encounters:  11/08/23 99%  06/14/23 96%  06/12/23 98%      Physical Exam Vitals and nursing note reviewed.  Constitutional:      General: She is not in acute distress.    Appearance: Normal appearance. She is well-developed.  HENT:     Head: Normocephalic and atraumatic.  Eyes:     General: No scleral icterus.       Right eye: No discharge.        Left eye: No discharge.  Cardiovascular:     Rate and Rhythm: Normal rate and regular rhythm.     Heart sounds: No murmur heard. Pulmonary:     Effort: Pulmonary effort is normal. No respiratory distress.     Breath sounds: Normal breath sounds.  Musculoskeletal:        General: Normal range of motion.     Cervical back: Normal range of motion and neck supple.     Right lower leg: No edema.     Left lower leg: No edema.  Skin:    General: Skin is warm and dry.  Neurological:     Mental Status: She is alert and oriented to person, place, and time.  Psychiatric:        Mood and Affect: Mood normal.        Behavior: Behavior normal.        Thought Content: Thought content normal.        Judgment: Judgment normal.      No results found for any visits on 11/08/23.  Last CBC Lab Results  Component Value Date   WBC 4.8 12/31/2021   HGB 14.3 12/31/2021    HCT 43.5 12/31/2021   MCV 92.9 12/31/2021   MCH 31.9 12/18/2021   RDW 13.1 12/31/2021   PLT 238.0 12/31/2021   Last metabolic panel Lab Results  Component Value Date   GLUCOSE 55 (L) 12/31/2021   NA 142 12/31/2021   K 3.9 12/31/2021   CL 108 12/31/2021   CO2 29 12/31/2021   BUN 17 12/31/2021   CREATININE 0.93 12/31/2021   GFR 70.30 12/31/2021   CALCIUM 9.2 12/31/2021   PHOS 2.6 01/10/2014   PROT 6.2 12/31/2021   ALBUMIN 4.1 12/31/2021   LABGLOB 1.8 08/06/2021   AGRATIO 2.4 (H) 08/06/2021   BILITOT 0.3 12/31/2021   ALKPHOS 111 12/31/2021   AST 20 12/31/2021   ALT 23 12/31/2021   ANIONGAP 7 11/19/2020   Last lipids Lab Results  Component Value Date   CHOL 220 (H) 12/31/2021   HDL 48.80 12/31/2021   LDLCALC 133 (H) 12/31/2021   LDLDIRECT 130.6 09/05/2009   TRIG 193.0 (H) 12/31/2021   CHOLHDL 5 12/31/2021   Last hemoglobin A1c Lab Results  Component Value Date   HGBA1C 5.4 12/31/2021   Last thyroid  functions Lab Results  Component Value Date   TSH 2.08 12/31/2021   Last vitamin D  Lab Results  Component Value Date   VD25OH 32.3 07/08/2021   Last vitamin B12 and Folate Lab Results  Component Value Date  VITAMINB12 600 07/08/2021      The 10-year ASCVD risk score (Arnett DK, et al., 2019) is: 1.6%    Assessment & Plan:   Problem List Items Addressed This Visit       Unprioritized   Snoring   Refer to neuro for sleep study -- r/o osa       Relevant Orders   Ambulatory referral to Neurology   Multiple joint pain - Primary   Check labs  Celebrex  100 mg bid as needed        Relevant Orders   CBC with Differential/Platelet   Comprehensive metabolic panel with GFR   Lipid panel   TSH   Rheumatoid factor   Sedimentation rate   Antinuclear Antib (ANA)   Epstein-Barr virus VCA antibody panel   Fatty liver   Will try to get zepbound  for pt       Relevant Medications   tirzepatide  (ZEPBOUND ) 2.5 MG/0.5ML Pen   Bilateral hip pain    Bursitis ==  ortho gave pt injection       Relevant Medications   celecoxib  (CELEBREX ) 100 MG capsule    No follow-ups on file.    Kadince Boxley R Lowne Chase, DO

## 2023-11-08 NOTE — Assessment & Plan Note (Signed)
 Will try to get zepbound  for pt

## 2023-11-08 NOTE — Assessment & Plan Note (Signed)
 Refer to neuro for sleep study -- r/o osa

## 2023-11-08 NOTE — Assessment & Plan Note (Signed)
 Bursitis ==  ortho gave pt injection

## 2023-11-08 NOTE — Assessment & Plan Note (Signed)
 Check labs  Celebrex  100 mg bid as needed

## 2023-11-09 ENCOUNTER — Other Ambulatory Visit (HOSPITAL_BASED_OUTPATIENT_CLINIC_OR_DEPARTMENT_OTHER): Payer: Self-pay

## 2023-11-09 LAB — CBC WITH DIFFERENTIAL/PLATELET
Basophils Absolute: 0 K/uL (ref 0.0–0.1)
Basophils Relative: 0.7 % (ref 0.0–3.0)
Eosinophils Absolute: 0.2 K/uL (ref 0.0–0.7)
Eosinophils Relative: 3.4 % (ref 0.0–5.0)
HCT: 42.9 % (ref 36.0–46.0)
Hemoglobin: 14.1 g/dL (ref 12.0–15.0)
Lymphocytes Relative: 39.4 % (ref 12.0–46.0)
Lymphs Abs: 1.9 K/uL (ref 0.7–4.0)
MCHC: 33 g/dL (ref 30.0–36.0)
MCV: 90 fl (ref 78.0–100.0)
Monocytes Absolute: 0.3 K/uL (ref 0.1–1.0)
Monocytes Relative: 6.5 % (ref 3.0–12.0)
Neutro Abs: 2.4 K/uL (ref 1.4–7.7)
Neutrophils Relative %: 50 % (ref 43.0–77.0)
Platelets: 226 K/uL (ref 150.0–400.0)
RBC: 4.76 Mil/uL (ref 3.87–5.11)
RDW: 13.4 % (ref 11.5–15.5)
WBC: 4.8 K/uL (ref 4.0–10.5)

## 2023-11-09 LAB — SEDIMENTATION RATE: Sed Rate: 1 mm/h (ref 0–30)

## 2023-11-09 LAB — COMPREHENSIVE METABOLIC PANEL WITH GFR
ALT: 24 U/L (ref 0–35)
AST: 24 U/L (ref 0–37)
Albumin: 4.3 g/dL (ref 3.5–5.2)
Alkaline Phosphatase: 95 U/L (ref 39–117)
BUN: 17 mg/dL (ref 6–23)
CO2: 28 meq/L (ref 19–32)
Calcium: 8.8 mg/dL (ref 8.4–10.5)
Chloride: 106 meq/L (ref 96–112)
Creatinine, Ser: 0.94 mg/dL (ref 0.40–1.20)
GFR: 68.51 mL/min (ref >60.00)
Glucose, Bld: 78 mg/dL (ref 70–99)
Potassium: 3.6 meq/L (ref 3.5–5.1)
Sodium: 139 meq/L (ref 135–145)
Total Bilirubin: 0.3 mg/dL (ref 0.2–1.2)
Total Protein: 6.5 g/dL (ref 6.0–8.3)

## 2023-11-09 LAB — LDL CHOLESTEROL, DIRECT: Direct LDL: 85 mg/dL

## 2023-11-09 LAB — LIPID PANEL
Cholesterol: 197 mg/dL (ref 0–200)
HDL: 37.5 mg/dL — ABNORMAL LOW (ref >39.00)
NonHDL: 159.76
Total CHOL/HDL Ratio: 5
Triglycerides: 416 mg/dL — ABNORMAL HIGH (ref 0.0–149.0)
VLDL: 83.2 mg/dL — ABNORMAL HIGH (ref 0.0–40.0)

## 2023-11-09 LAB — TSH: TSH: 3.1 u[IU]/mL (ref 0.35–5.50)

## 2023-11-10 ENCOUNTER — Ambulatory Visit: Payer: Self-pay | Admitting: Family Medicine

## 2023-11-11 ENCOUNTER — Other Ambulatory Visit: Payer: Self-pay

## 2023-11-11 ENCOUNTER — Ambulatory Visit (INDEPENDENT_AMBULATORY_CARE_PROVIDER_SITE_OTHER)

## 2023-11-11 DIAGNOSIS — E559 Vitamin D deficiency, unspecified: Secondary | ICD-10-CM | POA: Diagnosis not present

## 2023-11-11 LAB — EPSTEIN-BARR VIRUS VCA ANTIBODY PANEL
EBV NA IgG: 184 U/mL — ABNORMAL HIGH
EBV VCA IgG: 568 U/mL — ABNORMAL HIGH
EBV VCA IgM: <36 U/mL

## 2023-11-11 LAB — ANTI-NUCLEAR AB-TITER (ANA TITER)
ANA TITER: 1:40 {titer} — ABNORMAL HIGH
ANA Titer 1: 1:40 {titer} — ABNORMAL HIGH

## 2023-11-11 LAB — ANA: Anti Nuclear Antibody (ANA): POSITIVE — AB

## 2023-11-11 LAB — VITAMIN D 25 HYDROXY (VIT D DEFICIENCY, FRACTURES): VITD: 28.98 ng/mL — ABNORMAL LOW (ref 30.00–100.00)

## 2023-11-11 LAB — RHEUMATOID FACTOR: Rheumatoid fact SerPl-aCnc: <10 [IU]/mL (ref <14)

## 2023-11-14 ENCOUNTER — Other Ambulatory Visit (HOSPITAL_BASED_OUTPATIENT_CLINIC_OR_DEPARTMENT_OTHER): Payer: Self-pay

## 2023-11-15 ENCOUNTER — Other Ambulatory Visit: Payer: Self-pay

## 2023-11-15 ENCOUNTER — Other Ambulatory Visit (HOSPITAL_BASED_OUTPATIENT_CLINIC_OR_DEPARTMENT_OTHER): Payer: Self-pay

## 2023-11-15 MED ORDER — VITAMIN D (ERGOCALCIFEROL) 1.25 MG (50000 UNIT) PO CAPS
50000.0000 [IU] | ORAL_CAPSULE | ORAL | 1 refills | Status: AC
  Filled 2023-11-15: qty 12, 84d supply, fill #0
  Filled 2024-02-06: qty 12, 84d supply, fill #1

## 2023-11-18 ENCOUNTER — Encounter: Payer: Self-pay | Admitting: Advanced Practice Midwife

## 2023-11-23 ENCOUNTER — Encounter: Payer: Self-pay | Admitting: Family Medicine

## 2023-12-01 ENCOUNTER — Other Ambulatory Visit (HOSPITAL_BASED_OUTPATIENT_CLINIC_OR_DEPARTMENT_OTHER): Payer: Self-pay

## 2023-12-01 ENCOUNTER — Other Ambulatory Visit: Payer: Self-pay

## 2023-12-05 ENCOUNTER — Other Ambulatory Visit (HOSPITAL_BASED_OUTPATIENT_CLINIC_OR_DEPARTMENT_OTHER): Payer: Self-pay

## 2023-12-05 ENCOUNTER — Other Ambulatory Visit: Payer: Self-pay | Admitting: Psychiatry

## 2023-12-05 DIAGNOSIS — F3342 Major depressive disorder, recurrent, in full remission: Secondary | ICD-10-CM

## 2023-12-06 ENCOUNTER — Institutional Professional Consult (permissible substitution): Admitting: Neurology

## 2023-12-06 ENCOUNTER — Other Ambulatory Visit: Payer: Self-pay

## 2023-12-06 ENCOUNTER — Other Ambulatory Visit (HOSPITAL_BASED_OUTPATIENT_CLINIC_OR_DEPARTMENT_OTHER): Payer: Self-pay

## 2023-12-06 ENCOUNTER — Telehealth: Payer: Self-pay

## 2023-12-06 ENCOUNTER — Telehealth: Payer: Self-pay | Admitting: Neurology

## 2023-12-06 MED ORDER — ARIPIPRAZOLE 10 MG PO TABS
10.0000 mg | ORAL_TABLET | Freq: Every day | ORAL | 0 refills | Status: DC
  Filled 2023-12-06: qty 60, 60d supply, fill #0

## 2023-12-06 NOTE — Telephone Encounter (Signed)
 Left voicemail to call office back to see if referral was completed.   Curtistine Quiet, CMA

## 2023-12-06 NOTE — Telephone Encounter (Signed)
 Pt husband called stating that Pt is feeling sick will not be  able to make appt   Appt cancel

## 2023-12-13 ENCOUNTER — Ambulatory Visit: Admitting: Physician Assistant

## 2023-12-13 ENCOUNTER — Other Ambulatory Visit (HOSPITAL_BASED_OUTPATIENT_CLINIC_OR_DEPARTMENT_OTHER): Payer: Self-pay

## 2023-12-13 ENCOUNTER — Ambulatory Visit: Payer: Self-pay

## 2023-12-13 ENCOUNTER — Encounter: Payer: Self-pay | Admitting: Physician Assistant

## 2023-12-13 VITALS — BP 102/78 | HR 109 | Temp 98.2°F | Resp 18 | Ht 63.0 in | Wt 174.0 lb

## 2023-12-13 DIAGNOSIS — N3000 Acute cystitis without hematuria: Secondary | ICD-10-CM

## 2023-12-13 DIAGNOSIS — R509 Fever, unspecified: Secondary | ICD-10-CM | POA: Diagnosis not present

## 2023-12-13 DIAGNOSIS — R0981 Nasal congestion: Secondary | ICD-10-CM | POA: Diagnosis not present

## 2023-12-13 DIAGNOSIS — R350 Frequency of micturition: Secondary | ICD-10-CM

## 2023-12-13 LAB — POCT URINALYSIS DIPSTICK
Bilirubin, UA: NEGATIVE
Blood, UA: NEGATIVE
Glucose, UA: NEGATIVE
Ketones, UA: NEGATIVE
Nitrite, UA: NEGATIVE
Protein, UA: NEGATIVE
Spec Grav, UA: <=1.005 — AB (ref 1.010–1.025)
Urobilinogen, UA: 0.2 U/dL
pH, UA: 6 (ref 5.0–8.0)

## 2023-12-13 LAB — POC COVID19 BINAXNOW: SARS Coronavirus 2 Ag: NEGATIVE

## 2023-12-13 MED ORDER — AZELASTINE HCL 0.1 % NA SOLN
1.0000 | Freq: Two times a day (BID) | NASAL | 12 refills | Status: DC
  Filled 2023-12-13: qty 30, 30d supply, fill #0
  Filled 2024-02-06: qty 30, 30d supply, fill #1

## 2023-12-13 MED ORDER — NITROFURANTOIN MONOHYD MACRO 100 MG PO CAPS
100.0000 mg | ORAL_CAPSULE | Freq: Two times a day (BID) | ORAL | 0 refills | Status: DC
  Filled 2023-12-13: qty 10, 5d supply, fill #0

## 2023-12-13 NOTE — Progress Notes (Signed)
 Established patient visit   Patient: Brooke [name]   DOB: 1970/07/30   53 y.o. Female  MRN: [id number] Visit Date: 12/13/2023  Today's healthcare provider: Manuelita Flatness, PA-C   Cc. Urinary frequency, chills, fever  Subjective     Discussed the use of AI scribe software for clinical note transcription with the patient, who gave verbal consent to proceed.  History of Present Illness   Brooke [name] is a 53 year old female who presents with fever and fatigue.  She has had a fever for three days, with tmax this weekend of 102F, and experiences significant fatigue.  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 Reports urinary frequency, but denies abdominal pain, new back pain, dysuria      Medications: Outpatient Medications Prior to Visit  Medication Sig   acetaminophen  (TYLENOL ) 500 MG tablet Take 1,000 mg by mouth every 6 (six) hours as needed for moderate pain.   alendronate  (FOSAMAX ) 70 MG tablet Take 1 tablet (70 mg total) by mouth every 7 (seven) days. Take with a full glass of water on an empty stomach.   ALPRAZolam  (ALPRAZOLAM  XR) 0.5 MG 24 hr tablet Take 1 tablet (0.5 mg total) by mouth in the morning and 2 tablets (1 mg total) by mouth at night.   AMBULATORY NON FORMULARY MEDICATION Medication Name: Diltiazem 2% compound with Lidocaine  5%   ARIPiprazole  (ABILIFY ) 10 MG tablet Take 1 tablet (10 mg total) by mouth daily.   Calcium Carb-Cholecalciferol  (CALCIUM 600+D) 600-800 MG-UNIT TABS Take 2 tablets by mouth daily.   celecoxib  (CELEBREX ) 100 MG capsule Take 1 capsule (100 mg total) by mouth 2 (two) times daily.   fluticasone  (FLONASE ) 50 MCG/ACT nasal spray Place 2 sprays into both nostrils daily. (Patient not taking: Reported on 11/08/2023)   ibuprofen  (ADVIL ) 200 MG tablet Take 800 mg by mouth every 6 (six) hours as needed  for moderate pain or headache.   Melatonin 5 MG CHEW Chew 10 mg by mouth daily.   PARoxetine  (PAXIL ) 40 MG tablet Take 2 tablets (80 mg total) by mouth at bedtime.   polyethylene glycol (MIRALAX  / GLYCOLAX ) 17 g packet Take 17 g by mouth daily. (Patient not taking: Reported on 11/08/2023)   propranolol  (INDERAL ) 20 MG tablet Take 1-2 tablets (20-40 mg total) by mouth daily as needed (anxiety).   tirzepatide  (ZEPBOUND ) 2.5 MG/0.5ML Pen Inject 2.5 mg into the skin once a week.   topiramate  (TOPAMAX ) 50 MG tablet Take 2 tablets (100 mg total) by mouth 2 (two) times daily.   traZODone  (DESYREL ) 50 MG tablet Take 2 tablets (100 mg total) by mouth at bedtime.   triamcinolone  cream (KENALOG ) 0.1 % Apply 1 Application topically 2 (two) times daily. (Patient not taking: Reported on 11/08/2023)   Vitamin D , Ergocalciferol , (DRISDOL ) 1.25 MG (50000 UNIT) CAPS capsule Take 1 capsule (50,000 Units total) by mouth every 7 (seven) days.   zolpidem  (AMBIEN ) 10 MG tablet Take 1 tablet (10 mg total) by mouth at bedtime as needed for sleep.   [DISCONTINUED] azelastine  (ASTELIN ) 0.1 % nasal spray Place 1 spray into both nostrils 2 (two) times daily. Use in each nostril as directed (Patient not taking: Reported on 11/08/2023)   No facility-administered medications prior to visit.    Review of Systems  Constitutional:  Positive for fatigue and fever.  Respiratory:  Negative  for cough and shortness of breath.   Cardiovascular:  Negative for chest pain and leg swelling.  Gastrointestinal:  Negative for abdominal pain.  Genitourinary:  Positive for frequency.  Neurological:  Negative for dizziness and headaches.       Objective    BP 102/78   Pulse (!) 109   Temp 98.2 F (36.8 C)   Resp 18   Ht 5' 3 (1.6 m)   Wt 174 lb (78.9 kg)   LMP 04/03/2018   SpO2 99%   BMI 30.82 kg/m    Physical Exam Constitutional:      General: She is awake.     Appearance: She is well-developed.  HENT:     Head:  Normocephalic.     Left Ear: Tympanic membrane normal.     Ears:     Comments: Right TM bulging with serous fluid no erythema Eyes:     Conjunctiva/sclera: Conjunctivae normal.  Cardiovascular:     Rate and Rhythm: Normal rate and regular rhythm.     Heart sounds: Normal heart sounds.  Pulmonary:     Effort: Pulmonary effort is normal.     Breath sounds: Normal breath sounds.  Skin:    General: Skin is warm.  Neurological:     Mental Status: She is alert and oriented to person, place, and time.  Psychiatric:        Attention and Perception: Attention normal.        Mood and Affect: Mood normal.        Speech: Speech normal.        Behavior: Behavior is cooperative.      Results for orders placed or performed in visit on 12/13/23  POCT Urinalysis Dipstick  Result Value Ref Range   Color, UA yellow    Clarity, UA clear    Glucose, UA Negative Negative   Bilirubin, UA negative    Ketones, UA negative    Spec Grav, UA <=1.005 (A) 1.010 - 1.025   Blood, UA negative    pH, UA 6.0 5.0 - 8.0   Protein, UA Negative Negative   Urobilinogen, UA 0.2 0.2 or 1.0 E.U./dL   Nitrite, UA negative    Leukocytes, UA Trace (A) Negative   Appearance     Odor    POC COVID-19  Result Value Ref Range   SARS Coronavirus 2 Ag Negative Negative    Assessment & Plan    Acute cystitis without hematuria -     Nitrofurantoin  Monohyd Macro; Take 1 capsule (100 mg total) by mouth 2 (two) times daily.  Dispense: 10 capsule; Refill: 0  Urinary frequency -     POCT urinalysis dipstick -     Urine Culture  Nasal congestion -     Azelastine  HCl; Place 1 spray into both nostrils 2 (two) times daily. Use in each nostril as directed  Dispense: 30 mL; Refill: 12  Fever, unspecified fever cause -     POC COVID-19 BinaxNow    UA only trace leuks, given fever sending for culture, will tx empirically  Macrobid  bid x 5 days Poc covid negative  UTI vs URI. Rest, fluids.   Return if symptoms  worsen or fail to improve.       Manuelita Flatness, PA-C  [facility] [phone] (phone) [phone] (fax)  [facility]

## 2023-12-13 NOTE — Telephone Encounter (Signed)
 FYI Only or Action Required?: FYI only for provider.  Patient was last seen in primary care on 11/08/2023 by Brooke [name], Brooke SAUNDERS, DO.  Called Nurse Triage reporting Chills, Fever, Urinary Urgency, and Urinary Frequency.  Symptoms began urinary symptoms x years, fever and chills since Sunday.  Interventions attempted: OTC medications: ibuprofen .  Symptoms are: fever (highest at 102, currently 100), chills, urinary frequency/urgency/incontinence gradually worsening.  Triage Disposition: See HCP Within 4 Hours (Or PCP Triage)  Patient/caregiver understands and will follow disposition?: Yes        Copied from CRM [id number]. Topic: Clinical - Red Word Triage >> Dec 13, 2023  8:23 AM Treva T wrote: Red Word that prompted transfer to Nurse Triage: Received call from patient reports chills, fever ranging from 99.5-100.2, with mentions of normal urinary issues, and frequency. Reason for Disposition  Fever > 100.4 F (38.0 C)  Answer Assessment - Initial Assessment Questions 1. SYMPTOM: What's the main symptom you're concerned about? (e.g., frequency, incontinence)     Urinary urgency, frequency, incontinence.  2. ONSET: When did the  urinary and frequency  start?     4 years. She states has been worsening the past couple of weeks.  3. PAIN: Is there any pain? If Yes, ask: How bad is it? (Scale: 1-10; mild, moderate, severe)     No.  4. CAUSE: What do you think is causing the symptoms?     Patient states she mentioned her urinary symptoms due to she is unsure what is causing her infection.  5. OTHER SYMPTOMS: Do you have any other symptoms? (e.g., blood in urine, fever, flank pain, pain with urination)     Fever (checked with oral thermometer 100 degrees at this time, 102 Sunday night), chills. Patient denies nausea, vomiting, abdominal pain, burning or painful urination, vaginal itching, cough, runny nose, headaches.  6. PREGNANCY: Is there any chance you are pregnant?  When was your last menstrual period?     N/A.  Protocols used: Urinary Symptoms-A-AH

## 2023-12-14 LAB — URINE CULTURE
MICRO NUMBER:: 16819654
Result:: NO GROWTH
SPECIMEN QUALITY:: ADEQUATE

## 2023-12-15 ENCOUNTER — Ambulatory Visit: Payer: Self-pay | Admitting: Physician Assistant

## 2023-12-26 ENCOUNTER — Other Ambulatory Visit (HOSPITAL_BASED_OUTPATIENT_CLINIC_OR_DEPARTMENT_OTHER): Payer: Self-pay

## 2023-12-26 MED ORDER — AMOXICILLIN 500 MG PO CAPS
500.0000 mg | ORAL_CAPSULE | Freq: Three times a day (TID) | ORAL | 1 refills | Status: DC
  Filled 2023-12-26: qty 30, 10d supply, fill #0
  Filled 2023-12-31 – 2024-01-07 (×2): qty 30, 10d supply, fill #1

## 2023-12-27 ENCOUNTER — Other Ambulatory Visit (HOSPITAL_BASED_OUTPATIENT_CLINIC_OR_DEPARTMENT_OTHER): Payer: Self-pay

## 2023-12-27 ENCOUNTER — Ambulatory Visit: Admitting: Neurology

## 2023-12-27 ENCOUNTER — Encounter: Payer: Self-pay | Admitting: Neurology

## 2023-12-27 VITALS — BP 128/85 | HR 97 | Ht 63.0 in | Wt 177.0 lb

## 2023-12-27 DIAGNOSIS — G479 Sleep disorder, unspecified: Secondary | ICD-10-CM

## 2023-12-27 DIAGNOSIS — F411 Generalized anxiety disorder: Secondary | ICD-10-CM

## 2023-12-27 DIAGNOSIS — G4719 Other hypersomnia: Secondary | ICD-10-CM

## 2023-12-27 DIAGNOSIS — G4726 Circadian rhythm sleep disorder, shift work type: Secondary | ICD-10-CM | POA: Diagnosis not present

## 2023-12-27 MED ORDER — ARMODAFINIL 200 MG PO TABS
200.0000 mg | ORAL_TABLET | Freq: Every day | ORAL | 0 refills | Status: DC
  Filled 2023-12-27: qty 30, 30d supply, fill #0

## 2023-12-27 NOTE — Patient Instructions (Addendum)
 Armodafinil  Tablets What is this medication? ARMODAFINIL  (ar moe DAF i nil) treats sleep disorders, such as narcolepsy, obstructive sleep apnea, and shift work disorder. It works by promoting wakefulness. It belongs to a group of medications called stimulants. This medicine may be used for other purposes; ask your health care provider or pharmacist if you have questions. COMMON BRAND NAME(S): Nuvigil  What should I tell my care team before I take this medication? They need to know if you have any of these conditions: Depression Heart disease High blood pressure Kidney disease Liver disease Schizophrenia Substance use disorder Suicidal thoughts, plans, or attempt by you or a family member An unusual or allergic reaction to armodafinil , other medications, foods, dyes, or preservatives Pregnant or trying to get pregnant Breast-feeding How should I use this medication? Take this medication by mouth with water. Take it as directed on the prescription label at the same time every day. Keep taking this medication unless your care team tells you to stop. Stopping it too quickly can cause serious side effects. It can also make your condition worse. A special MedGuide will be given to you by the pharmacist with each prescription and refill. Be sure to read this information carefully each time. Talk to your care team about the use of this medication in children. While this medication may be prescribed for children as young as 17 years for selected conditions, precautions do apply. Overdosage: If you think you have taken too much of this medicine contact a poison control center or emergency room at once. NOTE: This medicine is only for you. Do not share this medicine with others. What if I miss a dose? If you miss a dose, take it as soon as you can. If it is almost time for your next dose, take only that dose. Do not take double or extra doses. What may interact with this medication? Do not take  this medication with any of the following: Amphetamine or dextroamphetamine Dexmethylphenidate or methylphenidate MAOIs, such as Marplan, Nardil, and Parnate Pemoline Procarbazine This medication may also interact with the following: Antifungal medications, such as itraconazole or ketoconazole  Barbiturates, such as phenobarbital Carbamazepine Cyclosporine Diazepam Estrogen or progestin hormones Medications for mental health conditions Phenytoin Propranolol  Triazolam Warfarin This list may not describe all possible interactions. Give your health care provider a list of all the medicines, herbs, non-prescription drugs, or dietary supplements you use. Also tell them if you smoke, drink alcohol, or use illegal drugs. Some items may interact with your medicine. What should I watch for while using this medication? Visit your care team for regular checks on your progress. It may be some time before you see the benefit from this medication. This medication may affect your coordination, reaction time, or judgment. It may also hide signs that you are tired. This medication will not eliminate your abnormal tendency to fall asleep and is not a replacement for sleep. Do not drive or operate machinery until you know how this medication affects you. Sit up or stand slowly to reduce the risk of dizzy or fainting spells. Drinking alcohol with this medication can increase the risk of these side effects. This medication may cause serious skin reactions. They can happen weeks to months after starting the medication. Contact your care team right away if you notice fevers or flu-like symptoms with a rash. The rash may be red or purple and then turn into blisters or peeling of the skin. You may also notice a red rash with swelling of  the face, lips, or lymph nodes in your neck or under your arms. Estrogen or progestin hormones may not work as well while you are taking this medication and for 1 month after stopping  treatment. If you are using these hormones for contraception, talk to your care team about using a second type of contraception. A barrier contraceptive, such as a condom or diaphragm, is recommended. It is unknown if the effects of this medication will be increased by the use of caffeine. Caffeine is found in many foods, beverages, and medications. Ask your care team if you should limit or change your intake of caffeine-containing products while on this medication. Do not stop previously prescribed treatments for your condition, such as a CPAP machine, except on the advice of your care team. What side effects may I notice from receiving this medication? Side effects that you should report to your care team as soon as possible: Allergic reactions or angioedema--skin rash, itching or hives, swelling of the face, eyes, lips, tongue, arms, or legs, trouble swallowing or breathing Increase in blood pressure Mood and behavior changes--anxiety, nervousness, confusion, hallucinations, irritability, hostility, thoughts of suicide or self-harm, worsening mood, feelings of depression Rash, fever, and swollen lymph nodes Redness, blistering, peeling, or loosening of the skin, including inside the mouth Side effects that usually do not require medical attention (report to your care team if they continue or are bothersome): Dizziness Headache Nausea Trouble sleeping This list may not describe all possible side effects. Call your doctor for medical advice about side effects. You may report side effects to FDA at 1-800-FDA-1088. Where should I keep my medication? Keep out of the reach of children and pets. This medication can be abused. Keep it in a safe place to protect it from theft. Do not share it with anyone. It is only for you. Selling or giving away this medication is dangerous and against the law. Store at room temperature between 20 and 25 degrees C (68 and 77 degrees F). Get rid of any unused medication  after the expiration date. This medication may cause harm and death if it is taken by other adults, children, or pets. It is important to get rid of the medication as soon as you no longer need it or it is expired. You can do this in two ways: Take the medication to a medication take-back program. Check with your pharmacy or law enforcement to find a location. If you cannot return the medication, check the label or package insert to see if the medication should be thrown out in the garbage or flushed down the toilet. If you are not sure, ask your care team. If it is safe to put it in the trash, take the medication out of the container. Mix the medication with cat litter, dirt, coffee grounds, or other unwanted substance. Seal the mixture in a bag or container. Put it in the trash. NOTE: This sheet is a summary. It may not cover all possible information. If you have questions about this medicine, talk to your doctor, pharmacist, or health care provider.  2024 [employer]vier/Gold Standard (2021-05-28 00:00:00)Hypersomnia Hypersomnia is a condition in which a person feels very tired during the day even though the person gets plenty of sleep at night. A person with this condition may take naps during the day and may find it very difficult to wake up from sleep. Hypersomnia may affect a person's ability to think, concentrate, drive, or remember things. What are the causes? The cause of this  condition may not be known. Possible causes include: Taking certain medicines. Using drugs or alcohol. Sleep disorders, such as narcolepsy and sleep apnea. Injury to the head, brain, or spinal cord. Tumors. Certain medical conditions. These include: Depression. Diabetes. Gastroesophageal reflux disease (GERD). An underactive thyroid  gland (hypothyroidism). What are the signs or symptoms? The main symptoms of hypersomnia include: Feeling very tired throughout the day, regardless of how much sleep you got the night  before. Having trouble waking up. Others may find it difficult to wake you up when you are sleeping. Sleeping for longer and longer periods at a time. Taking naps throughout the day. Other symptoms may include: Feeling restless, anxious, or annoyed. Lacking energy. Having trouble with: Remembering. Speaking. Thinking. Loss of appetite. Seeing, hearing, tasting, smelling, or feeling things that are not real (hallucinations). How is this diagnosed? This condition may be diagnosed based on: Your symptoms and medical history. Your sleeping habits. Your health care provider may ask you to write down your sleeping habits in a daily sleep log, along with any symptoms you have. A series of tests that are done while you sleep (sleep study or polysomnogram). A test that measures how quickly you can fall asleep during the day (daytime nap study or multiple sleep latency test). How is this treated? This condition may be treated by: Following a regular sleep routine. Making lifestyle changes, such as changing your eating habits, getting regular exercise, and avoiding alcohol or caffeinated beverages. Taking medicines to make you more alert (stimulants) during the day. Treating any underlying medical causes of hypersomnia. Follow these instructions at home: Sleep habits Stick to a routine that includes going to bed and waking up at the same times every day and night. Practice a relaxing bedtime routine. This may include reading, meditation, deep breathing, or taking a warm bath before going to sleep. Exercise regularly as told by your health care provider. However, avoid exercising in the hours right before bedtime. Keep your sleep environment at a cooler temperature, darkened, and quiet. Sleep with pillows and a mattress that are comfortable and supportive. Schedule short 20-minute naps for when you feel sleepiest during the day. Talk with your employer or teachers about your hypersomnia. If  possible, adjust your schedule so that: You have a regular daytime work schedule. You can take a scheduled nap during the day. You do not have to work or be active at night. Do not eat a heavy meal for a few hours before bedtime. Eat your meals at about the same times every day. Safety  Do not drive or use machinery if you are sleepy. Ask your health care provider if it is safe for you to drive. Wear a life jacket when swimming or spending time near water. General instructions  Take over-the-counter and prescription medicines only as told by your health care provider. This includes supplements. Avoid drinking alcohol or caffeinated beverages. Keep a sleep log that will help your health care provider manage your condition. This may include information about: What time you go to bed each night. How often you wake up at night. How many hours you sleep at night. How often and for how long you nap during the day. Any observations from others, such as leg movements during sleep, sleep walking, or snoring. Keep all follow-up visits. This is important. Contact a health care provider if: You have new symptoms. Your symptoms get worse. Get help right away if: You have thoughts about hurting yourself or someone else. Get help right  away if you feel like you may hurt yourself or others, or have thoughts about taking your own life. Go to your nearest emergency room or: Call 911. Call the National Suicide Prevention Lifeline at 217-766-9529 or 988. This is open 24 hours a day. Text the Crisis Text Line at 5121632697. Summary Hypersomnia refers to a condition in which you feel very tired during the day even though you get plenty of sleep at night. A person with this condition may take naps during the day and may find it very difficult to wake up from sleep. Hypersomnia may affect a person's ability to think, concentrate, drive, or remember things. Treatment may include a regular sleep routine and  making some lifestyle changes. This information is not intended to replace advice given to you by your health care provider. Make sure you discuss any questions you have with your health care provider. Document Revised: 03/30/2021 Document Reviewed: 03/30/2021 Elsevier Patient Education  2024 [employer].

## 2023-12-27 NOTE — Progress Notes (Signed)
 @[facility]   Provider:  Dedra Gores, MD  Primary Care Physician:  Brooke [name] Brooke [name] [name] Brooke JONELLE, DO [address] [place] [address]  Referring Provider: Antonio [name], Brooke JONELLE, Do [address] [place],  [place] [address]        Chief Concern for this Consultation:   Patient presents with          Dear Dr Brooke [name],    I have the pleasure of meeting with Brooke [name], Brooke [name] [name],  on [phone], who is a 53 y.o.  female patient, and night shift woker, 3 weeks out of the onths she is on call-  seen here for a Sleep Medicine Consultation.   the patient reports chronic insomnia and insufficient sleep , her mind is racing and she is treated for Anxiety and depression by Dr .[name]. Her psychiatric medications let to weight gain. She had problems with weight in the past , lost on Zepbound  to a 135 pounds and now finds her medication no longer covered.  Her anxiety increased on phentermine .  She feels depressed , has been weight at 177 pounds.    Nurse Brooke [name] Brooke [name] [name] presented with a medical history of  Past Medical History:  Diagnosis Date   Anxiety    Arthritis    right shoulder   Back pain 12/13/2011   Bursitis    Constipation    Depression    Fatty liver    seen on US  04-2018   HLD (hyperlipidemia)    Hyperglycemia 03/05/2013   Hypotension    Incontinence    Lower extremity edema    Lumbar stress fracture    Obesity    Osteoporosis    SOB (shortness of breath)      has a past medical history of Anxiety, Arthritis, Back pain (12/13/2011), Bursitis, Constipation, Depression, Fatty liver, HLD (hyperlipidemia), Hyperglycemia (03/05/2013), Hypotension, stress Incontinence, Lower extremity edema, Lumbar stress fracture, Obesity, Osteoporosis, and SOB (shortness of breath)..  Sleep relevant medical/ surgical and symptom history:   ENT surgery or problems: (Sinusitis, Tonsillectomy), Bursitis right hip, Sleep walking , PTSD  Trauma such as TBI/ whiplash,  Autoimmune  disorders,  Anemia, RLS,  TMJ, GERD,  Thyroid  disease or other endocrinological disorders, Substance Abuse, Mood disorders, Depression.  = This patient had a previous sleep study 01-27-2012 at [facility] with a resulting diagnosis of  no apnea '.   This patient has used the following therapies:  weight loss improved sleep, improved self esteem, sleep aids :  xanax , Ambien  , trazodone  ( still has these prescribed by MD) .    Family medical history:  Her late sister was bipolar , died in a psychotic episode. There are  biological family members affected by Sleep apnea ( Brother ), or Insomnia (mother/ late sister ) , by excessive daytime sleepiness (brother ).     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 She  lives in a private home, in a household with spouse, an adult  daughter , son-in-law and 41 months old grandson /  there are 3 dogs and one cat in the home.  4 other grandchildren live with their parents outside the household.   The patient currently works/ used to work in shifts (night/ rotating)  3 weeks on , one week off.. The workplace involves night shift, on call, weekends. physical activity.  Nicotine use: none .  ETOH use: none ,  Caffeine intake in form of: Coffee (/), Soft drinks (diet  Mt Dew), Tea ( /) no Energy drinks ( including those containing  taurine ).  Caffeine is consumed at any time .  Exercises not regularly. Too fatigued.  Hobbies ; reading and movies put me to sleep- yard sale / thrifting.     Sleep habits and routines are as follows: The patient's dinner time is around 4-5  PM.  The patient goes to bed at 8 before night shift- .may sleep maximal 5 hours in daytime, fragmented sleep . The bedroom is shared with spouse  and is described as cool, quiet, and dark. The patient reports that it takes 30-60 minutes to fall asleep, then continues to sleep for intervals of 2  hours.    The preferred sleep position is supine , with support of 2 pillows, (non-  adjustable bed/ recliner ).  One neck pillow  The total estimated sleep time is circa 5 hours.   Dreams are reportedly frequent/ and can be vivid. Dream enactment has not been reported.   6-7  AM is the usual week- day rise time. The patient wakes up with an alarm set at 6.  The Patient  reports not/ mostly/ feeling refreshed and restored - waking with symptoms such as dry mouth, morning headaches, stiffness or pain, and fatigue.  No sleep paralysis has been experienced.  Naps in daytime are taken frequently (there is a desire to nap and opportunity), These do not interfere with nocturnal sleep.    Review of Systems: Out of a complete 14 system review, the patient complains of only the following symptoms, and all other reviewed systems are negative.:  Hypersomnia   Insomnia  Depression/ anxiety:  Pain:  Headaches     Snoring, Sleep fragmentation, Nocturia   How likely are you to doze in the following situations: 0 = not likely, 1 = slight chance, 2 = moderate chance, 3 = high chance Sitting and Reading? Watching Television? Sitting inactive in a public place (theater or meeting)? As a passenger in a car for an hour without a break? Lying down in the afternoon when circumstances permit? Sitting and talking to someone? Sitting quietly after lunch without alcohol? In a car, while stopped for a few minutes in traffic?   Total ESS =19 / 24 points.    FSS endorsed at 46/ 63 points.    Social History   Socioeconomic History   Marital status: Married    Spouse name: John   Number of children: Not on file   Years of education: 16   Highest education level: Associate degree: occupational, Scientist, product/process development, or vocational program  Occupational History   Occupation: RN/short stay    Employer: [employer]  Tobacco Use   Smoking status: Never   Smokeless tobacco: Never  Vaping Use   Vaping status: Never Used  Substance and Sexual Activity   Alcohol use: No    Alcohol/week: 0.0 standard  drinks of alcohol   Drug use: No   Sexual activity: Yes    Partners: Male    Birth control/protection: Surgical    Comment: Husband's vasectomy  Other Topics Concern   Not on file  Social History Narrative   Not on file   Social Drivers of Health   Financial Resource Strain: Low Risk  (11/07/2023)   Overall Financial Resource Strain (CARDIA)    Difficulty of Paying Living Expenses: Not hard at all  Food Insecurity: No Food Insecurity (11/07/2023)   Hunger Vital Sign    Worried About Programme researcher, broadcasting/film/video in  the Last Year: Never true    Ran Out of Food in the Last Year: Never true  Transportation Needs: No Transportation Needs (11/07/2023)   PRAPARE - Administrator, Civil Service (Medical): No    Lack of Transportation (Non-Medical): No  Physical Activity: Inactive (11/07/2023)   Exercise Vital Sign    Days of Exercise per Week: 0 days    Minutes of Exercise per Session: Not on file  Stress: No Stress Concern Present (11/07/2023)   [employer] of Occupational Health - Occupational Stress Questionnaire    Feeling of Stress: Not at all  Social Connections: Socially Integrated (11/07/2023)   Social Connection and Isolation Panel    Frequency of Communication with Friends and Family: More than three times a week    Frequency of Social Gatherings with Friends and Family: Three times a week    Attends Religious Services: 1 to 4 times per year    Active Member of Clubs or Organizations: Yes    Attends Banker Meetings: 1 to 4 times per year    Marital Status: Married    Family History  Problem Relation Age of Onset   Colon polyps Mother    Thyroid  disease Mother    Hyperlipidemia Mother    Diabetes Mother    Hypertension Mother    Diabetes Father    Hypertension Father    Hyperlipidemia Father    Mental illness Sister    Diabetes Paternal Aunt    Hypertension Paternal Uncle    Diabetes Paternal Grandmother    Hypertension Paternal Grandmother     Hyperlipidemia Paternal Grandmother    Colon cancer Neg Hx    Esophageal cancer Neg Hx    Rectal cancer Neg Hx    Stomach cancer Neg Hx     Past Medical History:  Diagnosis Date   Anxiety    Arthritis    right shoulder   Back pain 12/13/2011   Bursitis    Constipation    Depression    Fatty liver    seen on US  04-2018   HLD (hyperlipidemia)    Hyperglycemia 03/05/2013   Hypotension    Incontinence    Lower extremity edema    Lumbar stress fracture    Obesity    Osteoporosis    SOB (shortness of breath)     Past Surgical History:  Procedure Laterality Date   BREAST BIOPSY Right 12/27/2018   NEG   COLONOSCOPY     EXCISION/RELEASE BURSA HIP Right 04/11/2019   Procedure: Right hip bursectomy;  Surgeon: Heide Ingle, MD;  Location: [facility];  Service: Orthopedics;  Laterality: Right;    RHINOPLASTY     SINUS ENDO W/FUSION     TONSILLECTOMY       Current Outpatient Medications on File Prior to Visit  Medication Sig Dispense Refill   acetaminophen  (TYLENOL ) 500 MG tablet Take 1,000 mg by mouth every 6 (six) hours as needed for moderate pain.     alendronate  (FOSAMAX ) 70 MG tablet Take 1 tablet (70 mg total) by mouth every 7 (seven) days. Take with a full glass of water on an empty stomach. 12 tablet 3   ALPRAZolam  (ALPRAZOLAM  XR) 0.5 MG 24 hr tablet Take 1 tablet (0.5 mg total) by mouth in the morning and 2 tablets (1 mg total) by mouth at night. 270 tablet 1   AMBULATORY NON FORMULARY MEDICATION Medication Name: Diltiazem 2% compound with Lidocaine  5% 30 g 0   amoxicillin  (AMOXIL )  500 MG capsule Take 1 capsule (500 mg total) by mouth 3 (three) times daily until gone. 30 capsule 1   ARIPiprazole  (ABILIFY ) 10 MG tablet Take 1 tablet (10 mg total) by mouth daily. 60 tablet 0   azelastine  (ASTELIN ) 0.1 % nasal spray Place 1 spray into both nostrils 2 (two) times daily. Use in each nostril as directed 30 mL 12   Calcium Carb-Cholecalciferol  (CALCIUM 600+D) 600-800  MG-UNIT TABS Take 2 tablets by mouth daily.     celecoxib  (CELEBREX ) 100 MG capsule Take 1 capsule (100 mg total) by mouth 2 (two) times daily. 60 capsule 5   fluticasone  (FLONASE ) 50 MCG/ACT nasal spray Place 2 sprays into both nostrils daily. (Patient taking differently: Place 2 sprays into both nostrils daily as needed for allergies or rhinitis.) 16 g 6   ibuprofen  (ADVIL ) 200 MG tablet Take 800 mg by mouth every 6 (six) hours as needed for moderate pain or headache.     PARoxetine  (PAXIL ) 40 MG tablet Take 2 tablets (80 mg total) by mouth at bedtime. 180 tablet 0   polyethylene glycol (MIRALAX  / GLYCOLAX ) 17 g packet Take 17 g by mouth daily. (Patient taking differently: Take 17 g by mouth daily as needed for mild constipation.)     propranolol  (INDERAL ) 20 MG tablet Take 1-2 tablets (20-40 mg total) by mouth daily as needed (anxiety). 180 tablet 0   topiramate  (TOPAMAX ) 50 MG tablet Take 2 tablets (100 mg total) by mouth 2 (two) times daily. 360 tablet 1   traZODone  (DESYREL ) 50 MG tablet Take 2 tablets (100 mg total) by mouth at bedtime. 180 tablet 0   Vitamin D , Ergocalciferol , (DRISDOL ) 1.25 MG (50000 UNIT) CAPS capsule Take 1 capsule (50,000 Units total) by mouth every 7 (seven) days. 12 capsule 1   zolpidem  (AMBIEN ) 10 MG tablet Take 1 tablet (10 mg total) by mouth at bedtime as needed for sleep. 90 tablet 1   No current facility-administered medications on file prior to visit.    No Known Allergies  Vitals:   12/27/23 1253  BP: 128/85  Pulse: 97     Physical exam:   General: The patient was alert and appears not in acute distress.  Mood and affect are appropriate .  The patient's interactions are: Cooperative, makes eye contact, follows the instructions and answers questions coherently.  The patient is groomed and appropriately groomed and dressed. Head: Normocephalic, atraumatic.  Neck is supple. Mallampati: 1-2 The neck circumference measured 15 inches. Nasal airflow  was patent ,   Overbite / Retrognathia was noted.  Dental status: overbite , crowded jaw.  Cardiovascular:  Regular rate and cardiac rhythm by palpable pulse. Respiratory: no audible wheezing, no tachypnoea.   Skin:  Without evidence of ankle edema. No discoloration.  Trunk:  BMI is now 31.4 The patient's posture was erect.   Neurologic exam : The patient was awake and alert, oriented to place and time.   Attention span & concentration ability appeared normal.  Speech was fluent, without dysarthria, but some dysphonia  ( raspy) or aphasia, and of normal volume.     Cranial nerves:  There was no loss of smell or taste reported  Pupils are round, large - but equal in size and briskly reactive to light.  Funduscopic exam was deferred.  Extraocular movements in vertical and horizontal planes were intact and without nystagmus. (No Diplopia reported). Visual fields by finger perimetry are intact. Hearing was intact to soft voice.   Facial sensation  intact to fine touch.  Facial motor strength: Symmetric movement and tongue and uvula move midline.  Neck ROM: rotation, tilt and flexion extension were intact for age and shoulder shrug was symmetrical.    Motor exam:  Symmetric bulk, strength and ROM.   Normal tone without cog- wheeling, and symmetric grip strength.   Sensory:  Fine touch and vibration intact.  Proprioception tested in the upper extremities was normal.   Coordination: The patient reported no problems with button closure and no changes to penmanship.   The Finger-to-nose maneuver was intact without evidence of ataxia, dysmetria or tremor.   Gait and station: Patient could rise unassisted from a seated position, without bracing, and walked without assistive device.  Stance was of normal/ wide width.  The patient turned with 3 steps. No limp was noted.  Arm swing was preserved. The patient's gait posture was stooped.   Deep tendon reflexes: Upper extremities did show  symmetric DTRs. Lower extremity DTRs were symmetric and brisk(!)   Babinski response was deferred .   I would like to thank Brooke [name] Meth, Brooke SAUNDERS, DO and [address] [place],  [place] [address] for allowing me to meet with Brooke [name] Brooke [name] [name];  Risk factors for OSA were present,  including : Body mass index is 31.35 kg/m., Overbite  and upper airway anatomy.  1)  The patient has had insomnia problems for a decade or longer, attributed some to depression and anxiety but also to being on call, night shift work, 2)  Her excessive daytime sleepiness is closely related to the limited sleep time and the stress at work, but also the limited predictability of sleep times.  3)   weight gain has increased the snoring volume.    My Plan is to proceed with:   1) PSG repeat , rather HST due to insomnia.   2) Modafinil fr shift work sleep disorder.  3) Weight treatment referral wanted.  We look to see if OSA is present.   4)  day time work should be initiated, night shift avoided.  Regular sleep and wake times. Needs to be in control of her sleep time.   5) cognitive behavior therapy , will ask for Dr Cottle's advise 9 may be in his provider group?0 .     I plan to follow up personally within the next .4.months /through our NP within 4 months.   A total time of  45  minutes consistent of a part of face to face encounter , exam and interview,  and additional preparation time for chart review was spent .  At today's visit, we discussed treatment options, associated risk and benefits, and engage in counseling as needed including, but not limited to:   Sleep hygiene, Quality Sleep Habits, and Safety concerns for patients with daytime sleepiness who are warned to not operate machinery/ motor vehicles when drowsy.  Additionally, the following were reviewed: Past medical records, past medical and surgical history, family and social background, as well as relevant  laboratory results, imaging findings, and medical notes, where applicable.  This note was generated by myself in part by using dictation software, and as a result, it may contain unintentional typos and errors.  Nevertheless, effort was made to accurately convey the pertinent aspects of the patient's visit.   Brooke Gores, MD ,     [facility] and [facility] Sleep Board certified in Sleep Medicine by The [employer] of Sleep Medicine and  Diplomate of the [employer] (AASM) . Board certified In Neurology, Diplomat of the ABPN,  Fellow of the [employer] of Neu[employer].

## 2024-01-01 ENCOUNTER — Other Ambulatory Visit (HOSPITAL_BASED_OUTPATIENT_CLINIC_OR_DEPARTMENT_OTHER): Payer: Self-pay

## 2024-01-02 ENCOUNTER — Other Ambulatory Visit: Payer: Self-pay

## 2024-01-17 ENCOUNTER — Other Ambulatory Visit (HOSPITAL_BASED_OUTPATIENT_CLINIC_OR_DEPARTMENT_OTHER): Payer: Self-pay

## 2024-01-17 MED ORDER — FLUZONE 0.5 ML IM SUSY
0.5000 mL | PREFILLED_SYRINGE | Freq: Once | INTRAMUSCULAR | 0 refills | Status: AC
  Filled 2024-01-17: qty 0.5, 1d supply, fill #0

## 2024-01-26 ENCOUNTER — Encounter: Payer: Self-pay | Admitting: Psychiatry

## 2024-01-26 ENCOUNTER — Ambulatory Visit (INDEPENDENT_AMBULATORY_CARE_PROVIDER_SITE_OTHER): Admitting: Psychiatry

## 2024-01-26 ENCOUNTER — Other Ambulatory Visit (HOSPITAL_BASED_OUTPATIENT_CLINIC_OR_DEPARTMENT_OTHER): Payer: Self-pay

## 2024-01-26 DIAGNOSIS — F411 Generalized anxiety disorder: Secondary | ICD-10-CM

## 2024-01-26 DIAGNOSIS — F4001 Agoraphobia with panic disorder: Secondary | ICD-10-CM

## 2024-01-26 DIAGNOSIS — F5105 Insomnia due to other mental disorder: Secondary | ICD-10-CM | POA: Diagnosis not present

## 2024-01-26 DIAGNOSIS — F3342 Major depressive disorder, recurrent, in full remission: Secondary | ICD-10-CM | POA: Diagnosis not present

## 2024-01-26 MED ORDER — ARIPIPRAZOLE 15 MG PO TABS
15.0000 mg | ORAL_TABLET | Freq: Every day | ORAL | 0 refills | Status: DC
  Filled 2024-01-26: qty 90, 90d supply, fill #0

## 2024-01-26 NOTE — Progress Notes (Signed)
 Brooke [name] [id number] Sep 09, 1970 53 y.o.  Subjective:   Patient ID:  Brooke [name] is a 53 y.o. (DOB 1970/09/20) female.  Chief Complaint:  Chief Complaint  Patient presents with   Follow-up   Anxiety   Depression   Sleeping Problem    HPI Brooke [name] presents to the office today for follow-up of anxiety and insomnia  seen December 2019.  For anxiety and weight gain topiramate  was increased off label to 200 mg daily.  For anxiety propranolol  20 to 40 mg twice daily was added as needed.    seen November 2020: No meds were changed.  The following was noted: OK except for life events.  Still a lot of worry. Managed normally.  H agrees also.  Son's wife pregnant with another man's baby.  Real busy with 60 hours weekly.  Can't take Ambien  at times bc of that.  Realizes she needs to take better self care.   Brooke [name] August 23, 2019 appointment, the following is noted: Still doing well with meds.  Cont intermittent problems with work affecting sleep patterns and which sleep meds she can take.  Satisfied with meds.  No panic.  depression and anxiety manageable not gone.  Panic worse if overworked DT short staffed.  Patient reports stable mood and denies depressed or irritable moods.  Patient reports occasional difficulty with anxiety.  Patient reports occasionally difficulty with sleep initiation or maintenance. Denies appetite disturbance.  Patient reports that energy and motivation have been good.  Patient denies any difficulty with concentration.  Patient denies any suicidal ideation.  04/10/20 appt with following noted: Lost 33# at [facility] on 1000 calorie diet and Wegovy . A little extra anxiety lately without any specific reason for 2-3 mos.  No panic.  Mostly worry  Sx without avoidance.  Able to enjoy things normally.  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   Patient reports stable  mood and denies depressed or irritable moods.  Patient denies difficulty with sleep initiation or maintenance. Denies appetite disturbance.  Patient reports that energy and motivation have been good.  Patient denies any difficulty with concentration.  Patient denies any suicidal ideation. Plan: Continue alprazolam  XR 0.5 mg every morning and 1 mg nightly increase aripiprazole  10 mg daily to see anxiety is better Continue paroxetine  80 mg daily Continue propranolol  20 mg tablets 1-2 twice daily as needed anxiety Continue topiramate  50 mg tablets 2 twice daily Continue trazodone  100 mg nightly Continue zolpidem  5 to 10 mg nightly as needed insomnia She typically will alternate trazodone  or Ambien  depending on her work schedule.  10/09/20 appt noted: Good.  Lost 50# pounds and feels a whole lot better. Using Wegovy  Potential shoulder surgery. No panic.  Anxiety has been controlled.  Not depressed.   Increase in Abilify  helped that a lot. No SE. Still shift work.  Low vitamin D .   No concerns about meds and no reason for changes. Doing well. Plan: No med changes  04/09/2021 appointment with the following noted: Good normal stressors worse this time of year. Propranolol  used prn is helpful for anxiety physically and tolerated. No SE with meds. Still She typically will alternate trazodone  or Ambien  depending on her work schedule. Sleep ok with meds mostly. Working dayshift OR, Clinical biochemist.  Long hours. Lost down to 137 from 190#.  Makes her feel better about herself.  On Wegovy .  [name].  No desire for med changes Will get labs done. Plan: Continue alprazolam  XR 0.5 mg every morning and 1 mg nightly Continue aripiprazole  10 mg daily  the increase helped anxiety is better Continue paroxetine  80 mg daily Continue propranolol  20 mg tablets 1-2 twice daily as needed anxiety helpful prn Continue topiramate  50 mg tablets 2 twice daily Continue trazodone  100 mg nightly Continue zolpidem  5 to  10 mg nightly as needed insomnia  10/08/21 appt noted: Good overall .  [name] anxiety with stressors.  No panic. Patient reports stable mood and denies depressed or irritable moods.   Patient denies difficulty with sleep initiation or maintenance. Denies appetite disturbance.  Patient reports that energy and motivation have been good.  Patient denies any difficulty with concentration.  Patient denies any suicidal ideation. Dayshift generally. Propranolol  helps  09/28/22 appt noted: Pretty good with a little added family anxiety.   Manageable. Sleep is ok.   53 yo [redacted] week pregnant.   She will be there to help with baby. No sig panic.  No sig avoidance. No SE .   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  04/04/23 appt noted: Meds:  alprazolam  XR 0.5 mg AM and 1 mg HS, paroxetine  80, topiramate  100 mg BID, trazodone  100 mg HS, Ambien  10 HS.  Abilify  10 Wt gain but  No panic except for phentermine  and stopped it.   Does well with propranolol  prn anxiety.   Interested in trying Vraylar instead of Abilify  to see if can lose wt. Recognizes Paxil  can cause wt gain but tried the most reasonable alternatives.   Patient reports stable mood and denies depressed or irritable moods.  Patient denies any recent difficulty with anxiety.  Patient denies difficulty with sleep initiation or maintenance. Denies appetite disturbance.  Patient reports that energy and motivation have been good.  Patient denies any difficulty with concentration.  Patient denies any suicidal ideation. Plan: Stop Abilify  Trial Vraylar to see if wt loss can occur.  1.5 mg daily.  11/07/23 appt noted: Meds:  alprazolam  XR 0.5 mg HS and prn, paroxetine  80, topiramate  100 mg BID, trazodone  100 mg HS, Ambien  10 HS.  Abilify  10 Anxiety up for a month.  Was triggered.  F with health issues.  F can't accept hi slimitations and gets himself in trouble physically.  Spends a lot of  nights sitting up worrying.  Also stress D and her BF keeping house in a mess.   1 full panic about 3 weeks ago.  At home more and it's good.   Job of on call 3 weeks.  Doesn't take Ambien  then.   Anxiety interfering with sleep but per H does better sleeping during the day.  Brain won't turn off without something to help her sleep.   Stays tired a lot.   Wonders about FM.  Avg 4-5 hours sleep.  Plan: increase alprazolam  XR 0.5 mg every 12 hours  01/26/24 appt noted:  Meds:  alprazolam  XR 0.5 mg BID, paroxetine  80, topiramate  100 mg BID, trazodone  100 mg HS, Ambien  10 HS.  Abilify  10 Better but not controlled.  Still pretty anxious.  No SE. Wants to increase Abilify  for anxiety.    Still a lot of sleep disturbances but trying to get off night schedule to get better sleep quality.  This would get her off call 3 weeks/month.   Not sig dep.   Pending sleep study. Regained wt off GLP-1s.  Disc her concerns about D's compliance.    No SE.  No excessive sleepiness with Xanax   and sometimes only 2 daily.  Past Psychiatric Medication Trials: Citalopram, fluoxetine, fluvoxamine, and Anafranil, paroxetine  80, duloxetine Zoloft during pregnancy Abilify  10 Buspirone Topiramate  Ambien , Ambien  CR, trazodone  Xanax  XR Phentermine  anxiety  Review of Systems:  Review of Systems  Constitutional:  Positive for fatigue.  Musculoskeletal:  Negative for arthralgias.       Pending shoulder surgery  Neurological:  Negative for dizziness and tremors.  Psychiatric/Behavioral:  Positive for sleep disturbance. Negative for agitation, behavioral problems, confusion, decreased concentration, dysphoric mood, hallucinations, self-injury and suicidal ideas. The patient is nervous/anxious. The patient is not hyperactive.     Medications: I have reviewed the patient's current medications.  Current Outpatient Medications  Medication Sig Dispense Refill   acetaminophen  (TYLENOL ) 500 MG tablet Take 1,000 mg by  mouth every 6 (six) hours as needed for moderate pain.     alendronate  (FOSAMAX ) 70 MG tablet Take 1 tablet (70 mg total) by mouth every 7 (seven) days. Take with a full glass of water on an empty stomach. 12 tablet 3   ALPRAZolam  (ALPRAZOLAM  XR) 0.5 MG 24 hr tablet Take 1 tablet (0.5 mg total) by mouth in the morning and 2 tablets (1 mg total) by mouth at night. 270 tablet 1   AMBULATORY NON FORMULARY MEDICATION Medication Name: Diltiazem 2% compound with Lidocaine  5% 30 g 0   amoxicillin  (AMOXIL ) 500 MG capsule Take 1 capsule (500 mg total) by mouth 3 (three) times daily until gone. 30 capsule 1   Armodafinil  200 MG TABS Take 1 tablet (200 mg total) by mouth daily. 30 tablet 0   azelastine  (ASTELIN ) 0.1 % nasal spray Place 1 spray into both nostrils 2 (two) times daily. Use in each nostril as directed 30 mL 12   Calcium Carb-Cholecalciferol  (CALCIUM 600+D) 600-800 MG-UNIT TABS Take 2 tablets by mouth daily.     celecoxib  (CELEBREX ) 100 MG capsule Take 1 capsule (100 mg total) by mouth 2 (two) times daily. 60 capsule 5   fluticasone  (FLONASE ) 50 MCG/ACT nasal spray Place 2 sprays into both nostrils daily. (Patient taking differently: Place 2 sprays into both nostrils daily as needed for allergies or rhinitis.) 16 g 6   ibuprofen  (ADVIL ) 200 MG tablet Take 800 mg by mouth every 6 (six) hours as needed for moderate pain or headache.     PARoxetine  (PAXIL ) 40 MG tablet Take 2 tablets (80 mg total) by mouth at bedtime. 180 tablet 0   polyethylene glycol (MIRALAX  / GLYCOLAX ) 17 g packet Take 17 g by mouth daily. (Patient taking differently: Take 17 g by mouth daily as needed for mild constipation.)     propranolol  (INDERAL ) 20 MG tablet Take 1-2 tablets (20-40 mg total) by mouth daily as needed (anxiety). 180 tablet 0   topiramate  (TOPAMAX ) 50 MG tablet Take 2 tablets (100 mg total) by mouth 2 (two) times daily. 360 tablet 1   traZODone  (DESYREL ) 50 MG tablet Take 2 tablets (100 mg total) by mouth at  bedtime. 180 tablet 0   Vitamin D , Ergocalciferol , (DRISDOL ) 1.25 MG (50000 UNIT) CAPS capsule Take 1 capsule (50,000 Units total) by mouth every 7 (seven) days. 12 capsule 1   zolpidem  (AMBIEN ) 10 MG tablet Take 1 tablet (10 mg total) by mouth at bedtime as needed for sleep. 90 tablet 1   ARIPiprazole  (ABILIFY ) 15 MG tablet Take 1 tablet (15 mg total) by mouth daily. 90  tablet 0   No current facility-administered medications for this visit.    Medication Side Effects: None  Allergies: No Known Allergies  Past Medical History:  Diagnosis Date   Anxiety    Arthritis    right shoulder   Back pain 12/13/2011   Bursitis    Constipation    Depression    Fatty liver    seen on US  04-2018   HLD (hyperlipidemia)    Hyperglycemia 03/05/2013   Hypotension    Incontinence    Lower extremity edema    Lumbar stress fracture    Obesity    Osteoporosis    SOB (shortness of breath)     Family History  Problem Relation Age of Onset   Colon polyps Mother    Thyroid  disease Mother    Hyperlipidemia Mother    Diabetes Mother    Hypertension Mother    Diabetes Father    Hypertension Father    Hyperlipidemia Father    Mental illness Sister    Diabetes Paternal Aunt    Hypertension Paternal Uncle    Diabetes Paternal Grandmother    Hypertension Paternal Grandmother    Hyperlipidemia Paternal Grandmother    Colon cancer Neg Hx    Esophageal cancer Neg Hx    Rectal cancer Neg Hx    Stomach cancer Neg Hx     Social History   Socioeconomic History   Marital status: Married    Spouse name: John   Number of children: Not on file   Years of education: 16   Highest education level: Associate degree: occupational, Scientist, product/process development, or vocational program  Occupational History   Occupation: RN/short stay    Employer: [employer]  Tobacco Use   Smoking status: Never   Smokeless tobacco: Never  Vaping Use   Vaping status: Never Used  Substance and Sexual Activity   Alcohol use: No     Alcohol/week: 0.0 standard drinks of alcohol   Drug use: No   Sexual activity: Yes    Partners: Male    Birth control/protection: Surgical    Comment: Husband's vasectomy  Other Topics Concern   Not on file  Social History Narrative   Not on file   Social Drivers of Health   Financial Resource Strain: Low Risk  (11/07/2023)   Overall Financial Resource Strain (CARDIA)    Difficulty of Paying Living Expenses: Not hard at all  Food Insecurity: No Food Insecurity (11/07/2023)   Hunger Vital Sign    Worried About Running Out of Food in the Last Year: Never true    Ran Out of Food in the Last Year: Never true  Transportation Needs: No Transportation Needs (11/07/2023)   PRAPARE - Administrator, Civil Service (Medical): No    Lack of Transportation (Non-Medical): No  Physical Activity: Inactive (11/07/2023)   Exercise Vital Sign    Days of Exercise per Week: 0 days    Minutes of Exercise per Session: Not on file  Stress: No Stress Concern Present (11/07/2023)   [employer] of Occupational Health - Occupational Stress Questionnaire    Feeling of Stress: Not at all  Social Connections: Socially Integrated (11/07/2023)   Social Connection and Isolation Panel    Frequency of Communication with Friends and Family: More than three times a week    Frequency of Social Gatherings with Friends and Family: Three times a week    Attends Religious Services: 1 to 4 times per year    Active Member of  Clubs or Organizations: Yes    Attends Banker Meetings: 1 to 4 times per year    Marital Status: Married  Catering manager Violence: Not on file    Past Medical History, Surgical history, Social history, and Family history were reviewed and updated as appropriate.   Please see review of systems for further details on the patient's review from today.   Objective:   Physical Exam:  LMP 04/03/2018   Physical Exam Constitutional:      General: She is not in acute  distress.    Appearance: Normal appearance.  Musculoskeletal:        General: No deformity.  Neurological:     Mental Status: She is alert and oriented to person, place, and time.     Coordination: Coordination normal.     Gait: Gait normal.  Psychiatric:        Attention and Perception: She is attentive.        Mood and Affect: Mood is anxious. Mood is not depressed. Affect is not labile, blunt, angry or inappropriate.        Speech: Speech normal.        Behavior: Behavior normal.        Thought Content: Thought content normal. Thought content is not delusional. Thought content does not include homicidal or suicidal ideation. Thought content does not include suicidal plan.        Cognition and Memory: Cognition normal.        Judgment: Judgment normal.     Comments: Insight intact. No auditory or visual hallucinations. No delusions.  Anxiety worse with stress     Lab Review:     Component Value Date/Time   NA 139 11/08/2023 1801   NA 144 08/06/2021 1022   K 3.6 11/08/2023 1801   CL 106 11/08/2023 1801   CO2 28 11/08/2023 1801   GLUCOSE 78 11/08/2023 1801   BUN 17 11/08/2023 1801   BUN 14 08/06/2021 1022   CREATININE 0.94 11/08/2023 1801   CREATININE 1.10 (H) 12/18/2021 1601   CALCIUM 8.8 11/08/2023 1801   PROT 6.5 11/08/2023 1801   PROT 6.1 08/06/2021 1022   ALBUMIN 4.3 11/08/2023 1801   ALBUMIN 4.3 08/06/2021 1022   AST 24 11/08/2023 1801   ALT 24 11/08/2023 1801   ALKPHOS 95 11/08/2023 1801   BILITOT 0.3 11/08/2023 1801   BILITOT 0.2 08/06/2021 1022   GFRNONAA >60 11/19/2020 0907   GFRAA 89 04/08/2020 0851       Component Value Date/Time   WBC 4.8 11/08/2023 1801   RBC 4.76 11/08/2023 1801   HGB 14.1 11/08/2023 1801   HGB 14.7 08/06/2021 1022   HCT 42.9 11/08/2023 1801   HCT 44.5 08/06/2021 1022   PLT 226.0 11/08/2023 1801   PLT 210 08/06/2021 1022   MCV 90.0 11/08/2023 1801   MCV 93 08/06/2021 1022   MCH 31.9 12/18/2021 1601   MCHC 33.0 11/08/2023  1801   RDW 13.4 11/08/2023 1801   RDW 12.7 08/06/2021 1022   LYMPHSABS 1.9 11/08/2023 1801   LYMPHSABS 1.2 08/06/2021 1022   MONOABS 0.3 11/08/2023 1801   EOSABS 0.2 11/08/2023 1801   EOSABS 0.0 08/06/2021 1022   BASOSABS 0.0 11/08/2023 1801   BASOSABS 0.0 08/06/2021 1022    No results found for: POCLITH, LITHIUM   No results found for: PHENYTOIN, PHENOBARB, VALPROATE, CBMZ   .res Assessment: Plan:    Major depression, recurrent, full remission - Plan: ARIPiprazole  (ABILIFY ) 15 MG tablet  Generalized anxiety disorder  Panic disorder with agoraphobia  Insomnia due to mental condition   Overall med combo has worked well for depression and anxiety but is better than last time...  Disc options for benefit more: increase Abilify , topiramate  off label, prn propranolol . Disc SE in detail of each option.   Doing ok without panic with less Xanax  XR for now.  Discussed potential metabolic side effects associated with atypical antipsychotics, as well as potential risk for movement side effects. Advised pt to contact office if movement side effects occur.  No AIM  We discussed the short-term risks associated with benzodiazepines including sedation and increased fall risk among others.  Discussed long-term side effect risk including dependence, potential withdrawal symptoms, and the potential eventual dose-related risk of dementia.  Newer studies published 2020 dispute risk of dementia with benzodiazepines.  And disc risk of Ambien  amnesia.  Consider switch to Ativan from Xanax  to reduce the sedation from Bz.    Better self care and more sleep would help. Protect SLEEP.  Disc difficulty with night shift.  She is working on getting off night shift.  Disc borderline low vitamin D  levels and psych literature and immune studies suggesting goal of mid range vit d levels.  continue topiramate  bc help weight gain risk to 200mg  daily.  Disc SE.  Rx prn propranolol  20-40 mg BID for  anxiety.   Disc polypharmacy is medcially necessary and has been helpful.  It has taken a lot of meds and combination of mechanisms in order to manage her depression and anxiety symptoms.  She tolerates the higher dose of paroxetine  without side effects.  Panic was not managed with less paroxetine . Disc purpose of each med.  Disc SE in detail and SSRI withdrawal sx. Discussed serotonin syndrome.  Avoid dextromethorphan, tramadol  which can increase risk. Continue alprazolam  XR 0.5 mg every 12 hours Continue paroxetine  80 mg daily Increase Abilify  15.  Continue propranolol  20 mg tablets 1-2 twice daily as needed anxiety Continue topiramate  50 mg tablets 2 twice daily Continue trazodone  100 mg nightly Continue zolpidem  5 to 10 mg nightly as needed insomnia.  Don't take when on call. She typically will alternate trazodone  or Ambien  depending on her work schedule.  This appt was 30 mins.  FU 2  mos  Lorene Macintosh, MD, DFAPA     Please see After Visit Summary for patient specific instructions.  Future Appointments  Date Time Provider Department Center  01/31/2024  9:00 AM [facility] None     No orders of the defined types were placed in this encounter.     -------------------------------

## 2024-01-31 ENCOUNTER — Encounter

## 2024-01-31 ENCOUNTER — Telehealth: Payer: Self-pay | Admitting: Neurology

## 2024-01-31 NOTE — Telephone Encounter (Signed)
 Patient canceled her 01/31/24 HST appt. Sent mychart

## 2024-02-06 ENCOUNTER — Other Ambulatory Visit (HOSPITAL_BASED_OUTPATIENT_CLINIC_OR_DEPARTMENT_OTHER): Payer: Self-pay

## 2024-02-06 ENCOUNTER — Other Ambulatory Visit: Payer: Self-pay

## 2024-02-06 ENCOUNTER — Other Ambulatory Visit: Payer: Self-pay | Admitting: Psychiatry

## 2024-02-06 DIAGNOSIS — F3342 Major depressive disorder, recurrent, in full remission: Secondary | ICD-10-CM

## 2024-02-06 DIAGNOSIS — F411 Generalized anxiety disorder: Secondary | ICD-10-CM

## 2024-02-06 DIAGNOSIS — F4001 Agoraphobia with panic disorder: Secondary | ICD-10-CM

## 2024-02-06 MED ORDER — PAROXETINE HCL 40 MG PO TABS
80.0000 mg | ORAL_TABLET | Freq: Every day | ORAL | 0 refills | Status: DC
  Filled 2024-02-06: qty 120, 60d supply, fill #0

## 2024-02-07 ENCOUNTER — Encounter

## 2024-02-07 ENCOUNTER — Other Ambulatory Visit: Payer: Self-pay

## 2024-02-07 ENCOUNTER — Other Ambulatory Visit (HOSPITAL_BASED_OUTPATIENT_CLINIC_OR_DEPARTMENT_OTHER): Payer: Self-pay

## 2024-02-13 ENCOUNTER — Other Ambulatory Visit: Payer: Self-pay | Admitting: Family Medicine

## 2024-02-13 DIAGNOSIS — Z1231 Encounter for screening mammogram for malignant neoplasm of breast: Secondary | ICD-10-CM

## 2024-02-23 ENCOUNTER — Ambulatory Visit
Admission: RE | Admit: 2024-02-23 | Discharge: 2024-02-23 | Disposition: A | Discharge status: Home / Self Care | Source: Ambulatory Visit

## 2024-02-23 DIAGNOSIS — Z1231 Encounter for screening mammogram for malignant neoplasm of breast: Secondary | ICD-10-CM | POA: Diagnosis not present

## 2024-02-29 ENCOUNTER — Ambulatory Visit: Admitting: Neurology

## 2024-02-29 DIAGNOSIS — F411 Generalized anxiety disorder: Secondary | ICD-10-CM

## 2024-02-29 DIAGNOSIS — G4733 Obstructive sleep apnea (adult) (pediatric): Secondary | ICD-10-CM | POA: Diagnosis not present

## 2024-02-29 DIAGNOSIS — M2550 Pain in unspecified joint: Secondary | ICD-10-CM

## 2024-02-29 DIAGNOSIS — M25552 Pain in left hip: Secondary | ICD-10-CM

## 2024-02-29 DIAGNOSIS — G479 Sleep disorder, unspecified: Secondary | ICD-10-CM

## 2024-02-29 DIAGNOSIS — R0683 Snoring: Secondary | ICD-10-CM

## 2024-02-29 DIAGNOSIS — G4719 Other hypersomnia: Secondary | ICD-10-CM

## 2024-02-29 DIAGNOSIS — G4726 Circadian rhythm sleep disorder, shift work type: Secondary | ICD-10-CM

## 2024-03-01 NOTE — Progress Notes (Signed)
 [facility]   HOME SLEEP TEST REPORT ( by Watch PAT)   STUDY DATE:  03-30-2024/ data loaded on 03-01-2024   ORDERING CLINICIAN: Dedra Gores, MD  REFERRING CLINICIAN: Dr Cruz, DO  I have the pleasure of meeting with  Brooke [name], Brooke [name] on 12-27-2023, who is a 53 y.o.  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 This patient reports chronic insomnia and insufficient sleep , her mind is racing and she is treated for Anxiety and depression by Dr .Geoffry - her medications let to weight gain. She had problems with weight in the past , lost weight on Zepbound  to a total weight of 135 pounds and now finds this medication no longer covered.  Her anxiety increased on phentermine .  She feels depressed , today her  weight  is at 177 pounds. Further :  hip Arthritis, Back pain (12/13/2011), Bursitis, Constipation, Depression, Fatty liver, HLD (hyperlipidemia), Hyperglycemia (03/05/2013), Hypotension, stress Incontinence, Lower extremity edema, Lumbar stress fracture, Obesity, Osteoporosis, and SOB (shortness of breath)..   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  We look to see if OSA is present.    4)  day time work should be initiated, night shift avoided.  Regular sleep and wake times. Needs to be in control of her sleep time.    5) Insomnia: treatment by  cognitive behavior therapy , will ask for Dr Cottle's advise ( may be there is a therapist working with insomnia  in his provider group?) .    Epworth sleepiness score:19 / 24 points.    FSS endorsed at 46/ 63 points.     BMI: 31.35 kg/m   Neck Circumference: 15      Sleep Summary:   Total Recording Time (hours, min):     10 h 5 m   Total Sleep Time (hours, min):  9 h 23 m               Percent REM (%):  14.2%    The data collected  were confusing, as the patients sleep time was estimated at over 9 hours, yet true sleep time ( confirmed sleep time ) was less than 2 hours.                                      Respiratory Indices:   Calculated pAHI (per hour) with AASM criteria applied:   6.8/h                       REM pAHI:       0/h                                            NREM pAHI:       9.4/h                        Positional AHI:    In supine , the AHI  was 11.4/h and in  lateral ( right sided sleep ) position, AHI was 2.4/h  Snoring : mild, but consistent for about 90% of recorded sleep time (TST) , mean Volume at 41 dB.                                              Oxygen Saturation Statistics:   Oxygen Saturation (%) Mean:  94%             O2 Saturation Range (%):         90-97%                               O2 Saturation (minutes) <89%: 0 minutes          Pulse Rate Statistics:   Pulse Mean (bpm):   79 bpm              Pulse Range:    46- 113 bpm.   Caveat: This watch pat device does not provide data of cardiac rhythm.              IMPRESSION:  This HST confirms the presence of only  Mild obstructive sleep apnea (OSA)  This form of sleep apnea was not REM sleep dependent and was strongly positional dependent.  I recommend to avoid the supine sleep position as much as possible, in light of the reported hip and back pain. The overall apnea hypopnea index was mild- I can recommend treatment by CPAP as optional therapy, and also treatment by a dental device for mandibular advancement .      RECOMMENDATION: This patients degree of daytime sleepiness needs to be further investigated.  I can provide modafinil because the dx of OSA and shift work sleep disorder is sufficient to fulfill FDA criteria.   Dental device , CPAP are options for  mild sleep apnea , but the main treatment should be in changing the sleep position and in further weight loss.   If Nurse Highland is interested in an in-lab sleep  study to further investigate her symptoms, I am open to an in lab titration to CPAP or a general PSG.   Pain from hip and low back needs to be addressed , as well as any mood disorder, as these are cofactors in sleep quality reduction and daytime sleepiness / fatigue.   I recommend also to change to regular daytime employment if in any way possible.    The variability in heart rate was remarkable, there were several unexplained brief episodes of bradycardia - artefact ?  PCP to consider Zio Patch if not done in the recent past.     The mild degree of apnea ( OSA) will unfortunately not help this patient to obtain Zepbound  by FDA criteria.        Please note that untreated obstructive sleep apnea may carry additional perioperative morbidity. Patients with significant obstructive sleep apnea should receive perioperative PAP therapy and the surgical team should be informed of the diagnosis and degree of sleep disordered breathing.  Sleep fragmentation in the presence of normal proportional sleep stages is a nonspecific findings and per se does not signify an intrinsic sleep disorder or a cause for the patient's sleep-related symptoms.  Causes include (but are not limited to) the unfamiliarity of sleeping while recorded by HST device or sleeping in a sleep lab for a full Polysomnography sleep study, but also  circadian rhythm disturbances, medication side effects or an underlying mood disorder or medical problem.     INTERPRETING PHYSICIAN:   Dedra Gores, MD  [facility] and [facility] Sleep Board certified in Sleep Medicine by [employer] and Diplomate of the [employer] (AASM) . Board certified In Neurology, Diplomat of the ABPN,  Fellow of the [employer].

## 2024-03-05 ENCOUNTER — Other Ambulatory Visit: Payer: Self-pay | Admitting: Psychiatry

## 2024-03-05 ENCOUNTER — Other Ambulatory Visit (HOSPITAL_BASED_OUTPATIENT_CLINIC_OR_DEPARTMENT_OTHER): Payer: Self-pay

## 2024-03-05 DIAGNOSIS — F411 Generalized anxiety disorder: Secondary | ICD-10-CM

## 2024-03-05 DIAGNOSIS — F4001 Agoraphobia with panic disorder: Secondary | ICD-10-CM

## 2024-03-05 MED ORDER — PROPRANOLOL HCL 20 MG PO TABS
20.0000 mg | ORAL_TABLET | Freq: Every day | ORAL | 0 refills | Status: AC | PRN
  Filled 2024-03-05: qty 180, 90d supply, fill #0

## 2024-03-06 ENCOUNTER — Other Ambulatory Visit: Payer: Self-pay

## 2024-03-11 ENCOUNTER — Ambulatory Visit: Payer: Self-pay | Admitting: Neurology

## 2024-03-11 DIAGNOSIS — G4719 Other hypersomnia: Secondary | ICD-10-CM | POA: Insufficient documentation

## 2024-03-11 DIAGNOSIS — G4726 Circadian rhythm sleep disorder, shift work type: Secondary | ICD-10-CM | POA: Insufficient documentation

## 2024-03-11 NOTE — Procedures (Signed)
 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 This patient reports chronic insomnia and insufficient sleep , her mind is racing and she is treated for Anxiety and depression by Dr .Geoffry - her medications let to weight gain. She had problems with weight in the past , lost weight on Zepbound  to a total weight of 135 pounds and now finds this medication no longer covered.  Her anxiety increased on phentermine .  She feels depressed , today her  weight  is at 177 pounds. Further :  hip Arthritis, Back pain (12/13/2011), Bursitis, Constipation, Depression, Fatty liver, HLD (hyperlipidemia), Hyperglycemia (03/05/2013), Hypotension, stress Incontinence, Lower extremity edema, Lumbar stress fracture, Obesity, Osteoporosis, and SOB (shortness of breath)..   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  We look to see if OSA is present.    4)  day time work should be initiated, night shift avoided.  Regular sleep and wake times. Needs to be in control of her sleep time.    5) Insomnia: treatment by  cognitive behavior therapy , will ask for Dr Cottle's advise ( may be there is a therapist working with insomnia  in his provider group?) .    Epworth sleepiness score:19 / 24 points.    FSS endorsed at 46/ 63 points.     BMI: 31.35 kg/m   Neck Circumference: 15      Sleep Summary:   Total Recording Time (hours, min):     10 h 5 m   Total Sleep Time (hours, min):  9 h 23 m               Percent REM (%):  14.2%    The data collected  were confusing, as the patients sleep time was estimated at over 9 hours, yet true sleep time ( confirmed sleep time ) was less than 2 hours.                                      Respiratory Indices:   Calculated pAHI (per hour) with AASM criteria applied:   6.8/h                       REM pAHI:       0/h                                            NREM pAHI:       9.4/h                        Positional AHI:    In supine , the AHI  was 11.4/h and in  lateral ( right sided sleep ) position, AHI was 2.4/h  Snoring : mild, but consistent for about 90% of recorded sleep time (TST) , mean Volume at 41 dB.                                              Oxygen Saturation Statistics:   Oxygen Saturation (%) Mean:  94%             O2 Saturation Range (%):         90-97%                               O2 Saturation (minutes) <89%: 0 minutes          Pulse Rate Statistics:   Pulse Mean (bpm):   79 bpm              Pulse Range:    46- 113 bpm.   Caveat: This watch pat device does not provide data of cardiac rhythm.              IMPRESSION:  This HST confirms the presence of only  Mild obstructive sleep apnea (OSA)  This form of sleep apnea was not REM sleep dependent and was strongly positional dependent.  I recommend to avoid the supine sleep position as much as possible, in light of the reported hip and back pain. The overall apnea hypopnea index was mild- I can recommend treatment by CPAP as optional therapy, and also treatment by a dental device for mandibular advancement .      RECOMMENDATION:    Dental device , CPAP are both good treatment options for  mild sleep apnea , but the main treatment should be in changing the sleep position and in further weight loss.   If Nurse Hittle is interested in an in-lab sleep study to further investigate her symptoms, I am open to an in lab titration to CPAP or a general PSG.   Pain from hip and low back needs to be addressed , as well as any  mood disorder, as these are cofactors in sleep quality reduction and daytime sleepiness / fatigue.  This patients degree of daytime sleepiness needs to be further investigated.    I can provide symptomatic relief with modafinil because the dx of OSA and shift work sleep disorder is sufficient to fulfill FDA criteria.   I recommend also to change to regular daytime employment if in any way possible.    The variability in heart rate was remarkable, there were several unexplained brief episodes of bradycardia - artefact ?  PCP to consider Zio Patch if not done in the recent past.   The mild degree of apnea ( OSA) will unfortunately not help this patient to obtain Zepbound  by FDA criteria.        Please note that untreated obstructive sleep apnea may carry additional perioperative morbidity. Patients with significant obstructive sleep apnea should receive perioperative PAP therapy and the surgical team should be informed of the diagnosis and degree of sleep disordered breathing.   I am looking forward to Nurse Renville's response,     INTERPRETING PHYSICIAN:   Dedra Gores, MD  [facility] and [facility] Sleep Board certified in Sleep Medicine by [employer] and Diplomate of the [employer] (AASM) . Board certified In Neurology, Diplomat  of the ABPN,  Fellow of the [employer].

## 2024-03-12 ENCOUNTER — Ambulatory Visit (INDEPENDENT_AMBULATORY_CARE_PROVIDER_SITE_OTHER): Admitting: Family Medicine

## 2024-03-12 ENCOUNTER — Encounter: Payer: Self-pay | Admitting: Family Medicine

## 2024-03-12 ENCOUNTER — Other Ambulatory Visit (HOSPITAL_BASED_OUTPATIENT_CLINIC_OR_DEPARTMENT_OTHER): Payer: Self-pay

## 2024-03-12 VITALS — BP 98/80 | HR 98 | Temp 98.0°F | Resp 16 | Ht 63.0 in | Wt 178.4 lb

## 2024-03-12 DIAGNOSIS — E559 Vitamin D deficiency, unspecified: Secondary | ICD-10-CM | POA: Diagnosis not present

## 2024-03-12 DIAGNOSIS — Z23 Encounter for immunization: Secondary | ICD-10-CM | POA: Diagnosis not present

## 2024-03-12 DIAGNOSIS — Z0000 Encounter for general adult medical examination without abnormal findings: Secondary | ICD-10-CM | POA: Diagnosis not present

## 2024-03-12 LAB — COMPREHENSIVE METABOLIC PANEL WITH GFR
ALT: 27 U/L (ref 0–35)
AST: 26 U/L (ref 0–37)
Albumin: 4.7 g/dL (ref 3.5–5.2)
Alkaline Phosphatase: 107 U/L (ref 39–117)
BUN: 14 mg/dL (ref 6–23)
CO2: 24 meq/L (ref 19–32)
Calcium: 9.5 mg/dL (ref 8.4–10.5)
Chloride: 107 meq/L (ref 96–112)
Creatinine, Ser: 0.96 mg/dL (ref 0.40–1.20)
GFR: 66.64 mL/min (ref >60.00)
Glucose, Bld: 81 mg/dL (ref 70–99)
Potassium: 4.1 meq/L (ref 3.5–5.1)
Sodium: 142 meq/L (ref 135–145)
Total Bilirubin: 0.4 mg/dL (ref 0.2–1.2)
Total Protein: 6.8 g/dL (ref 6.0–8.3)

## 2024-03-12 LAB — LIPID PANEL
Cholesterol: 227 mg/dL — ABNORMAL HIGH (ref 0–200)
HDL: 48.5 mg/dL (ref >39.00)
LDL Cholesterol: 158 mg/dL — ABNORMAL HIGH (ref 0–99)
NonHDL: 178.72
Total CHOL/HDL Ratio: 5
Triglycerides: 102 mg/dL (ref 0.0–149.0)
VLDL: 20.4 mg/dL (ref 0.0–40.0)

## 2024-03-12 LAB — CBC WITH DIFFERENTIAL/PLATELET
Basophils Absolute: 0 K/uL (ref 0.0–0.1)
Basophils Relative: 0.5 % (ref 0.0–3.0)
Eosinophils Absolute: 0.3 K/uL (ref 0.0–0.7)
Eosinophils Relative: 6.5 % — ABNORMAL HIGH (ref 0.0–5.0)
HCT: 44.2 % (ref 36.0–46.0)
Hemoglobin: 15 g/dL (ref 12.0–15.0)
Lymphocytes Relative: 35.6 % (ref 12.0–46.0)
Lymphs Abs: 1.6 K/uL (ref 0.7–4.0)
MCHC: 33.9 g/dL (ref 30.0–36.0)
MCV: 91.4 fl (ref 78.0–100.0)
Monocytes Absolute: 0.3 K/uL (ref 0.1–1.0)
Monocytes Relative: 7.2 % (ref 3.0–12.0)
Neutro Abs: 2.3 K/uL (ref 1.4–7.7)
Neutrophils Relative %: 50.2 % (ref 43.0–77.0)
Platelets: 231 K/uL (ref 150.0–400.0)
RBC: 4.84 Mil/uL (ref 3.87–5.11)
RDW: 14 % (ref 11.5–15.5)
WBC: 4.6 K/uL (ref 4.0–10.5)

## 2024-03-12 LAB — VITAMIN D 25 HYDROXY (VIT D DEFICIENCY, FRACTURES): VITD: 46.96 ng/mL (ref 30.00–100.00)

## 2024-03-12 LAB — TSH: TSH: 1.12 u[IU]/mL (ref 0.35–5.50)

## 2024-03-12 MED ORDER — BOOSTRIX 5-2.5-18.5 LF-MCG/0.5 IM SUSY
PREFILLED_SYRINGE | INTRAMUSCULAR | 0 refills | Status: AC
  Filled 2024-03-12: qty 0.5, 1d supply, fill #0

## 2024-03-12 NOTE — Progress Notes (Signed)
 Subjective:    Patient ID: Brooke [name], female    DOB: 10-Dec-1970, 53 y.o.   MRN: [id number]  Chief Complaint  Patient presents with   Annual Exam    Pt states asting     HPI Patient is in today for cpe   Discussed the use of AI scribe software for clinical note transcription with the patient, who gave verbal consent to proceed.  History of Present Illness Brooke [name] is a 53 year old female with mild sleep apnea and anxiety who presents for a follow-up regarding her sleep issues and medication management.  She experiences palpitations and anxiety during sleep, particularly when on call. A sleep study recommended transitioning to daytime work to establish a regular sleep pattern. She uses sleep medication prescribed to aid her sleep but still struggles with disturbances.  She experiences joint pain, particularly in her hips, which she attributes to lack of sleep and inadequate body regeneration. She is currently taking Celebrex , which provides some relief, but believes her joint pain is exacerbated by her sleep issues. She recently purchased an adjustable bed, which has helped improve her sleep quality over the past few nights.  She is up to date with most vaccinations, having received her flu shot early in the season. She is uncertain about her shingles vaccination status, recalling receiving the first dose but unsure about the second. She plans to verify this with the pharmacy. She is due for a tetanus shot and is considering the pneumonia vaccine.  She is interested in potential cost reductions for weight loss medications and is exploring new job positions that would allow her to work regular hours and improve her sleep pattern.    Past Medical History:  Diagnosis Date   Anxiety    Arthritis    right shoulder   Back pain 12/13/2011   Bursitis    Constipation    Depression    Fatty liver    seen on US  04-2018   HLD (hyperlipidemia)    Hyperglycemia 03/05/2013    Hypotension    Incontinence    Lower extremity edema    Lumbar stress fracture    Obesity    Osteoporosis    SOB (shortness of breath)     Past Surgical History:  Procedure Laterality Date   BREAST BIOPSY Right 12/27/2018   NEG   COLONOSCOPY     EXCISION/RELEASE BURSA HIP Right 04/11/2019   Procedure: Right hip bursectomy;  Surgeon: Heide Ingle, MD;  Location: [facility];  Service: Orthopedics;  Laterality: Right;    RHINOPLASTY     SINUS ENDO W/FUSION     TONSILLECTOMY      Family History  Problem Relation Age of Onset   Colon polyps Mother    Thyroid  disease Mother    Hyperlipidemia Mother    Diabetes Mother    Hypertension Mother    Diabetes Father    Hypertension Father    Hyperlipidemia Father    Mental illness Sister    Diabetes Paternal Aunt    Hypertension Paternal Uncle    Diabetes Paternal Grandmother    Hypertension Paternal Grandmother    Hyperlipidemia Paternal Grandmother    Colon cancer Neg Hx    Esophageal cancer Neg Hx    Rectal cancer Neg Hx    Stomach cancer Neg Hx     Social History   Socioeconomic History   Marital status: Married    Spouse name: John   Number of children: Not on file  Years of education: 54   Highest education level: Associate degree: occupational, scientist, product/process development, or vocational program  Occupational History   Occupation: RN/short stay    Employer: [employer]  Tobacco Use   Smoking status: Never   Smokeless tobacco: Never  Vaping Use   Vaping status: Never Used  Substance and Sexual Activity   Alcohol use: No    Alcohol/week: 0.0 standard drinks of alcohol   Drug use: No   Sexual activity: Yes    Partners: Male    Birth control/protection: Surgical    Comment: Husband's vasectomy  Other Topics Concern   Not on file  Social History Narrative   Not on file   Social Drivers of Health   Financial Resource Strain: Low Risk  (03/11/2024)   Overall Financial Resource Strain (CARDIA)    Difficulty of Paying  Living Expenses: Not very hard  Food Insecurity: No Food Insecurity (03/11/2024)   Hunger Vital Sign    Worried About Running Out of Food in the Last Year: Never true    Ran Out of Food in the Last Year: Never true  Transportation Needs: No Transportation Needs (03/11/2024)   PRAPARE - Administrator, Civil Service (Medical): No    Lack of Transportation (Non-Medical): No  Physical Activity: Inactive (03/11/2024)   Exercise Vital Sign    Days of Exercise per Week: 0 days    Minutes of Exercise per Session: Not on file  Stress: Stress Concern Present (03/11/2024)   [employer] of Occupational Health - Occupational Stress Questionnaire    Feeling of Stress: To some extent  Social Connections: Socially Integrated (03/11/2024)   Social Connection and Isolation Panel    Frequency of Communication with Friends and Family: More than three times a week    Frequency of Social Gatherings with Friends and Family: Twice a week    Attends Religious Services: More than 4 times per year    Active Member of [employer] or Organizations: Yes    Attends Engineer, Structural: More than 4 times per year    Marital Status: Married  Catering Manager Violence: Not on file    Outpatient Medications Prior to Visit  Medication Sig Dispense Refill   acetaminophen  (TYLENOL ) 500 MG tablet Take 1,000 mg by mouth every 6 (six) hours as needed for moderate pain.     alendronate  (FOSAMAX ) 70 MG tablet Take 1 tablet (70 mg total) by mouth every 7 (seven) days. Take with a full glass of water on an empty stomach. 12 tablet 3   ALPRAZolam  (ALPRAZOLAM  XR) 0.5 MG 24 hr tablet Take 1 tablet (0.5 mg total) by mouth in the morning and 2 tablets (1 mg total) by mouth at night. 270 tablet 1   AMBULATORY NON FORMULARY MEDICATION Medication Name: Diltiazem 2% compound with Lidocaine  5% 30 g 0   ARIPiprazole  (ABILIFY ) 15 MG tablet Take 1 tablet (15 mg total) by mouth daily. 90 tablet 0   Calcium  Carb-Cholecalciferol  (CALCIUM 600+D) 600-800 MG-UNIT TABS Take 2 tablets by mouth daily.     celecoxib  (CELEBREX ) 100 MG capsule Take 1 capsule (100 mg total) by mouth 2 (two) times daily. 60 capsule 5   ibuprofen  (ADVIL ) 200 MG tablet Take 800 mg by mouth every 6 (six) hours as needed for moderate pain or headache.     PARoxetine  (PAXIL ) 40 MG tablet Take 2 tablets (80 mg total) by mouth at bedtime. 120 tablet 0   propranolol  (INDERAL ) 20 MG tablet Take 1-2  tablets (20-40 mg total) by mouth daily as needed (anxiety). 180 tablet 0   topiramate  (TOPAMAX ) 50 MG tablet Take 2 tablets (100 mg total) by mouth 2 (two) times daily. 360 tablet 1   traZODone  (DESYREL ) 50 MG tablet Take 2 tablets (100 mg total) by mouth at bedtime. 180 tablet 0   Vitamin D , Ergocalciferol , (DRISDOL ) 1.25 MG (50000 UNIT) CAPS capsule Take 1 capsule (50,000 Units total) by mouth every 7 (seven) days. 12 capsule 1   zolpidem  (AMBIEN ) 10 MG tablet Take 1 tablet (10 mg total) by mouth at bedtime as needed for sleep. 90 tablet 1   Armodafinil  200 MG TABS Take 1 tablet (200 mg total) by mouth daily. 30 tablet 0   azelastine  (ASTELIN ) 0.1 % nasal spray Place 1 spray into both nostrils 2 (two) times daily. Use in each nostril as directed 30 mL 12   fluticasone  (FLONASE ) 50 MCG/ACT nasal spray Place 2 sprays into both nostrils daily. (Patient taking differently: Place 2 sprays into both nostrils daily as needed for allergies or rhinitis.) 16 g 6   polyethylene glycol (MIRALAX  / GLYCOLAX ) 17 g packet Take 17 g by mouth daily. (Patient taking differently: Take 17 g by mouth daily as needed for mild constipation.)     amoxicillin  (AMOXIL ) 500 MG capsule Take 1 capsule (500 mg total) by mouth 3 (three) times daily until gone. 30 capsule 1   No facility-administered medications prior to visit.    No Known Allergies  Review of Systems  Constitutional:  Negative for fever and malaise/fatigue.  HENT:  Negative for congestion.   Eyes:   Negative for blurred vision.  Respiratory:  Negative for cough and shortness of breath.   Cardiovascular:  Negative for chest pain, palpitations and leg swelling.  Gastrointestinal:  Negative for abdominal pain, blood in stool, nausea and vomiting.  Genitourinary:  Negative for dysuria and frequency.  Musculoskeletal:  Negative for back pain and falls.  Skin:  Negative for rash.  Neurological:  Negative for dizziness, loss of consciousness and headaches.  Endo/Heme/Allergies:  Negative for environmental allergies.  Psychiatric/Behavioral:  Negative for depression. The patient is not nervous/anxious.        Objective:    Physical Exam Vitals and nursing note reviewed.  Constitutional:      General: She is not in acute distress.    Appearance: Normal appearance. She is well-developed.  HENT:     Head: Normocephalic and atraumatic.     Right Ear: Tympanic membrane, ear canal and external ear normal. There is no impacted cerumen.     Left Ear: Tympanic membrane, ear canal and external ear normal. There is no impacted cerumen.     Nose: Nose normal.     Mouth/Throat:     Mouth: Mucous membranes are moist.     Pharynx: Oropharynx is clear. No oropharyngeal exudate or posterior oropharyngeal erythema.  Eyes:     General: No scleral icterus.       Right eye: No discharge.        Left eye: No discharge.     Conjunctiva/sclera: Conjunctivae normal.     Pupils: Pupils are equal, round, and reactive to light.  Neck:     Thyroid : No thyromegaly or thyroid  tenderness.     Vascular: No JVD.  Cardiovascular:     Rate and Rhythm: Normal rate and regular rhythm.     Heart sounds: Normal heart sounds. No murmur heard. Pulmonary:     Effort: Pulmonary effort is normal.  No respiratory distress.     Breath sounds: Normal breath sounds.  Abdominal:     General: Bowel sounds are normal. There is no distension.     Palpations: Abdomen is soft. There is no mass.     Tenderness: There is no  abdominal tenderness. There is no guarding or rebound.  Genitourinary:    Vagina: Normal.  Musculoskeletal:        General: Normal range of motion.     Cervical back: Normal range of motion and neck supple.     Right lower leg: No edema.     Left lower leg: No edema.  Lymphadenopathy:     Cervical: No cervical adenopathy.  Skin:    General: Skin is warm and dry.     Findings: No erythema or rash.  Neurological:     Mental Status: She is alert and oriented to person, place, and time.     Cranial Nerves: No cranial nerve deficit.     Deep Tendon Reflexes: Reflexes are normal and symmetric.  Psychiatric:        Mood and Affect: Mood normal.        Behavior: Behavior normal.        Thought Content: Thought content normal.        Judgment: Judgment normal.     BP 98/80 (BP Location: Right Arm, Patient Position: Sitting, Cuff Size: Large)   Pulse 98   Temp 98 F (36.7 C) (Oral)   Resp 16   Ht 5' 3 (1.6 m)   Wt 178 lb 6.4 oz (80.9 kg)   LMP 04/03/2018   SpO2 96%   BMI 31.60 kg/m  Wt Readings from Last 3 Encounters:  03/12/24 178 lb 6.4 oz (80.9 kg)  12/27/23 177 lb (80.3 kg)  12/13/23 174 lb (78.9 kg)    Diabetic Foot Exam - Simple   No data filed    Lab Results  Component Value Date   WBC 4.6 03/12/2024   HGB 15.0 03/12/2024   HCT 44.2 03/12/2024   PLT 231.0 03/12/2024   GLUCOSE 81 03/12/2024   CHOL 227 (H) 03/12/2024   TRIG 102.0 03/12/2024   HDL 48.50 03/12/2024   LDLDIRECT 85.0 11/08/2023   LDLCALC 158 (H) 03/12/2024   ALT 27 03/12/2024   AST 26 03/12/2024   NA 142 03/12/2024   K 4.1 03/12/2024   CL 107 03/12/2024   CREATININE 0.96 03/12/2024   BUN 14 03/12/2024   CO2 24 03/12/2024   TSH 1.12 03/12/2024   INR 0.9 04/10/2019   HGBA1C 5.4 12/31/2021    Lab Results  Component Value Date   TSH 1.12 03/12/2024   Lab Results  Component Value Date   WBC 4.6 03/12/2024   HGB 15.0 03/12/2024   HCT 44.2 03/12/2024   MCV 91.4 03/12/2024   PLT 231.0  03/12/2024   Lab Results  Component Value Date   NA 142 03/12/2024   K 4.1 03/12/2024   CO2 24 03/12/2024   GLUCOSE 81 03/12/2024   BUN 14 03/12/2024   CREATININE 0.96 03/12/2024   BILITOT 0.4 03/12/2024   ALKPHOS 107 03/12/2024   AST 26 03/12/2024   ALT 27 03/12/2024   PROT 6.8 03/12/2024   ALBUMIN 4.7 03/12/2024   CALCIUM 9.5 03/12/2024   ANIONGAP 7 11/19/2020   EGFR 77 08/06/2021   GFR 66.64 03/12/2024   Lab Results  Component Value Date   CHOL 227 (H) 03/12/2024   Lab Results  Component Value Date  HDL 48.50 03/12/2024   Lab Results  Component Value Date   LDLCALC 158 (H) 03/12/2024   Lab Results  Component Value Date   TRIG 102.0 03/12/2024   Lab Results  Component Value Date   CHOLHDL 5 03/12/2024   Lab Results  Component Value Date   HGBA1C 5.4 12/31/2021       Assessment & Plan:  Preventative health care Assessment & Plan: Ghm utd Check labs See AVS Health Maintenance  Topic Date Due   HIV Screening  Never done   Hepatitis B Vaccines 19-59 Average Risk (1 of 3 - 19+ 3-dose series) Never done   Zoster Vaccines- Shingrix  (2 of 2) 90/81/7979   COVID-19 Vaccine (5 - 2025-26 season) 03/28/2024 (Originally 01/02/2024)   Mammogram  02/22/2026   Cervical Cancer Screening (HPV/Pap Cotest)  01/01/2027   Colonoscopy  10/23/2027   DTaP/Tdap/Td (4 - Td or Tdap) 03/12/2034   Pneumococcal Vaccine: 50+ Years  Completed   Influenza Vaccine  Completed   Hepatitis C Screening  Completed   HPV VACCINES  Aged Out   Meningococcal B Vaccine  Aged Out      Orders: -     CBC with Differential/Platelet -     Comprehensive metabolic panel with GFR -     Lipid panel -     TSH  Vitamin D  deficiency -     VITAMIN D  25 Hydroxy (Vit-D Deficiency, Fractures)  Need for Tdap vaccination -     Pneumococcal conjugate vaccine 20-valent    Jamee Brooke Antonio Cyndee, DO

## 2024-03-13 NOTE — Telephone Encounter (Signed)
 Very mild apnea and strictly related to sleeping on her back.  Variable heart rate ( anxiety? Palpitations ? ) . First line treatment is avoiding sleep on the back.  Given the shift work situation and joint pain, I feel strongly that the main intervention will need to be a change to regular daytime work and night time sleep, treatment of pain and anxiety.    Mild OSA : I can offer CPAP or a dental device as optional treatments.  If the patient feels that the HST does not reflect the true sleep pattern, we can also offer an in lab sleep study.  Written by Dedra Gores, MD on 03/11/2024  2:21 PM EST Seen by patient Brooke [name] on 03/12/2024  4:46 PM

## 2024-03-13 NOTE — Telephone Encounter (Signed)
-----   Message from [place] Dohmeier sent at 03/11/2024  2:21 PM EST ----- Very mild apnea and strictly related to sleeping on her back.  Variable heart rate ( anxiety? Palpitations ? ) . First line treatment is avoiding sleep on the back.  Given the shift work situation and joint pain, I feel strongly that the main intervention will need to be a change to regular daytime work and night time sleep, treatment of pain and anxiety.   Mild OSA : I can offer CPAP or a dental device as optional treatments.  If the patient feels that the HST does not reflect the true sleep pattern, we can also offer an in lab sleep study.  ----- Message ----- From: Chalice Saunas, MD Sent: 03/11/2024   2:02 PM EST To: Saunas Chalice, MD

## 2024-03-22 ENCOUNTER — Ambulatory Visit: Payer: Self-pay | Admitting: Family Medicine

## 2024-03-22 DIAGNOSIS — E785 Hyperlipidemia, unspecified: Secondary | ICD-10-CM

## 2024-03-22 NOTE — Assessment & Plan Note (Signed)
 Ghm utd Check labs See AVS Health Maintenance  Topic Date Due   HIV Screening  Never done   Hepatitis B Vaccines 19-59 Average Risk (1 of 3 - 19+ 3-dose series) Never done   Zoster Vaccines- Shingrix  (2 of 2) 01/19/2019   COVID-19 Vaccine (5 - 2025-26 season) 03/28/2024 (Originally 01/02/2024)   Mammogram  02/22/2026   Cervical Cancer Screening (HPV/Pap Cotest)  01/01/2027   Colonoscopy  10/23/2027   DTaP/Tdap/Td (4 - Td or Tdap) 03/12/2034   Pneumococcal Vaccine: 50+ Years  Completed   Influenza Vaccine  Completed   Hepatitis C Screening  Completed   HPV VACCINES  Aged Out   Meningococcal B Vaccine  Aged Out

## 2024-04-02 ENCOUNTER — Other Ambulatory Visit: Payer: Self-pay

## 2024-04-02 ENCOUNTER — Other Ambulatory Visit (HOSPITAL_BASED_OUTPATIENT_CLINIC_OR_DEPARTMENT_OTHER): Payer: Self-pay

## 2024-04-02 ENCOUNTER — Other Ambulatory Visit: Payer: Self-pay | Admitting: Psychiatry

## 2024-04-02 DIAGNOSIS — F5105 Insomnia due to other mental disorder: Secondary | ICD-10-CM

## 2024-04-02 DIAGNOSIS — F411 Generalized anxiety disorder: Secondary | ICD-10-CM

## 2024-04-02 DIAGNOSIS — F4001 Agoraphobia with panic disorder: Secondary | ICD-10-CM

## 2024-04-02 DIAGNOSIS — F3342 Major depressive disorder, recurrent, in full remission: Secondary | ICD-10-CM

## 2024-04-02 MED ORDER — PAROXETINE HCL 40 MG PO TABS
80.0000 mg | ORAL_TABLET | Freq: Every day | ORAL | 0 refills | Status: AC
  Filled 2024-04-02 (×2): qty 180, 90d supply, fill #0

## 2024-04-02 MED ORDER — TRAZODONE HCL 50 MG PO TABS
100.0000 mg | ORAL_TABLET | Freq: Every evening | ORAL | 0 refills | Status: AC
  Filled 2024-04-02: qty 180, 90d supply, fill #0

## 2024-04-04 ENCOUNTER — Ambulatory Visit (INDEPENDENT_AMBULATORY_CARE_PROVIDER_SITE_OTHER): Admitting: Psychiatry

## 2024-04-04 ENCOUNTER — Encounter: Payer: Self-pay | Admitting: Psychiatry

## 2024-04-04 ENCOUNTER — Other Ambulatory Visit (HOSPITAL_BASED_OUTPATIENT_CLINIC_OR_DEPARTMENT_OTHER): Payer: Self-pay

## 2024-04-04 ENCOUNTER — Other Ambulatory Visit: Payer: Self-pay

## 2024-04-04 DIAGNOSIS — G4726 Circadian rhythm sleep disorder, shift work type: Secondary | ICD-10-CM

## 2024-04-04 DIAGNOSIS — F3342 Major depressive disorder, recurrent, in full remission: Secondary | ICD-10-CM

## 2024-04-04 DIAGNOSIS — F411 Generalized anxiety disorder: Secondary | ICD-10-CM

## 2024-04-04 DIAGNOSIS — F5105 Insomnia due to other mental disorder: Secondary | ICD-10-CM

## 2024-04-04 DIAGNOSIS — F4001 Agoraphobia with panic disorder: Secondary | ICD-10-CM

## 2024-04-04 MED ORDER — ARIPIPRAZOLE 20 MG PO TABS
20.0000 mg | ORAL_TABLET | Freq: Every day | ORAL | 0 refills | Status: AC
  Filled 2024-04-04: qty 90, 90d supply, fill #0

## 2024-04-04 NOTE — Progress Notes (Signed)
 [name] [id number] 31-May-1970 53 y.o.  Subjective:   Patient ID:  Brooke [name] is a 53 y.o. (DOB [birth year]) female.  Chief Complaint:  Chief Complaint  Patient presents with   Follow-up   Anxiety   Depression    HPI Brooke [name] presents to the office today for follow-up of anxiety and insomnia  seen December 2019.  For anxiety and weight gain topiramate  was increased off label to 200 mg daily.  For anxiety propranolol  20 to 40 mg twice daily was added as needed.    seen November 2020: No meds were changed.  The following was noted: OK except for life events.  Still a lot of worry. Managed normally.  H agrees also.  Son's wife pregnant with another man's baby.  Real busy with 60 hours weekly.  Can't take Ambien  at times bc of that.  Realizes she needs to take better self care.   Brooke [name] August 23, 2019 appointment, the following is noted: Still doing well with meds.  Cont intermittent problems with work affecting sleep patterns and which sleep meds she can take.  Satisfied with meds.  No panic.  depression and anxiety manageable not gone.  Panic worse if overworked DT short staffed.  Patient reports stable mood and denies depressed or irritable moods.  Patient reports occasional difficulty with anxiety.  Patient reports occasionally difficulty with sleep initiation or maintenance. Denies appetite disturbance.  Patient reports that energy and motivation have been good.  Patient denies any difficulty with concentration.  Patient denies any suicidal ideation.  04/10/20 appt with following noted: Lost 33# at [facility] on 1000 calorie diet and Wegovy . A little extra anxiety lately without any specific reason for 2-3 mos.  No panic.  Mostly worry  Sx without avoidance.  Able to enjoy things normally.  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   Patient reports stable mood and denies  depressed or irritable moods.  Patient denies difficulty with sleep initiation or maintenance. Denies appetite disturbance.  Patient reports that energy and motivation have been good.  Patient denies any difficulty with concentration.  Patient denies any suicidal ideation. Plan: Continue alprazolam  XR 0.5 mg every morning and 1 mg nightly increase aripiprazole  10 mg daily to see anxiety is better Continue paroxetine  80 mg daily Continue propranolol  20 mg tablets 1-2 twice daily as needed anxiety Continue topiramate  50 mg tablets 2 twice daily Continue trazodone  100 mg nightly Continue zolpidem  5 to 10 mg nightly as needed insomnia She typically will alternate trazodone  or Ambien  depending on her work schedule.  10/09/20 appt noted: Good.  Lost 50# pounds and feels a whole lot better. Using Wegovy  Potential shoulder surgery. No panic.  Anxiety has been controlled.  Not depressed.   Increase in Abilify  helped that a lot. No SE. Still shift work.  Low vitamin D .   No concerns about meds and no reason for changes. Doing well. Plan: No med changes  04/09/2021 appointment with the following noted: Good normal stressors worse this time of year. Propranolol  used prn is helpful for anxiety physically and tolerated. No SE with meds. Still She typically will alternate trazodone  or Ambien  depending on her work schedule. Sleep ok with meds mostly. Working dayshift OR, clinical biochemist.  Long hours. Lost down to 137 from 190#.  Makes her feel better about herself.  On Wegovy .  [name]. No desire for med  changes Will get labs done. Plan: Continue alprazolam  XR 0.5 mg every morning and 1 mg nightly Continue aripiprazole  10 mg daily  the increase helped anxiety is better Continue paroxetine  80 mg daily Continue propranolol  20 mg tablets 1-2 twice daily as needed anxiety helpful prn Continue topiramate  50 mg tablets 2 twice daily Continue trazodone  100 mg nightly Continue zolpidem  5 to 10 mg nightly  as needed insomnia  10/08/21 appt noted: Good overall .  [name] anxiety with stressors.  No panic. Patient reports stable mood and denies depressed or irritable moods.   Patient denies difficulty with sleep initiation or maintenance. Denies appetite disturbance.  Patient reports that energy and motivation have been good.  Patient denies any difficulty with concentration.  Patient denies any suicidal ideation. Dayshift generally. Propranolol  helps  09/28/22 appt noted: Pretty good with a little added family anxiety.   Manageable. Sleep is ok.   53 yo [redacted] week pregnant.   She will be there to help with baby. No sig panic.  No sig avoidance. No SE .   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  04/04/23 appt noted: Meds:  alprazolam  XR 0.5 mg AM and 1 mg HS, paroxetine  80, topiramate  100 mg BID, trazodone  100 mg HS, Ambien  10 HS.  Abilify  10 Wt gain but  No panic except for phentermine  and stopped it.   Does well with propranolol  prn anxiety.   Interested in trying Vraylar instead of Abilify  to see if can lose wt. Recognizes Paxil  can cause wt gain but tried the most reasonable alternatives.   Patient reports stable mood and denies depressed or irritable moods.  Patient denies any recent difficulty with anxiety.  Patient denies difficulty with sleep initiation or maintenance. Denies appetite disturbance.  Patient reports that energy and motivation have been good.  Patient denies any difficulty with concentration.  Patient denies any suicidal ideation. Plan: Stop Abilify  Trial Vraylar to see if wt loss can occur.  1.5 mg daily.  11/07/23 appt noted: Meds:  alprazolam  XR 0.5 mg HS and prn, paroxetine  80, topiramate  100 mg BID, trazodone  100 mg HS, Ambien  10 HS.  Abilify  10 Anxiety up for a month.  Was triggered.  F with health issues.  F can't accept hi slimitations and gets himself in trouble physically.  Spends a lot of nights  sitting up worrying.  Also stress D and her BF keeping house in a mess.   1 full panic about 3 weeks ago.  At home more and it's good.   Job of on call 3 weeks.  Doesn't take Ambien  then.   Anxiety interfering with sleep but per H does better sleeping during the day.  Brain won't turn off without something to help her sleep.   Stays tired a lot.   Wonders about FM.  Avg 4-5 hours sleep.  Plan: increase alprazolam  XR 0.5 mg every 12 hours  01/26/24 appt noted:  Meds:  alprazolam  XR 0.5 mg BID, paroxetine  80, topiramate  100 mg BID, trazodone  100 mg HS, Ambien  10 HS.  Abilify  10 Better but not controlled.  Still pretty anxious.  No SE. Wants to increase Abilify  for anxiety.    Still a lot of sleep disturbances but trying to get off night schedule to get better sleep quality.  This would get her off call 3 weeks/month.   Not sig dep.   Pending sleep study. Regained wt off GLP-1s.  Disc her concerns about  D's compliance.   Plan: Increase Abilify  15.   04/04/24 appt noted:  Meds:  alprazolam  XR 0.5 mg 1-2 tab HS, paroxetine  80, topiramate  100 mg BID, trazodone  100 mg HS, Ambien  10 HS.  Abilify  15, takes prn propranolol  helpful Xanax  0.5 mg during day makes her too sleepy if doesn't get enough sleep Increase Abilify  seemed to help a good amount with anxiety.   Sleep study showed heart palpitations.  Dx mild OSA.  Baseline pulse 100 or more but no sig daytime palpitations. Working to change to day shift.  Interview pending.   Tolerated med change and helped my anxiety a lot.  Wants to increase Abilify  again to see if further benefit.    No SE.  No excessive sleepiness with Xanax   and sometimes only 2 daily.  Past Psychiatric Medication Trials: Citalopram, fluoxetine, fluvoxamine, and Anafranil, paroxetine  80, duloxetine Zoloft during pregnancy Abilify  10 Buspirone Topiramate  Ambien , Ambien  CR, trazodone  Xanax  XR Phentermine  anxiety  Review of Systems:  Review of Systems   Constitutional:  Positive for fatigue.  Cardiovascular:  Positive for palpitations.  Musculoskeletal:  Negative for arthralgias.       Pending shoulder surgery  Neurological:  Negative for dizziness and tremors.  Psychiatric/Behavioral:  Positive for sleep disturbance. Negative for agitation, behavioral problems, confusion, decreased concentration, dysphoric mood, hallucinations, self-injury and suicidal ideas. The patient is nervous/anxious. The patient is not hyperactive.     Medications: I have reviewed the patient's current medications.  Current Outpatient Medications  Medication Sig Dispense Refill   acetaminophen  (TYLENOL ) 500 MG tablet Take 1,000 mg by mouth every 6 (six) hours as needed for moderate pain.     alendronate  (FOSAMAX ) 70 MG tablet Take 1 tablet (70 mg total) by mouth every 7 (seven) days. Take with a full glass of water on an empty stomach. 12 tablet 3   ALPRAZolam  (ALPRAZOLAM  XR) 0.5 MG 24 hr tablet Take 1 tablet (0.5 mg total) by mouth in the morning and 2 tablets (1 mg total) by mouth at night. 270 tablet 1   AMBULATORY NON FORMULARY MEDICATION Medication Name: Diltiazem 2% compound with Lidocaine  5% 30 g 0   ARIPiprazole  (ABILIFY ) 20 MG tablet Take 1 tablet (20 mg total) by mouth daily. 90 tablet 0   Calcium Carb-Cholecalciferol  (CALCIUM 600+D) 600-800 MG-UNIT TABS Take 2 tablets by mouth daily.     celecoxib  (CELEBREX ) 100 MG capsule Take 1 capsule (100 mg total) by mouth 2 (two) times daily. 60 capsule 5   ibuprofen  (ADVIL ) 200 MG tablet Take 800 mg by mouth every 6 (six) hours as needed for moderate pain or headache.     PARoxetine  (PAXIL ) 40 MG tablet Take 2 tablets (80 mg total) by mouth at bedtime. 180 tablet 0   propranolol  (INDERAL ) 20 MG tablet Take 1-2 tablets (20-40 mg total) by mouth daily as needed (anxiety). 180 tablet 0   Tdap (BOOSTRIX ) 5-2.5-18.5 LF-MCG/0.5 injection Inject 0.77ml into the muscle. 0.5 mL 0   topiramate  (TOPAMAX ) 50 MG tablet Take 2  tablets (100 mg total) by mouth 2 (two) times daily. 360 tablet 1   traZODone  (DESYREL ) 50 MG tablet Take 2 tablets (100 mg total) by mouth at bedtime. 180 tablet 0   Vitamin D , Ergocalciferol , (DRISDOL ) 1.25 MG (50000 UNIT) CAPS capsule Take 1 capsule (50,000 Units total) by mouth every 7 (seven) days. 12 capsule 1   zolpidem  (AMBIEN ) 10 MG tablet Take 1 tablet (10 mg total) by mouth at bedtime as needed for sleep. 90  tablet 1   No current facility-administered medications for this visit.    Medication Side Effects: None  Allergies: No Known Allergies  Past Medical History:  Diagnosis Date   Anxiety    Arthritis    right shoulder   Back pain 12/13/2011   Bursitis    Constipation    Depression    Fatty liver    seen on US  04-2018   HLD (hyperlipidemia)    Hyperglycemia 03/05/2013   Hypotension    Incontinence    Lower extremity edema    Lumbar stress fracture    Obesity    Osteoporosis    SOB (shortness of breath)     Family History  Problem Relation Age of Onset   Colon polyps Mother    Thyroid  disease Mother    Hyperlipidemia Mother    Diabetes Mother    Hypertension Mother    Diabetes Father    Hypertension Father    Hyperlipidemia Father    Mental illness Sister    Diabetes Paternal Aunt    Hypertension Paternal Uncle    Diabetes Paternal Grandmother    Hypertension Paternal Grandmother    Hyperlipidemia Paternal Grandmother    Colon cancer Neg Hx    Esophageal cancer Neg Hx    Rectal cancer Neg Hx    Stomach cancer Neg Hx     Social History   Socioeconomic History   Marital status: Married    Spouse name: John   Number of children: Not on file   Years of education: 16   Highest education level: Associate degree: occupational, scientist, product/process development, or vocational program  Occupational History   Occupation: RN/short stay    Employer: [employer]  Tobacco Use   Smoking status: Never   Smokeless tobacco: Never  Vaping Use   Vaping status: Never Used   Substance and Sexual Activity   Alcohol use: No    Alcohol/week: 0.0 standard drinks of alcohol   Drug use: No   Sexual activity: Yes    Partners: Male    Birth control/protection: Surgical    Comment: Husband's vasectomy  Other Topics Concern   Not on file  Social History Narrative   Not on file   Social Drivers of Health   Financial Resource Strain: Low Risk  (03/11/2024)   Overall Financial Resource Strain (CARDIA)    Difficulty of Paying Living Expenses: Not very hard  Food Insecurity: No Food Insecurity (03/11/2024)   Hunger Vital Sign    Worried About Running Out of Food in the Last Year: Never true    Ran Out of Food in the Last Year: Never true  Transportation Needs: No Transportation Needs (03/11/2024)   PRAPARE - Administrator, Civil Service (Medical): No    Lack of Transportation (Non-Medical): No  Physical Activity: Inactive (03/11/2024)   Exercise Vital Sign    Days of Exercise per Week: 0 days    Minutes of Exercise per Session: Not on file  Stress: Stress Concern Present (03/11/2024)   [employer] of Occupational Health - Occupational Stress Questionnaire    Feeling of Stress: To some extent  Social Connections: Socially Integrated (03/11/2024)   Social Connection and Isolation Panel    Frequency of Communication with Friends and Family: More than three times a week    Frequency of Social Gatherings with Friends and Family: Twice a week    Attends Religious Services: More than 4 times per year    Active Member of [employer] or Organizations:  Yes    Attends Banker Meetings: More than 4 times per year    Marital Status: Married  Catering Manager Violence: Not on file    Past Medical History, Surgical history, Social history, and Family history were reviewed and updated as appropriate.   Please see review of systems for further details on the patient's review from today.   Objective:   Physical Exam:  LMP 04/03/2018   Physical  Exam Constitutional:      General: She is not in acute distress.    Appearance: Normal appearance.  Musculoskeletal:        General: No deformity.  Neurological:     Mental Status: She is alert and oriented to person, place, and time.     Coordination: Coordination normal.     Gait: Gait normal.  Psychiatric:        Attention and Perception: She is attentive.        Mood and Affect: Mood is anxious. Mood is not depressed. Affect is not labile, blunt, angry or inappropriate.        Speech: Speech normal.        Behavior: Behavior normal.        Thought Content: Thought content normal. Thought content is not delusional. Thought content does not include homicidal or suicidal ideation. Thought content does not include suicidal plan.        Cognition and Memory: Cognition normal.        Judgment: Judgment normal.     Comments: Insight intact. No auditory or visual hallucinations. No delusions.  Anxiety better     Lab Review:     Component Value Date/Time   NA 142 03/12/2024 0952   NA 144 08/06/2021 1022   K 4.1 03/12/2024 0952   CL 107 03/12/2024 0952   CO2 24 03/12/2024 0952   GLUCOSE 81 03/12/2024 0952   BUN 14 03/12/2024 0952   BUN 14 08/06/2021 1022   CREATININE 0.96 03/12/2024 0952   CREATININE 1.10 (H) 12/18/2021 1601   CALCIUM 9.5 03/12/2024 0952   PROT 6.8 03/12/2024 0952   PROT 6.1 08/06/2021 1022   ALBUMIN 4.7 03/12/2024 0952   ALBUMIN 4.3 08/06/2021 1022   AST 26 03/12/2024 0952   ALT 27 03/12/2024 0952   ALKPHOS 107 03/12/2024 0952   BILITOT 0.4 03/12/2024 0952   BILITOT 0.2 08/06/2021 1022   GFRNONAA >60 11/19/2020 0907   GFRAA 89 04/08/2020 0851       Component Value Date/Time   WBC 4.6 03/12/2024 0952   RBC 4.84 03/12/2024 0952   HGB 15.0 03/12/2024 0952   HGB 14.7 08/06/2021 1022   HCT 44.2 03/12/2024 0952   HCT 44.5 08/06/2021 1022   PLT 231.0 03/12/2024 0952   PLT 210 08/06/2021 1022   MCV 91.4 03/12/2024 0952   MCV 93 08/06/2021 1022   MCH  31.9 12/18/2021 1601   MCHC 33.9 03/12/2024 0952   RDW 14.0 03/12/2024 0952   RDW 12.7 08/06/2021 1022   LYMPHSABS 1.6 03/12/2024 0952   LYMPHSABS 1.2 08/06/2021 1022   MONOABS 0.3 03/12/2024 0952   EOSABS 0.3 03/12/2024 0952   EOSABS 0.0 08/06/2021 1022   BASOSABS 0.0 03/12/2024 0952   BASOSABS 0.0 08/06/2021 1022    No results found for: POCLITH, LITHIUM   No results found for: PHENYTOIN, PHENOBARB, VALPROATE, CBMZ   .res Assessment: Plan:    Generalized anxiety disorder  Panic disorder with agoraphobia  Insomnia due to mental condition  Major depression, recurrent,  full remission - Plan: ARIPiprazole  (ABILIFY ) 20 MG tablet  Shift work sleep disorder   Overall med combo has worked well for depression and anxiety but is better than last time...  Disc options for benefit more: increase Abilify , topiramate  off label, prn propranolol . Disc SE in detail of each option.   Doing ok without panic with less Xanax  XR for now.  Discussed potential metabolic side effects associated with atypical antipsychotics, as well as potential risk for movement side effects. Advised pt to contact office if movement side effects occur.  No AIM  We discussed the short-term risks associated with benzodiazepines including sedation and increased fall risk among others.  Discussed long-term side effect risk including dependence, potential withdrawal symptoms, and the potential eventual dose-related risk of dementia.  Newer studies published 2020 dispute risk of dementia with benzodiazepines.  And disc risk of Ambien  amnesia.  Consider switch to Ativan from Xanax  to reduce the sedation from Bz.    Better self care and more sleep would help. Protect SLEEP.  Disc difficulty with night shift.  She is working on getting off night shift.  Disc borderline low vitamin D  levels and psych literature and immune studies suggesting goal of mid range vit d levels.  continue topiramate  bc help weight gain  risk to 200mg  daily.  Disc SE.  Rx prn propranolol  20-40 mg BID for anxiety.   Disc polypharmacy is medcially necessary and has been helpful.  It has taken a lot of meds and combination of mechanisms in order to manage her depression and anxiety symptoms.  She tolerates the higher dose of paroxetine  without side effects.  Panic was not managed with less paroxetine . Disc purpose of each med.  Disc SE in detail and SSRI withdrawal sx. Discussed serotonin syndrome.  Avoid dextromethorphan, tramadol  which can increase risk. Continue alprazolam  XR 0.5 mg every 12 hours Continue paroxetine  80 mg daily Increase Abilify  20 mg daily..   Continue propranolol  20 mg tablets 1-2 twice daily as needed anxiety,  but consider clonidine  Continue topiramate  50 mg tablets 2 twice daily Continue trazodone  100 mg nightly Continue zolpidem  5 to 10 mg nightly as needed insomnia.  Don't take when on call. She typically will alternate trazodone  or Ambien  depending on her work schedule.  This appt was 30 mins.  FU 2  mos  Lorene Macintosh, MD, DFAPA     Please see After Visit Summary for patient specific instructions.  No future appointments.    No orders of the defined types were placed in this encounter.     -------------------------------

## 2024-05-22 ENCOUNTER — Other Ambulatory Visit (HOSPITAL_BASED_OUTPATIENT_CLINIC_OR_DEPARTMENT_OTHER): Payer: Self-pay

## 2024-05-22 ENCOUNTER — Other Ambulatory Visit: Payer: Self-pay

## 2024-05-22 ENCOUNTER — Other Ambulatory Visit: Payer: Self-pay | Admitting: Psychiatry

## 2024-05-22 DIAGNOSIS — F411 Generalized anxiety disorder: Secondary | ICD-10-CM

## 2024-05-22 MED ORDER — TOPIRAMATE 50 MG PO TABS
100.0000 mg | ORAL_TABLET | Freq: Two times a day (BID) | ORAL | 0 refills | Status: AC
  Filled 2024-05-22: qty 360, 90d supply, fill #0

## 2024-07-04 ENCOUNTER — Ambulatory Visit (INDEPENDENT_AMBULATORY_CARE_PROVIDER_SITE_OTHER): Admitting: Psychiatry
